# Patient Record
Sex: Female | Born: 1945
Health system: Southern US, Community
[De-identification: ages and names within clinical notes are randomized; demographics above are authoritative.]

## PROBLEM LIST (undated history)

## (undated) DIAGNOSIS — M8580 Other specified disorders of bone density and structure, unspecified site: Secondary | ICD-10-CM

## (undated) DIAGNOSIS — R51 Headache: Secondary | ICD-10-CM

## (undated) DIAGNOSIS — M545 Low back pain, unspecified: Secondary | ICD-10-CM

## (undated) DIAGNOSIS — F419 Anxiety disorder, unspecified: Secondary | ICD-10-CM

## (undated) DIAGNOSIS — M1990 Unspecified osteoarthritis, unspecified site: Secondary | ICD-10-CM

## (undated) DIAGNOSIS — E039 Hypothyroidism, unspecified: Secondary | ICD-10-CM

## (undated) DIAGNOSIS — I509 Heart failure, unspecified: Secondary | ICD-10-CM

## (undated) DIAGNOSIS — G8929 Other chronic pain: Secondary | ICD-10-CM

## (undated) DIAGNOSIS — G4730 Sleep apnea, unspecified: Secondary | ICD-10-CM

## (undated) DIAGNOSIS — G4700 Insomnia, unspecified: Secondary | ICD-10-CM

## (undated) DIAGNOSIS — G43909 Migraine, unspecified, not intractable, without status migrainosus: Secondary | ICD-10-CM

## (undated) DIAGNOSIS — R413 Other amnesia: Secondary | ICD-10-CM

## (undated) DIAGNOSIS — Z8719 Personal history of other diseases of the digestive system: Secondary | ICD-10-CM

## (undated) DIAGNOSIS — Z8489 Family history of other specified conditions: Secondary | ICD-10-CM

## (undated) DIAGNOSIS — M549 Dorsalgia, unspecified: Secondary | ICD-10-CM

## (undated) DIAGNOSIS — J45909 Unspecified asthma, uncomplicated: Secondary | ICD-10-CM

## (undated) DIAGNOSIS — F99 Mental disorder, not otherwise specified: Secondary | ICD-10-CM

## (undated) DIAGNOSIS — N200 Calculus of kidney: Secondary | ICD-10-CM

## (undated) DIAGNOSIS — E785 Hyperlipidemia, unspecified: Secondary | ICD-10-CM

## (undated) DIAGNOSIS — E119 Type 2 diabetes mellitus without complications: Secondary | ICD-10-CM

## (undated) DIAGNOSIS — I10 Essential (primary) hypertension: Secondary | ICD-10-CM

## (undated) DIAGNOSIS — G14 Postpolio syndrome: Secondary | ICD-10-CM

## (undated) DIAGNOSIS — I209 Angina pectoris, unspecified: Secondary | ICD-10-CM

## (undated) DIAGNOSIS — K219 Gastro-esophageal reflux disease without esophagitis: Secondary | ICD-10-CM

## (undated) DIAGNOSIS — I5181 Takotsubo syndrome: Secondary | ICD-10-CM

## (undated) HISTORY — DX: Takotsubo syndrome: I51.81

## (undated) HISTORY — PX: TUBAL LIGATION: SHX77

## (undated) HISTORY — DX: Other amnesia: R41.3

## (undated) HISTORY — PX: CARPAL TUNNEL RELEASE: SHX101

## (undated) HISTORY — PX: COLONOSCOPY: SHX174

## (undated) HISTORY — PX: WISDOM TOOTH EXTRACTION: SHX21

## (undated) HISTORY — PX: KNEE ARTHROSCOPY: SUR90

## (undated) HISTORY — DX: Angina pectoris, unspecified: I20.9

## (undated) HISTORY — PX: ABDOMINAL HYSTERECTOMY: SHX81

## (undated) HISTORY — PX: WRIST TENDON TRANSFER: SHX2676

## (undated) HISTORY — PX: APPENDECTOMY: SHX54

## (undated) HISTORY — PX: CHOLECYSTECTOMY: SHX55

## (undated) HISTORY — PX: KNEE SURGERY: SHX244

## (undated) HISTORY — PX: CYSTOSCOPY: SUR368

## (undated) HISTORY — PX: LEG SURGERY: SHX1003

---

## 1997-09-03 ENCOUNTER — Encounter: Admission: RE | Admit: 1997-09-03 | Discharge: 1997-12-02 | Payer: Self-pay | Admitting: Internal Medicine

## 1998-04-06 ENCOUNTER — Ambulatory Visit (HOSPITAL_COMMUNITY): Admission: RE | Admit: 1998-04-06 | Discharge: 1998-04-06 | Payer: Self-pay

## 1998-09-28 ENCOUNTER — Ambulatory Visit (HOSPITAL_COMMUNITY): Admission: RE | Admit: 1998-09-28 | Discharge: 1998-09-28 | Payer: Self-pay | Admitting: Orthopedic Surgery

## 1998-09-28 ENCOUNTER — Encounter: Payer: Self-pay | Admitting: Orthopedic Surgery

## 1998-10-12 ENCOUNTER — Encounter: Payer: Self-pay | Admitting: Orthopedic Surgery

## 1998-10-12 ENCOUNTER — Ambulatory Visit (HOSPITAL_COMMUNITY): Admission: RE | Admit: 1998-10-12 | Discharge: 1998-10-12 | Payer: Self-pay | Admitting: Orthopedic Surgery

## 1998-10-27 ENCOUNTER — Encounter: Payer: Self-pay | Admitting: Orthopedic Surgery

## 1998-10-27 ENCOUNTER — Ambulatory Visit (HOSPITAL_COMMUNITY): Admission: RE | Admit: 1998-10-27 | Discharge: 1998-10-27 | Payer: Self-pay | Admitting: Orthopedic Surgery

## 1998-12-27 ENCOUNTER — Encounter (INDEPENDENT_AMBULATORY_CARE_PROVIDER_SITE_OTHER): Payer: Self-pay

## 1998-12-27 ENCOUNTER — Ambulatory Visit (HOSPITAL_COMMUNITY): Admission: RE | Admit: 1998-12-27 | Discharge: 1998-12-27 | Payer: Self-pay | Admitting: *Deleted

## 1999-02-16 ENCOUNTER — Emergency Department (HOSPITAL_COMMUNITY): Admission: EM | Admit: 1999-02-16 | Discharge: 1999-02-16 | Payer: Self-pay | Admitting: Internal Medicine

## 1999-03-24 ENCOUNTER — Ambulatory Visit (HOSPITAL_COMMUNITY): Admission: RE | Admit: 1999-03-24 | Discharge: 1999-03-24 | Payer: Self-pay | Admitting: *Deleted

## 1999-03-28 ENCOUNTER — Inpatient Hospital Stay (HOSPITAL_COMMUNITY): Admission: EM | Admit: 1999-03-28 | Discharge: 1999-04-01 | Payer: Self-pay | Admitting: Orthopedic Surgery

## 1999-03-30 ENCOUNTER — Encounter: Payer: Self-pay | Admitting: Orthopedic Surgery

## 1999-12-16 ENCOUNTER — Encounter: Admission: RE | Admit: 1999-12-16 | Discharge: 1999-12-16 | Payer: Self-pay | Admitting: Urology

## 1999-12-16 ENCOUNTER — Encounter: Payer: Self-pay | Admitting: Urology

## 2000-01-20 ENCOUNTER — Encounter: Payer: Self-pay | Admitting: Internal Medicine

## 2000-01-20 ENCOUNTER — Encounter: Admission: RE | Admit: 2000-01-20 | Discharge: 2000-01-20 | Payer: Self-pay | Admitting: Internal Medicine

## 2000-03-12 ENCOUNTER — Encounter: Payer: Self-pay | Admitting: Emergency Medicine

## 2000-03-12 ENCOUNTER — Emergency Department (HOSPITAL_COMMUNITY): Admission: EM | Admit: 2000-03-12 | Discharge: 2000-03-12 | Payer: Self-pay | Admitting: Emergency Medicine

## 2001-01-03 ENCOUNTER — Encounter: Admission: RE | Admit: 2001-01-03 | Discharge: 2001-01-03 | Payer: Self-pay | Admitting: Internal Medicine

## 2001-01-03 ENCOUNTER — Encounter: Payer: Self-pay | Admitting: Internal Medicine

## 2001-03-29 ENCOUNTER — Emergency Department (HOSPITAL_COMMUNITY): Admission: EM | Admit: 2001-03-29 | Discharge: 2001-03-29 | Payer: Self-pay | Admitting: Emergency Medicine

## 2001-03-29 ENCOUNTER — Encounter: Payer: Self-pay | Admitting: Emergency Medicine

## 2001-06-01 ENCOUNTER — Encounter: Payer: Self-pay | Admitting: Emergency Medicine

## 2001-06-01 ENCOUNTER — Emergency Department (HOSPITAL_COMMUNITY): Admission: EM | Admit: 2001-06-01 | Discharge: 2001-06-01 | Payer: Self-pay | Admitting: Emergency Medicine

## 2001-09-04 ENCOUNTER — Emergency Department (HOSPITAL_COMMUNITY): Admission: EM | Admit: 2001-09-04 | Discharge: 2001-09-05 | Payer: Self-pay | Admitting: Emergency Medicine

## 2001-09-05 ENCOUNTER — Encounter: Payer: Self-pay | Admitting: Emergency Medicine

## 2002-01-07 ENCOUNTER — Encounter: Admission: RE | Admit: 2002-01-07 | Discharge: 2002-01-07 | Payer: Self-pay | Admitting: Internal Medicine

## 2002-01-07 ENCOUNTER — Encounter: Payer: Self-pay | Admitting: Internal Medicine

## 2002-11-25 ENCOUNTER — Encounter: Payer: Self-pay | Admitting: Internal Medicine

## 2002-11-25 ENCOUNTER — Encounter: Admission: RE | Admit: 2002-11-25 | Discharge: 2002-11-25 | Payer: Self-pay | Admitting: Internal Medicine

## 2003-02-06 ENCOUNTER — Ambulatory Visit (HOSPITAL_COMMUNITY): Admission: RE | Admit: 2003-02-06 | Discharge: 2003-02-06 | Payer: Self-pay | Admitting: *Deleted

## 2003-11-27 ENCOUNTER — Encounter: Admission: RE | Admit: 2003-11-27 | Discharge: 2003-11-27 | Payer: Self-pay | Admitting: Obstetrics and Gynecology

## 2004-10-17 ENCOUNTER — Ambulatory Visit: Payer: Self-pay | Admitting: Hematology and Oncology

## 2004-12-13 ENCOUNTER — Encounter: Admission: RE | Admit: 2004-12-13 | Discharge: 2004-12-13 | Payer: Self-pay | Admitting: Obstetrics and Gynecology

## 2004-12-19 ENCOUNTER — Encounter: Admission: RE | Admit: 2004-12-19 | Discharge: 2004-12-19 | Payer: Self-pay | Admitting: Orthopedic Surgery

## 2004-12-25 ENCOUNTER — Encounter: Admission: RE | Admit: 2004-12-25 | Discharge: 2004-12-25 | Payer: Self-pay | Admitting: Orthopedic Surgery

## 2006-01-16 ENCOUNTER — Encounter: Admission: RE | Admit: 2006-01-16 | Discharge: 2006-01-16 | Payer: Self-pay | Admitting: Obstetrics and Gynecology

## 2006-03-13 ENCOUNTER — Encounter: Admission: RE | Admit: 2006-03-13 | Discharge: 2006-03-13 | Payer: Self-pay | Admitting: Orthopedic Surgery

## 2007-02-04 ENCOUNTER — Encounter: Admission: RE | Admit: 2007-02-04 | Discharge: 2007-02-04 | Payer: Self-pay | Admitting: Obstetrics and Gynecology

## 2007-09-27 ENCOUNTER — Encounter: Admission: RE | Admit: 2007-09-27 | Discharge: 2007-09-27 | Payer: Self-pay | Admitting: Orthopedic Surgery

## 2007-10-29 ENCOUNTER — Encounter: Admission: RE | Admit: 2007-10-29 | Discharge: 2007-10-29 | Payer: Self-pay | Admitting: Orthopedic Surgery

## 2008-02-13 ENCOUNTER — Encounter: Admission: RE | Admit: 2008-02-13 | Discharge: 2008-02-13 | Payer: Self-pay | Admitting: Obstetrics and Gynecology

## 2008-07-18 ENCOUNTER — Inpatient Hospital Stay (HOSPITAL_COMMUNITY): Admission: AD | Admit: 2008-07-18 | Discharge: 2008-07-18 | Payer: Self-pay | Admitting: Obstetrics and Gynecology

## 2009-02-04 ENCOUNTER — Inpatient Hospital Stay (HOSPITAL_COMMUNITY): Admission: EM | Admit: 2009-02-04 | Discharge: 2009-02-06 | Payer: Self-pay | Admitting: Emergency Medicine

## 2009-02-04 ENCOUNTER — Ambulatory Visit: Payer: Self-pay | Admitting: Cardiology

## 2009-02-08 ENCOUNTER — Telehealth: Payer: Self-pay | Admitting: Cardiology

## 2009-02-15 ENCOUNTER — Encounter: Admission: RE | Admit: 2009-02-15 | Discharge: 2009-02-15 | Payer: Self-pay | Admitting: Obstetrics and Gynecology

## 2009-03-12 ENCOUNTER — Ambulatory Visit: Payer: Self-pay | Admitting: Cardiology

## 2009-03-12 DIAGNOSIS — I10 Essential (primary) hypertension: Secondary | ICD-10-CM | POA: Insufficient documentation

## 2009-03-12 DIAGNOSIS — E119 Type 2 diabetes mellitus without complications: Secondary | ICD-10-CM | POA: Insufficient documentation

## 2009-03-12 DIAGNOSIS — I4891 Unspecified atrial fibrillation: Secondary | ICD-10-CM | POA: Insufficient documentation

## 2009-03-12 DIAGNOSIS — E039 Hypothyroidism, unspecified: Secondary | ICD-10-CM | POA: Insufficient documentation

## 2009-03-12 DIAGNOSIS — I5181 Takotsubo syndrome: Secondary | ICD-10-CM | POA: Insufficient documentation

## 2009-03-12 DIAGNOSIS — R609 Edema, unspecified: Secondary | ICD-10-CM | POA: Insufficient documentation

## 2009-03-15 ENCOUNTER — Encounter: Admission: RE | Admit: 2009-03-15 | Discharge: 2009-03-15 | Payer: Self-pay | Admitting: Gastroenterology

## 2009-03-23 ENCOUNTER — Encounter: Payer: Self-pay | Admitting: Cardiology

## 2009-04-14 ENCOUNTER — Telehealth: Payer: Self-pay | Admitting: Cardiology

## 2009-04-26 ENCOUNTER — Encounter: Payer: Self-pay | Admitting: Cardiology

## 2009-05-03 ENCOUNTER — Ambulatory Visit: Payer: Self-pay | Admitting: Cardiology

## 2009-07-08 ENCOUNTER — Telehealth (INDEPENDENT_AMBULATORY_CARE_PROVIDER_SITE_OTHER): Payer: Self-pay | Admitting: *Deleted

## 2009-08-02 ENCOUNTER — Ambulatory Visit: Payer: Self-pay | Admitting: Cardiology

## 2010-01-24 ENCOUNTER — Ambulatory Visit: Payer: Self-pay | Admitting: Cardiology

## 2010-01-24 ENCOUNTER — Encounter: Payer: Self-pay | Admitting: Cardiology

## 2010-01-24 ENCOUNTER — Emergency Department (HOSPITAL_COMMUNITY): Admission: EM | Admit: 2010-01-24 | Discharge: 2010-01-24 | Payer: Self-pay | Admitting: Emergency Medicine

## 2010-01-31 ENCOUNTER — Ambulatory Visit: Payer: Self-pay | Admitting: Internal Medicine

## 2010-01-31 ENCOUNTER — Encounter: Payer: Self-pay | Admitting: Cardiology

## 2010-01-31 ENCOUNTER — Ambulatory Visit: Payer: Self-pay

## 2010-01-31 ENCOUNTER — Ambulatory Visit: Payer: Self-pay | Admitting: Cardiology

## 2010-01-31 ENCOUNTER — Ambulatory Visit (HOSPITAL_COMMUNITY): Admission: RE | Admit: 2010-01-31 | Discharge: 2010-01-31 | Payer: Self-pay | Admitting: Cardiology

## 2010-02-24 ENCOUNTER — Encounter: Admission: RE | Admit: 2010-02-24 | Discharge: 2010-02-24 | Payer: Self-pay | Admitting: Obstetrics and Gynecology

## 2010-03-31 ENCOUNTER — Telehealth: Payer: Self-pay | Admitting: Cardiology

## 2010-06-04 ENCOUNTER — Encounter: Payer: Self-pay | Admitting: Obstetrics and Gynecology

## 2010-06-04 ENCOUNTER — Inpatient Hospital Stay (HOSPITAL_COMMUNITY)
Admission: EM | Admit: 2010-06-04 | Discharge: 2010-06-13 | Payer: Self-pay | Source: Home / Self Care | Attending: Internal Medicine | Admitting: Internal Medicine

## 2010-06-05 ENCOUNTER — Encounter: Payer: Self-pay | Admitting: Orthopedic Surgery

## 2010-06-05 ENCOUNTER — Encounter: Payer: Self-pay | Admitting: Obstetrics and Gynecology

## 2010-06-07 LAB — URINALYSIS, ROUTINE W REFLEX MICROSCOPIC
Ketones, ur: NEGATIVE mg/dL
Specific Gravity, Urine: 1.007 (ref 1.005–1.030)
Urine Glucose, Fasting: NEGATIVE mg/dL
Urobilinogen, UA: 0.2 mg/dL (ref 0.0–1.0)

## 2010-06-07 LAB — GLUCOSE, CAPILLARY
Glucose-Capillary: 116 mg/dL — ABNORMAL HIGH (ref 70–99)
Glucose-Capillary: 121 mg/dL — ABNORMAL HIGH (ref 70–99)
Glucose-Capillary: 135 mg/dL — ABNORMAL HIGH (ref 70–99)
Glucose-Capillary: 144 mg/dL — ABNORMAL HIGH (ref 70–99)
Glucose-Capillary: 126 mg/dL — ABNORMAL HIGH (ref 70–99)
Glucose-Capillary: 142 mg/dL — ABNORMAL HIGH (ref 70–99)
Glucose-Capillary: 154 mg/dL — ABNORMAL HIGH (ref 70–99)

## 2010-06-07 LAB — COMPREHENSIVE METABOLIC PANEL
ALT: 22 U/L (ref 0–35)
ALT: 22 U/L (ref 0–35)
AST: 25 U/L (ref 0–37)
Albumin: 2.3 g/dL — ABNORMAL LOW (ref 3.5–5.2)
Alkaline Phosphatase: 50 U/L (ref 39–117)
Calcium: 8.2 mg/dL — ABNORMAL LOW (ref 8.4–10.5)
Creatinine, Ser: 0.89 mg/dL (ref 0.4–1.2)
Creatinine, Ser: 0.44 mg/dL (ref 0.4–1.2)
GFR calc Af Amer: >60 mL/min (ref >60)
GFR calc Af Amer: >60 mL/min (ref >60)
GFR calc non Af Amer: >60 mL/min (ref >60)
Potassium: 3.8 mEq/L (ref 3.5–5.1)
Sodium: 132 mEq/L — ABNORMAL LOW (ref 135–145)
Sodium: 138 mEq/L (ref 135–145)
Total Bilirubin: 1.3 mg/dL — ABNORMAL HIGH (ref 0.3–1.2)
Total Protein: 5 g/dL — ABNORMAL LOW (ref 6.0–8.3)

## 2010-06-07 LAB — CBC
HCT: 30 % — ABNORMAL LOW (ref 36.0–46.0)
Hemoglobin: 10.4 g/dL — ABNORMAL LOW (ref 12.0–15.0)
MCH: 30.5 pg (ref 26.0–34.0)
MCH: 30.1 pg (ref 26.0–34.0)
MCHC: 33.6 g/dL (ref 30.0–36.0)
MCHC: 33.3 g/dL (ref 30.0–36.0)
Platelets: 304 10*3/uL (ref 150–400)
Platelets: 278 10*3/uL (ref 150–400)
Platelets: 282 10*3/uL (ref 150–400)
RBC: 3.41 MIL/uL — ABNORMAL LOW (ref 3.87–5.11)
RDW: 13.7 % (ref 11.5–15.5)
RDW: 13.9 % (ref 11.5–15.5)
WBC: 5.7 10*3/uL (ref 4.0–10.5)

## 2010-06-07 LAB — CARDIAC PANEL(CRET KIN+CKTOT+MB+TROPI)
CK, MB: 16 ng/mL (ref 0.3–4.0)
CK, MB: 3.6 ng/mL (ref 0.3–4.0)
CK, MB: 7.5 ng/mL (ref 0.3–4.0)
CK, MB: 1 ng/mL (ref 0.3–4.0)
CK, MB: 1.2 ng/mL (ref 0.3–4.0)
Relative Index: 1.5 (ref 0.0–2.5)
Relative Index: 0.5 (ref 0.0–2.5)
Relative Index: 0.6 (ref 0.0–2.5)
Relative Index: 0.6 (ref 0.0–2.5)
Total CK: 1036 U/L — ABNORMAL HIGH (ref 7–177)
Total CK: 568 U/L — ABNORMAL HIGH (ref 7–177)
Total CK: 801 U/L — ABNORMAL HIGH (ref 7–177)
Total CK: 179 U/L — ABNORMAL HIGH (ref 7–177)
Total CK: 228 U/L — ABNORMAL HIGH (ref 7–177)
Troponin I: 0.03 ng/mL (ref 0.00–0.06)
Troponin I: 0.02 ng/mL (ref 0.00–0.06)

## 2010-06-07 LAB — MAGNESIUM: Magnesium: 1.8 mg/dL (ref 1.5–2.5)

## 2010-06-07 LAB — RAPID URINE DRUG SCREEN, HOSP PERFORMED
Barbiturates: NOT DETECTED
Tetrahydrocannabinol: NOT DETECTED

## 2010-06-07 LAB — BASIC METABOLIC PANEL
BUN: 6 mg/dL (ref 6–23)
Calcium: 8 mg/dL — ABNORMAL LOW (ref 8.4–10.5)
GFR calc non Af Amer: >60 mL/min (ref >60)
Glucose, Bld: 126 mg/dL — ABNORMAL HIGH (ref 70–99)
Potassium: 3.4 mEq/L — ABNORMAL LOW (ref 3.5–5.1)
Sodium: 138 mEq/L (ref 135–145)

## 2010-06-07 LAB — DIFFERENTIAL
Basophils Absolute: 0 10*3/uL (ref 0.0–0.1)
Basophils Relative: 0 % (ref 0–1)
Eosinophils Relative: 4 % (ref 0–5)
Monocytes Relative: 15 % — ABNORMAL HIGH (ref 3–12)
Neutrophils Relative %: 64 % (ref 43–77)

## 2010-06-07 LAB — HEMOGLOBIN A1C: Mean Plasma Glucose: 103 mg/dL (ref <117)

## 2010-06-07 LAB — ETHANOL: Alcohol, Ethyl (B): 5 mg/dL (ref 0–10)

## 2010-06-07 LAB — PHOSPHORUS: Phosphorus: 2.1 mg/dL — ABNORMAL LOW (ref 2.3–4.6)

## 2010-06-08 LAB — PROTIME-INR: INR: 1.17 (ref 0.00–1.49)

## 2010-06-08 LAB — GLUCOSE, CAPILLARY
Glucose-Capillary: 129 mg/dL — ABNORMAL HIGH (ref 70–99)
Glucose-Capillary: 120 mg/dL — ABNORMAL HIGH (ref 70–99)
Glucose-Capillary: 119 mg/dL — ABNORMAL HIGH (ref 70–99)
Glucose-Capillary: 100 mg/dL — ABNORMAL HIGH (ref 70–99)
Glucose-Capillary: 142 mg/dL — ABNORMAL HIGH (ref 70–99)
Glucose-Capillary: 100 mg/dL — ABNORMAL HIGH (ref 70–99)
Glucose-Capillary: 131 mg/dL — ABNORMAL HIGH (ref 70–99)
Glucose-Capillary: 96 mg/dL (ref 70–99)

## 2010-06-08 LAB — CBC
HCT: 28.7 % — ABNORMAL LOW (ref 36.0–46.0)
MCHC: 33.1 g/dL (ref 30.0–36.0)
MCV: 91.1 fL (ref 78.0–100.0)
Platelets: 330 10*3/uL (ref 150–400)
RDW: 14 % (ref 11.5–15.5)

## 2010-06-08 LAB — MRSA PCR SCREENING: MRSA by PCR: NEGATIVE

## 2010-06-08 LAB — COMPREHENSIVE METABOLIC PANEL
Albumin: 2.3 g/dL — ABNORMAL LOW (ref 3.5–5.2)
BUN: 5 mg/dL — ABNORMAL LOW (ref 6–23)
Calcium: 8.4 mg/dL (ref 8.4–10.5)
Glucose, Bld: 111 mg/dL — ABNORMAL HIGH (ref 70–99)
Sodium: 138 mEq/L (ref 135–145)
Total Protein: 5.1 g/dL — ABNORMAL LOW (ref 6.0–8.3)

## 2010-06-08 LAB — BASIC METABOLIC PANEL
CO2: 26 mEq/L (ref 19–32)
Chloride: 109 mEq/L (ref 96–112)
Creatinine, Ser: 0.47 mg/dL (ref 0.4–1.2)
GFR calc Af Amer: >60 mL/min (ref >60)
Potassium: 4.1 mEq/L (ref 3.5–5.1)

## 2010-06-08 LAB — MAGNESIUM: Magnesium: 1.6 mg/dL (ref 1.5–2.5)

## 2010-06-09 LAB — GLUCOSE, CAPILLARY
Glucose-Capillary: 101 mg/dL — ABNORMAL HIGH (ref 70–99)
Glucose-Capillary: 114 mg/dL — ABNORMAL HIGH (ref 70–99)
Glucose-Capillary: 116 mg/dL — ABNORMAL HIGH (ref 70–99)
Glucose-Capillary: 124 mg/dL — ABNORMAL HIGH (ref 70–99)
Glucose-Capillary: 97 mg/dL (ref 70–99)

## 2010-06-09 NOTE — Consult Note (Addendum)
[name], [name] NO.:  [id number]  MEDICAL RECORD NO.:  [id number]          PATIENT TYPE:  INP  LOCATION:  1402                         FACILITY:  [facility]  PHYSICIAN:  Rollene Rotunda, MD, FACCDATE OF BIRTH:  1945/10/11  DATE OF CONSULTATION: DATE OF DISCHARGE:                                CONSULTATION   REQUESTING PHYSICIAN:  Troy Sine. Dwain Sarna, M.D.  REASON FOR CONSULTATION:  Preoperative.  There is a question of ventricular tachycardia.  She had previous cardiomyopathy.  HISTORY OF PRESENT ILLNESS:  The patient is a pleasant, 65 year old who I have seen in the past for a cardiomyopathy.  She has had an ejection fraction of about 45% documented previously.  She had a nonobstructive coronary disease in September 2010.  Most recent echo done outside of this hospitalization in September of last year demonstrated the EF to be back to normal.  She was admitted with altered mental status probably related to narcotics she was taking for dental extraction.  She was also somewhat dehydrated.  She had a fall and was actually run over by her own wheelchair.  On presentation, she has also been noted to have signs and symptoms consistent with cholecystitis.  A CT and ultrasound have confirmed this.  She is to have cholecystectomy.  However, on telemetry, the patient was felt to have ventricular tachycardia.  I reviewed all available alarm and active telemetry strips.  However, it is clear that what was thought to be ventricular tachycardia was actually artifact. She has had sinus rhythm or sinus brady throughout with no arrhythmias. Of note, the patient did have elevated CK but with a normal troponin and negative MB ratio during this hospitalization.  This is consistent with muscular trauma.  She has also had another echocardiogram demonstrating a well-preserved ejection fraction and no significant abnormalities.  The patient gets around in a wheelchair because  of polio.  She has had no recent cardiovascular symptoms.  She denies any chest pressure, neck or arm discomfort.  She has had no palpitations, presyncope or syncope. She has had no PND or orthopnea.  She is currently complaining of some neck discomfort, but this is her right posterior neck and there is a point of tenderness and some discomfort with moving.  She did report a fall out of her wheelchair recently.  PAST MEDICAL HISTORY:  Takotsubo cardiomyopathy, resolved, hypertension, diabetes mellitus, nonobstructive coronary disease (40% LAD stenosis, catheterization September 2010), hypothyroidism, polio.  PAST SURGICAL HISTORY:  Hysterectomy, appendectomy.  ALLERGIES:  Intolerances ACETAMINOPHEN, NONSTEROIDALS, SOMA, COX-2 INHIBITORS, CIPROFLOXACIN.  MEDICATIONS:  (Prior to admission), Dexilant, Synthroid 80 mcg daily, hydrocodone, metoprolol 12.5 mg daily, metformin 500 mg b.i.d., losartan 50 mg daily, vitamin D, Percocet, tizanidine, vitamin B12, clonazepam, citalopram, Benadryl, Ambien, furosemide 20 mg daily, fish oil, multivitamins. SOCIAL HISTORY:  The patient is married.  She currently does not smoke cigarettes or drink alcohol.  FAMILY HISTORY:  Contributory for her father having coronary disease in his 29s and a brother with aortic valve replacement bypass in his 87s.  REVIEW OF SYSTEMS:  As stated in the HPI and positive for chronic  back pain.  Negative for all other systems.  PHYSICAL EXAMINATION:  The patient is pleasant and in no distress. Blood pressure 125/74, heart rate 63, respiratory rate 18, 95% saturation on room air, afebrile. HEENT:  Eyes are unremarkable, pupils are equal, round, and react to light, fundi not visualized, oral mucosa unremarkable. NECK:  No jugular distention at 45 degrees, carotid upstroke brisk and symmetrical.  No bruits, no thyromegaly.  LYMPHATICS:  No cervical, axillary or inguinal lymphadenopathy. LUNGS:  Clear to auscultation  bilaterally. BACK:  No costovertebral tenderness. CHEST:  Unremarkable. HEART:  PMI not displaced or sustained, S1-S2 within normal limits, no S3, no S4, no clicks, no rubs, no murmurs. ABDOMEN:  Flat, positive bowel sounds normal in frequency and pitch, no bruits, no rebound, no guarding, mild tenderness to palpation in the right upper quadrant. SKIN:  No rashes, no nodules. EXTREMITIES:  2+ pulses, mild edema in lower extremities. NEURO:  Oriented to person, place, and time, cranial nerves grossly intact, motor grossly intact in upper, paralysis in lower extremities bilaterally.  DIAGNOSTIC STUDIES:  EKG on January 23 sinus bradycardia, rate 51, low voltage in the limb leads and chest leads, axis within normal limits, intervals within normal limits, poor anterior R-wave progression, no acute ST-T wave changes.  LABORATORY DATA:  TSH 3.87, phosphorus 2.9, magnesium 1.5, CK 130. Sodium 38, potassium 4.3, BUN 5, creatinine 0.44, WBC 6.2, hemoglobin 9.5, platelets 330.  ASSESSMENT/PLAN: 1. Preoperative clearance.  The patient is at acceptable risk for the     planned surgery.  She has had no new symptoms since her most recent     cardiac evaluation, which was a catheterization in 2010.  Her     ejection fraction has improved.  There are no high-risk findings.     Given this, according to ACC/AHA guidelines, no further evaluation     is indicated prior to the indicated surgery. 2. Cardiomyopathy.  This has not improved to a normal ejection     fraction.  No change in therapy is indicated. 3. Questionable arrhythmia. The telemetry strip that was labeled as     ventricular tachycardia demonstrates artifact.  There have been no     arrhythmias, and she is not high risk for these.  No further     therapy is warranted.     Rollene Rotunda, MD, [facility]     JH/MEDQ  D:  06/07/2010  T:  06/07/2010  Job:  [id number]  Electronically Signed by Rollene Rotunda MD [facility] on 06/09/2010 12:41:36  PM

## 2010-06-10 LAB — COMPREHENSIVE METABOLIC PANEL
ALT: 22 U/L (ref 0–35)
AST: 13 U/L (ref 0–37)
Albumin: 2.4 g/dL — ABNORMAL LOW (ref 3.5–5.2)
Alkaline Phosphatase: 56 U/L (ref 39–117)
Chloride: 110 mEq/L (ref 96–112)
GFR calc Af Amer: >60 mL/min (ref >60)
Potassium: 3.8 mEq/L (ref 3.5–5.1)
Total Bilirubin: 0.5 mg/dL (ref 0.3–1.2)

## 2010-06-10 LAB — CBC
MCV: 90.9 fL (ref 78.0–100.0)
Platelets: 395 10*3/uL (ref 150–400)
RBC: 3.4 MIL/uL — ABNORMAL LOW (ref 3.87–5.11)
WBC: 8.6 10*3/uL (ref 4.0–10.5)

## 2010-06-10 LAB — GLUCOSE, CAPILLARY
Glucose-Capillary: 132 mg/dL — ABNORMAL HIGH (ref 70–99)
Glucose-Capillary: 127 mg/dL — ABNORMAL HIGH (ref 70–99)

## 2010-06-11 LAB — BASIC METABOLIC PANEL
BUN: 5 mg/dL — ABNORMAL LOW (ref 6–23)
GFR calc non Af Amer: >60 mL/min (ref >60)
Potassium: 4.1 mEq/L (ref 3.5–5.1)
Sodium: 140 mEq/L (ref 135–145)

## 2010-06-11 LAB — GLUCOSE, CAPILLARY
Glucose-Capillary: 87 mg/dL (ref 70–99)
Glucose-Capillary: 117 mg/dL — ABNORMAL HIGH (ref 70–99)

## 2010-06-11 LAB — MAGNESIUM: Magnesium: 1.5 mg/dL (ref 1.5–2.5)

## 2010-06-12 LAB — GLUCOSE, CAPILLARY
Glucose-Capillary: 137 mg/dL — ABNORMAL HIGH (ref 70–99)
Glucose-Capillary: 90 mg/dL (ref 70–99)

## 2010-06-12 LAB — BASIC METABOLIC PANEL
CO2: 29 mEq/L (ref 19–32)
Chloride: 105 mEq/L (ref 96–112)
Creatinine, Ser: 0.49 mg/dL (ref 0.4–1.2)
GFR calc Af Amer: >60 mL/min (ref >60)
Potassium: 4 mEq/L (ref 3.5–5.1)

## 2010-06-13 LAB — GLUCOSE, CAPILLARY
Glucose-Capillary: 145 mg/dL — ABNORMAL HIGH (ref 70–99)
Glucose-Capillary: 107 mg/dL — ABNORMAL HIGH (ref 70–99)

## 2010-06-13 NOTE — Op Note (Signed)
[name], [name] NO.:  [id number]  MEDICAL RECORD NO.:  [id number]          PATIENT TYPE:  INP  LOCATION:  1402                         FACILITY:  [facility]  PHYSICIAN:  Juanetta Gosling, MDDATE OF BIRTH:  [birth year]  DATE OF PROCEDURE: DATE OF DISCHARGE:                              OPERATIVE REPORT   PREOPERATIVE DIAGNOSIS:  Chronic cholecystitis.  POSTOPERATIVE DIAGNOSIS:  Acute on chronic cholecystitis.  PROCEDURE:  Laparoscopic cholecystectomy with cholangiogram.  SURGEON:  Juanetta Gosling, MD  ASSISTANT:  Letha Cape, PA.  ANESTHESIA:  General.  SPECIMENS:  Gallbladder contents to pathology.  ESTIMATED BLOOD LOSS:  80 mL.  COMPLICATIONS:  None.  DRAINS:  None.  DISPOSITION:  To recovery room in stable condition.  INDICATIONS:  This is a 65 year old female who was admitted by Dr. Bertram Savin over the weekend after she had a fall as well as some confusion. She did complain of some abdominal pain and nausea, which prompted a CT abdomen and pelvis, which showed possible acute cholecystitis.  This was followed up by an abdominal ultrasound showing sludge and some gallbladder wall thickening.  Clinically, she had cholecystitis on my examination as well.  Prior to taking to the operating room yesterday, she had a questionable run of ventricular tachycardia.  She had been evaluated by Cardiology and this was deemed to be an artifact and there is no reason to hold her up for surgery at this point.  She and I and her family discussed laparoscopic cholecystectomy along with the risks and benefits associated with that procedure.  After informed consent, the patient was taken to the operating room.  She was on Zosyn on the floor, already received this preoperatively.  Sequential compression devices were placed on lower extremities prior to induction of anesthesia.  She was then placed under general endotracheal anesthesia without  complication.  Her abdomen was then prepped and draped in standard sterile surgical fashion.  Surgical time-out was then performed.  I infiltrated 0.5% Marcaine below her umbilicus.  I made a vertical incision, carried this down to her fascia.  This was entered sharply. Peritoneum was entered bluntly.  An 0 Vicryl pursestring suture was placed through the fascia.  Hasson trocar was introduced and the abdomen was then insufflated to 15 mmHg pressure.  Three further 5-mm trocars were placed in the epigastrium and right upper quadrant under direct vision after infiltration with local anesthetic without complication. The gallbladder was noted to be adherent to the duodenum and the omentum.  The adhesions were taken down bluntly.  She very clearly had what appeared to be acute on chronic cholecystitis.  With some difficulty, gallbladder was retracted cephalad lateral.  It was quite difficult to actually dissect the triangle of Calot.  I then was able to obtain what I thought was a critical view of safety.  I did clip the artery and divided that.  I was unsure of exactly my location in the cystic duct and elected to perform a cholangiogram.  I clipped this distally, introduced a Cook catheter in the abdomen and made a ductotomy.  I milked some  hazy-looking fluid from the duct.  I was able to place the catheter in the duct and did a cholangiogram that showed my presence in the cystic duct.  She had a long cystic duct.  There was easy filling of her duodenum.  There was backfilling into her liver and the common bile duct was a little bit dilated but there was no obvious stones present.  Following this, I removed the catheter, clipped the duct three times, and divided this.  I then removed the gallbladder bed from the liver with some difficulty.  I entered the gallbladder and there was leakage of some bile.  There were no stones present.  The top portion of the gallbladder was unable to separate  from the liver and I left a little bit of the back wall of the gallbladder on the liver.  The gallbladder was then removed from the liver, was placed in an EndoCatch bag and removed.  I then bovied the mucosa that I left on the liver as well as obtained hemostasis.  Copious irrigation was performed until this was clear.  I did place a piece of Surgicel snow in the gallbladder bed.  I then removed the Hasson trocar.  I viewed this from the epigastrium, the pursestring was tied down and this obliterated the defect.  I then desufflated the abdomen and removed all trocars.  The trocar sites were all closed with 4-0 Monocryl in subcuticular fashion. Dermabond was placed on them.  She tolerated this well, was extubated in the operating room and transferred to the recovery room in stable condition.     Juanetta Gosling, MD     MCW/MEDQ  D:  06/08/2010  T:  06/08/2010  Job:  [id number]  cc:   Soyla Murphy. Renne Crigler, M.D. Fax: [phone]  Electronically Signed by Emelia Loron MD on 06/13/2010 11:31:25 AM

## 2010-06-14 NOTE — Assessment & Plan Note (Signed)
Summary: per check out/sf   Visit Type:  Follow-up Primary Provider:  Merri Brunette, MD  CC:  Cardiomyopathy.  History of Present Illness: The patient presents for followup of her cardiomyopathy. Since I last saw her she has had no new cardiovascular complaints. The swelling in her leg is much improved. I did increase her lisinopril at the last visit. She has done well with this though I do see some blood pressures in the 90s on her blood pressure diary. She feels fatigued on these days. She is not describing presyncope or syncope. She is not describing chest pressure, neck or arm discomfort. She's had no new palpitations.  Problems Prior to Update: 1)  Edema  (ICD-782.3) 2)  Takotsubo Syndrome  (ICD-429.83) 3)  Atrial Fibrillation  (ICD-427.31) 4)  Hypothyroidism  (ICD-244.9) 5)  Dm  (ICD-250.00) 6)  Hypertension  (ICD-401.9)  Current Medications (verified): 1)  Tizanidine Hcl 4 Mg Tabs (Tizanidine Hcl) .... 5 Daily 2)  Metformin Hcl 500 Mg Tabs (Metformin Hcl) .... 2 Two Times A Day 3)  Simvastatin 20 Mg Tabs (Simvastatin) .... Daily 4)  Zolpidem Tartrate 10 Mg Tabs (Zolpidem Tartrate) .... At Bedtime 5)  Synthroid 88 Mcg Tabs (Levothyroxine Sodium) .... Daily 6)  Lisinopril 10 Mg Tabs (Lisinopril) .... Daily 7)  Dexilant 60 Mg Cpdr (Dexlansoprazole) .... Daily 8)  Triamterene-Hctz 37.5-25 Mg Tabs (Triamterene-Hctz) .... 1/2 Daily 9)  Caltrate 600+d 600-400 Mg-Unit Tabs (Calcium Carbonate-Vitamin D) .... Three Times A Day 10)  Fish Oil 500 Mg Caps (Omega-3 Fatty Acids) .... 4 Daily 11)  Multivitamins   Tabs (Multiple Vitamin) .... Daily 12)  Evening Primrose Oil 500 Mg Caps (Evening Primrose Oil) .... Daily 13)  Grape Seed  Oil (Grape Seed) .... 2 Two Times A Day 14)  Citalopram Hydrobromide 40 Mg Tabs (Citalopram Hydrobromide) .... Daily 15)  Clonazepam 0.5 Mg Tabs (Clonazepam) .... Two Times A Day 16)  Fosamax 70 Mg Tabs (Alendronate Sodium) .... Weekly 17)  Metoprolol  Succinate 25 Mg Xr24h-Tab (Metoprolol Succinate) .... Daily 18)  Furosemide 20 Mg Tabs (Furosemide) .... One By Mouth Qam 19)  Vitamin B-12 100 Mcg Tabs (Cyanocobalamin) .Marland Kitchen.. 1 By Mouth Daily 20)  Vitamin D (Ergocalciferol) 50000 Unit Caps (Ergocalciferol) .Marland Kitchen.. 1 By Mouth Every Month  Allergies: 1)  ! Tylenol 2)  ! * Anti Inflammitory 3)  ! * Sulma Compound 4)  ! * Telectin 5)  ! Darvocet  Past History:  Past Medical History: Reviewed history from 03/12/2009 and no changes required.  1. Hypertension.   2. Diabetes mellitus type 2.   3. Poliomyelitis with right lower extremity paralysis, is wheelchair       bound.   4. Hypothyroidism.   5. Cardiomyopathy (EF 45%)  6. Nonobstructive coronary disease  Past Surgical History: Reviewed history from 03/12/2009 and no changes required.  1. Hysterectomy.   2. Appendectomy.   Review of Systems       As stated in the HPI and negative for all other systems.   Vital Signs:  Patient profile:   65 year old female Height:      60 inches Weight:      180 pounds BMI:     35.28 Pulse rate:   61 / minute BP sitting:   130 / 61  (left arm) Cuff size:   large  Vitals Entered By: Oswald Hillock (August 02, 2009 3:29 PM)  Physical Exam  General:  Well developed, well nourished, in no acute distress. Head:  normocephalic and  atraumatic Neck:  no jugular venous distention at 90 Lungs:  Clear bilaterally to auscultation and percussion. Heart:  S1 and S2 within normal limits, no S3, no S4, no clicks, no rubs, no murmurs Abdomen:  Positive bowel sounds normal in frequency and pH, no rebound or guarding, unable to appreciate organomegaly secondary to the patient being seated in the wheelchair Msk:  normal upper extremity strength, severe with bilateral lower extremity weakness Pulses:  pulses normal in all 4 extremities Extremities:  no edema Neurologic:  Alert and oriented x 3. Psych:  Normal affect.   EKG  Procedure date:   08/02/2009  Findings:      sinus rhythm, rate 61, axis within normal limits, intervals within normal limits, no acute ST-T wave changes.  Impression & Recommendations:  Problem # 1:  TAKOTSUBO SYNDROME (ICD-429.83) The patient has no new symptoms. I cannot up titrate her medications. She will remain on meds as listed. In fact she will hold her beta blocker on days when her blood pressure is low. I plan on repeating an echocardiogram in September at the year anniversary of her diagnosis to see if her ejection fraction has improved.  Problem # 2:  ATRIAL FIBRILLATION (ICD-427.31) She has had no evidence of recurrence of this. No change in therapy is indicated. Orders: EKG w/ Interpretation (93000)  Problem # 3:  HYPERTENSION (ICD-401.9) Her blood pressure is actually low. The meds will be administered as directed above.  Patient Instructions: 1)  Your physician recommends that you schedule a follow-up appointment in: 01/2010 with 2 D Echo for 428.22 2)  Your physician recommends that you continue on your current medications as directed. Please refer to the Current Medication list given to you today. 3)  Your physician has requested that you have an echocardiogram.  Echocardiography is a painless test that uses sound waves to create images of your heart. It provides your doctor with information about the size and shape of your heart and how well your heart's chambers and valves are working.  This procedure takes approximately one hour. There are no restrictions for this procedure.

## 2010-06-14 NOTE — Consult Note (Signed)
Summary: [facility]   [place]   Imported By: Roderic Ovens 03/09/2010 12:32:31  _____________________________________________________________________  External Attachment:    Type:   Image     Comment:   External Document

## 2010-06-14 NOTE — Assessment & Plan Note (Signed)
Summary: 6 month rov need echo aslo   Visit Type:  Follow-up Primary Provider:  Merri Brunette, MD  CC:  Cardiomyopathy.  History of Present Illness: The patient presents for followup of her mild cardiomyopathy demonstrated on catheterization last year. She's had lower extremity edema but we've treated this conservatively with a low dose of diuretic and she has done a great job keeping her feet elevated. She was in the emergency room on the 12th of this month with shoulder back and arm pain. She was seen in consultation by our service but it was felt to be noncardiac. Her BNP was less than 30 and cardiac enzymes were normal. Chest x-ray demonstrated no abnormalities. It was felt to be musculoskeletal. Since that time she's had some short fleeting discomfort across her anterior chest. She has not had any shortness of breath, PND or orthopnea. She has not had any chest pressure, neck or arm discomfort consistent with angina. She's had no palpitations, presyncope or syncope.  Current Medications (verified): 1)  Tizanidine Hcl 4 Mg Tabs (Tizanidine Hcl) .... 5 Daily 2)  Metformin Hcl 500 Mg Tabs (Metformin Hcl) .... 2 Two Times A Day 3)  Simvastatin 40 Mg Tabs (Simvastatin) .... 1/2 By Mouth Daily 4)  Zolpidem Tartrate 10 Mg Tabs (Zolpidem Tartrate) .... At Bedtime 5)  Synthroid 88 Mcg Tabs (Levothyroxine Sodium) .... Daily 6)  Lisinopril 10 Mg Tabs (Lisinopril) .... Daily 7)  Dexilant 60 Mg Cpdr (Dexlansoprazole) .... Daily 8)  Triamterene-Hctz 37.5-25 Mg Tabs (Triamterene-Hctz) .... 1/2 Daily 9)  Caltrate 600+d 600-400 Mg-Unit Tabs (Calcium Carbonate-Vitamin D) .... Three Times A Day 10)  Fish Oil 500 Mg Caps (Omega-3 Fatty Acids) .... 4 Daily 11)  Multivitamins   Tabs (Multiple Vitamin) .... Daily 12)  Evening Primrose Oil 500 Mg Caps (Evening Primrose Oil) .... Daily 13)  Grape Seed  Oil (Grape Seed) .... 2 Two Times A Day 14)  Citalopram Hydrobromide 40 Mg Tabs (Citalopram Hydrobromide) ....  Daily 15)  Clonazepam 0.5 Mg Tabs (Clonazepam) .... Two Times A Day 16)  Fosamax 70 Mg Tabs (Alendronate Sodium) .... Weekly 17)  Metoprolol Succinate 25 Mg Xr24h-Tab (Metoprolol Succinate) .... 1/2 By Mouth Daily 18)  Furosemide 20 Mg Tabs (Furosemide) .... One By Mouth Qam 19)  Vitamin B-12 100 Mcg Tabs (Cyanocobalamin) .Marland Kitchen.. 1 By Mouth Daily 20)  Vitamin D (Ergocalciferol) 50000 Unit Caps (Ergocalciferol) .Marland Kitchen.. 1 By Mouth Every Month 21)  Gelnique 10 % Gel (Oxybutynin Chloride) .... As Directed 22)  Aspirin 81 Mg  Tabs (Aspirin) .Marland Kitchen.. 1 By Mouth Daily  Allergies (verified): 1)  ! Tylenol 2)  ! * Anti Inflammitory 3)  ! * Sulma Compound 4)  ! * Telectin 5)  ! Darvocet  Past History:  Past Medical History: Reviewed history from 03/12/2009 and no changes required.  1. Hypertension.   2. Diabetes mellitus type 2.   3. Poliomyelitis with right lower extremity paralysis, is wheelchair       bound.   4. Hypothyroidism.   5. Cardiomyopathy (EF 45%)  6. Nonobstructive coronary disease  Past Surgical History: Reviewed history from 03/12/2009 and no changes required.  1. Hysterectomy.   2. Appendectomy.   Review of Systems       As stated in the HPI and negative for all other systems.   Vital Signs:  Patient profile:   65 year old female Height:      60 inches Weight:      173 pounds BMI:  33.91 Pulse rate:   67 / minute Resp:     16 per minute BP sitting:   134 / 69  (left arm)  Vitals Entered By: Marrion Coy, CNA (January 31, 2010 2:04 PM)  Physical Exam  General:  Well developed, well nourished, in no acute distress. Head:  normocephalic and atraumatic Neck:  no jugular venous distention at 90 Lungs:  Clear bilaterally to auscultation and percussion. Heart:  S1 and S2 within normal limits, no S3, no S4, no clicks, no rubs, no murmurs Abdomen:  Positive bowel sounds normal in frequency and pitch, no rebound or guarding, unable to appreciate organomegaly  secondary to the patient being seated in the wheelchair Msk:  normal upper extremity strength, severe with bilateral lower extremity weakness Pulses:  pulses normal in all 4 extremities Extremities:  trace edema Neurologic:  Alert and oriented x 3. Skin:  Intact without lesions or rashes. Cervical Nodes:  no significant adenopathy Psych:  Normal affect.   EKG  Procedure date:  01/31/2010  Findings:      Sinus rhythm, rate 67, axis within normal limits, intervals within normal limits, no acute ST-T wave changes  Impression & Recommendations:  Problem # 1:  TAKOTSUBO SYNDROME (ICD-429.83) She had a repeat echo today with results pending. I suspect her ejection fraction looks good. At this point I will make no change to her medical regimen pending the results.  Problem # 2:  EDEMA (ICD-782.3) Assessment: Unchanged This is much improved with conservative measures. No change in therapy is indicated.  Problem # 3:  HYPERTENSION (ICD-401.9) Her blood pressure is controlled. She will continue the meds as listed.  Patient Instructions: 1)  Your physician recommends that you schedule a follow-up appointment in: 12 months 2)  Your physician recommends that you continue on your current medications as directed. Please refer to the Current Medication list given to you today.

## 2010-06-14 NOTE — H&P (Signed)
NAMEARZU, MCGAUGHEY NO.:  [id number]  MEDICAL RECORD NO.:  [id number]          PATIENT TYPE:  INP  LOCATION:  0102                         FACILITY:  [facility]  PHYSICIAN:  Thad Ranger, MD       DATE OF BIRTH:  1945-12-12  DATE OF ADMISSION:  06/04/2010 DATE OF DISCHARGE:                             HISTORY & PHYSICAL   PRIMARY CARE PHYSICIAN:  Soyla Murphy. Renne Crigler, MD  DENTIST:  Lyndal Pulley. Chales Salmon, MD, dental surgeon.  CARDIOLOGIST:  Rollene Rotunda, MD, [facility]  CHIEF COMPLAINT:  Altered mental status and weakness.  HISTORY OF PRESENT ILLNESS:  Ms. Palecek is a 66 year old female with history of CHF, hypertension, chronic back pain, diabetes, GERD, hyperlipidemia, polio, presented to the [facility] Emergency Room with a chief complaint of confusion, altered mental status, and weakness. History was provided by the patient and her spouse present in the room. Per the patient, she had dental extractions done on Tuesday, January 17th, 4 days ago.  Per the patient, she was taking the oxycodone, acetaminophen tablets 10/325 mg initially every 3 to 4 hours as needed for the pain.  However, her pain after the dental extraction continued to worsen.  Per the patient, her dentist recommended her to take 2 tablets every 4 hours.  The patient stated that her pain was 7/10, hence she took the oxycodone as recommended.  However, the patient's husband stated that she may have taken more oxycodones.  The patient stated that she felt somewhat confused and weak yesterday.  She was unable to push herself up to get to the wheelchair.  She had a fall and fell on her hips.  Since yesterday, she has been having pain in the stomach area as well in the left lower quadrant after the fall as well as the hip.  The patient states that she is wheelchair dependent due to history of polio and the back injury.  The patient denies any suicidal ideation and any suicide attempt.  She informs that she was  taking the pain medication due to her recent dental work and pain.  PAST MEDICAL HISTORY: 1. History of CHF.  However, no echocardiogram on record.  The     patient's cardiologist is Dr. Antoine Poche. 2. Hypertension. 3. History of chronic back pain. 4. Diabetes mellitus type 2. 5. GERD. 6. Hyperlipidemia. 7. History of polio.  SOCIAL HISTORY:  The patient denies any smoking, alcohol or any drug use.  She currently lives at home with her husband and is wheelchair bound.  She states that in her house she has a ramp for the wheelchair.  ALLERGIES:  TYLENOL, SOMA, and TOLECTIN.  PHYSICAL EXAMINATION:  VITAL SIGNS:  Blood pressure 98/54, pulse rate 68, respiratory rate 16, temperature 98.3. GENERAL:  The patient is currently alert, not in any acute distress.  At the time of arrival in the emergency room, the patient was found to be lethargic and sleepy and received 2 mg of Narcan. HEENT:  Anicteric sclerae.  Pale conjunctivae.  Pupils reactive to light and accommodation.  EOMI.  Slightly dry mucosal membranes NECK:  Supple. No lymphadenopathy.  No JVD. CVS: S1, S2 clear.  Regular rate and rhythm. CHEST:  Clear to auscultation bilaterally. ABDOMEN:  Soft.  Mild left lower quadrant tenderness.  No rebound tenderness or guarding.  Normal bowel sounds. EXTREMITIES:  No cyanosis, clubbing, or edema noted in upper or lower extremities.  Some petechiae and bruising on the left lower extremity because of the recent fall.  LABORATORY DIAGNOSTIC DATA:  Urinalysis negative for any UTI.  CBC showed white count of 11.0, hemoglobin 10.4, hematocrit 30.0, platelets 304.  Urine drug screen positive for benzodiazepine, acetaminophen less than 10.  CMP, sodium 132, potassium 3.8, BUN 19, creatinine 0.8, total bilirubin 1.3.  Alcohol level 5.  Aspirin less than 1.  RADIOLOGICAL DATA:  CT head without contrast, no evidence of acute intracranial abnormality.  IMPRESSION AND PLAN:  Ms. Parsell is a  65 year old female with history of chronic back pain, diabetes, history of hypertension, congestive heart failure, who had a recent dental work up and was placed on narcotics for the pain, presents with altered mental status, which has improved after the Narcan in the emergency room. 1. Altered mental status with accidental overdose of oxycodone.  The     patient denies any suicide attempt.  She has improved after the     Narcan 2 mg in the emergency room.  The patient will be placed on     observation status on the tele monitor.  I will taper her     hydrocodone, acetaminophen to p.r.n. as needed to assess the     requirement for the pain.  I personally do not think she needs any     psychiatry consult at this time as this appears to be accidental     overdose. 2. Dehydration with weakness and falls.  Possibly secondary to     narcotic effect as well as the patient has not been eating well due     to the recent dental work 4 days ago.  The patient will be placed     on gentle hydration and will also obtain PT, OT consult. 3. Hypotension, likely secondary to dehydration.  Placed on IV fluids,     hold metoprolol and all other antihypertensives for now. 4. Diabetes mellitus.  I will place the patient on sliding scale     insulin as she has not been eating well.  Hold metformin for now     until the blood sugars are well controlled. 5. Prophylaxis.  Bilateral sequential compression devices for deep     venous thrombosis prophylaxis.     Thad Ranger, MD     RR/MEDQ  D:  06/04/2010  T:  06/04/2010  Job:  [id number]  cc:   Soyla Murphy. Renne Crigler, M.D. Fax: [phone]  Electronically Signed by Kedra Mcglade  on 06/14/2010 04:52:48 PM

## 2010-06-14 NOTE — Progress Notes (Signed)
Summary: BP  Phone Note Outgoing Call   Call placed by: Sander Nephew, RN Call placed to: Patient Details for Reason: Blood Pressure Summary of Call: yesterday BP was 76/54 HR 63, 121/63  HR 63 today 126/100 HR 66 and 117/66  has been feeling really nervous.  Still having nervous feeling, no sob or chest pain. Instructed pt to keep a BP log over the next several days and call to report them.  If her BP should remain lower than 100/50 she should call before then.  Pt states understanding and has an appt upcoming Initial call taken by: Charolotte Capuchin, RN,  July 08, 2009 5:57 PM

## 2010-06-14 NOTE — Progress Notes (Signed)
Summary: questions about d/x  Phone Note Call from Patient Call back at Home Phone [phone]   Caller: Patient Summary of Call: Pt have question about her d/x Initial call taken by: Judie Grieve,  March 31, 2010 2:04 PM  Follow-up for Phone Call        supposed to have injection in her back tomorrow - has paperwork to be filled out.  Pt had questions about her medications and diagonsis.  Reviewed medications and dx with pt and answered all questions. She requests a copy of her medication list be mailed to her which will be done today.   She denied any other quetions and/or needs Follow-up by: Charolotte Capuchin, RN,  March 31, 2010 3:20 PM      Patient Instructions: 1)  Your physician recommends that you continue on your current medications as directed. Please refer to the Current Medication list given to you today.

## 2010-06-14 NOTE — Consult Note (Signed)
Summary: [name]   Imported By: Marylou Mccoy 05/25/2009 17:09:02  _____________________________________________________________________  External Attachment:    Type:   Image     Comment:   External Document

## 2010-06-19 NOTE — Discharge Summary (Signed)
NAMEKARLE, DESROSIER NO.:  [id number]  MEDICAL RECORD NO.:  [id number]          PATIENT TYPE:  INP  LOCATION:  1402                         FACILITY:  [facility]  PHYSICIAN:  Conley Canal, MD      DATE OF BIRTH:  07/31/1945  DATE OF ADMISSION:  06/04/2010 DATE OF DISCHARGE:                        DISCHARGE SUMMARY - REFERRING   PRIMARY CARE PHYSICIAN:  Soyla Murphy. Renne Crigler, MD  DENTIST:  Lyndal Pulley. Chales Salmon, MD  CARDIOLOGIST:  Rollene Rotunda, MD, [facility]  CONSULTING PHYSICIANS:  Lennie Muckle, MD, [facility] Rollene Rotunda, MD, [facility]  PROBLEM LIST: 1. Acute delirium, resolved most likely multifactorial including     narcotics ongoing infection status post dental extraction. 2. Acute acalculous cholecystitis. 3. Takotsubo cardiomyopathy, resolved. 4. Essential hypertension. 5. Nonobstructive coronary artery disease status post cardiac     catheterization in September 2010 showing 40% LAD stenosis. 6. Hypothyroidism.  PROCEDURES PERFORMED: 1. CT of the brain without contrast on June 04, 2010 no evidence     for acute intracranial abnormality. 2. CT abdomen and pelvis with a contrast showed findings consistent     with acute cholecystitis.  No evidence of biliary ductal     dilatation.  No other significant findings. 3. Pelvic x-rays on June 04, 2010 showed no acute findings. 4. Abdominal ultrasound on June 06, 2010 showed biliary sludge with     some gallbladder wall thickening.  No gallstones evident and no     evidence of biliary dilatation. 5. A 2-D echocardiogram on June 06, 2010 showed EF 55% to 60% with     normal wall motion, mild mitral regurgitation and peak pulmonary     arterial pressure of 39 mmHg.  HOSPITAL COURSE:  This pleasant 65 year old lady with comorbidities as noted above came to the hospital with generalized weakness and acute confusion, which was thought to be related to narcotics.  The patient was receiving status  post dental extraction.  The patient also complained of some abdominal pain and nausea, prompting CT abdomen and pelvis, which suggested an acute cholecystitis with a followup abdominal ultrasound showing gallbladder sludge and some abdominal wall thickening.  The patient is being followed by [facility], input appreciated.  The plan is for laparoscopic cholecystectomy on June 08, 2010.  Because of the patient's cardiac history, cardiology was consulted and Dr. Rollene Rotunda graciously saw the patient and felt that she had acceptable risk to proceed with surgery.  Otherwise, today the patient complains of right upper quadrant pain.  Some occasional shortness of breath.  PHYSICAL EXAMINATION:  GENERAL:  She is not in acute distress. VITAL SIGNS:  Blood pressure 125/74, heart rate of 63, temperature 97.9, respirations 20, oxygen saturation 95% on room air.  HEAD, EARS, EYES, NOSE, AND Throat:  No jugular venous distention.  No carotid bruits. She has missing teeth. RESPIRATORY:  Good air entry bilaterally with no rhonchi, rales, or wheezes. CARDIOVASCULAR:  S1 and S2 heart sounds heard.  No murmurs.  Pulse regular. ABDOMEN:  Soft, some tenderness to deep palpation right upper quadrant with deep inspiration.  Bowel sounds normal. CNS: Patient  seem a little anxious, otherwise alert, oriented in person, place, and time with no acute focal neurological deficits.  EXTREMITIES: No pedal edema.  LABORATORY DATA:  WBC 6.2, hemoglobin 9.5, hematocrit 28.7, platelet count 330, sodium 138, potassium 4.3, BUN 5, creatinine 0.44.  LFTs unremarkable.  Magnesium 1.5, phosphorus 2.9, TSH 3.874.  Cardiac enzymes significant for CPK elevated, improved with rehydration.  PLAN: 1. Acute acalculous cholecystitis.  Plan is for laparoscopic     cholecystectomy on June 08, 2010, n.p.o. past midnight, surgical     input appreciated. 2. Nonobstructive coronary artery disease.  Takotsubo  cardiomyopathy,     no evidence of decompensation.  Cardiology input appreciated.     Follow cardiology recommendations. 3. Depression, stable.  Continue Celexa. 4. Hypomagnesemia, hypokalemia secondary to poor oral intake,     replenish as necessary. 5. Hypothyroidism, stable.  Continue Synthroid. 6. Deep venous thrombosis prophylaxis, SCDs. 7. Gastrointestinal prophylaxis, proton pump inhibitors.     Conley Canal, MD     SR/MEDQ  D:  06/07/2010  T:  06/07/2010  Job:  [id number]  Electronically Signed by Conley Canal  on 06/19/2010 09:28:35 PM

## 2010-06-23 NOTE — Discharge Summary (Signed)
[name], [name] NO.:  [id number]  MEDICAL RECORD NO.:  [id number]          PATIENT TYPE:  INP  LOCATION:  1402                         FACILITY:  [facility]  PHYSICIAN:  Calvert Cantor, M.D.     DATE OF BIRTH:  10-20-1945  DATE OF ADMISSION:  06/04/2010 DATE OF DISCHARGE:  06/13/2010                              DISCHARGE SUMMARY   PRIMARY CARE PHYSICIAN:  Soyla Murphy. Renne Crigler, M.D.  DENTIST:  Lyndal Pulley. Chales Salmon, M.D.  CARDIOLOGIST:  Rollene Rotunda, MD, [facility]  Please see discharge summary that was dictated on June 07, 2010 by Dr. Venetia Constable, job [phone].  The patient was not discharged on January 25 as planned.  Apparently, when her oxygen was taken off, she was noted to be hypoxic.  This was found to be secondary to pleural effusions.  The patient is chronically on diuretics but apparently this was not noted on admission.  According to the med rec either.  Also, the patient was confused on admission and therefore none of her medications could be confirmed with her.  Due to this, diuretics were not reinstated. Eventually, she developed some fluid overload.  She was diuresed and is now back to her baseline and is no longer requiring O2.  DISCHARGE DIAGNOSES: 1. Acute delirium, exact cause uncertain; however, it is suspected     that this was secondary to the narcotics she received after her     dental extraction 2. Acute acalculous cholecystitis status post laparoscopic     cholecystectomy on June 08, 2010.  PAST MEDICAL HISTORY: 1. Takotsubo cardiomyopathy. 2. Hypertension. 3. Nonobstructive coronary artery disease. 4. Hypothyroidism.  DISCHARGE MEDICATIONS: 1. Oxycodone 5 mg 1-2 tablets q.4 h. as needed for pain, prescription     has been written by Letha Cape for Dr. Dwain Sarna.  She has     given her a prescription for 40 tablets. 2. Losartan 50 mg p.o. daily. 3. Citalopram 40 mg p.o. daily. 4. Fish oil 1 g 2 capsules daily. 5. Ambien 10 mg  q.h.s. 6. Clonazepam 0.5 mg b.i.d. p.r.n. 7. Metoprolol XL 25 mg p.o. daily. 8. Metformin 500 mg p.o. b.i.d. 9. Tizanidine 4 mg p.o. q.i.d. as needed. 10.Benadryl 25 mg p.o. q.6 h. as needed for itching. 11.Furosemide 20 mg p.o. daily. 12.Multivitamin 1 tablet p.o. daily. 13.Nitroglycerin 0.4 mg q. 5 minutes up to 3 doses as needed for chest     pain. 14.She may still have prescriptions for Percocet and hydrocodone at     home.  Anyhow for now, she will be taking oxycodone and should stop     all other narcotics. 15.Maxzide 75/50 half tablet daily. 16.Dexilant 60 mg p.o. daily. 17.Calcium citrate with vitamin D 600/400 three tablets at bedtime. 18.Synthroid 88 mcg daily. 19.Vitamin B12 OTC 1 tablet daily. 20.Vitamin D2 50,000 units q. monthly.  HOSPITAL COURSE:  For full detailed hospital course, please see the discharge summary dictated by Dr. Venetia Constable.  PHYSICAL EXAMINATION:  RESPIRATORY:  Today, lungs clear with normal respiratory effort.  No use of accessory muscles. HEART:  Regular rate and rhythm.  No murmurs. ABDOMEN:  Soft,  nontender, nondistended.  Bowel sounds positive. Incisions from lap chole healing well. EXTREMITIES:  No cyanosis, clubbing or edema.  The patient has been evaluated by physical therapy.  Apparently, she spends most of her time in a wheelchair and only stands when she is moving from wheelchair to her bed.  Therefore, she does not need any physical therapy.  FOLLOWUP INSTRUCTIONS: 1. She is to follow up with Surgery, an appointment has been given for     her on February 7 at 2:45 p.m. 2. She needs to follow up with her primary care physician, Dr. Merri Brunette, in 1-2 weeks.  TIME ON DISCHARGE:  60 minutes.     Calvert Cantor, M.D.     SR/MEDQ  D:  06/13/2010  T:  06/13/2010  Job:  [id number]  cc:   Soyla Murphy. Renne Crigler, M.D. Fax: [phone]  Electronically Signed by Calvert Cantor M.D. on 06/23/2010 12:31:59 PM

## 2010-06-30 NOTE — Consult Note (Addendum)
[name], [name] NO.:  [id number]  MEDICAL RECORD NO.:  [id number]          PATIENT TYPE:  INP  LOCATION:  1402                         FACILITY:  [facility]  PHYSICIAN:  Lennie Muckle, MD      DATE OF BIRTH:  January 17, 1946  DATE OF CONSULTATION:  06/05/2010 DATE OF DISCHARGE:                                CONSULTATION   REQUESTING PHYSICIANS: 1. [facility]. 2. Everett Graff, NP  REASON FOR CONSULTATION:  Question of acute cholecystitis.  CHIEF COMPLAINT:  Status post fall.  [name] is a 65 year old female who was apparently admitted for altered mental status yesterday.  She states she has had some dental work approximately a week ago, was taking oxycodone, had taken several high doses of oxycodone, became confused and fell from her wheelchair. She is confined to her wheelchair due to her polio.  When she fell, she stuck her abdomen.  She came in the emergency department due to her confusion and abdominal pain.  Due to her abdominal pain, she has had a CT scan for evaluation.  On the CT scan, there was question of acute cholecystitis due to inflammatory changes and wall thickening on the gallbladder.  She reports not having any history of right upper quadrant abdominal pain.  She has not had any nausea or vomiting.  She had a bowel movement yesterday.  She has not had any episodes of jaundice, pain after eating.  She does have chronic back pain as well as reflux disease.  She is on Dexilant, which pretty much controls her reflux disease.  She states several years ago, she had a workup by Dr. Randa Evens including an upper endoscopy.  I do not have these reports and she did not recall having any incidental findings other than the reflux disease. She apparently had a slight elevation of her bilirubin upon admission with a bilirubin of 1.3.  Liver enzymes were normal, AST of 34, ALT of 22, alkaline phosphatase of 50.  White count was mildly elevated at  11, hemoglobin and hematocrit 10 and 30.  Her CBC this morning is normal at 5.7.  She states she had some mild abdominal pain all over her abdomen, this is worse so in the epigastric region.  Her pain is worse when she moves or tries to shift up in the sitting position.  She has to use her upper extremity to help move her body due to her polio.  PAST MEDICAL HISTORY:  Significant for: 1. Congestive heart failure. 2. Hypertension. 3. Chronic back pain. 4. Polio. 5. Reflux disease. 6. Hyperlipidemia. 7. Diabetes.  PAST SURGICAL HISTORY:  She had a surgery on her left knee.  No abdominal surgery.  CURRENT MEDICATIONS AT HOME:  Include tizanidine, simvastatin, metoprolol, zolpidem, pen VK, oxycodone, Benadryl, Lasix, Synthroid, metformin, losartan, triamterene, Nitrostat, vitamin B12.  ALLERGIES:  Include INVANZ and TOLECTIN.  SOCIAL HISTORY:  No tobacco use.  No alcohol.  She lives with herself. She is confined to her wheelchair.  REVIEW OF SYSTEMS:  Negative other than the HPI.  PHYSICAL EXAMINATION:  GENERAL:  She is pleasant, obese female  lying in bed in no acute distress. VITAL SIGNS:  Temperature 97.9, pulse 66, blood pressure 120/74. HEENT:  Head is normocephalic.  Extraocular muscles are intact.  Pupils are equal and round.  Sclerae, conjunctivae are clear.  Nares are clear without drainage.  Oral mucosa is somewhat pale.  Dentition is poor.  No drainage is noted.  Oral mucosa is moist. NECK:  Normal range of motion.  Trachea is midline.  Thyroid without apparent abnormalities.  No pain with palpation. CHEST:  Clear to auscultation bilaterally.  Normal expansion, excursion. CARDIOVASCULAR:  Regular rate and rhythm.  She does have edema in the lower extremity.  Pulses are palpated on the upper extremity. ABDOMEN:  Obese, soft, mildly tender over all quadrants, but no significant tenderness in the right upper quadrant.  No masses.  No organomegaly.  Bowel sounds are  auscultated. SKIN:  Warm to touch.  There is no jaundice.  No rashes. MUSCULOSKELETAL:  No deformities.  She does have some edema in the lower extremities, incisional scars on the left knee.  The left leg appears slightly larger than the right leg. NEUROLOGIC:  She is unable to move the bilateral lower extremities, although she does have sensation to touch bilaterally. PSYCHOLOGIC:  Normal.  IMAGING:  CT scan was reviewed.  There were mild inflammatory changes around the gallbladder, somewhat distended.  No pericholecystic fluid. Bilirubin upon admission was 1.3, other labs are normal.  ASSESSMENT/PLAN:  Likely incidental finding on the CT scan after obtaining for traumatic reasons.  Suspect that she might have chronic cholecystitis, but doubtful she has acute cholecystitis given her clinical history and examination.  She does have a history of reflux disease.  Given the findings on the CT, I would recommend a followup ultrasound just to make sure that this indeed was an incidental finding. I suspect that this study would likely be negative or only reveal chronic cholecystitis, which is not an acute surgical need.  I would doubt that she would need further studies nor she would need surgery on her gallbladder.  I have discussed this with the patient that her studies are presently reviewed.  I will have Dr. Jomarie Longs for the ultrasound.  I will order a soft mechanical crab-modified diet after the ultrasound since I suspect that this will have to be negative.  If the ultrasound is indeed negative, there is not really a need to follow up with the surgery.  Thank you very much for allowing me to participate in the care of [name].     Lennie Muckle, MD     ALA/MEDQ  D:  06/05/2010  T:  06/06/2010  Job:  [id number]  Electronically Signed by Bertram Savin MD on 06/30/2010 02:16:39 PM

## 2010-07-28 LAB — CBC
MCHC: 34 g/dL (ref 30.0–36.0)
MCV: 88.4 fL (ref 78.0–100.0)
Platelets: 357 10*3/uL (ref 150–400)
RDW: 13.5 % (ref 11.5–15.5)
WBC: 8 10*3/uL (ref 4.0–10.5)

## 2010-07-28 LAB — POCT CARDIAC MARKERS
CKMB, poc: <1 ng/mL — ABNORMAL LOW (ref 1.0–8.0)
CKMB, poc: <1 ng/mL — ABNORMAL LOW (ref 1.0–8.0)
Myoglobin, poc: 32.5 ng/mL (ref 12–200)
Troponin i, poc: <0.05 ng/mL (ref 0.00–0.09)
Troponin i, poc: <0.05 ng/mL (ref 0.00–0.09)

## 2010-07-28 LAB — BASIC METABOLIC PANEL
BUN: 35 mg/dL — ABNORMAL HIGH (ref 6–23)
Creatinine, Ser: 1 mg/dL (ref 0.4–1.2)
GFR calc non Af Amer: 55 mL/min — ABNORMAL LOW (ref >60)
Glucose, Bld: 86 mg/dL (ref 70–99)

## 2010-07-28 LAB — DIFFERENTIAL
Basophils Absolute: 0.1 10*3/uL (ref 0.0–0.1)
Basophils Relative: 1 % (ref 0–1)
Eosinophils Relative: 8 % — ABNORMAL HIGH (ref 0–5)
Lymphocytes Relative: 36 % (ref 12–46)
Neutro Abs: 3.6 10*3/uL (ref 1.7–7.7)

## 2010-07-28 LAB — BRAIN NATRIURETIC PEPTIDE: Pro B Natriuretic peptide (BNP): <30 pg/mL (ref 0.0–100.0)

## 2010-07-28 LAB — PROTIME-INR: Prothrombin Time: 12.4 seconds (ref 11.6–15.2)

## 2010-08-19 LAB — BASIC METABOLIC PANEL
BUN: 14 mg/dL (ref 6–23)
CO2: 26 mEq/L (ref 19–32)
Calcium: 8.9 mg/dL (ref 8.4–10.5)
Chloride: 100 mEq/L (ref 96–112)
Creatinine, Ser: 0.65 mg/dL (ref 0.4–1.2)
GFR calc Af Amer: >60 mL/min (ref >60)
GFR calc non Af Amer: >60 mL/min (ref >60)
Glucose, Bld: 121 mg/dL — ABNORMAL HIGH (ref 70–99)
Glucose, Bld: 92 mg/dL (ref 70–99)
Potassium: 4.2 mEq/L (ref 3.5–5.1)
Sodium: 134 mEq/L — ABNORMAL LOW (ref 135–145)

## 2010-08-19 LAB — CBC
HCT: 33.2 % — ABNORMAL LOW (ref 36.0–46.0)
HCT: 34.5 % — ABNORMAL LOW (ref 36.0–46.0)
Hemoglobin: 11.3 g/dL — ABNORMAL LOW (ref 12.0–15.0)
Hemoglobin: 11.8 g/dL — ABNORMAL LOW (ref 12.0–15.0)
MCHC: 34.2 g/dL (ref 30.0–36.0)
MCHC: 34.3 g/dL (ref 30.0–36.0)
MCV: 93.2 fL (ref 78.0–100.0)
MCV: 93.5 fL (ref 78.0–100.0)
Platelets: 313 10*3/uL (ref 150–400)
Platelets: 352 10*3/uL (ref 150–400)
Platelets: 359 10*3/uL (ref 150–400)
RBC: 3.56 MIL/uL — ABNORMAL LOW (ref 3.87–5.11)
RBC: 3.69 MIL/uL — ABNORMAL LOW (ref 3.87–5.11)
RDW: 12.7 % (ref 11.5–15.5)
RDW: 12.9 % (ref 11.5–15.5)
RDW: 13.4 % (ref 11.5–15.5)
WBC: 10.6 10*3/uL — ABNORMAL HIGH (ref 4.0–10.5)
WBC: 11.5 10*3/uL — ABNORMAL HIGH (ref 4.0–10.5)
WBC: 7.6 10*3/uL (ref 4.0–10.5)

## 2010-08-19 LAB — HEPATIC FUNCTION PANEL
ALT: 41 U/L — ABNORMAL HIGH (ref 0–35)
AST: 42 U/L — ABNORMAL HIGH (ref 0–37)
Albumin: 3.8 g/dL (ref 3.5–5.2)
Alkaline Phosphatase: 58 U/L (ref 39–117)
Bilirubin, Direct: 0.1 mg/dL (ref 0.0–0.3)
Indirect Bilirubin: 0.8 mg/dL (ref 0.3–0.9)
Total Bilirubin: 0.9 mg/dL (ref 0.3–1.2)
Total Protein: 6.8 g/dL (ref 6.0–8.3)

## 2010-08-19 LAB — CARDIAC PANEL(CRET KIN+CKTOT+MB+TROPI)
CK, MB: 2.4 ng/mL (ref 0.3–4.0)
Relative Index: INVALID (ref 0.0–2.5)
Total CK: 33 U/L (ref 7–177)
Troponin I: 0.2 ng/mL — ABNORMAL HIGH (ref 0.00–0.06)

## 2010-08-19 LAB — GLUCOSE, CAPILLARY
Glucose-Capillary: 112 mg/dL — ABNORMAL HIGH (ref 70–99)
Glucose-Capillary: 124 mg/dL — ABNORMAL HIGH (ref 70–99)
Glucose-Capillary: 126 mg/dL — ABNORMAL HIGH (ref 70–99)
Glucose-Capillary: 126 mg/dL — ABNORMAL HIGH (ref 70–99)
Glucose-Capillary: 91 mg/dL (ref 70–99)
Glucose-Capillary: 96 mg/dL (ref 70–99)
Glucose-Capillary: 97 mg/dL (ref 70–99)

## 2010-08-19 LAB — POCT CARDIAC MARKERS
CKMB, poc: <1 ng/mL — ABNORMAL LOW (ref 1.0–8.0)
Troponin i, poc: <0.05 ng/mL (ref 0.00–0.09)

## 2010-08-19 LAB — URINALYSIS, ROUTINE W REFLEX MICROSCOPIC
Glucose, UA: NEGATIVE mg/dL
Ketones, ur: NEGATIVE mg/dL
Nitrite: NEGATIVE
pH: 6 (ref 5.0–8.0)

## 2010-08-19 LAB — COMPREHENSIVE METABOLIC PANEL
ALT: 35 U/L (ref 0–35)
AST: 31 U/L (ref 0–37)
Albumin: 3.3 g/dL — ABNORMAL LOW (ref 3.5–5.2)
Alkaline Phosphatase: 49 U/L (ref 39–117)
BUN: 14 mg/dL (ref 6–23)
CO2: 26 mEq/L (ref 19–32)
Calcium: 8.6 mg/dL (ref 8.4–10.5)
Chloride: 98 mEq/L (ref 96–112)
Creatinine, Ser: 0.62 mg/dL (ref 0.4–1.2)
GFR calc Af Amer: >60 mL/min (ref >60)
GFR calc non Af Amer: >60 mL/min (ref >60)
Glucose, Bld: 115 mg/dL — ABNORMAL HIGH (ref 70–99)
Potassium: 4.1 mEq/L (ref 3.5–5.1)
Sodium: 131 mEq/L — ABNORMAL LOW (ref 135–145)
Total Bilirubin: 0.8 mg/dL (ref 0.3–1.2)
Total Protein: 5.9 g/dL — ABNORMAL LOW (ref 6.0–8.3)

## 2010-08-19 LAB — CK TOTAL AND CKMB (NOT AT ARMC): CK, MB: 1.8 ng/mL (ref 0.3–4.0)

## 2010-08-19 LAB — DIFFERENTIAL
Basophils Absolute: 0 10*3/uL (ref 0.0–0.1)
Eosinophils Relative: 5 % (ref 0–5)
Lymphocytes Relative: 25 % (ref 12–46)
Lymphs Abs: 2.6 10*3/uL (ref 0.7–4.0)
Neutro Abs: 6.6 10*3/uL (ref 1.7–7.7)
Neutrophils Relative %: 63 % (ref 43–77)

## 2010-08-19 LAB — TROPONIN I
Troponin I: 0.2 ng/mL — ABNORMAL HIGH (ref 0.00–0.06)
Troponin I: 0.49 ng/mL — ABNORMAL HIGH (ref 0.00–0.06)

## 2010-08-19 LAB — TSH: TSH: 1.262 u[IU]/mL (ref 0.350–4.500)

## 2010-08-19 LAB — HEMOGLOBIN A1C: Hgb A1c MFr Bld: 5.2 % (ref 4.6–6.1)

## 2010-08-19 LAB — HEPARIN LEVEL (UNFRACTIONATED): Heparin Unfractionated: 0.26 IU/mL — ABNORMAL LOW (ref 0.30–0.70)

## 2010-08-19 LAB — LIPASE, BLOOD: Lipase: 19 U/L (ref 11–59)

## 2010-08-19 LAB — D-DIMER, QUANTITATIVE: D-Dimer, Quant: 0.22 ug/mL-FEU (ref 0.00–0.48)

## 2010-09-30 NOTE — Op Note (Signed)
   NAME:  [name], [name]                           ACCOUNT NO.:  [id number]   MEDICAL RECORD NO.:  [id number]                   PATIENT TYPE:  AMB   LOCATION:  ENDO                                 FACILITY:  [facility]   PHYSICIAN:  Georgiana Spinner, M.D.                 DATE OF BIRTH:  05/10/1946   DATE OF PROCEDURE:  02/06/2003  DATE OF DISCHARGE:                                 OPERATIVE REPORT   PROCEDURE:  Colonoscopy.   INDICATIONS:  Rectal bleeding.   ANESTHESIA:  1. Demerol 30 mg.  2. Versed 4 mg.   DESCRIPTION OF PROCEDURE:  With patient mildly sedated in the left lateral  decubitus position, the Olympus videoscopic colonoscope was inserted in the  rectum, passed under direct vision to the cecum, identified by crow's foot  of the cecum and the ileocecal valve, both of which were photographed.  The  prep was suboptimal in that there were many areas where there was solid  stool that could not be suctioned.  It was mixed with vegetable material,  but we cleaned and suctioned as best we could.  After photographing this  area, we withdrew the colonoscope taking circumferential views of colonic  mucosa, stopping subsequently only to suction and clean debris until we  reached the rectum which appeared normal on direct and showed hemorrhoids on  retroflexed view.  The endoscope was straightened and withdrawn.  The  patient's vital signs and pulse oximeter remained stable.  The patient  tolerated the procedure well without apparent complications.   FINDINGS:  Suboptimal preparation limits this examination somewhat, but  internal hemorrhoids were noted.  Otherwise an unremarkable examination, as  noted, somewhat limited.   PLAN:  See endoscopy note for further details.                                               Georgiana Spinner, M.D.    GMO/MEDQ  D:  02/06/2003  T:  02/06/2003  Job:  [id number]

## 2010-09-30 NOTE — Op Note (Signed)
   NAME:  [name], [name]                           ACCOUNT NO.:  [id number]   MEDICAL RECORD NO.:  [id number]                   PATIENT TYPE:  AMB   LOCATION:  ENDO                                 FACILITY:  [facility]   PHYSICIAN:  Georgiana Spinner, M.D.                 DATE OF BIRTH:  11-17-1945   DATE OF PROCEDURE:  DATE OF DISCHARGE:                                 OPERATIVE REPORT   PROCEDURE:  Upper endoscopy with dilation.   INDICATIONS:  Solid food dysphagia.   ANESTHESIA:  Demerol 70 mg, Versed 8 mg.   DESCRIPTION OF PROCEDURE:  With the patient mildly sedated in the left  lateral decubitus position, the Olympus videoscopic endoscope was inserted  in the mouth and passed under direct vision through the esophagus, which  appeared normal, into the stomach.  The  fundus, body, antrum, duodenal bulb  and second portion of the duodenum appeared normal.  From this point, the  endoscope was slowly withdrawn taking circumferential views of the duodenal  mucosa until the endoscope then pulled back into the stomach, placed in  retroflexion and viewed the stomach from below.  The endoscope was then  straightened, withdrawn, taking circumferential views of the remaining  gastric and esophageal mucosa.  The endoscope was then reinserted distally  until we reached antrum.  The guide wire was passed.  The endoscope was  removed.  Subsequently Savary dilators, 15 and 18, were passed rather easily  with minimal resistance.  There was no blood seen on either dilator and with  the latter, the guide wire was removed.  The endoscope reinserted.  No blood  was seen in the lumen.  The endoscope was withdrawn.  The patient's vital  signs and pulse oximetry remained stable.  The patient tolerated the  procedure well without apparent complication.   FINDINGS:  Unremarkable examination with the patient dilated to 15 and 18  Savary dilation.   PLAN:  Await clinical response.  The patient will follow up with  me as an  outpatient.                                                Georgiana Spinner, M.D.    GMO/MEDQ  D:  02/06/2003  T:  02/06/2003  Job:  [id number]

## 2010-10-03 ENCOUNTER — Other Ambulatory Visit: Payer: Self-pay | Admitting: Cardiology

## 2010-11-14 ENCOUNTER — Other Ambulatory Visit: Payer: Self-pay | Admitting: *Deleted

## 2010-11-14 MED ORDER — FUROSEMIDE 20 MG PO TABS: 20.0000 mg | ORAL_TABLET | ORAL | Status: DC

## 2011-01-10 ENCOUNTER — Other Ambulatory Visit: Payer: Self-pay | Admitting: *Deleted

## 2011-01-10 MED ORDER — METOPROLOL SUCCINATE ER 25 MG PO TB24: 12.5000 mg | ORAL_TABLET | Freq: Every day | ORAL | Status: DC

## 2011-01-31 ENCOUNTER — Other Ambulatory Visit: Payer: Self-pay | Admitting: Internal Medicine

## 2011-01-31 DIAGNOSIS — N644 Mastodynia: Secondary | ICD-10-CM

## 2011-02-08 ENCOUNTER — Ambulatory Visit
Admission: RE | Admit: 2011-02-08 | Discharge: 2011-02-08 | Disposition: A | Discharge status: Home / Self Care | Payer: Medicare Other | Source: Ambulatory Visit | Attending: Internal Medicine | Admitting: Internal Medicine

## 2011-02-08 ENCOUNTER — Other Ambulatory Visit: Payer: Self-pay | Admitting: Internal Medicine

## 2011-02-08 DIAGNOSIS — N644 Mastodynia: Secondary | ICD-10-CM

## 2011-03-21 ENCOUNTER — Ambulatory Visit
Admission: RE | Admit: 2011-03-21 | Discharge: 2011-03-21 | Disposition: A | Discharge status: Home / Self Care | Payer: Medicare Other | Source: Ambulatory Visit | Attending: Internal Medicine | Admitting: Internal Medicine

## 2011-03-21 DIAGNOSIS — N644 Mastodynia: Secondary | ICD-10-CM

## 2011-05-08 ENCOUNTER — Encounter: Payer: Self-pay | Admitting: Family Medicine

## 2011-05-08 ENCOUNTER — Emergency Department (HOSPITAL_COMMUNITY)
Admission: EM | Admit: 2011-05-08 | Discharge: 2011-05-08 | Disposition: A | Discharge status: Home / Self Care | Payer: Medicare Other | Attending: Emergency Medicine | Admitting: Emergency Medicine

## 2011-05-08 ENCOUNTER — Emergency Department (HOSPITAL_COMMUNITY): Payer: Medicare Other

## 2011-05-08 DIAGNOSIS — S91309A Unspecified open wound, unspecified foot, initial encounter: Secondary | ICD-10-CM | POA: Insufficient documentation

## 2011-05-08 DIAGNOSIS — W208XXA Other cause of strike by thrown, projected or falling object, initial encounter: Secondary | ICD-10-CM | POA: Insufficient documentation

## 2011-05-08 DIAGNOSIS — M79609 Pain in unspecified limb: Secondary | ICD-10-CM | POA: Insufficient documentation

## 2011-05-08 DIAGNOSIS — I10 Essential (primary) hypertension: Secondary | ICD-10-CM | POA: Insufficient documentation

## 2011-05-08 DIAGNOSIS — E119 Type 2 diabetes mellitus without complications: Secondary | ICD-10-CM | POA: Insufficient documentation

## 2011-05-08 DIAGNOSIS — B91 Sequelae of poliomyelitis: Secondary | ICD-10-CM | POA: Insufficient documentation

## 2011-05-08 DIAGNOSIS — S91312A Laceration without foreign body, left foot, initial encounter: Secondary | ICD-10-CM

## 2011-05-08 HISTORY — DX: Postpolio syndrome: G14

## 2011-05-08 HISTORY — DX: Essential (primary) hypertension: I10

## 2011-05-08 HISTORY — DX: Dorsalgia, unspecified: M54.9

## 2011-05-08 HISTORY — DX: Other chronic pain: G89.29

## 2011-05-08 MED ORDER — LIDOCAINE HCL 1 % IJ SOLN
INTRAMUSCULAR | Status: AC
  Administered 2011-05-08: 19:00:00
  Filled 2011-05-08: qty 20

## 2011-05-08 MED ORDER — CEPHALEXIN 500 MG PO CAPS: 500.0000 mg | ORAL_CAPSULE | Freq: Four times a day (QID) | ORAL | Status: AC

## 2011-05-08 MED ORDER — LIDOCAINE HCL (PF) 1 % IJ SOLN: 5.0000 mL | Freq: Once | INTRAMUSCULAR | Status: DC

## 2011-05-08 MED ORDER — TRAMADOL HCL 50 MG PO TABS: 50.0000 mg | ORAL_TABLET | Freq: Four times a day (QID) | ORAL | Status: DC | PRN

## 2011-05-08 MED ORDER — TRAMADOL HCL 50 MG PO TABS: 50.0000 mg | ORAL_TABLET | Freq: Four times a day (QID) | ORAL | Status: AC | PRN

## 2011-05-08 MED ORDER — TRAMADOL HCL 50 MG PO TABS
50.0000 mg | ORAL_TABLET | Freq: Once | ORAL | Status: AC
  Administered 2011-05-08: 50 mg via ORAL
  Filled 2011-05-08: qty 1

## 2011-05-08 NOTE — ED Provider Notes (Signed)
Medical screening examination/treatment/procedure(s) were performed by non-physician practitioner and as supervising physician I was immediately available for consultation/collaboration.   Dayton Bailiff, MD 05/08/11 2109

## 2011-05-08 NOTE — ED Notes (Signed)
Pt reports that last night around 20:00 a metal fish wall hanging fell on and cut her left foot. States she had a difficult time getting bleeding controlled. Reports pain 8/10, bleeding controlled at this time. CMS intact, pedal pulses present.

## 2011-05-08 NOTE — ED Provider Notes (Signed)
History     CSN: [id number]  Arrival date & time 05/08/11  1632   First MD Initiated Contact with Patient 05/08/11 1712      Chief Complaint  Patient presents with  . Foot Injury    LT- metal fish decoration fell off wall onto foot last night.  states there is a lac to top of foot-called PMD who sent her here.  Hx: DM.  pt wrapped it at home. states "bled all night" and is swollen    (Consider location/radiation/quality/duration/timing/severity/associated sxs/prior treatment) Patient is a 65 y.o. female presenting with foot injury. The history is provided by the patient.  Foot Injury  The incident occurred yesterday. The incident occurred at home. The injury mechanism was an incision. The pain is present in the left foot. The quality of the pain is described as aching. The pain has been constant since onset. Associated symptoms include inability to bear weight. She reports no foreign bodies present. The symptoms are aggravated by activity and bearing weight.  Pt states a metal decorative fish fell off the wall yesterday and stabbed her into foot. Reports laceration and pain to the left foot. Pt has chronic weakness in LE, wheelchair bound because of polio. States, usually able to pivit when transferring, but unable to do so now due to pain. No other complaints.   Past Medical History  Diagnosis Date  . Diabetes mellitus   . Post-polio syndrome   . Chronic back pain   . Broken heart syndrome   . Hypertension     History reviewed. No pertinent past surgical history.  History reviewed. No pertinent family history.  History  Substance Use Topics  . Smoking status: Never Smoker   . Smokeless tobacco: Not on file  . Alcohol Use: No    OB History    Grav Para Term Preterm Abortions TAB SAB Ect Mult Living                  Review of Systems  Musculoskeletal:       Foot pain  Skin:       laceration  All other systems reviewed and are negative.    Allergies    Acetaminophen; Nsaids; Propoxyphene n-acetaminophen; and Sulfa antibiotics  Home Medications   Current Outpatient Rx  Name Route Sig Dispense Refill  . FUROSEMIDE 20 MG PO TABS Oral Take 1 tablet (20 mg total) by mouth every morning. 30 tablet 6  . METOPROLOL SUCCINATE ER 25 MG PO TB24 Oral Take 0.5 tablets (12.5 mg total) by mouth daily. 45 tablet 3  . SIMVASTATIN 40 MG PO TABS  TAKE 1 TABLET DAILY. 30 tablet 6    BP 138/66  Pulse 66  Temp(Src) 98.2 F (36.8 C) (Oral)  Resp 16  SpO2 97%  Physical Exam  Nursing note and vitals reviewed. Constitutional: She is oriented to person, place, and time. She appears well-developed and well-nourished. No distress.  HENT:  Head: Normocephalic.  Neck: Neck supple.  Cardiovascular: Normal rate and regular rhythm.   Pulmonary/Chest: Effort normal and breath sounds normal. No respiratory distress.  Musculoskeletal:       Bilateral LE weakness, chronic. 3cm laceration to the left dorsal foot over the 4th metatarsal. Tender to palpation around the laceration. Full ROM of all toes. Wound hemostatic.  Neurological: She is alert and oriented to person, place, and time.  Skin: Skin is warm and dry. No erythema.       See MS exam  Psychiatric: She has  a normal mood and affect.    ED Course  Procedures (including critical care time)    05/08/2011  *RADIOLOGY REPORT*  Clinical Data: Stabbing injury.  LEFT FOOT - COMPLETE 3+ VIEW  Comparison: None.  Findings: There is no evidence of fracture in the left foot.  No radiodense foreign body.  No clear soft tissue injury.  IMPRESSION: No acute osseous abnormality.  Original Report Authenticated By: Genevive Bi, M.D.     X-ray negative. Wound repaired. Tetanus up to date. Will d/c home.   LACERATION REPAIR Performed by: Lottie Mussel Authorized by: Jaynie Crumble A Consent: Verbal consent obtained. Risks and benefits: risks, benefits and alternatives were discussed Consent given  by: patient Patient identity confirmed: provided demographic data Prepped and Draped in normal sterile fashion Wound explored  Laceration Location: left dorsal   Laceration Length: 3 cm  No Foreign Bodies seen or palpated  Anesthesia: local infiltration  Local anesthetic: lidocaine 1% w/o epinephrine  Anesthetic total: 2 ml  Irrigation method: syringe Amount of cleaning: standard  Skin closure: prolene 5.0   Number of sutures: 3  Technique: simple interrupted  Patient tolerance: Patient tolerated the procedure well with no immediate complications.   MDM          Lottie Mussel, PA 05/08/11 1918

## 2011-05-08 NOTE — ED Notes (Signed)
Pt is w/c bound b/c of polio and WU:JWJX injury.  Can usually stand to pivot to transfer but unable today b/c of the foot injury.

## 2011-06-13 ENCOUNTER — Emergency Department (HOSPITAL_COMMUNITY)
Admission: EM | Admit: 2011-06-13 | Discharge: 2011-06-14 | Disposition: A | Discharge status: Home / Self Care | Payer: Medicare Other | Attending: Emergency Medicine | Admitting: Emergency Medicine

## 2011-06-13 ENCOUNTER — Other Ambulatory Visit: Payer: Self-pay

## 2011-06-13 ENCOUNTER — Other Ambulatory Visit: Payer: Self-pay | Admitting: Cardiology

## 2011-06-13 ENCOUNTER — Encounter (HOSPITAL_COMMUNITY): Payer: Self-pay | Admitting: Emergency Medicine

## 2011-06-13 ENCOUNTER — Emergency Department (HOSPITAL_COMMUNITY): Payer: Medicare Other

## 2011-06-13 DIAGNOSIS — M216X9 Other acquired deformities of unspecified foot: Secondary | ICD-10-CM | POA: Insufficient documentation

## 2011-06-13 DIAGNOSIS — R0602 Shortness of breath: Secondary | ICD-10-CM | POA: Insufficient documentation

## 2011-06-13 DIAGNOSIS — M6250 Muscle wasting and atrophy, not elsewhere classified, unspecified site: Secondary | ICD-10-CM | POA: Insufficient documentation

## 2011-06-13 DIAGNOSIS — R079 Chest pain, unspecified: Secondary | ICD-10-CM | POA: Insufficient documentation

## 2011-06-13 DIAGNOSIS — Z79899 Other long term (current) drug therapy: Secondary | ICD-10-CM | POA: Insufficient documentation

## 2011-06-13 DIAGNOSIS — E119 Type 2 diabetes mellitus without complications: Secondary | ICD-10-CM | POA: Insufficient documentation

## 2011-06-13 DIAGNOSIS — Z8612 Personal history of poliomyelitis: Secondary | ICD-10-CM | POA: Insufficient documentation

## 2011-06-13 DIAGNOSIS — I10 Essential (primary) hypertension: Secondary | ICD-10-CM | POA: Insufficient documentation

## 2011-06-13 DIAGNOSIS — R0789 Other chest pain: Secondary | ICD-10-CM | POA: Insufficient documentation

## 2011-06-13 MED ORDER — SODIUM CHLORIDE 0.9 % IV SOLN
Freq: Once | INTRAVENOUS | Status: AC
  Administered 2011-06-13: 23:00:00 via INTRAVENOUS

## 2011-06-13 MED ORDER — ONDANSETRON HCL 4 MG/2ML IJ SOLN
4.0000 mg | Freq: Once | INTRAMUSCULAR | Status: AC
  Administered 2011-06-13: 4 mg via INTRAVENOUS
  Filled 2011-06-13: qty 2

## 2011-06-13 MED ORDER — HYDROMORPHONE HCL PF 1 MG/ML IJ SOLN
1.0000 mg | Freq: Once | INTRAMUSCULAR | Status: AC
  Administered 2011-06-13: 1 mg via INTRAVENOUS
  Filled 2011-06-13: qty 1

## 2011-06-13 NOTE — ED Notes (Signed)
Pt states she is having pain in her right chest that is intermittent  Pt states the pain is sharp in nature and radiates down her right arm  Pt states the pain radiates into her back  Pt states the pain comes about every 15 minutes and it started about 3 to 4 days ago

## 2011-06-13 NOTE — ED Notes (Signed)
Pt. Having right sided chest pain she rates as a 9.  This pain started about one week ago and has gradually worsened.  Pt. Reports nausea, SOB which comes and goes.  Alert and oriented, respirations even, unlabored.  Denies vomiting or diaphoresis.

## 2011-06-14 LAB — CBC
HCT: 39.4 % (ref 36.0–46.0)
Hemoglobin: 13.8 g/dL (ref 12.0–15.0)
MCH: 30.5 pg (ref 26.0–34.0)
MCHC: 35 g/dL (ref 30.0–36.0)
MCV: 87.2 fL (ref 78.0–100.0)

## 2011-06-14 LAB — COMPREHENSIVE METABOLIC PANEL
Albumin: 3.8 g/dL (ref 3.5–5.2)
BUN: 12 mg/dL (ref 6–23)
Chloride: 91 mEq/L — ABNORMAL LOW (ref 96–112)
Creatinine, Ser: 0.38 mg/dL — ABNORMAL LOW (ref 0.50–1.10)
Total Bilirubin: 0.4 mg/dL (ref 0.3–1.2)

## 2011-06-14 LAB — DIFFERENTIAL
Basophils Relative: 1 % (ref 0–1)
Eosinophils Absolute: 0.8 10*3/uL — ABNORMAL HIGH (ref 0.0–0.7)
Eosinophils Relative: 8 % — ABNORMAL HIGH (ref 0–5)
Monocytes Absolute: 1 10*3/uL (ref 0.1–1.0)
Monocytes Relative: 10 % (ref 3–12)

## 2011-06-14 LAB — CARDIAC PANEL(CRET KIN+CKTOT+MB+TROPI)
CK, MB: 1.4 ng/mL (ref 0.3–4.0)
Total CK: 19 U/L (ref 7–177)

## 2011-06-14 LAB — LIPASE, BLOOD: Lipase: 26 U/L (ref 11–59)

## 2011-06-14 MED ORDER — HYDROMORPHONE HCL PF 1 MG/ML IJ SOLN
1.0000 mg | Freq: Once | INTRAMUSCULAR | Status: AC
  Administered 2011-06-14: 1 mg via INTRAVENOUS
  Filled 2011-06-14: qty 1

## 2011-06-14 MED ORDER — ONDANSETRON HCL 4 MG/2ML IJ SOLN
4.0000 mg | Freq: Once | INTRAMUSCULAR | Status: AC
  Administered 2011-06-14: 4 mg via INTRAVENOUS
  Filled 2011-06-14: qty 2

## 2011-06-14 MED ORDER — OXYCODONE HCL 5 MG PO TABA: 5.0000 mg | ORAL_TABLET | Freq: Four times a day (QID) | ORAL | Status: DC | PRN

## 2011-06-14 NOTE — ED Notes (Signed)
Pt complaining of gaseous abdominal pain, PA-C Scarlette Calico

## 2011-06-14 NOTE — ED Notes (Signed)
Family at bedside. 

## 2011-06-15 NOTE — ED Provider Notes (Signed)
History     CSN: [id number]  Arrival date & time 06/13/11  1953   First MD Initiated Contact with Patient 06/13/11 2158      Chief Complaint  Patient presents with  . Chest Pain    (Consider location/radiation/quality/duration/timing/severity/associated sxs/prior treatment) HPI Comments: Patient here with a year history of intermittent right anterior chest wall pain, she states that she has mentioned the pain to numerous doctors in the past, but that no one has been able to tell her why she is having it, she reports that today the pain began to radiate down her right arm and that this then scared her.  She reports shortness of breath when the pain comes on, reports that the pain only lasts about a few minutes and is sharp and stabbing in nature.  Denies nausea, vomiting, diaphoresis.  Has a history of MI with stent placement and reports this feels nothing like that pain, in fact, she has mentioned this pain to Dr. Antoine Poche who does not believe that the pain is related at all.    Patient is a 66 y.o. female presenting with chest pain. The history is provided by the patient. No language interpreter was used.  Chest Pain The chest pain began more than 1 year ago. Duration of episode(s) is 5 minutes. Chest pain occurs intermittently. The chest pain is unchanged. At its most intense, the pain is at 5/10. The pain is currently at 5/10. The severity of the pain is moderate. The quality of the pain is described as sharp and similar to previous episodes. The pain radiates to the right arm. Primary symptoms include shortness of breath. Pertinent negatives for primary symptoms include no fever, no fatigue, no syncope, no cough, no wheezing, no palpitations, no abdominal pain, no nausea, no vomiting, no dizziness and no altered mental status.  Pertinent negatives for associated symptoms include no claudication, no diaphoresis, no lower extremity edema, no near-syncope, no numbness, no orthopnea, no  paroxysmal nocturnal dyspnea and no weakness. She tried nothing for the symptoms. Risk factors include lack of exercise, obesity, post-menopausal and sedentary lifestyle.  Her past medical history is significant for diabetes, hypertension and MI.     Past Medical History  Diagnosis Date  . Diabetes mellitus   . Post-polio syndrome   . Chronic back pain   . Broken heart syndrome   . Hypertension     Past Surgical History  Procedure Date  . Cholecystectomy   . Abdominal hysterectomy   . Knee surgery   . Hand surgery   . Tubal ligation     Family History  Problem Relation Age of Onset  . Heart failure Mother   . Diabetes Father   . Cancer Father   . Heart failure Father     History  Substance Use Topics  . Smoking status: Never Smoker   . Smokeless tobacco: Not on file  . Alcohol Use: No    OB History    Grav Para Term Preterm Abortions TAB SAB Ect Mult Living                  Review of Systems  Constitutional: Negative for fever, diaphoresis and fatigue.  Respiratory: Positive for shortness of breath. Negative for cough and wheezing.   Cardiovascular: Positive for chest pain. Negative for palpitations, orthopnea, claudication, syncope and near-syncope.  Gastrointestinal: Negative for nausea, vomiting and abdominal pain.  Neurological: Negative for dizziness, weakness and numbness.  Psychiatric/Behavioral: Negative for altered mental status.  All  other systems reviewed and are negative.    Allergies  Acetaminophen; Nsaids; Propoxyphene n-acetaminophen; Soma compound; and Sulfa antibiotics  Home Medications   Current Outpatient Rx  Name Route Sig Dispense Refill  . ASPIRIN 81 MG PO TABS Oral Take 160 mg by mouth daily.    Marland Kitchen CALCIUM CARBONATE-VITAMIN D 600-400 MG-UNIT PO CHEW Oral Chew 1 tablet by mouth daily.      Marland Kitchen CITALOPRAM HYDROBROMIDE 40 MG PO TABS Oral Take 40 mg by mouth daily.      Marland Kitchen CLONAZEPAM 0.5 MG PO TABS Oral Take 0.5 mg by mouth 2 (two) times  daily as needed.      Marland Kitchen VITAMIN B 12 PO Oral Take 500 mcg by mouth daily.    . DEXLANSOPRAZOLE 60 MG PO CPDR Oral Take 60 mg by mouth daily.      . ERGOCALCIFEROL 50000 UNITS PO CAPS Oral Take 50,000 Units by mouth every 30 (thirty) days.    . OMEGA-3 FATTY ACIDS 1000 MG PO CAPS Oral Take 2 g by mouth 2 (two) times daily.      Marland Kitchen LEVOTHYROXINE SODIUM 88 MCG PO TABS Oral Take 88 mcg by mouth daily.      Marland Kitchen METFORMIN HCL 500 MG PO TABS Oral Take 1,000 mg by mouth 2 (two) times daily with a meal.      . METOPROLOL SUCCINATE ER 25 MG PO TB24 Oral Take 0.5 mg by mouth daily.      . MULTI-VITAMIN/MINERALS PO TABS Oral Take 1 tablet by mouth daily.      Marland Kitchen SIMVASTATIN 40 MG PO TABS       . TIZANIDINE HCL 4 MG PO TABS Oral Take 4 mg by mouth every 6 (six) hours as needed. PAIN     . TRAMADOL HCL 50 MG PO TABS Oral Take 50 mg by mouth every 6 (six) hours as needed.    . TRIAMTERENE-HCTZ 37.5-25 MG PO TABS Oral Take 0.5 tablets by mouth daily.      Marland Kitchen ZOLPIDEM TARTRATE 10 MG PO TABS Oral Take 10 mg by mouth at bedtime as needed. SLEEP     . FUROSEMIDE 20 MG PO TABS  TAKE 1 TABLET IN THE MORNING. 30 tablet 6  . OXYCODONE HCL (ABUSE DETER) 5 MG PO TABS Oral Take 5 mg by mouth 4 (four) times daily as needed. 30 tablet 0    BP 134/59  Pulse 71  Temp(Src) 98.4 F (36.9 C) (Oral)  Resp 18  Wt 164 lb (74.39 kg)  SpO2 99%  Physical Exam  Nursing note and vitals reviewed. Constitutional: She is oriented to person, place, and time. She appears well-developed and well-nourished. No distress.  HENT:  Head: Normocephalic and atraumatic.  Right Ear: External ear normal.  Left Ear: External ear normal.  Nose: Nose normal.  Mouth/Throat: Oropharynx is clear and moist. No oropharyngeal exudate.  Eyes: Conjunctivae are normal. Pupils are equal, round, and reactive to light. No scleral icterus.  Neck: Normal range of motion. Neck supple.  Cardiovascular: Normal rate, regular rhythm and normal heart sounds.  Exam  reveals no gallop and no friction rub.   No murmur heard. Pulmonary/Chest: Effort normal and breath sounds normal. No respiratory distress. She has no wheezes. She has no rales. She exhibits tenderness.       Right anterior chest ttp - mimics same pain.  Abdominal: Soft. Bowel sounds are normal. She exhibits no distension. There is no tenderness.  Musculoskeletal: She exhibits no edema.  Patient with noted bilateral foot drop and muscle atrophy in bilateral lower ext.  Lymphadenopathy:    She has no cervical adenopathy.  Neurological: She is alert and oriented to person, place, and time. No cranial nerve deficit.  Skin: Skin is warm and dry. No rash noted. No erythema. No pallor.  Psychiatric: She has a normal mood and affect. Her behavior is normal. Judgment and thought content normal.    ED Course  Procedures (including critical care time)  Labs Reviewed  CBC - Abnormal; Notable for the following:    Platelets 404 (*)    All other components within normal limits  DIFFERENTIAL - Abnormal; Notable for the following:    Neutrophils Relative 39 (*)    Lymphs Abs 4.4 (*)    Eosinophils Relative 8 (*)    Eosinophils Absolute 0.8 (*)    All other components within normal limits  COMPREHENSIVE METABOLIC PANEL - Abnormal; Notable for the following:    Sodium 131 (*)    Chloride 91 (*)    Creatinine, Ser 0.38 (*)    All other components within normal limits  CARDIAC PANEL(CRET KIN+CKTOT+MB+TROPI)  LIPASE, BLOOD  D-DIMER, QUANTITATIVE  LAB REPORT - SCANNED   Dg Chest 2 View  06/13/2011  *RADIOLOGY REPORT*  Clinical Data: Right chest and arm pain, shortness of breath, history hypertension, diabetes, post polio syndrome, broken heart syndrome  CHEST - 2 VIEW  Comparison: 06/10/2010  Findings: Biconvex thoracolumbar scoliosis. Normal heart size, mediastinal contours and pulmonary vascularity. Minimal atherosclerotic calcification aortic arch. Lungs clear. No pleural effusion or  pneumothorax. Bones demineralized.  IMPRESSION: Scoliosis. No acute abnormalities.  Original Report Authenticated By: Lollie Marrow, M.D.     1. Atypical chest pain       MDM  Patient with atypical chest pain presentations, two sets of enzymes are negative and the d-dimer is also normal.  I do not believe this to be CAD related chest pain, nor respiratory related, I have discussed this with Dr. Adriana Simas who has seen the patient in conjunction with me.  We believe that she can safely return home and follow up with her PCP about this problem since it has been going on for about a year.        Izola Price [place], [place] 06/15/11 [phone]

## 2011-06-17 NOTE — ED Provider Notes (Signed)
Medical screening examination/treatment/procedure(s) were conducted as a shared visit with non-physician practitioner(s) and myself.  I personally evaluated the patient during the encounter.  Chest pain;  Neg ekg;  Neg enzymes times 2;  Can d/c  Donnetta Hutching, MD 06/17/11 2206

## 2011-08-28 ENCOUNTER — Other Ambulatory Visit: Payer: Self-pay | Admitting: Cardiology

## 2011-11-17 ENCOUNTER — Other Ambulatory Visit: Payer: Self-pay | Admitting: Cardiology

## 2011-11-17 NOTE — Telephone Encounter (Signed)
..   Requested Prescriptions   Pending Prescriptions Disp Refills  . TOPROL XL 25 MG 24 hr tablet [Pharmacy Med Name: TOPROL XL 25 MG TABLET SA] 15 each 2    Sig: TAKE 1/2 TABLET DAILY.

## 2011-12-02 ENCOUNTER — Other Ambulatory Visit: Payer: Self-pay | Admitting: Cardiology

## 2011-12-05 ENCOUNTER — Other Ambulatory Visit: Payer: Self-pay | Admitting: Cardiology

## 2012-01-24 ENCOUNTER — Other Ambulatory Visit: Payer: Self-pay | Admitting: Cardiology

## 2012-01-24 NOTE — Telephone Encounter (Signed)
..   Requested Prescriptions   Pending Prescriptions Disp Refills  . TOPROL XL 25 MG 24 hr tablet [Pharmacy Med Name: TOPROL XL 25 MG TABLET SA] 45 each 2    Sig: TAKE 1/2 TABLET DAILY.  Marland Kitchen.Patient needs to contact office to schedule  Appointment  for future refills.Ph:[phone]. This will be the last refill.  Thank you.

## 2012-02-12 ENCOUNTER — Other Ambulatory Visit: Payer: Self-pay | Admitting: Urology

## 2012-02-12 DIAGNOSIS — R109 Unspecified abdominal pain: Secondary | ICD-10-CM

## 2012-02-13 ENCOUNTER — Other Ambulatory Visit: Payer: Self-pay | Admitting: Cardiology

## 2012-02-14 ENCOUNTER — Ambulatory Visit
Admission: RE | Admit: 2012-02-14 | Discharge: 2012-02-14 | Disposition: A | Discharge status: Home / Self Care | Payer: Medicare Other | Source: Ambulatory Visit | Attending: Urology | Admitting: Urology

## 2012-02-14 DIAGNOSIS — R109 Unspecified abdominal pain: Secondary | ICD-10-CM

## 2012-02-14 NOTE — Telephone Encounter (Signed)
..   Requested Prescriptions   Pending Prescriptions Disp Refills  . furosemide (LASIX) 20 MG tablet [Pharmacy Med Name: FUROSEMIDE 20 MG TABLET] 90 tablet 0    Sig: TAKE 1 TABLET IN THE MORNING.  Marland Kitchen.Patient needs to contact office to schedule  Appointment  for future refills.Ph:[phone]. Thank you.

## 2012-02-21 ENCOUNTER — Other Ambulatory Visit: Payer: Self-pay | Admitting: Internal Medicine

## 2012-02-21 DIAGNOSIS — Z1231 Encounter for screening mammogram for malignant neoplasm of breast: Secondary | ICD-10-CM

## 2012-03-26 ENCOUNTER — Ambulatory Visit: Payer: Medicare Other

## 2012-03-28 ENCOUNTER — Ambulatory Visit
Admission: RE | Admit: 2012-03-28 | Discharge: 2012-03-28 | Disposition: A | Discharge status: Home / Self Care | Payer: Medicare Other | Source: Ambulatory Visit | Attending: Internal Medicine | Admitting: Internal Medicine

## 2012-03-28 DIAGNOSIS — Z1231 Encounter for screening mammogram for malignant neoplasm of breast: Secondary | ICD-10-CM

## 2012-05-01 ENCOUNTER — Other Ambulatory Visit: Payer: Self-pay

## 2012-05-01 ENCOUNTER — Other Ambulatory Visit (HOSPITAL_COMMUNITY): Payer: Self-pay | Admitting: Cardiology

## 2012-05-01 NOTE — Telephone Encounter (Signed)
error 

## 2012-05-02 MED ORDER — FUROSEMIDE 20 MG PO TABS: 20.0000 mg | ORAL_TABLET | Freq: Every day | ORAL | Status: DC

## 2012-05-02 NOTE — Addendum Note (Signed)
Addended by: Reine Just on: 05/02/2012 01:49 PM   Modules accepted: Orders

## 2012-05-09 ENCOUNTER — Other Ambulatory Visit: Payer: Self-pay | Admitting: Cardiology

## 2012-06-03 ENCOUNTER — Encounter: Payer: Self-pay | Admitting: Cardiology

## 2012-06-03 ENCOUNTER — Ambulatory Visit (INDEPENDENT_AMBULATORY_CARE_PROVIDER_SITE_OTHER): Payer: Medicare Other | Admitting: Cardiology

## 2012-06-03 VITALS — BP 142/78 | HR 65 | Ht 60.0 in | Wt 176.0 lb

## 2012-06-03 DIAGNOSIS — I10 Essential (primary) hypertension: Secondary | ICD-10-CM

## 2012-06-03 DIAGNOSIS — I4891 Unspecified atrial fibrillation: Secondary | ICD-10-CM

## 2012-06-03 DIAGNOSIS — I5181 Takotsubo syndrome: Secondary | ICD-10-CM

## 2012-06-03 NOTE — Progress Notes (Signed)
HPI The patient presents for followup of previous cardiomyopathy. She was noted in the past to have an EF of 45%.ultimately to the Takotsubo's cardiomyopathy. In 2010 when this was noted catheterization demonstrated only mild coronary plaque. Followup echocardiography in 2011 demonstrated the EF to be normal. She has had no symptoms consistent with heart failure. She's very limited in her activities getting around in a wheelchair following a back accident on top of her history of polio. She denies any new shortness of breath, PND or orthopnea. She has no palpitations, presyncope or syncope. She has no chest pressure, neck or arm discomfort.   Allergies  Allergen Reactions  . Acetaminophen Itching  . Nsaids   . Propoxyphene-Acetaminophen   . Soma Compound (Carisoprodol-Aspirin)   . Sulfa Antibiotics     Current Outpatient Prescriptions  Medication Sig Dispense Refill  . aspirin 81 MG tablet Take 160 mg by mouth daily.      . Calcium Carbonate-Vitamin D (CALTRATE 600+D) 600-400 MG-UNIT per chew tablet Chew 1 tablet by mouth daily.        . citalopram (CELEXA) 40 MG tablet Take 40 mg by mouth daily.        . clonazePAM (KLONOPIN) 0.5 MG tablet Take 0.5 mg by mouth 2 (two) times daily as needed.        . Cyanocobalamin (VITAMIN B 12 PO) Take 500 mcg by mouth daily.      Marland Kitchen dexlansoprazole (DEXILANT) 60 MG capsule Take 60 mg by mouth daily.        . ergocalciferol (VITAMIN D2) 50000 UNITS capsule Take 50,000 Units by mouth every 30 (thirty) days.      . fish oil-omega-3 fatty acids 1000 MG capsule Take 2 g by mouth 2 (two) times daily.        . furosemide (LASIX) 20 MG tablet TAKE 1 TABLET IN THE MORNING.  90 tablet  1  . hydrOXYzine (ATARAX/VISTARIL) 25 MG tablet Take 25 mg by mouth 3 (three) times daily as needed.      Marland Kitchen levothyroxine (SYNTHROID, LEVOTHROID) 88 MCG tablet Take 88 mcg by mouth daily.        . metFORMIN (GLUCOPHAGE) 500 MG tablet Take 1,000 mg by mouth 2 (two) times daily  with a meal.        . Multiple Vitamins-Minerals (MULTIVITAMIN WITH MINERALS) tablet Take 1 tablet by mouth daily.        . simvastatin (ZOCOR) 40 MG tablet        . tiZANidine (ZANAFLEX) 4 MG tablet Take 4 mg by mouth every 6 (six) hours as needed. PAIN       . TOPROL XL 25 MG 24 hr tablet TAKE 1/2 TABLET DAILY.  45 each  1  . traMADol (ULTRAM) 50 MG tablet Take 50 mg by mouth every 6 (six) hours as needed.      . triamterene-hydrochlorothiazide (MAXZIDE-25) 37.5-25 MG per tablet Take 0.5 tablets by mouth daily.        Marland Kitchen zolpidem (AMBIEN) 10 MG tablet Take 10 mg by mouth at bedtime as needed. SLEEP         Past Medical History  Diagnosis Date  . Diabetes mellitus   . Post-polio syndrome   . Chronic back pain   . Broken heart syndrome   . Hypertension     Past Surgical History  Procedure Date  . Cholecystectomy   . Abdominal hysterectomy   . Knee surgery   . Hand surgery   . Tubal  ligation     ROS:  As stated in the HPI and negative for all other systems.  PHYSICAL EXAM BP 142/78  Pulse 65  Ht 5' (1.524 m)  Wt 176 lb (79.833 kg)  BMI 34.37 kg/m2 GEN:  No distress NECK:  No jugular venous distention at 90 degrees, waveform within normal limits, carotid upstroke brisk and symmetric, no bruits, no thyromegaly LYMPHATICS:  No cervical adenopathy LUNGS:  Clear to auscultation bilaterally BACK:  No CVA tenderness CHEST:  Unremarkable HEART:  S1 and S2 within normal limits, no S3, no S4, no clicks, no rubs, no murmurs ABD:  Positive bowel sounds normal in frequency in pitch, no bruits, no rebound, no guarding, unable to assess midline mass or bruit with the patient seated. EXT:  2 plus pulses throughout, trace bilateral edema, no cyanosis no clubbing SKIN:  No rashes no nodules NEURO:  Cranial nerves II through XII grossly intact, lower extremity diffuse motor weakness PSYCH:  Cognitively intact, oriented to person place and time  EKG:  Sinus rhythm, rate 65, axis within  normal limits, intervals within normal limits, poor anterior R wave progression, low-voltage. 06/03/2012  ASSESSMENT AND PLAN  Takotsubo cardiomyopathy: She had evidence of this in 2010 the by 2011 the EF was normal. She's had no new symptoms. She had no coronary disease at the time of cath 2010. At this point no change in therapy or further cardiovascular testing is indicated.  Hypertension: Her blood pressures typically well: The meds as listed. She can continue with this.

## 2012-06-03 NOTE — Patient Instructions (Addendum)
The current medical regimen is effective;  continue present plan and medications.  Follow up as needed with Dr Hochrein 

## 2012-07-27 ENCOUNTER — Emergency Department (HOSPITAL_COMMUNITY)
Admission: EM | Admit: 2012-07-27 | Discharge: 2012-07-27 | Disposition: A | Discharge status: Home / Self Care | Payer: Medicare Other | Attending: Emergency Medicine | Admitting: Emergency Medicine

## 2012-07-27 ENCOUNTER — Encounter (HOSPITAL_COMMUNITY): Payer: Self-pay | Admitting: Neurology

## 2012-07-27 ENCOUNTER — Telehealth: Payer: Self-pay | Admitting: Nurse Practitioner

## 2012-07-27 ENCOUNTER — Emergency Department (HOSPITAL_COMMUNITY): Payer: Medicare Other

## 2012-07-27 DIAGNOSIS — E119 Type 2 diabetes mellitus without complications: Secondary | ICD-10-CM | POA: Insufficient documentation

## 2012-07-27 DIAGNOSIS — R0602 Shortness of breath: Secondary | ICD-10-CM | POA: Insufficient documentation

## 2012-07-27 DIAGNOSIS — Z8739 Personal history of other diseases of the musculoskeletal system and connective tissue: Secondary | ICD-10-CM | POA: Insufficient documentation

## 2012-07-27 DIAGNOSIS — G8929 Other chronic pain: Secondary | ICD-10-CM | POA: Insufficient documentation

## 2012-07-27 DIAGNOSIS — R609 Edema, unspecified: Secondary | ICD-10-CM | POA: Insufficient documentation

## 2012-07-27 DIAGNOSIS — M25519 Pain in unspecified shoulder: Secondary | ICD-10-CM | POA: Insufficient documentation

## 2012-07-27 DIAGNOSIS — M542 Cervicalgia: Secondary | ICD-10-CM | POA: Insufficient documentation

## 2012-07-27 DIAGNOSIS — R0789 Other chest pain: Secondary | ICD-10-CM | POA: Insufficient documentation

## 2012-07-27 DIAGNOSIS — M549 Dorsalgia, unspecified: Secondary | ICD-10-CM | POA: Insufficient documentation

## 2012-07-27 DIAGNOSIS — Z8612 Personal history of poliomyelitis: Secondary | ICD-10-CM | POA: Insufficient documentation

## 2012-07-27 DIAGNOSIS — I10 Essential (primary) hypertension: Secondary | ICD-10-CM | POA: Insufficient documentation

## 2012-07-27 DIAGNOSIS — Z8679 Personal history of other diseases of the circulatory system: Secondary | ICD-10-CM | POA: Insufficient documentation

## 2012-07-27 DIAGNOSIS — Z79899 Other long term (current) drug therapy: Secondary | ICD-10-CM | POA: Insufficient documentation

## 2012-07-27 DIAGNOSIS — R600 Localized edema: Secondary | ICD-10-CM

## 2012-07-27 DIAGNOSIS — Z7982 Long term (current) use of aspirin: Secondary | ICD-10-CM | POA: Insufficient documentation

## 2012-07-27 LAB — POCT I-STAT TROPONIN I: Troponin i, poc: 0 ng/mL (ref 0.00–0.08)

## 2012-07-27 LAB — CBC WITH DIFFERENTIAL/PLATELET
Basophils Absolute: 0 10*3/uL (ref 0.0–0.1)
Eosinophils Relative: 6 % — ABNORMAL HIGH (ref 0–5)
Lymphocytes Relative: 39 % (ref 12–46)
MCV: 85.1 fL (ref 78.0–100.0)
Neutro Abs: 3.1 10*3/uL (ref 1.7–7.7)
Neutrophils Relative %: 43 % (ref 43–77)
Platelets: 326 10*3/uL (ref 150–400)
RDW: 13.4 % (ref 11.5–15.5)
WBC: 7.1 10*3/uL (ref 4.0–10.5)

## 2012-07-27 LAB — D-DIMER, QUANTITATIVE: D-Dimer, Quant: 0.28 ug/mL-FEU (ref 0.00–0.48)

## 2012-07-27 LAB — POCT I-STAT, CHEM 8
BUN: 5 mg/dL — ABNORMAL LOW (ref 6–23)
Sodium: 134 mEq/L — ABNORMAL LOW (ref 135–145)
TCO2: 27 mmol/L (ref 0–100)

## 2012-07-27 MED ORDER — TRAMADOL HCL 50 MG PO TABS
50.0000 mg | ORAL_TABLET | Freq: Once | ORAL | Status: AC
  Administered 2012-07-27: 50 mg via ORAL
  Filled 2012-07-27: qty 1

## 2012-07-27 NOTE — ED Provider Notes (Signed)
History     CSN: [id number]  Arrival date & time 07/27/12  1708   First MD Initiated Contact with Patient 07/27/12 1730     Chief Complaint  Patient presents with  . Chest Pain   HPI  67 y/o who presents with cc of chest pain and bilateral leg swelling. Her symptoms began approximately 3 days ago. The patient was returning from [place] by car when her symptoms began. She states she is having left sided chest pain that she describes as "squeezing". She states her pain has been constant. In addition, her neck, bilateral shoulders, and upper back are also sore. She state she has noticed an increase in swelling of both of her legs. She was recently taken off her Maxzide by her primary care physician but states she started to notice her legs swell while returning back from [place]. She has a mild amount of associated shortness of breath.   Past Medical History  Diagnosis Date  . Diabetes mellitus   . Post-polio syndrome   . Chronic back pain   . Broken heart syndrome   . Hypertension     Past Surgical History  Procedure Laterality Date  . Cholecystectomy    . Abdominal hysterectomy    . Knee surgery    . Hand surgery    . Tubal ligation      Family History  Problem Relation Age of Onset  . Heart failure Mother   . Diabetes Father   . Cancer Father   . Heart failure Father     History  Substance Use Topics  . Smoking status: Never Smoker   . Smokeless tobacco: Not on file  . Alcohol Use: No    OB History   Grav Para Term Preterm Abortions TAB SAB Ect Mult Living                  Review of Systems  Constitutional: Negative for fever and chills.  HENT: Positive for neck pain.   Respiratory: Positive for shortness of breath.   Cardiovascular: Positive for chest pain and leg swelling.  Gastrointestinal: Negative for nausea, vomiting and abdominal pain.  Musculoskeletal: Positive for back pain and arthralgias (bilateral shoulder).  All other systems reviewed and are  negative.    Allergies  Ciprofloxacin; Acetaminophen; Aspirin; Nsaids; Propoxyphene-acetaminophen; Soma compound; Sulfa antibiotics; and Tolectin  Home Medications   Current Outpatient Rx  Name  Route  Sig  Dispense  Refill  . aspirin 81 MG tablet   Oral   Take 81 mg by mouth at bedtime.          . Calcium Carbonate-Vitamin D (CALTRATE 600+D) 600-400 MG-UNIT per chew tablet   Oral   Chew 1 tablet by mouth 2 (two) times daily.          . citalopram (CELEXA) 40 MG tablet   Oral   Take 40 mg by mouth daily.           . clonazePAM (KLONOPIN) 0.5 MG tablet   Oral   Take 0.5 mg by mouth 2 (two) times daily as needed.           . Cyanocobalamin (VITAMIN B 12 PO)   Oral   Take 500 mcg by mouth daily.         Marland Kitchen dexlansoprazole (DEXILANT) 60 MG capsule   Oral   Take 60 mg by mouth at bedtime.          . ergocalciferol (VITAMIN D2) 50000 UNITS capsule  Oral   Take 50,000 Units by mouth every 30 (thirty) days.         . fish oil-omega-3 fatty acids 1000 MG capsule   Oral   Take 2 g by mouth 2 (two) times daily.           . furosemide (LASIX) 20 MG tablet   Oral   Take 20 mg by mouth every morning.         . hydrOXYzine (ATARAX/VISTARIL) 25 MG tablet   Oral   Take 25 mg by mouth 3 (three) times daily as needed for itching. For itching         . levothyroxine (SYNTHROID, LEVOTHROID) 88 MCG tablet   Oral   Take 88 mcg by mouth daily.           . metFORMIN (GLUCOPHAGE) 500 MG tablet   Oral   Take 1,000 mg by mouth 2 (two) times daily with a meal.           . metoprolol succinate (TOPROL-XL) 25 MG 24 hr tablet   Oral   Take 12.5 mg by mouth daily.         . Multiple Vitamins-Minerals (MULTIVITAMIN WITH MINERALS) tablet   Oral   Take 1 tablet by mouth daily.           . nitroGLYCERIN (NITROSTAT) 0.4 MG SL tablet   Sublingual   Place 0.4 mg under the tongue every 5 (five) minutes as needed for chest pain.         . simvastatin (ZOCOR)  40 MG tablet   Oral   Take 20 mg by mouth at bedtime.          Marland Kitchen tiZANidine (ZANAFLEX) 4 MG tablet   Oral   Take 4 mg by mouth every 6 (six) hours as needed. PAIN          . traMADol (ULTRAM) 50 MG tablet   Oral   Take 50 mg by mouth every 6 (six) hours as needed for pain. For pain         . triamterene-hydrochlorothiazide (MAXZIDE-25) 37.5-25 MG per tablet   Oral   Take 0.5 tablets by mouth daily.           Marland Kitchen zolpidem (AMBIEN) 10 MG tablet   Oral   Take 10 mg by mouth at bedtime.          Marland Kitchen aspirin 81 MG chewable tablet   Oral   Chew 324 mg by mouth once.           BP 105/51  Pulse 55  Resp 18  SpO2 99%  Physical Exam  Nursing note and vitals reviewed. Constitutional: She is oriented to person, place, and time. She appears well-developed and well-nourished. No distress.  HENT:  Head: Normocephalic and atraumatic.  Mouth/Throat: No oropharyngeal exudate.  Eyes: Conjunctivae are normal. Pupils are equal, round, and reactive to light.  Neck: Normal range of motion. Neck supple. Muscular tenderness present.  Cardiovascular: Normal rate, regular rhythm and normal heart sounds.  Exam reveals no gallop and no friction rub.   No murmur heard. Pulses:      Radial pulses are 2+ on the right side, and 2+ on the left side.       Dorsalis pedis pulses are 2+ on the right side, and 2+ on the left side.       Posterior tibial pulses are 2+ on the right side, and 2+ on the left side.  Pulmonary/Chest:  Effort normal and breath sounds normal. She exhibits tenderness.    Abdominal: Soft. Bowel sounds are normal.  Musculoskeletal: Normal range of motion. She exhibits edema (trace pedal).  Neurological: She is alert and oriented to person, place, and time. No sensory deficit. She exhibits abnormal muscle tone (decreased tone bilateral lower extremities). GCS eye subscore is 4. GCS verbal subscore is 5. GCS motor subscore is 6.  Skin: Skin is warm and dry.  Psychiatric: She  has a normal mood and affect.    ED Course  Procedures (including critical care time)  Labs Reviewed  CBC WITH DIFFERENTIAL - Abnormal; Notable for the following:    HCT 35.4 (*)    Eosinophils Relative 6 (*)    All other components within normal limits  POCT I-STAT, CHEM 8 - Abnormal; Notable for the following:    Sodium 134 (*)    BUN 5 (*)    All other components within normal limits  D-DIMER, QUANTITATIVE  POCT I-STAT TROPONIN I   Dg Chest 2 View  07/27/2012  *RADIOLOGY REPORT*  Clinical Data: Chest pain.  Short of breath.  Hypertension.  CHEST - 2 VIEW  Comparison: 06/13/2011  Findings: Heart size is normal.  The aorta shows some calcification.  Mediastinal shadows are unremarkable except for spinal curvature.  Interstitial markings are slightly prominent. This could indicate bronchitis.  The vascularity does not appear to show cephalization.  No effusions.  No acute bony finding.  IMPRESSION: Scoliosis.  Slightly prominent interstitial markings.  Question bronchitis.   Original Report Authenticated By: Paulina Fusi, M.D.    1. Muscular chest pain   2. Neck pain   3. Back pain   4. Pedal edema     MDM  67 y/o who presents with cc of chest pain, bilateral shoulder pain, neck pain and bilateral leg swelling. HDS. Afebrile. Well appearing. Chest pain, shoulder pain, and neck pain reproducible with palpation. Doubt ACS given recent non-obstructive cath, pain is constant and non-exertional, non-ischemic EKG, and negative troponin. Doubt Pneumonia, pneumothorax, or dissection. Labs unremarkable. Risk factors for PE are prolonged immobilization/recent travel. Felt to be low risk. D-dimer negative so have low suspicion for PE as the cause of the patient's symptoms. Likely musculoskeletal pain. No evidence of symptoms of heart failure. Only mild trace edema on exam. Will refer back to pcp as to wether or not the patient should restart maxazide.         Shanon Ace, MD 07/27/12  [phone]

## 2012-07-27 NOTE — ED Notes (Signed)
Patient transported to X-ray 

## 2012-07-27 NOTE — ED Notes (Signed)
Per EMS-Pt comes from home where she complains of chest pain, back, neck pain since Thursday. States HCTZ dosed decreased by half last week. Has 2+ pitting edema in BLE. Denies n.v.d. Traveled to [place] last week by car. Self administered 324 aspirin, given 1 nitro by EMS dropped BP to 98/54. EKG SB. Pt is alert and oriented. Skin is warm and dry.

## 2012-07-27 NOTE — Telephone Encounter (Signed)
Pt called this afternoon stating that she recently vacationed in [place] and while there (drove there), began to experience bilat LE edema.  On her drive back, she began to note intermittent chest pain and nausea.  She just got back into town on Thursday and symptoms have been intermittent since, while LEE persists.  I advised that given her h/o recent prolonged travel as well as takotsubo cardiomyopathy, she present to the ED for evaluation.  She verbalized understanding.

## 2012-07-27 NOTE — ED Provider Notes (Signed)
I saw and evaluated the patient, reviewed the resident's note and I agree with the findings and plan.  This 67 year old female has a history of polio and recently returned a few days ago from a trip to [place] in the car, she has constant chest back and neck pain tenderness and reproducible tenderness to palpation to my examination here in the emergency department, clinically I doubt acute coronary syndrome and believe she is at low risk of pulmonary embolism and feel a d-dimer is a reasonable test in this patient given her recent travel history.  ECG: Sinus rhythm, ventricular rate 55, normal axis, normal intervals, no acute ischemic changes noted, no significant change compared with January 2014  Hurman Horn, MD 07/28/12 0120

## 2012-08-13 ENCOUNTER — Other Ambulatory Visit: Payer: Self-pay | Admitting: Cardiology

## 2012-08-13 NOTE — Telephone Encounter (Signed)
..   Requested Prescriptions   Pending Prescriptions Disp Refills  . metoprolol succinate (TOPROL-XL) 25 MG 24 hr tablet [Pharmacy Med Name: METOPROLOL SUCC ER 25 MG TAB] 45 tablet 6    Sig: TAKE 1/2 TABLET DAILY.

## 2012-11-04 ENCOUNTER — Other Ambulatory Visit: Payer: Self-pay | Admitting: Cardiology

## 2012-11-05 ENCOUNTER — Ambulatory Visit
Admission: RE | Admit: 2012-11-05 | Discharge: 2012-11-05 | Disposition: A | Discharge status: Home / Self Care | Payer: Medicare Other | Source: Ambulatory Visit | Attending: Orthopedic Surgery | Admitting: Orthopedic Surgery

## 2012-11-05 ENCOUNTER — Other Ambulatory Visit: Payer: Self-pay | Admitting: Orthopedic Surgery

## 2012-11-05 DIAGNOSIS — R52 Pain, unspecified: Secondary | ICD-10-CM

## 2013-02-19 ENCOUNTER — Other Ambulatory Visit: Payer: Self-pay

## 2013-02-19 DIAGNOSIS — Z1231 Encounter for screening mammogram for malignant neoplasm of breast: Secondary | ICD-10-CM

## 2013-03-31 ENCOUNTER — Ambulatory Visit
Admission: RE | Admit: 2013-03-31 | Discharge: 2013-03-31 | Disposition: A | Discharge status: Home / Self Care | Payer: Medicare Other | Source: Ambulatory Visit

## 2013-03-31 ENCOUNTER — Other Ambulatory Visit: Payer: Self-pay | Admitting: Urology

## 2013-03-31 DIAGNOSIS — Z1231 Encounter for screening mammogram for malignant neoplasm of breast: Secondary | ICD-10-CM

## 2013-03-31 DIAGNOSIS — N3090 Cystitis, unspecified without hematuria: Secondary | ICD-10-CM

## 2013-04-03 ENCOUNTER — Other Ambulatory Visit: Payer: Self-pay

## 2013-04-09 ENCOUNTER — Ambulatory Visit
Admission: RE | Admit: 2013-04-09 | Discharge: 2013-04-09 | Disposition: A | Discharge status: Home / Self Care | Payer: Medicare Other | Source: Ambulatory Visit | Attending: Urology | Admitting: Urology

## 2013-04-09 DIAGNOSIS — N3090 Cystitis, unspecified without hematuria: Secondary | ICD-10-CM

## 2013-06-30 ENCOUNTER — Emergency Department (HOSPITAL_BASED_OUTPATIENT_CLINIC_OR_DEPARTMENT_OTHER): Payer: Medicare Other

## 2013-06-30 ENCOUNTER — Emergency Department (HOSPITAL_BASED_OUTPATIENT_CLINIC_OR_DEPARTMENT_OTHER)
Admission: EM | Admit: 2013-06-30 | Discharge: 2013-06-30 | Disposition: A | Discharge status: Home / Self Care | Payer: Medicare Other | Attending: Emergency Medicine | Admitting: Emergency Medicine

## 2013-06-30 ENCOUNTER — Encounter (HOSPITAL_BASED_OUTPATIENT_CLINIC_OR_DEPARTMENT_OTHER): Payer: Self-pay | Admitting: Emergency Medicine

## 2013-06-30 DIAGNOSIS — S0990XA Unspecified injury of head, initial encounter: Secondary | ICD-10-CM | POA: Insufficient documentation

## 2013-06-30 DIAGNOSIS — E119 Type 2 diabetes mellitus without complications: Secondary | ICD-10-CM | POA: Insufficient documentation

## 2013-06-30 DIAGNOSIS — Y939 Activity, unspecified: Secondary | ICD-10-CM | POA: Insufficient documentation

## 2013-06-30 DIAGNOSIS — S8392XA Sprain of unspecified site of left knee, initial encounter: Secondary | ICD-10-CM

## 2013-06-30 DIAGNOSIS — W1809XA Striking against other object with subsequent fall, initial encounter: Secondary | ICD-10-CM | POA: Insufficient documentation

## 2013-06-30 DIAGNOSIS — IMO0002 Reserved for concepts with insufficient information to code with codable children: Secondary | ICD-10-CM | POA: Insufficient documentation

## 2013-06-30 DIAGNOSIS — I10 Essential (primary) hypertension: Secondary | ICD-10-CM | POA: Insufficient documentation

## 2013-06-30 DIAGNOSIS — G8929 Other chronic pain: Secondary | ICD-10-CM | POA: Insufficient documentation

## 2013-06-30 DIAGNOSIS — Z8619 Personal history of other infectious and parasitic diseases: Secondary | ICD-10-CM | POA: Insufficient documentation

## 2013-06-30 DIAGNOSIS — Z7982 Long term (current) use of aspirin: Secondary | ICD-10-CM | POA: Insufficient documentation

## 2013-06-30 DIAGNOSIS — Z79899 Other long term (current) drug therapy: Secondary | ICD-10-CM | POA: Insufficient documentation

## 2013-06-30 DIAGNOSIS — Y9210 Unspecified residential institution as the place of occurrence of the external cause: Secondary | ICD-10-CM | POA: Insufficient documentation

## 2013-06-30 DIAGNOSIS — W19XXXA Unspecified fall, initial encounter: Secondary | ICD-10-CM

## 2013-06-30 MED ORDER — OXYCODONE HCL 5 MG PO TABS: 5.0000 mg | ORAL_TABLET | ORAL | Status: DC | PRN

## 2013-06-30 MED ORDER — HYDROMORPHONE HCL PF 2 MG/ML IJ SOLN
2.0000 mg | Freq: Once | INTRAMUSCULAR | Status: AC
  Administered 2013-06-30: 2 mg via INTRAMUSCULAR
  Filled 2013-06-30: qty 1

## 2013-06-30 NOTE — ED Provider Notes (Signed)
CSN: [id number]     Arrival date & time 06/30/13  1404 History   First MD Initiated Contact with Patient 06/30/13 1430     Chief Complaint  Patient presents with  . Fall     (Consider location/radiation/quality/duration/timing/severity/associated sxs/prior Treatment) HPI Comments: Patient is a 68 year old female with history of polio. She with to radiology for an x-ray of her chest. While this was being performed she apparently and injured her knee. She also fell backward and struck her head on the floor. There is no loss of consciousness but she is describing headache. She denies neck pain or back pain.  Patient is a 68 y.o. female presenting with fall. The history is provided by the patient.  Fall This is a new problem. The current episode started 1 to 2 hours ago. The problem occurs constantly. The problem has not changed since onset.Associated symptoms include headaches. Nothing aggravates the symptoms. Nothing relieves the symptoms. She has tried nothing for the symptoms. The treatment provided no relief.    Past Medical History  Diagnosis Date  . Diabetes mellitus   . Post-polio syndrome   . Chronic back pain   . Broken heart syndrome   . Hypertension    Past Surgical History  Procedure Laterality Date  . Cholecystectomy    . Abdominal hysterectomy    . Knee surgery    . Hand surgery    . Tubal ligation     Family History  Problem Relation Age of Onset  . Heart failure Mother   . Diabetes Father   . Cancer Father   . Heart failure Father    History  Substance Use Topics  . Smoking status: Never Smoker   . Smokeless tobacco: Not on file  . Alcohol Use: No   OB History   Grav Para Term Preterm Abortions TAB SAB Ect Mult Living                 Review of Systems  Neurological: Positive for headaches.  All other systems reviewed and are negative.      Allergies  Ciprofloxacin; Acetaminophen; Aspirin; Nsaids; Propoxyphene n-acetaminophen; Soma compound;  Sulfa antibiotics; and Tolectin  Home Medications   Current Outpatient Rx  Name  Route  Sig  Dispense  Refill  . aspirin 81 MG chewable tablet   Oral   Chew 324 mg by mouth once.         Marland Kitchen aspirin 81 MG tablet   Oral   Take 81 mg by mouth at bedtime.          . Calcium Carbonate-Vitamin D (CALTRATE 600+D) 600-400 MG-UNIT per chew tablet   Oral   Chew 1 tablet by mouth 2 (two) times daily.          . citalopram (CELEXA) 40 MG tablet   Oral   Take 40 mg by mouth daily.           . clonazePAM (KLONOPIN) 0.5 MG tablet   Oral   Take 0.5 mg by mouth 2 (two) times daily as needed.           . Cyanocobalamin (VITAMIN B 12 PO)   Oral   Take 500 mcg by mouth daily.         Marland Kitchen dexlansoprazole (DEXILANT) 60 MG capsule   Oral   Take 60 mg by mouth at bedtime.          . ergocalciferol (VITAMIN D2) 50000 UNITS capsule   Oral   Take  50,000 Units by mouth every 30 (thirty) days.         . fish oil-omega-3 fatty acids 1000 MG capsule   Oral   Take 2 g by mouth 2 (two) times daily.           . furosemide (LASIX) 20 MG tablet      TAKE 1 TABLET IN THE MORNING.   90 tablet   3   . hydrOXYzine (ATARAX/VISTARIL) 25 MG tablet   Oral   Take 25 mg by mouth 3 (three) times daily as needed for itching. For itching         . levothyroxine (SYNTHROID, LEVOTHROID) 88 MCG tablet   Oral   Take 88 mcg by mouth daily.           . metFORMIN (GLUCOPHAGE) 500 MG tablet   Oral   Take 1,000 mg by mouth 2 (two) times daily with a meal.           . metoprolol succinate (TOPROL-XL) 25 MG 24 hr tablet      TAKE 1/2 TABLET DAILY.   45 tablet   6   . Multiple Vitamins-Minerals (MULTIVITAMIN WITH MINERALS) tablet   Oral   Take 1 tablet by mouth daily.           . nitroGLYCERIN (NITROSTAT) 0.4 MG SL tablet   Sublingual   Place 0.4 mg under the tongue every 5 (five) minutes as needed for chest pain.         . simvastatin (ZOCOR) 40 MG tablet   Oral   Take 20 mg  by mouth at bedtime.          Marland Kitchen tiZANidine (ZANAFLEX) 4 MG tablet   Oral   Take 4 mg by mouth every 6 (six) hours as needed. PAIN          . traMADol (ULTRAM) 50 MG tablet   Oral   Take 50 mg by mouth every 6 (six) hours as needed for pain. For pain         . triamterene-hydrochlorothiazide (MAXZIDE-25) 37.5-25 MG per tablet   Oral   Take 0.5 tablets by mouth daily.           Marland Kitchen zolpidem (AMBIEN) 10 MG tablet   Oral   Take 10 mg by mouth at bedtime.           BP 160/81  Pulse 77  Temp(Src) 98.4 F (36.9 C) (Oral)  Resp 18  Ht 5' (1.524 m)  Wt 171 lb (77.565 kg)  BMI 33.40 kg/m2  SpO2 98% Physical Exam  Nursing note and vitals reviewed. Constitutional: She is oriented to person, place, and time. She appears well-developed and well-nourished. No distress.  HENT:  Head: Normocephalic and atraumatic.  Mouth/Throat: Oropharynx is clear and moist.  Eyes: EOM are normal. Pupils are equal, round, and reactive to light.  Neck: Normal range of motion. Neck supple.  Musculoskeletal:  There is swelling to the left knee. Exam is limited due to pain.  Neurological: She is alert and oriented to person, place, and time. No cranial nerve deficit. Coordination normal.  Skin: Skin is warm and dry. She is not diaphoretic.    ED Course  Procedures (including critical care time) Labs Review Labs Reviewed - No data to display Imaging Review No results found.    MDM   Final diagnoses:  None    Patient is a 68 year old female with history of polio. She was having a chest x-ray at  the doctor's office when she fell and injured her left knee. This is the knee that she normally uses to stand and pivot. X-rays reveal no evidence for acute fracture, but do show significant degeneration of the knee joint. CT of the head is unremarkable. The patient is quite adamant about returning home. She will be prescribed pain medication and is in need of a mobilizer will be  applied.    Veryl Speak, MD 06/30/13 [phone]

## 2013-06-30 NOTE — Discharge Instructions (Signed)
Wear knee immobilizer. Ice 20 minutes every 2 hours while awake for the next 2 days.  Percocet as needed for pain.  Followup with your primary Dr. if not improving in 1 week.   Knee Sprain A knee sprain is a tear in one of the strong, fibrous tissues that connect the bones (ligaments) in your knee. The severity of the sprain depends on how much of the ligament is torn. The tear can be either partial or complete. CAUSES  Often, sprains are a result of a fall or injury. The force of the impact causes the fibers of your ligament to stretch too much. This excess tension causes the fibers of your ligament to tear. SIGNS AND SYMPTOMS  You may have some loss of motion in your knee. Other symptoms include:  Bruising.  Pain in the knee area.  Tenderness of the knee to the touch.  Swelling. DIAGNOSIS  To diagnose a knee sprain, your health care provider will physically examine your knee. Your health care provider may also suggest an X-ray exam of your knee to make sure no bones are broken. TREATMENT  If your ligament is only partially torn, treatment usually involves keeping the knee in a fixed position (immobilization) or bracing your knee for activities that require movement for several weeks. To do this, your health care provider will apply a bandage, cast, or splint to keep your knee from moving and to support your knee during movement until it heals. For a partially torn ligament, the healing process usually takes 4 6 weeks. If your ligament is completely torn, depending on which ligament it is, you may need surgery to reconnect the ligament to the bone or reconstruct it. After surgery, a cast or splint may be applied and will need to stay on your knee for 4 6 weeks while your ligament heals. HOME CARE INSTRUCTIONS  Keep your injured knee elevated to decrease swelling.  To ease pain and swelling, apply ice to the injured area:  Put ice in a plastic bag.  Place a towel between your skin  and the bag.  Leave the ice on for 20 minutes, 2 3 times a day.  Only take medicine for pain as directed by your health care provider.  Do not leave your knee unprotected until pain and stiffness go away (usually 4 6 weeks).  If you have a cast or splint, do not allow it to get wet. If you have been instructed not to remove it, cover it with a plastic bag when you shower or bathe. Do not swim.  Your health care provider may suggest exercises for you to do during your recovery to prevent or limit permanent weakness and stiffness. SEEK IMMEDIATE MEDICAL CARE IF:  Your cast or splint becomes damaged.  Your pain becomes worse.  You have significant pain, swelling, or numbness below the cast or splint. MAKE SURE YOU:  Understand these instructions.  Will watch your condition.  Will get help right away if you are not doing well or get worse. Document Released: 05/01/2005 Document Revised: 02/19/2013 Document Reviewed: 12/11/2012 [facility] Patient Information 2014 [place].  Head Injury, Adult You have received a head injury. It does not appear serious at this time. Headaches and vomiting are common following head injury. It should be easy to awaken from sleeping. Sometimes it is necessary for you to stay in the emergency department for a while for observation. Sometimes admission to the hospital may be needed. After injuries such as yours, most problems  occur within the first 24 hours, but side effects may occur up to 7 10 days after the injury. It is important for you to carefully monitor your condition and contact your health care provider or seek immediate medical care if there is a change in your condition. WHAT ARE THE TYPES OF HEAD INJURIES? Head injuries can be as minor as a bump. Some head injuries can be more severe. More severe head injuries include:  A jarring injury to the brain (concussion).  A bruise of the brain (contusion). This mean there is bleeding in the brain  that can cause swelling.  A cracked skull (skull fracture).  Bleeding in the brain that collects, clots, and forms a bump (hematoma). WHAT CAUSES A HEAD INJURY? A serious head injury is most likely to happen to someone who is in a car wreck and is not wearing a seat belt. Other causes of major head injuries include bicycle or motorcycle accidents, sports injuries, and falls. HOW ARE HEAD INJURIES DIAGNOSED? A complete history of the event leading to the injury and your current symptoms will be helpful in diagnosing head injuries. Many times, pictures of the brain, such as CT or MRI are needed to see the extent of the injury. Often, an overnight hospital stay is necessary for observation.  WHEN SHOULD I SEEK IMMEDIATE MEDICAL CARE?  You should get help right away if:  You have confusion or drowsiness.  You feel sick to your stomach (nauseous) or have continued, forceful vomiting.  You have dizziness or unsteadiness that is getting worse.  You have severe, continued headaches not relieved by medicine. Only take over-the-counter or prescription medicines for pain, fever, or discomfort as directed by your health care provider.  You do not have normal function of the arms or legs or are unable to walk.  You notice changes in the black spots in the center of the colored part of your eye (pupil).  You have a clear or bloody fluid coming from your nose or ears.  You have a loss of vision. During the next 24 hours after the injury, you must stay with someone who can watch you for the warning signs. This person should contact local emergency services (911 in the [place] if you have seizures, you become unconscious, or you are unable to wake up. HOW CAN I PREVENT A HEAD INJURY IN THE FUTURE? The most important factor for preventing major head injuries is avoiding motor vehicle accidents. To minimize the potential for damage to your head, it is crucial to wear seat belts while riding in motor  vehicles. Wearing helmets while bike riding and playing collision sports (like football) is also helpful. Also, avoiding dangerous activities around the house will further help reduce your risk of head injury.  WHEN CAN I RETURN TO NORMAL ACTIVITIES AND ATHLETICS? You should be reevaluated by your health care provider before returning to these activities. If you have any of the following symptoms, you should not return to activities or contact sports until 1 week after the symptoms have stopped:  Persistent headache.  Dizziness or vertigo.  Poor attention and concentration.  Confusion.  Memory problems.  Nausea or vomiting.  Fatigue or tire easily.  Irritability.  Intolerant of bright lights or loud noises.  Anxiety or depression.  Disturbed sleep. MAKE SURE YOU:   Understand these instructions.  Will watch your condition.  Will get help right away if you are not doing well or get worse. Document Released: 05/01/2005 Document Revised: 02/19/2013  Document Reviewed: 01/06/2013 [facility] Patient Information 2014 [place].

## 2013-06-30 NOTE — ED Notes (Signed)
Pt reports that she fell at the doctors office this morning.  Reports increased (L) knee pain since that time.  Denies LOC

## 2013-07-01 ENCOUNTER — Emergency Department (HOSPITAL_COMMUNITY): Payer: Medicare Other

## 2013-07-01 ENCOUNTER — Encounter (HOSPITAL_COMMUNITY): Payer: Self-pay | Admitting: Emergency Medicine

## 2013-07-01 ENCOUNTER — Emergency Department (HOSPITAL_COMMUNITY)
Admission: EM | Admit: 2013-07-01 | Discharge: 2013-07-01 | Disposition: A | Discharge status: Home / Self Care | Payer: Medicare Other | Attending: Emergency Medicine | Admitting: Emergency Medicine

## 2013-07-01 DIAGNOSIS — S79929A Unspecified injury of unspecified thigh, initial encounter: Secondary | ICD-10-CM

## 2013-07-01 DIAGNOSIS — M79606 Pain in leg, unspecified: Secondary | ICD-10-CM

## 2013-07-01 DIAGNOSIS — Z79899 Other long term (current) drug therapy: Secondary | ICD-10-CM | POA: Insufficient documentation

## 2013-07-01 DIAGNOSIS — S99919A Unspecified injury of unspecified ankle, initial encounter: Principal | ICD-10-CM

## 2013-07-01 DIAGNOSIS — Y929 Unspecified place or not applicable: Secondary | ICD-10-CM | POA: Insufficient documentation

## 2013-07-01 DIAGNOSIS — E119 Type 2 diabetes mellitus without complications: Secondary | ICD-10-CM | POA: Insufficient documentation

## 2013-07-01 DIAGNOSIS — W050XXA Fall from non-moving wheelchair, initial encounter: Secondary | ICD-10-CM | POA: Insufficient documentation

## 2013-07-01 DIAGNOSIS — S99929A Unspecified injury of unspecified foot, initial encounter: Principal | ICD-10-CM

## 2013-07-01 DIAGNOSIS — Y939 Activity, unspecified: Secondary | ICD-10-CM | POA: Insufficient documentation

## 2013-07-01 DIAGNOSIS — S8990XA Unspecified injury of unspecified lower leg, initial encounter: Secondary | ICD-10-CM | POA: Insufficient documentation

## 2013-07-01 DIAGNOSIS — G8929 Other chronic pain: Secondary | ICD-10-CM | POA: Insufficient documentation

## 2013-07-01 DIAGNOSIS — Z7982 Long term (current) use of aspirin: Secondary | ICD-10-CM | POA: Insufficient documentation

## 2013-07-01 DIAGNOSIS — IMO0002 Reserved for concepts with insufficient information to code with codable children: Secondary | ICD-10-CM | POA: Insufficient documentation

## 2013-07-01 DIAGNOSIS — I10 Essential (primary) hypertension: Secondary | ICD-10-CM | POA: Insufficient documentation

## 2013-07-01 DIAGNOSIS — S79919A Unspecified injury of unspecified hip, initial encounter: Secondary | ICD-10-CM | POA: Insufficient documentation

## 2013-07-01 DIAGNOSIS — S8000XA Contusion of unspecified knee, initial encounter: Secondary | ICD-10-CM | POA: Insufficient documentation

## 2013-07-01 DIAGNOSIS — Z8619 Personal history of other infectious and parasitic diseases: Secondary | ICD-10-CM | POA: Insufficient documentation

## 2013-07-01 MED ORDER — HYDROMORPHONE HCL PF 1 MG/ML IJ SOLN
2.0000 mg | Freq: Once | INTRAMUSCULAR | Status: AC
  Administered 2013-07-01: 2 mg via INTRAMUSCULAR
  Filled 2013-07-01: qty 2

## 2013-07-01 MED ORDER — HYDROMORPHONE HCL PF 1 MG/ML IJ SOLN
1.0000 mg | Freq: Once | INTRAMUSCULAR | Status: AC
  Administered 2013-07-01: 1 mg via INTRAMUSCULAR
  Filled 2013-07-01: qty 1

## 2013-07-01 NOTE — ED Notes (Addendum)
Pt. Having lt. Leg pain and lt. Knee pain.  Yesterday after she fell from her wheelchair in the [place] office.  She does not walk due to back pain .  She went to [facility] yesterday and they gave her pain medication for home. Pt. Reports it is not helping.  Lt. Knee is swollen and bruised decreased ROM , warm to touch

## 2013-07-01 NOTE — ED Provider Notes (Signed)
CSN: [id number]     Arrival date & time 07/01/13  1235 History   First MD Initiated Contact with Patient 07/01/13 1243     Chief Complaint  Patient presents with  . Leg Pain     (Consider location/radiation/quality/duration/timing/severity/associated sxs/prior Treatment) HPI Comments: Patient presents to the ED with a chief complaint of left knee, left hip, and low-back pain.  Patient states that she suffered a mechanical fall yesterday. She was seen at [facility] and had x-rays of her knee.  She was discharged with a knee immobilizer and pain meds.  She states that the percocet is not working.  She states that she called her orthopedic doctor and was told to come to the emergency department.  She states that she is unable to ambulate.    The history is provided by the patient. No language interpreter was used.    Past Medical History  Diagnosis Date  . Diabetes mellitus   . Post-polio syndrome   . Chronic back pain   . Broken heart syndrome   . Hypertension    Past Surgical History  Procedure Laterality Date  . Cholecystectomy    . Abdominal hysterectomy    . Knee surgery    . Hand surgery    . Tubal ligation     Family History  Problem Relation Age of Onset  . Heart failure Mother   . Diabetes Father   . Cancer Father   . Heart failure Father    History  Substance Use Topics  . Smoking status: Never Smoker   . Smokeless tobacco: Not on file  . Alcohol Use: No   OB History   Grav Para Term Preterm Abortions TAB SAB Ect Mult Living                 Review of Systems  Unable to perform ROS Musculoskeletal: Positive for arthralgias, back pain, joint swelling and myalgias.      Allergies  Ciprofloxacin; Acetaminophen; Aspirin; Nsaids; Propoxyphene n-acetaminophen; Soma compound; Sulfa antibiotics; and Tolectin  Home Medications   Current Outpatient Rx  Name  Route  Sig  Dispense  Refill  . aspirin 81 MG chewable tablet   Oral   Chew 324 mg by mouth once.          Marland Kitchen aspirin 81 MG tablet   Oral   Take 81 mg by mouth at bedtime.          . Calcium Carbonate-Vitamin D (CALTRATE 600+D) 600-400 MG-UNIT per chew tablet   Oral   Chew 1 tablet by mouth 2 (two) times daily.          . citalopram (CELEXA) 40 MG tablet   Oral   Take 40 mg by mouth daily.           . clonazePAM (KLONOPIN) 0.5 MG tablet   Oral   Take 0.5 mg by mouth 2 (two) times daily as needed.           . Cyanocobalamin (VITAMIN B 12 PO)   Oral   Take 500 mcg by mouth daily.         Marland Kitchen dexlansoprazole (DEXILANT) 60 MG capsule   Oral   Take 60 mg by mouth at bedtime.          . ergocalciferol (VITAMIN D2) 50000 UNITS capsule   Oral   Take 50,000 Units by mouth every 30 (thirty) days.         . fish oil-omega-3 fatty acids 1000 MG  capsule   Oral   Take 2 g by mouth 2 (two) times daily.           . furosemide (LASIX) 20 MG tablet      TAKE 1 TABLET IN THE MORNING.   90 tablet   3   . hydrOXYzine (ATARAX/VISTARIL) 25 MG tablet   Oral   Take 25 mg by mouth 3 (three) times daily as needed for itching. For itching         . levothyroxine (SYNTHROID, LEVOTHROID) 88 MCG tablet   Oral   Take 88 mcg by mouth daily.           . metFORMIN (GLUCOPHAGE) 500 MG tablet   Oral   Take 1,000 mg by mouth 2 (two) times daily with a meal.           . metoprolol succinate (TOPROL-XL) 25 MG 24 hr tablet      TAKE 1/2 TABLET DAILY.   45 tablet   6   . Multiple Vitamins-Minerals (MULTIVITAMIN WITH MINERALS) tablet   Oral   Take 1 tablet by mouth daily.           . nitroGLYCERIN (NITROSTAT) 0.4 MG SL tablet   Sublingual   Place 0.4 mg under the tongue every 5 (five) minutes as needed for chest pain.         Marland Kitchen oxyCODONE (OXY IR/ROXICODONE) 5 MG immediate release tablet   Oral   Take 1 tablet (5 mg total) by mouth every 4 (four) hours as needed for severe pain.   30 tablet   0   . simvastatin (ZOCOR) 40 MG tablet   Oral   Take 20 mg by mouth at  bedtime.          Marland Kitchen tiZANidine (ZANAFLEX) 4 MG tablet   Oral   Take 4 mg by mouth every 6 (six) hours as needed. PAIN          . traMADol (ULTRAM) 50 MG tablet   Oral   Take 50 mg by mouth every 6 (six) hours as needed for pain. For pain         . triamterene-hydrochlorothiazide (MAXZIDE-25) 37.5-25 MG per tablet   Oral   Take 0.5 tablets by mouth daily.           Marland Kitchen zolpidem (AMBIEN) 10 MG tablet   Oral   Take 10 mg by mouth at bedtime.           There were no vitals taken for this visit. Physical Exam  Nursing note and vitals reviewed. Constitutional: She is oriented to person, place, and time. She appears well-developed and well-nourished.  HENT:  Head: Normocephalic and atraumatic.  Eyes: Conjunctivae and EOM are normal.  Neck: Normal range of motion.  Cardiovascular: Normal rate.   Pulmonary/Chest: Effort normal.  Abdominal: She exhibits no distension.  Musculoskeletal: Normal range of motion.  Bilateral hips tender to palpation, L-spine tender to palpation, left knee is swollen and tender palpation, ROM and strength deferred 2/2 pain  Neurological: She is alert and oriented to person, place, and time.  Skin: Skin is dry.  Ecchymosis of left knee  Psychiatric: She has a normal mood and affect. Her behavior is normal. Judgment and thought content normal.    ED Course  Procedures (including critical care time) Labs Review Labs Reviewed - No data to display Imaging Review Ct Head Wo Contrast  06/30/2013   CLINICAL DATA:  Headache, dizziness  EXAM: CT HEAD WITHOUT  CONTRAST  TECHNIQUE: Contiguous axial images were obtained from the base of the skull through the vertex without intravenous contrast.  COMPARISON:  06/04/2010  FINDINGS: No skull fracture is noted. Paranasal sinuses and mastoid air cells are unremarkable. No intracranial hemorrhage, mass effect or midline shift. Mild cerebral atrophy. Mild periventricular white matter decreased attenuation probable due  to chronic small vessel ischemic changes. No acute cortical infarction. No mass lesion is noted on this unenhanced scan.  IMPRESSION: No acute intracranial abnormality.  Mild cerebral atrophy.   Electronically Signed   By: Lahoma Crocker M.D.   On: 06/30/2013 15:30   Dg Knee Complete 4 Views Left  06/30/2013   CLINICAL DATA:  Recent traumatic injury with knee pain  EXAM: LEFT KNEE - COMPLETE 4+ VIEW  COMPARISON:  None.  FINDINGS: Severe degenerative changes of the knee are noted particularly in the medial joint space. Significant remodeling is noted. Considerable soft tissue swelling is noted related to the recent injury. There is also fragmentation of the patella which appears to be chronic in nature given the fairly well corticated margins.  IMPRESSION: Soft tissue injury as well as significant chronic bony abnormality. No definitive acute fracture is seen.   Electronically Signed   By: Inez Catalina M.D.   On: 06/30/2013 15:36    EKG Interpretation   None       MDM   Final diagnoses:  Leg pain    Is with leg pain following a fall yesterday. The fall was mechanical. She was evaluated yesterday at [facility] [place]. Plain films were obtained and were negative. Will refer the patient back to orthopedics. Patient has been seen by and discussed with Dr. Alvino Chapel, who agrees with the plan. Patient is stable and ready for discharge. Patient has pain medicine at home.    Montine Circle, PA-C 07/01/13 1523

## 2013-07-01 NOTE — ED Notes (Signed)
Patient transported to X-ray 

## 2013-07-01 NOTE — ED Provider Notes (Signed)
Medical screening examination/treatment/procedure(s) were performed by non-physician practitioner and as supervising physician I was immediately available for consultation/collaboration.  EKG Interpretation   None        Lawayne Hartig R. Andrina Locken, MD 07/01/13 1640 

## 2013-07-01 NOTE — Discharge Instructions (Signed)
Cryotherapy Cryotherapy means treatment with cold. Ice or gel packs can be used to reduce both pain and swelling. Ice is the most helpful within the first 24 to 48 hours after an injury or flareup from overusing a muscle or joint. Sprains, strains, spasms, burning pain, shooting pain, and aches can all be eased with ice. Ice can also be used when recovering from surgery. Ice is effective, has very few side effects, and is safe for most people to use. PRECAUTIONS  Ice is not a safe treatment option for people with:  Raynaud's phenomenon. This is a condition affecting small blood vessels in the extremities. Exposure to cold may cause your problems to return.  Cold hypersensitivity. There are many forms of cold hypersensitivity, including:  Cold urticaria. Red, itchy hives appear on the skin when the tissues begin to warm after being iced.  Cold erythema. This is a red, itchy rash caused by exposure to cold.  Cold hemoglobinuria. Red blood cells break down when the tissues begin to warm after being iced. The hemoglobin that carry oxygen are passed into the urine because they cannot combine with blood proteins fast enough.  Numbness or altered sensitivity in the area being iced. If you have any of the following conditions, do not use ice until you have discussed cryotherapy with your caregiver:  Heart conditions, such as arrhythmia, angina, or chronic heart disease.  High blood pressure.  Healing wounds or open skin in the area being iced.  Current infections.  Rheumatoid arthritis.  Poor circulation.  Diabetes. Ice slows the blood flow in the region it is applied. This is beneficial when trying to stop inflamed tissues from spreading irritating chemicals to surrounding tissues. However, if you expose your skin to cold temperatures for too long or without the proper protection, you can damage your skin or nerves. Watch for signs of skin damage due to cold. HOME CARE INSTRUCTIONS Follow  these tips to use ice and cold packs safely.  Place a dry or damp towel between the ice and skin. A damp towel will cool the skin more quickly, so you may need to shorten the time that the ice is used.  For a more rapid response, add gentle compression to the ice.  Ice for no more than 10 to 20 minutes at a time. The bonier the area you are icing, the less time it will take to get the benefits of ice.  Check your skin after 5 minutes to make sure there are no signs of a poor response to cold or skin damage.  Rest 20 minutes or more in between uses.  Once your skin is numb, you can end your treatment. You can test numbness by very lightly touching your skin. The touch should be so light that you do not see the skin dimple from the pressure of your fingertip. When using ice, most people will feel these normal sensations in this order: cold, burning, aching, and numbness.  Do not use ice on someone who cannot communicate their responses to pain, such as small children or people with dementia. HOW TO MAKE AN ICE PACK Ice packs are the most common way to use ice therapy. Other methods include ice massage, ice baths, and cryo-sprays. Muscle creams that cause a cold, tingly feeling do not offer the same benefits that ice offers and should not be used as a substitute unless recommended by your caregiver. To make an ice pack, do one of the following:  Place crushed ice or  a bag of frozen vegetables in a sealable plastic bag. Squeeze out the excess air. Place this bag inside another plastic bag. Slide the bag into a pillowcase or place a damp towel between your skin and the bag.  Mix 3 parts water with 1 part rubbing alcohol. Freeze the mixture in a sealable plastic bag. When you remove the mixture from the freezer, it will be slushy. Squeeze out the excess air. Place this bag inside another plastic bag. Slide the bag into a pillowcase or place a damp towel between your skin and the bag. SEEK MEDICAL  CARE IF:  You develop white spots on your skin. This may give the skin a blotchy (mottled) appearance.  Your skin turns blue or pale.  Your skin becomes waxy or hard.  Your swelling gets worse. MAKE SURE YOU:   Understand these instructions.  Will watch your condition.  Will get help right away if you are not doing well or get worse. Document Released: 12/26/2010 Document Revised: 07/24/2011 Document Reviewed: 12/26/2010 [facility] Patient Information 2014 [place], [place]. Arthralgia Your caregiver has diagnosed you as suffering from an arthralgia. Arthralgia means there is pain in a joint. This can come from many reasons including:  Bruising the joint which causes soreness (inflammation) in the joint.  Wear and tear on the joints which occur as we grow older (osteoarthritis).  Overusing the joint.  Various forms of arthritis.  Infections of the joint. Regardless of the cause of pain in your joint, most of these different pains respond to anti-inflammatory drugs and rest. The exception to this is when a joint is infected, and these cases are treated with antibiotics, if it is a bacterial infection. HOME CARE INSTRUCTIONS   Rest the injured area for as long as directed by your caregiver. Then slowly start using the joint as directed by your caregiver and as the pain allows. Crutches as directed may be useful if the ankles, knees or hips are involved. If the knee was splinted or casted, continue use and care as directed. If an stretchy or elastic wrapping bandage has been applied today, it should be removed and re-applied every 3 to 4 hours. It should not be applied tightly, but firmly enough to keep swelling down. Watch toes and feet for swelling, bluish discoloration, coldness, numbness or excessive pain. If any of these problems (symptoms) occur, remove the ace bandage and re-apply more loosely. If these symptoms persist, contact your caregiver or return to this location.  For the  first 24 hours, keep the injured extremity elevated on pillows while lying down.  Apply ice for 15-20 minutes to the sore joint every couple hours while awake for the first half day. Then 03-04 times per day for the first 48 hours. Put the ice in a plastic bag and place a towel between the bag of ice and your skin.  Wear any splinting, casting, elastic bandage applications, or slings as instructed.  Only take over-the-counter or prescription medicines for pain, discomfort, or fever as directed by your caregiver. Do not use aspirin immediately after the injury unless instructed by your physician. Aspirin can cause increased bleeding and bruising of the tissues.  If you were given crutches, continue to use them as instructed and do not resume weight bearing on the sore joint until instructed. Persistent pain and inability to use the sore joint as directed for more than 2 to 3 days are warning signs indicating that you should see a caregiver for a follow-up visit as  soon as possible. Initially, a hairline fracture (break in bone) may not be evident on X-rays. Persistent pain and swelling indicate that further evaluation, non-weight bearing or use of the joint (use of crutches or slings as instructed), or further X-rays are indicated. X-rays may sometimes not show a small fracture until a week or 10 days later. Make a follow-up appointment with your own caregiver or one to whom we have referred you. A radiologist (specialist in reading X-rays) may read your X-rays. Make sure you know how you are to obtain your X-ray results. Do not assume everything is normal if you do not hear from [place]. SEEK MEDICAL CARE IF: Bruising, swelling, or pain increases. SEEK IMMEDIATE MEDICAL CARE IF:   Your fingers or toes are numb or blue.  The pain is not responding to medications and continues to stay the same or get worse.  The pain in your joint becomes severe.  You develop a fever over 102 F (38.9 C).  It becomes  impossible to move or use the joint. MAKE SURE YOU:   Understand these instructions.  Will watch your condition.  Will get help right away if you are not doing well or get worse. Document Released: 05/01/2005 Document Revised: 07/24/2011 Document Reviewed: 12/18/2007 [facility] Patient Information 2014 [place].

## 2013-07-09 ENCOUNTER — Ambulatory Visit
Admission: RE | Admit: 2013-07-09 | Discharge: 2013-07-09 | Disposition: A | Discharge status: Home / Self Care | Payer: Medicare Other | Source: Ambulatory Visit | Attending: Orthopedic Surgery | Admitting: Orthopedic Surgery

## 2013-07-09 ENCOUNTER — Other Ambulatory Visit: Payer: Self-pay | Admitting: Orthopedic Surgery

## 2013-07-09 DIAGNOSIS — M25562 Pain in left knee: Secondary | ICD-10-CM

## 2013-07-14 ENCOUNTER — Encounter (HOSPITAL_COMMUNITY): Payer: Self-pay | Admitting: Pharmacy Technician

## 2013-07-15 NOTE — H&P (Signed)
[name] is an 68 y.o. female.    Chief Complaint: left knee pain  HPI: 67 y/o female that uses a scooter and wheelchair normally had a recent fall injuring left knee. X-rays and CT scan demonstrate a displaced left patella fracture with end stage osteoarthritis in the knee. Pt c/o moderate to severe pain with rom of left lower extremity. Left lower extremity is her dominant side with transfers and ambulation. Risks and benefits of surgery discussed and pt would like to proceed with surgery so that she begin weight bearing on the left lower extremity without flexion  PCP:  Kathy Pel, MD  PMH: Past Medical History  Diagnosis Date  . Diabetes mellitus   . Post-polio syndrome   . Chronic back pain   . Broken heart syndrome   . Hypertension     PSH: Past Surgical History  Procedure Laterality Date  . Cholecystectomy    . Abdominal hysterectomy    . Knee surgery    . Hand surgery    . Tubal ligation      Social History:  reports that she has never smoked. She does not have any smokeless tobacco history on file. She reports that she does not drink alcohol or use illicit drugs.  Allergies:  Allergies  Allergen Reactions  . Ciprofloxacin Rash  . Acetaminophen Itching  . Aspirin Other (See Comments)    Chest pain  . Nsaids Other (See Comments)    Chest pain and stomach pain  . Penicillins Other (See Comments)    unknown  . Percocet [Oxycodone-Acetaminophen] Itching  . Propoxyphene N-Acetaminophen Other (See Comments)    Chest/stomach pain  . Soma Compound [Carisoprodol-Aspirin] Other (See Comments)    "cant judge distance"  . Sulfa Antibiotics Other (See Comments)    unknown  . Tolectin [Tolmetin] Itching  . Macrodantin [Nitrofurantoin Macrocrystal] Rash    Medications: No current facility-administered medications for this encounter.   Current Outpatient Prescriptions  Medication Sig Dispense Refill  . albuterol (PROVENTIL HFA;VENTOLIN HFA) 108 (90  BASE) MCG/ACT inhaler Inhale 1-2 puffs into the lungs every 6 (six) hours as needed for wheezing or shortness of breath.      . benzonatate (TESSALON) 100 MG capsule Take 100 mg by mouth 3 (three) times daily as needed for cough.      . Calcium Carbonate-Vitamin D (CALTRATE 600+D) 600-400 MG-UNIT per chew tablet Chew 1 tablet by mouth 2 (two) times daily.       . chlorthalidone (HYGROTON) 25 MG tablet Take 12.5 mg by mouth daily.       . citalopram (CELEXA) 40 MG tablet Take 40 mg by mouth daily.        . clonazePAM (KLONOPIN) 0.5 MG tablet Take 0.5 mg by mouth 2 (two) times daily.       . Cyanocobalamin (VITAMIN B 12 PO) Take 500 mcg by mouth daily.      Marland Kitchen dexlansoprazole (DEXILANT) 60 MG capsule Take 60 mg by mouth at bedtime.       . ergocalciferol (VITAMIN D2) 50000 UNITS capsule Take 50,000 Units by mouth every 30 (thirty) days.      . fish oil-omega-3 fatty acids 1000 MG capsule Take 2 g by mouth 2 (two) times daily.        . furosemide (LASIX) 20 MG tablet Take 20 mg by mouth daily.      . hydrOXYzine (ATARAX/VISTARIL) 25 MG tablet Take 25 mg by mouth 3 (three) times daily as needed for itching.      Marland Kitchen  levothyroxine (SYNTHROID, LEVOTHROID) 100 MCG tablet Take 100 mcg by mouth daily before breakfast.      . losartan (COZAAR) 100 MG tablet Take 100 mg by mouth daily.       . metFORMIN (GLUCOPHAGE) 500 MG tablet Take 1,000 mg by mouth 2 (two) times daily with a meal.        . metoprolol succinate (TOPROL-XL) 25 MG 24 hr tablet Take 12.5 mg by mouth daily.      . Multiple Vitamins-Minerals (MULTIVITAMIN WITH MINERALS) tablet Take 1 tablet by mouth daily.        . nitroGLYCERIN (NITROSTAT) 0.4 MG SL tablet Place 0.4 mg under the tongue every 5 (five) minutes as needed for chest pain.      Marland Kitchen oxyCODONE (OXY IR/ROXICODONE) 5 MG immediate release tablet Take 1 tablet (5 mg total) by mouth every 4 (four) hours as needed for severe pain.  30 tablet  0  . potassium chloride SA (K-DUR,KLOR-CON) 20 MEQ  tablet Take 20 mEq by mouth 3 (three) times daily.       . simvastatin (ZOCOR) 40 MG tablet Take 20 mg by mouth at bedtime.       Marland Kitchen tiZANidine (ZANAFLEX) 4 MG tablet Take 4 mg by mouth 5 (five) times daily as needed for muscle spasms. PAIN      . TOVIAZ 4 MG TB24 tablet Take 4 mg by mouth daily.       . traMADol (ULTRAM) 50 MG tablet Take 50 mg by mouth every 6 (six) hours as needed for pain. For pain      . zolpidem (AMBIEN) 10 MG tablet Take 10 mg by mouth at bedtime.       Marland Kitchen aspirin 81 MG tablet Take 81 mg by mouth at bedtime.         No results found for this or any previous visit (from the past 48 hour(s)). No results found.  ROS: ROS Normally uses a scooter and wheelchair for mobility C/o moderate pain with any rom or use of left lower extremity  Physical Exam: Alert and appropriate 68 y/o female in no acute distress Bilateral upper extremities show full rom with no deformity or tenderness Right lower extremity with no deformity but limited strength that remains at her baseline Left lower extremity with mild edema to lower leg and mild ecchymosis to anterior knee Pain with any rom of left lower leg Pt non weight bearing Active breath sounds bilaterally Physical Exam   Assessment/Plan Assessment: left displaced patella fracture  Plan: Admit for surgical management of left patella fracture  PT/OT after surgery and determine length of stay with her progress

## 2013-07-17 ENCOUNTER — Encounter (HOSPITAL_COMMUNITY)
Admission: RE | Admit: 2013-07-17 | Discharge: 2013-07-17 | Disposition: A | Discharge status: Home / Self Care | Payer: Medicare Other | Source: Ambulatory Visit | Attending: Orthopedic Surgery | Admitting: Orthopedic Surgery

## 2013-07-17 ENCOUNTER — Encounter (HOSPITAL_COMMUNITY): Payer: Self-pay

## 2013-07-17 DIAGNOSIS — Z01812 Encounter for preprocedural laboratory examination: Secondary | ICD-10-CM

## 2013-07-17 HISTORY — DX: Heart failure, unspecified: I50.9

## 2013-07-17 HISTORY — DX: Hyperlipidemia, unspecified: E78.5

## 2013-07-17 HISTORY — DX: Anxiety disorder, unspecified: F41.9

## 2013-07-17 HISTORY — DX: Family history of other specified conditions: Z84.89

## 2013-07-17 HISTORY — DX: Hypothyroidism, unspecified: E03.9

## 2013-07-17 HISTORY — DX: Other specified disorders of bone density and structure, unspecified site: M85.80

## 2013-07-17 HISTORY — DX: Unspecified osteoarthritis, unspecified site: M19.90

## 2013-07-17 HISTORY — DX: Mental disorder, not otherwise specified: F99

## 2013-07-17 HISTORY — DX: Gastro-esophageal reflux disease without esophagitis: K21.9

## 2013-07-17 HISTORY — DX: Unspecified asthma, uncomplicated: J45.909

## 2013-07-17 HISTORY — DX: Headache: R51

## 2013-07-17 HISTORY — DX: Insomnia, unspecified: G47.00

## 2013-07-17 LAB — CBC
HEMATOCRIT: 36.4 % (ref 36.0–46.0)
HEMOGLOBIN: 12.5 g/dL (ref 12.0–15.0)
MCH: 30.5 pg (ref 26.0–34.0)
MCHC: 34.3 g/dL (ref 30.0–36.0)
MCV: 88.8 fL (ref 78.0–100.0)
Platelets: 440 10*3/uL — ABNORMAL HIGH (ref 150–400)
RBC: 4.1 MIL/uL (ref 3.87–5.11)
RDW: 14 % (ref 11.5–15.5)
WBC: 7.7 10*3/uL (ref 4.0–10.5)

## 2013-07-17 LAB — BASIC METABOLIC PANEL
BUN: 7 mg/dL (ref 6–23)
CHLORIDE: 96 meq/L (ref 96–112)
CO2: 26 meq/L (ref 19–32)
CREATININE: 0.45 mg/dL — AB (ref 0.50–1.10)
Calcium: 9.5 mg/dL (ref 8.4–10.5)
GFR calc non Af Amer: >90 mL/min (ref >90)
GLUCOSE: 103 mg/dL — AB (ref 70–99)
Potassium: 4.4 mEq/L (ref 3.7–5.3)
Sodium: 134 mEq/L — ABNORMAL LOW (ref 137–147)

## 2013-07-17 MED ORDER — CLINDAMYCIN PHOSPHATE 900 MG/50ML IV SOLN
900.0000 mg | INTRAVENOUS | Status: AC
  Administered 2013-07-18: 900 mg via INTRAVENOUS
  Filled 2013-07-17: qty 50

## 2013-07-17 NOTE — Progress Notes (Signed)
Patient said she will not be able to shower or have anyone to help her wash up.

## 2013-07-17 NOTE — Pre-Procedure Instructions (Signed)
[name]  07/17/2013   Your procedure is scheduled on:  Friday, March 6.  Report to [facility], Main Entrance [name]" at 8:30AM.  Call this number if you have problems the morning of surgery: [phone]   Remember:   Do not eat food or drink liquids after midnight.   Take these medicines the morning of surgery with A SIP OF WATER: citalopram (CELEXA), levothyroxine (SYNTHROID, LEVOTHROID), metoprolol succinate (TOPROL-XL).  May use Albuterol inhaler and bring it to the hospital with you.  Take if needed:nitroGLYCERIN (NITROSTAT), oxyCODONE (OXY IR/ROXICODONE),  tiZANidine (ZANAFLEX), traMADol (ULTRAM), benzonatate (TESSALON) .   Do not wear jewelry, make-up or nail polish.  Do not wear lotions, powders, or perfumes.   Do not shave 48 hours prior to surgery.   Do not bring valuables to the hospital.  [facility] is not responsible for any belongings or valuables.               Contacts, dentures or bridgework may not be worn into surgery.  Leave suitcase in the car. After surgery it may be brought to your room.  For patients admitted to the hospital, discharge time is determined by your treatment team.               Patients discharged the day of surgery will not be allowed to drive home.  Name and phone number of your driver: -    Special Instructions: Review  [name] - Preparing For Surgery.   Please read over the following fact sheets that you were given: Pain Booklet, Coughing and Deep Breathing and Surgical Site Infection Prevention

## 2013-07-18 ENCOUNTER — Encounter (HOSPITAL_COMMUNITY): Payer: Self-pay | Admitting: *Deleted

## 2013-07-18 ENCOUNTER — Inpatient Hospital Stay (HOSPITAL_COMMUNITY): Payer: Medicare Other

## 2013-07-18 ENCOUNTER — Inpatient Hospital Stay (HOSPITAL_COMMUNITY): Payer: Medicare Other | Admitting: Anesthesiology

## 2013-07-18 ENCOUNTER — Encounter (HOSPITAL_COMMUNITY): Payer: Medicare Other | Admitting: Anesthesiology

## 2013-07-18 ENCOUNTER — Encounter (HOSPITAL_COMMUNITY)
Admission: RE | Disposition: A | Discharge status: Home Health | Payer: Self-pay | Source: Ambulatory Visit | Attending: Orthopedic Surgery

## 2013-07-18 ENCOUNTER — Inpatient Hospital Stay (HOSPITAL_COMMUNITY)
Admission: RE | Admit: 2013-07-18 | Discharge: 2013-07-22 | DRG: 493 | Disposition: A | Discharge status: Home Health | Payer: Medicare Other | Source: Ambulatory Visit | Attending: Orthopedic Surgery | Admitting: Orthopedic Surgery

## 2013-07-18 DIAGNOSIS — I10 Essential (primary) hypertension: Secondary | ICD-10-CM | POA: Diagnosis present

## 2013-07-18 DIAGNOSIS — Z01812 Encounter for preprocedural laboratory examination: Secondary | ICD-10-CM

## 2013-07-18 DIAGNOSIS — Z9851 Tubal ligation status: Secondary | ICD-10-CM

## 2013-07-18 DIAGNOSIS — S82002A Unspecified fracture of left patella, initial encounter for closed fracture: Secondary | ICD-10-CM | POA: Diagnosis present

## 2013-07-18 DIAGNOSIS — Z886 Allergy status to analgesic agent status: Secondary | ICD-10-CM

## 2013-07-18 DIAGNOSIS — M419 Scoliosis, unspecified: Secondary | ICD-10-CM | POA: Diagnosis present

## 2013-07-18 DIAGNOSIS — Z880 Allergy status to penicillin: Secondary | ICD-10-CM

## 2013-07-18 DIAGNOSIS — I5181 Takotsubo syndrome: Secondary | ICD-10-CM | POA: Diagnosis present

## 2013-07-18 DIAGNOSIS — Z9089 Acquired absence of other organs: Secondary | ICD-10-CM

## 2013-07-18 DIAGNOSIS — K219 Gastro-esophageal reflux disease without esophagitis: Secondary | ICD-10-CM | POA: Diagnosis present

## 2013-07-18 DIAGNOSIS — Z7982 Long term (current) use of aspirin: Secondary | ICD-10-CM

## 2013-07-18 DIAGNOSIS — B91 Sequelae of poliomyelitis: Secondary | ICD-10-CM

## 2013-07-18 DIAGNOSIS — S82009A Unspecified fracture of unspecified patella, initial encounter for closed fracture: Principal | ICD-10-CM | POA: Diagnosis present

## 2013-07-18 DIAGNOSIS — E119 Type 2 diabetes mellitus without complications: Secondary | ICD-10-CM | POA: Diagnosis present

## 2013-07-18 DIAGNOSIS — G8918 Other acute postprocedural pain: Secondary | ICD-10-CM | POA: Diagnosis not present

## 2013-07-18 DIAGNOSIS — M1710 Unilateral primary osteoarthritis, unspecified knee: Secondary | ICD-10-CM | POA: Diagnosis present

## 2013-07-18 DIAGNOSIS — M549 Dorsalgia, unspecified: Secondary | ICD-10-CM | POA: Diagnosis present

## 2013-07-18 DIAGNOSIS — G14 Postpolio syndrome: Secondary | ICD-10-CM

## 2013-07-18 DIAGNOSIS — Z881 Allergy status to other antibiotic agents status: Secondary | ICD-10-CM

## 2013-07-18 DIAGNOSIS — Z79899 Other long term (current) drug therapy: Secondary | ICD-10-CM

## 2013-07-18 DIAGNOSIS — W19XXXA Unspecified fall, initial encounter: Secondary | ICD-10-CM | POA: Diagnosis present

## 2013-07-18 DIAGNOSIS — G8929 Other chronic pain: Secondary | ICD-10-CM | POA: Diagnosis present

## 2013-07-18 DIAGNOSIS — Z882 Allergy status to sulfonamides status: Secondary | ICD-10-CM

## 2013-07-18 DIAGNOSIS — Z888 Allergy status to other drugs, medicaments and biological substances status: Secondary | ICD-10-CM

## 2013-07-18 HISTORY — DX: Other chronic pain: G89.29

## 2013-07-18 HISTORY — PX: ORIF PATELLA: SHX5033

## 2013-07-18 HISTORY — DX: Calculus of kidney: N20.0

## 2013-07-18 HISTORY — DX: Sleep apnea, unspecified: G47.30

## 2013-07-18 HISTORY — DX: Low back pain: M54.5

## 2013-07-18 HISTORY — DX: Personal history of other diseases of the digestive system: Z87.19

## 2013-07-18 HISTORY — DX: Low back pain, unspecified: M54.50

## 2013-07-18 HISTORY — DX: Migraine, unspecified, not intractable, without status migrainosus: G43.909

## 2013-07-18 HISTORY — DX: Type 2 diabetes mellitus without complications: E11.9

## 2013-07-18 HISTORY — PX: ORIF PATELLA FRACTURE: SUR947

## 2013-07-18 LAB — CBC
HEMATOCRIT: 34.3 % — AB (ref 36.0–46.0)
Hemoglobin: 11.5 g/dL — ABNORMAL LOW (ref 12.0–15.0)
MCH: 30.2 pg (ref 26.0–34.0)
MCHC: 33.5 g/dL (ref 30.0–36.0)
MCV: 90 fL (ref 78.0–100.0)
Platelets: 391 10*3/uL (ref 150–400)
RBC: 3.81 MIL/uL — ABNORMAL LOW (ref 3.87–5.11)
RDW: 14.3 % (ref 11.5–15.5)
WBC: 8.9 10*3/uL (ref 4.0–10.5)

## 2013-07-18 LAB — GLUCOSE, CAPILLARY
Glucose-Capillary: 117 mg/dL — ABNORMAL HIGH (ref 70–99)
Glucose-Capillary: 118 mg/dL — ABNORMAL HIGH (ref 70–99)
Glucose-Capillary: 133 mg/dL — ABNORMAL HIGH (ref 70–99)
Glucose-Capillary: 115 mg/dL — ABNORMAL HIGH (ref 70–99)

## 2013-07-18 LAB — CREATININE, SERUM: CREATININE: 0.45 mg/dL — AB (ref 0.50–1.10)

## 2013-07-18 SURGERY — OPEN REDUCTION INTERNAL FIXATION (ORIF) PATELLA
Anesthesia: General | Site: Knee | Laterality: Left

## 2013-07-18 MED ORDER — CALCIUM CARBONATE-VITAMIN D 600-400 MG-UNIT PO CHEW: 1.0000 | CHEWABLE_TABLET | Freq: Two times a day (BID) | ORAL | Status: DC

## 2013-07-18 MED ORDER — NITROGLYCERIN 0.4 MG SL SUBL
0.4000 mg | SUBLINGUAL_TABLET | SUBLINGUAL | Status: DC | PRN
  Administered 2013-07-20: 0.4 mg via SUBLINGUAL
  Filled 2013-07-18: qty 1

## 2013-07-18 MED ORDER — PROPOFOL 10 MG/ML IV BOLUS
INTRAVENOUS | Status: DC | PRN
  Administered 2013-07-18: 100 mg via INTRAVENOUS

## 2013-07-18 MED ORDER — FENTANYL CITRATE 0.05 MG/ML IJ SOLN
INTRAMUSCULAR | Status: AC
  Filled 2013-07-18: qty 5

## 2013-07-18 MED ORDER — HYDROCODONE-ACETAMINOPHEN 7.5-325 MG PO TABS
1.0000 | ORAL_TABLET | ORAL | Status: DC | PRN
  Administered 2013-07-19: 1 via ORAL
  Administered 2013-07-19: 2 via ORAL
  Administered 2013-07-19: 1 via ORAL
  Administered 2013-07-19: 2 via ORAL
  Administered 2013-07-19: 1 via ORAL
  Administered 2013-07-19 – 2013-07-20 (×2): 2 via ORAL
  Filled 2013-07-18 (×2): qty 2
  Filled 2013-07-18 (×2): qty 1
  Filled 2013-07-18 (×2): qty 2
  Filled 2013-07-18: qty 1

## 2013-07-18 MED ORDER — CITALOPRAM HYDROBROMIDE 40 MG PO TABS
40.0000 mg | ORAL_TABLET | Freq: Every day | ORAL | Status: DC
  Administered 2013-07-18 – 2013-07-22 (×5): 40 mg via ORAL
  Filled 2013-07-18 (×5): qty 1

## 2013-07-18 MED ORDER — PANTOPRAZOLE SODIUM 40 MG PO TBEC
40.0000 mg | DELAYED_RELEASE_TABLET | Freq: Every day | ORAL | Status: DC
  Administered 2013-07-18 – 2013-07-22 (×5): 40 mg via ORAL
  Filled 2013-07-18 (×5): qty 1

## 2013-07-18 MED ORDER — BENZONATATE 100 MG PO CAPS
100.0000 mg | ORAL_CAPSULE | Freq: Three times a day (TID) | ORAL | Status: DC | PRN
  Administered 2013-07-20: 100 mg via ORAL
  Filled 2013-07-18: qty 1

## 2013-07-18 MED ORDER — FUROSEMIDE 20 MG PO TABS
20.0000 mg | ORAL_TABLET | Freq: Every day | ORAL | Status: DC
  Administered 2013-07-18 – 2013-07-22 (×5): 20 mg via ORAL
  Filled 2013-07-18 (×5): qty 1

## 2013-07-18 MED ORDER — ONDANSETRON HCL 4 MG/2ML IJ SOLN
INTRAMUSCULAR | Status: AC
  Filled 2013-07-18: qty 2

## 2013-07-18 MED ORDER — ENOXAPARIN SODIUM 40 MG/0.4ML ~~LOC~~ SOLN
40.0000 mg | SUBCUTANEOUS | Status: DC
  Administered 2013-07-18 – 2013-07-21 (×4): 40 mg via SUBCUTANEOUS
  Filled 2013-07-18 (×6): qty 0.4

## 2013-07-18 MED ORDER — ONDANSETRON HCL 4 MG PO TABS: 4.0000 mg | ORAL_TABLET | Freq: Four times a day (QID) | ORAL | Status: DC | PRN

## 2013-07-18 MED ORDER — CHLORHEXIDINE GLUCONATE 4 % EX LIQD
60.0000 mL | Freq: Once | CUTANEOUS | Status: DC
  Filled 2013-07-18: qty 60

## 2013-07-18 MED ORDER — TRAMADOL HCL 50 MG PO TABS
50.0000 mg | ORAL_TABLET | Freq: Four times a day (QID) | ORAL | Status: DC | PRN
  Administered 2013-07-18 – 2013-07-21 (×7): 50 mg via ORAL
  Filled 2013-07-18 (×8): qty 1

## 2013-07-18 MED ORDER — MIDAZOLAM HCL 5 MG/ML IJ SOLN: 1.0000 mg | Freq: Once | INTRAMUSCULAR | Status: DC

## 2013-07-18 MED ORDER — ADULT MULTIVITAMIN W/MINERALS CH
1.0000 | ORAL_TABLET | Freq: Every day | ORAL | Status: DC
  Administered 2013-07-19 – 2013-07-22 (×4): 1 via ORAL
  Filled 2013-07-18 (×4): qty 1

## 2013-07-18 MED ORDER — LEVOTHYROXINE SODIUM 100 MCG PO TABS
100.0000 ug | ORAL_TABLET | Freq: Every day | ORAL | Status: DC
  Administered 2013-07-19 – 2013-07-22 (×4): 100 ug via ORAL
  Filled 2013-07-18 (×6): qty 1

## 2013-07-18 MED ORDER — FESOTERODINE FUMARATE ER 4 MG PO TB24
4.0000 mg | ORAL_TABLET | Freq: Every day | ORAL | Status: DC
  Filled 2013-07-18: qty 1

## 2013-07-18 MED ORDER — TIZANIDINE HCL 4 MG PO TABS
4.0000 mg | ORAL_TABLET | Freq: Every day | ORAL | Status: DC | PRN
  Administered 2013-07-19 – 2013-07-22 (×11): 4 mg via ORAL
  Filled 2013-07-18 (×13): qty 1

## 2013-07-18 MED ORDER — PROPOFOL 10 MG/ML IV BOLUS
INTRAVENOUS | Status: AC
  Filled 2013-07-18: qty 20

## 2013-07-18 MED ORDER — SODIUM CHLORIDE 0.9 % IV SOLN
INTRAVENOUS | Status: DC
  Administered 2013-07-18: 19:00:00 via INTRAVENOUS

## 2013-07-18 MED ORDER — INSULIN ASPART 100 UNIT/ML ~~LOC~~ SOLN
0.0000 [IU] | Freq: Three times a day (TID) | SUBCUTANEOUS | Status: DC
  Administered 2013-07-18 – 2013-07-22 (×5): 2 [IU] via SUBCUTANEOUS

## 2013-07-18 MED ORDER — OMEGA-3-ACID ETHYL ESTERS 1 G PO CAPS
2.0000 g | ORAL_CAPSULE | Freq: Two times a day (BID) | ORAL | Status: DC
  Administered 2013-07-18 – 2013-07-22 (×8): 2 g via ORAL
  Filled 2013-07-18 (×9): qty 2

## 2013-07-18 MED ORDER — CLONAZEPAM 0.5 MG PO TABS
0.5000 mg | ORAL_TABLET | Freq: Two times a day (BID) | ORAL | Status: DC
  Administered 2013-07-18 – 2013-07-22 (×8): 0.5 mg via ORAL
  Filled 2013-07-18 (×8): qty 1

## 2013-07-18 MED ORDER — OMEGA-3 FATTY ACIDS 1000 MG PO CAPS: 2.0000 g | ORAL_CAPSULE | Freq: Two times a day (BID) | ORAL | Status: DC

## 2013-07-18 MED ORDER — METOPROLOL SUCCINATE 12.5 MG HALF TABLET
12.5000 mg | ORAL_TABLET | Freq: Every day | ORAL | Status: DC
  Administered 2013-07-20: 12.5 mg via ORAL
  Filled 2013-07-18 (×5): qty 1

## 2013-07-18 MED ORDER — VITAMIN D (ERGOCALCIFEROL) 1.25 MG (50000 UNIT) PO CAPS: 50000.0000 [IU] | ORAL_CAPSULE | ORAL | Status: DC

## 2013-07-18 MED ORDER — POTASSIUM CHLORIDE CRYS ER 20 MEQ PO TBCR
20.0000 meq | EXTENDED_RELEASE_TABLET | Freq: Two times a day (BID) | ORAL | Status: DC
  Administered 2013-07-18 – 2013-07-22 (×8): 20 meq via ORAL
  Filled 2013-07-18 (×9): qty 1

## 2013-07-18 MED ORDER — SIMVASTATIN 20 MG PO TABS
20.0000 mg | ORAL_TABLET | Freq: Every day | ORAL | Status: DC
  Administered 2013-07-18 – 2013-07-21 (×4): 20 mg via ORAL
  Filled 2013-07-18 (×6): qty 1

## 2013-07-18 MED ORDER — ONDANSETRON HCL 4 MG/2ML IJ SOLN
INTRAMUSCULAR | Status: DC | PRN
  Administered 2013-07-18: 4 mg via INTRAVENOUS

## 2013-07-18 MED ORDER — INSULIN ASPART 100 UNIT/ML ~~LOC~~ SOLN
4.0000 [IU] | Freq: Three times a day (TID) | SUBCUTANEOUS | Status: DC
  Administered 2013-07-18 – 2013-07-22 (×3): 4 [IU] via SUBCUTANEOUS

## 2013-07-18 MED ORDER — LACTATED RINGERS IV SOLN
INTRAVENOUS | Status: DC
  Administered 2013-07-18: 09:00:00 via INTRAVENOUS

## 2013-07-18 MED ORDER — EPHEDRINE SULFATE 50 MG/ML IJ SOLN
INTRAMUSCULAR | Status: DC | PRN
  Administered 2013-07-18 (×5): 5 mg via INTRAVENOUS

## 2013-07-18 MED ORDER — CYANOCOBALAMIN 250 MCG PO TABS
250.0000 ug | ORAL_TABLET | Freq: Every day | ORAL | Status: DC
  Administered 2013-07-19 – 2013-07-22 (×4): 250 ug via ORAL
  Filled 2013-07-18 (×4): qty 1

## 2013-07-18 MED ORDER — INSULIN ASPART 100 UNIT/ML ~~LOC~~ SOLN: 0.0000 [IU] | Freq: Every day | SUBCUTANEOUS | Status: DC

## 2013-07-18 MED ORDER — FENTANYL CITRATE 0.05 MG/ML IJ SOLN
25.0000 ug | INTRAMUSCULAR | Status: DC | PRN
  Administered 2013-07-18 (×2): 25 ug via INTRAVENOUS
  Administered 2013-07-18: 100 ug via INTRAVENOUS

## 2013-07-18 MED ORDER — METOCLOPRAMIDE HCL 10 MG PO TABS: 5.0000 mg | ORAL_TABLET | Freq: Three times a day (TID) | ORAL | Status: DC | PRN

## 2013-07-18 MED ORDER — ZOLPIDEM TARTRATE 5 MG PO TABS
5.0000 mg | ORAL_TABLET | Freq: Every day | ORAL | Status: DC
  Administered 2013-07-18 – 2013-07-20 (×2): 5 mg via ORAL
  Filled 2013-07-18 (×2): qty 1

## 2013-07-18 MED ORDER — MIDAZOLAM HCL 2 MG/2ML IJ SOLN
INTRAMUSCULAR | Status: AC
  Administered 2013-07-18: 1 mg
  Filled 2013-07-18: qty 2

## 2013-07-18 MED ORDER — HYDROXYZINE HCL 25 MG PO TABS
25.0000 mg | ORAL_TABLET | Freq: Three times a day (TID) | ORAL | Status: DC | PRN
  Administered 2013-07-18 – 2013-07-20 (×2): 25 mg via ORAL
  Filled 2013-07-18 (×2): qty 1

## 2013-07-18 MED ORDER — ROPIVACAINE HCL 5 MG/ML IJ SOLN
INTRAMUSCULAR | Status: DC | PRN
  Administered 2013-07-18: 20 mL via PERINEURAL

## 2013-07-18 MED ORDER — LIDOCAINE HCL (CARDIAC) 20 MG/ML IV SOLN
INTRAVENOUS | Status: AC
  Filled 2013-07-18: qty 5

## 2013-07-18 MED ORDER — FENTANYL CITRATE 0.05 MG/ML IJ SOLN
INTRAMUSCULAR | Status: AC
  Filled 2013-07-18: qty 2

## 2013-07-18 MED ORDER — FENTANYL CITRATE 0.05 MG/ML IJ SOLN
INTRAMUSCULAR | Status: AC
  Administered 2013-07-18: 50 ug
  Filled 2013-07-18: qty 2

## 2013-07-18 MED ORDER — METOPROLOL SUCCINATE 12.5 MG HALF TABLET
12.5000 mg | ORAL_TABLET | Freq: Every day | ORAL | Status: DC
Start: 2013-07-18 — End: 2013-07-18
  Filled 2013-07-18: qty 1

## 2013-07-18 MED ORDER — METFORMIN HCL 500 MG PO TABS
1000.0000 mg | ORAL_TABLET | Freq: Two times a day (BID) | ORAL | Status: DC
  Administered 2013-07-18 – 2013-07-22 (×8): 1000 mg via ORAL
  Filled 2013-07-18 (×10): qty 2

## 2013-07-18 MED ORDER — ENOXAPARIN SODIUM 40 MG/0.4ML ~~LOC~~ SOLN
40.0000 mg | SUBCUTANEOUS | Status: DC
  Filled 2013-07-18: qty 0.4

## 2013-07-18 MED ORDER — VITAMIN B 12 250 MCG PO LOZG: 1.0000 | LOZENGE | Freq: Every day | ORAL | Status: DC

## 2013-07-18 MED ORDER — ONDANSETRON HCL 4 MG/2ML IJ SOLN: 4.0000 mg | Freq: Four times a day (QID) | INTRAMUSCULAR | Status: DC | PRN

## 2013-07-18 MED ORDER — FESOTERODINE FUMARATE ER 4 MG PO TB24
4.0000 mg | ORAL_TABLET | Freq: Every day | ORAL | Status: DC
  Administered 2013-07-18 – 2013-07-21 (×4): 4 mg via ORAL
  Filled 2013-07-18 (×5): qty 1

## 2013-07-18 MED ORDER — CLINDAMYCIN PHOSPHATE 600 MG/50ML IV SOLN
600.0000 mg | Freq: Four times a day (QID) | INTRAVENOUS | Status: AC
  Administered 2013-07-18 – 2013-07-19 (×3): 600 mg via INTRAVENOUS
  Filled 2013-07-18 (×3): qty 50

## 2013-07-18 MED ORDER — ONDANSETRON HCL 4 MG/2ML IJ SOLN: 4.0000 mg | Freq: Once | INTRAMUSCULAR | Status: DC | PRN

## 2013-07-18 MED ORDER — METOCLOPRAMIDE HCL 5 MG/ML IJ SOLN: 5.0000 mg | Freq: Three times a day (TID) | INTRAMUSCULAR | Status: DC | PRN

## 2013-07-18 MED ORDER — CHLORTHALIDONE 25 MG PO TABS
12.5000 mg | ORAL_TABLET | Freq: Every day | ORAL | Status: DC
Start: 2013-07-19 — End: 2013-07-22
  Administered 2013-07-19 – 2013-07-22 (×4): 12.5 mg via ORAL
  Filled 2013-07-18 (×4): qty 0.5

## 2013-07-18 MED ORDER — 0.9 % SODIUM CHLORIDE (POUR BTL) OPTIME
TOPICAL | Status: DC | PRN
  Administered 2013-07-18: 1000 mL

## 2013-07-18 MED ORDER — ASPIRIN 81 MG PO CHEW
81.0000 mg | CHEWABLE_TABLET | Freq: Every day | ORAL | Status: DC
  Administered 2013-07-18 – 2013-07-21 (×4): 81 mg via ORAL
  Filled 2013-07-18 (×7): qty 1

## 2013-07-18 MED ORDER — LIDOCAINE HCL (CARDIAC) 20 MG/ML IV SOLN
INTRAVENOUS | Status: DC | PRN
  Administered 2013-07-18: 40 mg via INTRAVENOUS

## 2013-07-18 MED ORDER — FENTANYL CITRATE 0.05 MG/ML IJ SOLN: 50.0000 ug | Freq: Once | INTRAMUSCULAR | Status: DC

## 2013-07-18 MED ORDER — ALBUTEROL SULFATE (2.5 MG/3ML) 0.083% IN NEBU: 3.0000 mL | INHALATION_SOLUTION | Freq: Four times a day (QID) | RESPIRATORY_TRACT | Status: DC | PRN

## 2013-07-18 MED ORDER — HYDROMORPHONE HCL PF 1 MG/ML IJ SOLN
0.5000 mg | INTRAMUSCULAR | Status: DC | PRN
  Administered 2013-07-18 – 2013-07-19 (×5): 0.5 mg via INTRAVENOUS
  Administered 2013-07-19: 1 mg via INTRAVENOUS
  Administered 2013-07-19 – 2013-07-20 (×2): 0.5 mg via INTRAVENOUS
  Filled 2013-07-18 (×8): qty 1

## 2013-07-18 MED ORDER — CALCIUM CARBONATE-VITAMIN D 500-200 MG-UNIT PO TABS
2.0000 | ORAL_TABLET | Freq: Two times a day (BID) | ORAL | Status: DC
  Administered 2013-07-18 – 2013-07-22 (×8): 2 via ORAL
  Filled 2013-07-18 (×10): qty 2

## 2013-07-18 MED ORDER — FENTANYL CITRATE 0.05 MG/ML IJ SOLN
INTRAMUSCULAR | Status: DC | PRN
  Administered 2013-07-18 (×2): 50 ug via INTRAVENOUS

## 2013-07-18 SURGICAL SUPPLY — 59 items
BANDAGE ELASTIC 4 VELCRO ST LF (GAUZE/BANDAGES/DRESSINGS) IMPLANT
BANDAGE ELASTIC 6 VELCRO ST LF (GAUZE/BANDAGES/DRESSINGS) ×2 IMPLANT
BIT DRILL 3.8 CANN DISP (BIT) ×2 IMPLANT
BLADE SURG ROTATE 9660 (MISCELLANEOUS) IMPLANT
COVER SURGICAL LIGHT HANDLE (MISCELLANEOUS) ×2 IMPLANT
CUFF TOURNIQUET SINGLE 34IN LL (TOURNIQUET CUFF) ×2 IMPLANT
CUFF TOURNIQUET SINGLE 44IN (TOURNIQUET CUFF) IMPLANT
DRAPE C-ARM 42X72 X-RAY (DRAPES) ×2 IMPLANT
DRAPE C-ARMOR (DRAPES) ×2 IMPLANT
DRAPE INCISE IOBAN 66X45 STRL (DRAPES) ×2 IMPLANT
DRAPE U-SHAPE 47X51 STRL (DRAPES) ×4 IMPLANT
DRSG ADAPTIC 3X8 NADH LF (GAUZE/BANDAGES/DRESSINGS) ×2 IMPLANT
DRSG EMULSION OIL 3X3 NADH (GAUZE/BANDAGES/DRESSINGS) IMPLANT
DRSG PAD ABDOMINAL 8X10 ST (GAUZE/BANDAGES/DRESSINGS) IMPLANT
ELECT NEEDLE BLADE 2-5/6 (NEEDLE) ×2 IMPLANT
ELECT NEEDLE TIP 2.8 STRL (NEEDLE) ×2 IMPLANT
ELECT REM PT RETURN 9FT ADLT (ELECTROSURGICAL) ×2
ELECTRODE REM PT RTRN 9FT ADLT (ELECTROSURGICAL) ×1 IMPLANT
GLOVE BIOGEL PI ORTHO PRO 7.5 (GLOVE) ×1
GLOVE BIOGEL PI ORTHO PRO SZ8 (GLOVE) ×1
GLOVE ORTHO TXT STRL SZ7.5 (GLOVE) ×2 IMPLANT
GLOVE PI ORTHO PRO STRL 7.5 (GLOVE) ×1 IMPLANT
GLOVE PI ORTHO PRO STRL SZ8 (GLOVE) ×1 IMPLANT
GLOVE SURG ORTHO 8.5 STRL (GLOVE) ×2 IMPLANT
GOWN STRL REIN XL XLG (GOWN DISPOSABLE) IMPLANT
KIT BASIN OR (CUSTOM PROCEDURE TRAY) ×2 IMPLANT
KIT ROOM TURNOVER OR (KITS) ×2 IMPLANT
MANIFOLD NEPTUNE II (INSTRUMENTS) IMPLANT
NEEDLE 22X1 1/2 (OR ONLY) (NEEDLE) IMPLANT
NS IRRIG 1000ML POUR BTL (IV SOLUTION) ×2 IMPLANT
PACK ORTHO EXTREMITY (CUSTOM PROCEDURE TRAY) ×2 IMPLANT
PAD ABD 8X10 STRL (GAUZE/BANDAGES/DRESSINGS) ×2 IMPLANT
PAD ARMBOARD 7.5X6 YLW CONV (MISCELLANEOUS) ×4 IMPLANT
PAD CAST 4YDX4 CTTN HI CHSV (CAST SUPPLIES) IMPLANT
PADDING CAST ABS 6INX4YD NS (CAST SUPPLIES) ×2
PADDING CAST ABS COTTON 6X4 NS (CAST SUPPLIES) ×2 IMPLANT
PADDING CAST COTTON 4X4 STRL (CAST SUPPLIES)
PIN GUIDE THREADED 5/64X9-7MM (PIN) ×2 IMPLANT
RETRIEVER SUT HEWSON (MISCELLANEOUS) ×2 IMPLANT
SCREW CANN 5.0X50MM (Screw) ×1 IMPLANT
SCREW CANN SM 50X5XSLF DRL CAN (Screw) ×1 IMPLANT
SCREW LAG CANN CANC 5.0 20X55 (Screw) ×2 IMPLANT
SPONGE GAUZE 4X4 12PLY (GAUZE/BANDAGES/DRESSINGS) ×2 IMPLANT
SPONGE LAP 4X18 X RAY DECT (DISPOSABLE) ×2 IMPLANT
STAPLER VISISTAT 35W (STAPLE) ×2 IMPLANT
SUCTION FRAZIER TIP 10 FR DISP (SUCTIONS) ×2 IMPLANT
SUT FIBERWIRE #2 38 REV NDL BL (SUTURE)
SUT VIC AB 0 CT1 27 (SUTURE) ×2
SUT VIC AB 0 CT1 27XBRD ANBCTR (SUTURE) ×2 IMPLANT
SUT VIC AB 2-0 CT1 27 (SUTURE) ×2
SUT VIC AB 2-0 CT1 TAPERPNT 27 (SUTURE) ×2 IMPLANT
SUTURE FIBERWR#2 38 REV NDL BL (SUTURE) IMPLANT
SYR CONTROL 10ML LL (SYRINGE) ×2 IMPLANT
TOWEL OR 17X24 6PK STRL BLUE (TOWEL DISPOSABLE) ×2 IMPLANT
TOWEL OR 17X26 10 PK STRL BLUE (TOWEL DISPOSABLE) ×2 IMPLANT
TUBE CONNECTING 12X1/4 (SUCTIONS) ×2 IMPLANT
UNDERPAD 30X30 INCONTINENT (UNDERPADS AND DIAPERS) IMPLANT
WATER STERILE IRR 1000ML POUR (IV SOLUTION) IMPLANT
YANKAUER SUCT BULB TIP NO VENT (SUCTIONS) ×2 IMPLANT

## 2013-07-18 NOTE — Transfer of Care (Signed)
Immediate Anesthesia Transfer of Care Note  Patient: [name]  Procedure(s) Performed: Procedure(s): OPEN REDUCTION INTERNAL (ORIF) LEFT FIXATION PATELLA (Left)  Patient Location: PACU  Anesthesia Type:General  Level of Consciousness: awake, alert  and oriented  Airway & Oxygen Therapy: Patient Spontanous Breathing and Patient connected to nasal cannula oxygen  Post-op Assessment: Report given to PACU RN, Post -op Vital signs reviewed and stable and Patient moving all extremities X 4  Post vital signs: Reviewed and stable  Complications: No apparent anesthesia complications

## 2013-07-18 NOTE — Discharge Instructions (Signed)
Ice to the knee, elevate when possible  Try not to bear weight on the left leg, if have to then place knee straight and apply knee immobilizer securely.  Keep dressing in place for 5 days, then ok to change bandage each day.  Keep dry for 10 days, then ok to get wet in shower  Follow up with Dr Veverly Fells in two weeks  [phone]

## 2013-07-18 NOTE — Preoperative (Signed)
Beta Blockers   Reason not to administer Beta Blockers:BB taken at 2130 on 07/17/13

## 2013-07-18 NOTE — Progress Notes (Signed)
Orthopedic Tech Progress Note Patient Details:  [name] 19-Nov-1945 [id number] OHF applied to bed Patient ID: [name], female   DOB: [birth year], 68 y.o.   MRN: [id number]   [name] 07/18/2013, 3:54 PM

## 2013-07-18 NOTE — Progress Notes (Signed)
Patient states that she is supposed to use CPAP at home but does not.  She does not want to use our machine at this time.  RT made her aware that if she changed her mind to let her RN know and RT would be glad to bring her a machine to try.

## 2013-07-18 NOTE — Anesthesia Procedure Notes (Addendum)
Anesthesia Regional Block:  Femoral nerve block  Pre-Anesthetic Checklist: ,, timeout performed, Correct Patient, Correct Site, Correct Laterality, Correct Procedure, Correct Position, site marked, Risks and benefits discussed,  Surgical consent,  Pre-op evaluation,  At surgeon's request and post-op pain management  Laterality: Lower and Left  Prep: chloraprep       Needles:  Injection technique: Single-shot  Needle Type: Echogenic Stimulator Needle          Additional Needles:  Procedures: ultrasound guided (picture in chart) Femoral nerve block Narrative:  Start time: 07/18/2013 9:47 AM End time: 07/18/2013 9:56 AM Injection made incrementally with aspirations every 5 mL.  Performed by: Personally  Anesthesiologist: Moser  Additional Notes: H+P and labs reviewed, risks and benefits discussed with patient, procedure tolerated well without complications   Procedure Name: LMA Insertion Date/Time: 07/18/2013 10:24 AM Performed by: Rush Farmer E Pre-anesthesia Checklist: Patient identified, Emergency Drugs available, Suction available, Patient being monitored and Timeout performed Patient Re-evaluated:Patient Re-evaluated prior to inductionOxygen Delivery Method: Circle system utilized Preoxygenation: Pre-oxygenation with 100% oxygen Intubation Type: IV induction and Inhalational induction Ventilation: Mask ventilation without difficulty LMA: LMA inserted LMA Size: 4.0 Number of attempts: 1 Placement Confirmation: positive ETCO2 and breath sounds checked- equal and bilateral Tube secured with: Tape Dental Injury: Teeth and Oropharynx as per pre-operative assessment  Comments: LMA inserted per Dr. Ermalene Postin.

## 2013-07-18 NOTE — Progress Notes (Signed)
Utilization review completed.  

## 2013-07-18 NOTE — Anesthesia Preprocedure Evaluation (Addendum)
Anesthesia Evaluation  Patient identified by MRN, date of birth, ID band Patient awake    Reviewed: Allergy & Precautions, H&P , Patient's Chart, lab work & pertinent test results, reviewed documented beta blocker date and time   History of Anesthesia Complications Negative for: history of anesthetic complications  Airway Mallampati: II TM Distance: >3 FB Neck ROM: Full    Dental  (+) Partial Upper, Partial Lower,    Pulmonary shortness of breath and with exertion, asthma , neg sleep apnea, neg COPDneg recent URI,  breath sounds clear to auscultation        Cardiovascular hypertension, Pt. on home beta blockers - angina+ CAD and +CHF - Past MI - dysrhythmias Rhythm:Regular     Neuro/Psych PSYCHIATRIC DISORDERS Anxiety S/p polio with right leg weakness    GI/Hepatic Neg liver ROS, GERD-  Medicated and Controlled,  Endo/Other  diabetes, Well ControlledHypothyroidism   Renal/GU negative Renal ROS     Musculoskeletal   Abdominal   Peds  Hematology negative hematology ROS (+)   Anesthesia Other Findings   Reproductive/Obstetrics                          Anesthesia Physical Anesthesia Plan  ASA: III  Anesthesia Plan: General and Regional   Post-op Pain Management:    Induction: Intravenous  Airway Management Planned: LMA  Additional Equipment: None  Intra-op Plan:   Post-operative Plan: Extubation in OR  Informed Consent: I have reviewed the patients History and Physical, chart, labs and discussed the procedure including the risks, benefits and alternatives for the proposed anesthesia with the patient or authorized representative who has indicated his/her understanding and acceptance.   Dental advisory given  Plan Discussed with: Surgeon and CRNA  Anesthesia Plan Comments:        Anesthesia Quick Evaluation

## 2013-07-18 NOTE — Interval H&P Note (Signed)
History and Physical Interval Note:  07/18/2013 9:46 AM  [name]  has presented today for surgery, with the diagnosis of left patella fracture  The various methods of treatment have been discussed with the patient and family. After consideration of risks, benefits and other options for treatment, the patient has consented to  Procedure(s): OPEN REDUCTION INTERNAL (ORIF) LEFT FIXATION PATELLA (Left) as a surgical intervention .  The patient's history has been reviewed, patient examined, no change in status, stable for surgery.  I have reviewed the patient's chart and labs.  Questions were answered to the patient's satisfaction.     Blong Busk,STEVEN R

## 2013-07-18 NOTE — Brief Op Note (Signed)
07/18/2013  12:05 PM  PATIENT:  [name]  68 y.o. female  PRE-OPERATIVE DIAGNOSIS:  left patella fracture, displaced  POST-OPERATIVE DIAGNOSIS:  left patella fracture, displaced  PROCEDURE:  Procedure(s): OPEN REDUCTION INTERNAL (ORIF) LEFT FIXATION PATELLA (Left), Biomet 5.0 cannulated screws and 18 gauge wire  SURGEON:  Surgeon(s) and Role:    * Augustin Schooling, MD - Primary  PHYSICIAN ASSISTANT:   ASSISTANTS: Ventura Bruns, PA-C   ANESTHESIA:   general  EBL:  Total I/O In: 700 [I.V.:700] Out: 25 [Blood:25]  BLOOD ADMINISTERED:none  DRAINS: none   LOCAL MEDICATIONS USED:  NONE  SPECIMEN:  No Specimen  DISPOSITION OF SPECIMEN:  N/A  COUNTS:  YES  TOURNIQUET:   Total Tourniquet Time Documented: Thigh (Left) - 58 minutes Total: Thigh (Left) - 58 minutes   DICTATION: .Other Dictation: Dictation Number [id number]  PLAN OF CARE: Admit to inpatient   PATIENT DISPOSITION:  PACU - hemodynamically stable.   Delay start of Pharmacological VTE agent (>24hrs) due to surgical blood loss or risk of bleeding: no

## 2013-07-18 NOTE — Anesthesia Postprocedure Evaluation (Signed)
  Anesthesia Post-op Note  Patient: [name]  Procedure(s) Performed: Procedure(s): OPEN REDUCTION INTERNAL (ORIF) LEFT FIXATION PATELLA (Left)  Patient Location: PACU  Anesthesia Type:General and Regional  Level of Consciousness: awake, alert  and oriented  Airway and Oxygen Therapy: Patient Spontanous Breathing  Post-op Pain: moderate  Post-op Assessment: Post-op Vital signs reviewed, Patient's Cardiovascular Status Stable, Respiratory Function Stable, Patent Airway, No signs of Nausea or vomiting and Pain level controlled  Post-op Vital Signs: Reviewed and stable  Complications: No apparent anesthesia complications

## 2013-07-19 LAB — GLUCOSE, CAPILLARY
GLUCOSE-CAPILLARY: 108 mg/dL — AB (ref 70–99)
Glucose-Capillary: 138 mg/dL — ABNORMAL HIGH (ref 70–99)
Glucose-Capillary: 130 mg/dL — ABNORMAL HIGH (ref 70–99)
Glucose-Capillary: 91 mg/dL (ref 70–99)

## 2013-07-19 NOTE — Progress Notes (Signed)
PT does not want to wear CPAP at this time.

## 2013-07-19 NOTE — Progress Notes (Signed)
PT Cancellation Note  Patient Details Name: [name] MRN: [id number] DOB: [birth year]   Cancelled Treatment:    Reason Eval/Treat Not Completed: Pain limiting ability to participate  PT eval attempted in AM and PM.  Pt reporting 10/10 pain and unable to participate.  RN aware. PT to re-attempt tomorrow AM.   [name] 07/19/2013, 1:46 PM  [name], PT  Office # [phone] Pager [phone]

## 2013-07-19 NOTE — Op Note (Signed)
NAMEGLORI, MACHNIK NO.:  [id number]  MEDICAL RECORD NO.:  [id number]  LOCATION:  5N07C                        FACILITY:  [facility]  PHYSICIAN:  Doran Heater. Veverly Fells, M.D. DATE OF BIRTH:  1945-07-14  DATE OF PROCEDURE:  07/18/2013 DATE OF DISCHARGE:                              OPERATIVE REPORT   PREOPERATIVE DIAGNOSIS:  Displaced left patella fracture.  POSTOPERATIVE DIAGNOSIS:  Displaced left patella fracture.  PROCEDURE PERFORMED:  Open reduction and internal fixation of left displaced patella fracture using Biomet 5.0 cannulated screws and 18- gauge stainless steel wire, tension band technique.  ATTENDING SURGEON:  Doran Heater. Veverly Fells, MD.  ASSISTANT:  Abbott Pao. Dixon, PA-C, who scrubbed the entire procedure and necessary for satisfactory completion of surgery.  General anesthesia was used.  ESTIMATED BLOOD LOSS:  Minimal.  FLUID REPLACEMENT:  1000 mL crystalloid.  INSTRUMENT COUNTS:  Correct.  There were no complications.  Perioperative antibiotics were given.  INDICATIONS:  The patient is a 68 year old female status post fall, while obtaining a chest x-ray at the doctor's office, injuring her left knee.  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  She does pivot and transfer on her left leg.  That is her strong leg.  Her right leg is weak, it does not support her weight.  Unfortunately, x-rays demonstrated a displaced patella fracture, CT scan is verifying that.  I counseled the patient regarding options for management and recommended surgery to restore the extensor mechanism __________ body weight in the left leg. The risks and benefits were discussed.  Informed consent obtained.  DESCRIPTION OF PROCEDURE:  After an adequate level of anesthesia achieved, the patient was positioned supine on the operating room table. Left leg correctly identified.  Time-out called.  We did a  nonsterile tourniquet on the proximal thigh.  Left leg was sterilely prepped and draped in usual manner.  Time-out called.  We then went ahead and exsanguinated the limb with an Esmarch bandage and elevated tourniquet to 350 mmHg.  Longitudinal incision was created through the patient's prior incision.  She had about 4 longitudinal incisions, 2 pretty close together.  They were quite old incisions over the front of the knee.  We just chosen 1 was more midline extending that distally just about 3-4 cm, but primarily utilizing the patient's prior incision.  Dissection down through the subcutaneous tissues using needle-tip Bovie.  We identified the fractured patella with maybe a centimeter displacement. The retinaculum was intact.  We irrigated and cleaned out the fracture site, compressed the fracture together, used a tenaculum clamp, and then C-arm fluoroscopy, which was draped into the field.  Verified adequate reduction of her fracture.  We then placed guide pins from proximal to distal for the 5.0 cannulated screw set for Biomet.  Once we had our guide pins placed and verified on C-arm, we drilled just the cortex proximally and then introduced 50 and 55 mm screws across the fracture and then went ahead and threaded 18-gauge wire in a tension band technique across the patella, and coming out of the cannulated screws proximally and distally.  We then used wire twisters  to tension the tension band construct, clipped the remaining wire and twisted as prominent areas of the stainless steel wire down and away from the skin. We obtained final x-rays.  We were pleased with the hardware position. We thoroughly irrigated the wound and then repaired with 0 and 2-0 Vicryl subcutaneous closure and staples.  Sterile compressive bandage was applied.  The patient was taken to the recovery room in stable condition.     Doran Heater. Veverly Fells, M.D.     SRN/MEDQ  D:  07/18/2013  T:  07/19/2013  Job:   [id number]

## 2013-07-19 NOTE — Progress Notes (Signed)
Subjective: 1 Day Post-Op Procedure(s) (LRB): OPEN REDUCTION INTERNAL (ORIF) LEFT FIXATION PATELLA (Left) Patient reports pain as moderate.  Still groggy per her husband at bedside.  Tolerating a regular diet.    Objective: Vital signs in last 24 hours: Temp:  [96.9 F (36.1 C)-98.4 F (36.9 C)] 97.9 F (36.6 C) (03/07 0730) Pulse Rate:  [47-73] 58 (03/07 0730) Resp:  [10-31] 18 (03/07 0730) BP: (98-137)/(37-76) 118/40 mmHg (03/07 0730) SpO2:  [94 %-100 %] 96 % (03/07 0730)  Intake/Output from previous day: 03/06 0701 - 03/07 0700 In: 1296.7 [I.V.:1296.7] Out: 400 [Urine:375; Blood:25] Intake/Output this shift:     Recent Labs  07/17/13 1600 07/18/13 1630  HGB 12.5 11.5*    Recent Labs  07/17/13 1600 07/18/13 1630  WBC 7.7 8.9  RBC 4.10 3.81*  HCT 36.4 34.3*  PLT 440* 391    Recent Labs  07/17/13 1600 07/18/13 1630  NA 134*  --   K 4.4  --   CL 96  --   CO2 26  --   BUN 7  --   CREATININE 0.45* 0.45*  GLUCOSE 103*  --   CALCIUM 9.5  --    No results found for this basename: LABPT, INR,  in the last 72 hours  PE:  wn wd woman in nad.  sleepy this morning.  Knee wound dressed and dry.  5/5 strength in PF.  palpable dp pulse.  Assessment/Plan: 1 Day Post-Op Procedure(s) (LRB): OPEN REDUCTION INTERNAL (ORIF) LEFT FIXATION PATELLA (Left) Pain control today.  Likely home tomorrow.  [name] 07/19/2013, 7:57 AM

## 2013-07-20 DIAGNOSIS — M419 Scoliosis, unspecified: Secondary | ICD-10-CM | POA: Diagnosis present

## 2013-07-20 DIAGNOSIS — G14 Postpolio syndrome: Secondary | ICD-10-CM

## 2013-07-20 LAB — GLUCOSE, CAPILLARY
GLUCOSE-CAPILLARY: 145 mg/dL — AB (ref 70–99)
Glucose-Capillary: 114 mg/dL — ABNORMAL HIGH (ref 70–99)
Glucose-Capillary: 115 mg/dL — ABNORMAL HIGH (ref 70–99)
Glucose-Capillary: 91 mg/dL (ref 70–99)

## 2013-07-20 MED ORDER — DOCUSATE SODIUM 100 MG PO CAPS
100.0000 mg | ORAL_CAPSULE | Freq: Two times a day (BID) | ORAL | Status: DC
  Administered 2013-07-20 – 2013-07-22 (×4): 100 mg via ORAL
  Filled 2013-07-20 (×5): qty 1

## 2013-07-20 MED ORDER — HYDROMORPHONE HCL 2 MG PO TABS
2.0000 mg | ORAL_TABLET | ORAL | Status: DC | PRN
  Administered 2013-07-20 – 2013-07-22 (×10): 2 mg via ORAL
  Filled 2013-07-20 (×11): qty 1

## 2013-07-20 MED ORDER — BISACODYL 10 MG RE SUPP
10.0000 mg | Freq: Every day | RECTAL | Status: DC | PRN
  Administered 2013-07-20: 10 mg via RECTAL
  Filled 2013-07-20: qty 1

## 2013-07-20 MED ORDER — POLYETHYLENE GLYCOL 3350 17 G PO PACK
17.0000 g | PACK | Freq: Every day | ORAL | Status: DC
  Administered 2013-07-21 – 2013-07-22 (×2): 17 g via ORAL
  Filled 2013-07-20 (×2): qty 1

## 2013-07-20 NOTE — Progress Notes (Signed)
Pt does not wear CPAP at home. Pt stated she wears mouthpiece that was prescribed by MD in place of CPAP.

## 2013-07-20 NOTE — Progress Notes (Signed)
1930 Patient reports chest pain. Began 2L oxygen via nasal cannula, and given 1 nitro tablet at 1935. Vital signs 101/52, pulse 66, O2 98. Rechecked patient status at 75. 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 STAT EKG ordered.

## 2013-07-20 NOTE — Progress Notes (Signed)
Orthopedics Progress Note  Subjective: Pain poorly controlled  Objective:  Filed Vitals:   07/20/13 0558  BP: 127/59  Pulse: 79  Temp: 98 F (36.7 C)  Resp: 18    General: Awake and alert  Musculoskeletal: left leg dressing intact, wiggles toes Neurovascularly intact  Lab Results  Component Value Date   WBC 8.9 07/18/2013   HGB 11.5* 07/18/2013   HCT 34.3* 07/18/2013   MCV 90.0 07/18/2013   PLT 391 07/18/2013       Component Value Date/Time   NA 134* 07/17/2013 1600   K 4.4 07/17/2013 1600   CL 96 07/17/2013 1600   CO2 26 07/17/2013 1600   GLUCOSE 103* 07/17/2013 1600   BUN 7 07/17/2013 1600   CREATININE 0.45* 07/18/2013 1630   CALCIUM 9.5 07/17/2013 1600   GFRNONAA >90 07/18/2013 1630   GFRAA >90 07/18/2013 1630    Lab Results  Component Value Date   INR 1.17 06/08/2010   INR 0.90 01/24/2010    Assessment/Plan: POD #2 s/p Procedure(s): OPEN REDUCTION INTERNAL (ORIF) LEFT FIXATION PATELLA Change pain medication to oral dilaudid Continue mobilization OOB to chair, NWB on left LE Possible D/C tomorrow  Remo Lipps R. Veverly Fells, MD 07/20/2013 9:47 AM

## 2013-07-20 NOTE — Evaluation (Signed)
Physical Therapy Evaluation Patient Details Name: [name] MRN: [id number] DOB: 08/28/1945 Today's Date: 07/20/2013 Time: 1245-8099 PT Time Calculation (min): 21 min  PT Assessment / Plan / Recommendation History of Present Illness  68 y/o female that uses a scooter and wheelchair normally had a recent fall injuring left knee. X-rays and CT scan demonstrate a displaced left patella fracture with end stage osteoarthritis in the knee. Pt c/o moderate to severe pain with rom of left lower extremity. Left lower extremity is her dominant side with transfers and ambulation. Risks and benefits of surgery discussed and pt would like to proceed with surgery so that she begin weight bearing on the left lower extremity without flexion; Now s/p ORIF L patella; most current order is for NWB LLE  Clinical Impression   Patient is s/p above surgery resulting in functional limitations due to the deficits listed below (see PT Problem List).  Patient will benefit from skilled PT to increase their independence and safety with mobility to allow discharge to the venue listed below.    Prior to arrival, pt was at wheelchair/transfer functional level, and pt and husband managed relatively well; With LLE NWBing, it has made transfers much more painful and difficult; Still, they would like to dc home -- see below for equipment and services recs.      PT Assessment  Patient needs continued PT services    Follow Up Recommendations  Home health PT;Supervision/Assistance - 24 hour (HHOT; HHAide)    Does the patient have the potential to tolerate intense rehabilitation      Barriers to Discharge Inaccessible home environment Recommend hoyer lift to assist with transfers at home    [facility] bed [facility] Lift; long sliding board; Drop-arm BSC)    Recommendations for Other Services Other (comment) (Case Mgmnt)  Frequency Min 3X/week    Precautions / Restrictions Precautions Precautions:  Fall;Other (comment) (Gentle AROM L knee) Restrictions LLE Weight Bearing: Non weight bearing   Pertinent Vitals/Pain 8/10 pain L knee patient repositioned for comfort       Mobility  Bed Mobility Overal bed mobility: Needs Assistance Bed Mobility: Supine to Sit Supine to sit: Supervision General bed mobility comments: Pulled self form semi-supine position with HOB elevated to long sit with bed rails Transfers Overall transfer level: Needs assistance Equipment used: None (Bed Pad to assist with hip scoot) Transfers: Anterior-Posterior Transfer Anterior-Posterior transfers: Max assist;+2 safety/equipment General transfer comment: Husband present and provided correct assist; Used bed pad to scoot hips in prep for ant/post; Pt also able to assist once she could reach the armrests of recliner    Exercises     PT Diagnosis: Acute pain;Generalized weakness  PT Problem List: Decreased strength;Decreased range of motion;Decreased activity tolerance;Decreased mobility;Decreased coordination;Decreased knowledge of use of DME;Pain PT Treatment Interventions: DME instruction;Functional mobility training;Therapeutic activities;Therapeutic exercise;Patient/family education     PT Goals(Current goals can be found in the care plan section) Acute Rehab PT Goals Patient Stated Goal: go home PT Goal Formulation: With patient Time For Goal Achievement: 08/03/13 Potential to Achieve Goals: Good  Visit Information  Last PT Received On: 07/20/13 Assistance Needed: +1 History of Present Illness: 68 y/o female that uses a scooter and wheelchair normally had a recent fall injuring left knee. X-rays and CT scan demonstrate a displaced left patella fracture with end stage osteoarthritis in the knee. Pt c/o moderate to severe pain with rom of left lower extremity. Left lower extremity is her dominant side with transfers and ambulation.  Risks and benefits of surgery discussed and pt would like to proceed  with surgery so that she begin weight bearing on the left lower extremity without flexion; Now s/p ORIF L patella; most current order is for NWB LLE       Prior [place] expects to be discharged to:: Private residence Living Arrangements: Spouse/significant other Available Help at Discharge: Family;Available 24 hours/day Type of Home: House Home Access: Ramped entrance Home Layout: One level Home Equipment: Transport planner;Wheelchair - manual;Other (comment) Lucianne Lei with Wheelchair lift) Prior Function Level of Independence: Needs assistance Gait / Transfers Assistance Needed: At least mod assist from husband; they describe a combination of lateral scoot and ant/post transfers to her wheelchair, which she sues for mobility ADL's / Homemaking Assistance Needed: Assist from husband Comments: Pt and husband have developed great ways to manage at home Communication Communication: No difficulties    Cognition  Cognition Arousal/Alertness: Awake/alert Behavior During Therapy: WFL for tasks assessed/performed Overall Cognitive Status: Within Functional Limits for tasks assessed    Extremity/Trunk Assessment Upper Extremity Assessment Upper Extremity Assessment: Overall WFL for tasks assessed;Generalized weakness;Defer to OT evaluation Lower Extremity Assessment Lower Extremity Assessment: RLE deficits/detail;LLE deficits/detail RLE Deficits / Details: did not note any voluntary motion RLE during session LLE Deficits / Details: Wrapped in ACE foot to hip; some ankle motion; any gentle knee ROM attempted is limited by pain   Balance    End of Session PT - End of Session Equipment Utilized During Treatment: Other (comment) (bed pad) Activity Tolerance: Patient tolerated treatment well Patient left: in chair;with call bell/phone within reach;with family/visitor present Nurse Communication: Mobility status  GP     Roney Marion Hamff 07/20/2013, 3:50  PM  Roney Marion, [place] Pager [phone] Office [phone]

## 2013-07-21 LAB — GLUCOSE, CAPILLARY
GLUCOSE-CAPILLARY: 138 mg/dL — AB (ref 70–99)
Glucose-Capillary: 111 mg/dL — ABNORMAL HIGH (ref 70–99)
Glucose-Capillary: 80 mg/dL (ref 70–99)
Glucose-Capillary: 67 mg/dL — ABNORMAL LOW (ref 70–99)
Glucose-Capillary: 103 mg/dL — ABNORMAL HIGH (ref 70–99)

## 2013-07-21 NOTE — Progress Notes (Signed)
Hypoglycemic Event  CBG: 67  Treatment: 15 GM carbohydrate snack  Symptoms: None  Follow-up CBG: Time:1653 CBG Result:138  Possible Reasons for Event: Inadequate meal intake  Comments/MD notified no    Evlyn Clines  Remember to initiate Hypoglycemia Order Set & complete

## 2013-07-21 NOTE — Progress Notes (Signed)
   Subjective: 3 Days Post-Op Procedure(s) (LRB): OPEN REDUCTION INTERNAL (ORIF) LEFT FIXATION PATELLA (Left)  Pt c/o moderate to severe constant pain in the knee Denies any new symptoms Patient reports pain as severe.  Objective:   VITALS:   Filed Vitals:   07/21/13 0526  BP: 126/49  Pulse: 56  Temp: 98.2 F (36.8 C)  Resp:     Left lower leg dressing intact nv intact distally No rashes or edema  LABS  Recent Labs  07/18/13 1630  HGB 11.5*  HCT 34.3*  WBC 8.9  PLT 391     Recent Labs  07/18/13 1630  CREATININE 0.45*     Assessment/Plan: 3 Days Post-Op Procedure(s) (LRB): OPEN REDUCTION INTERNAL (ORIF) LEFT FIXATION PATELLA (Left)  Continued issues with pain control Continue transfers as able Will continue to monitor her progress    Merla Riches, MPAS, PA-C  07/21/2013, 11:35 AM

## 2013-07-21 NOTE — Progress Notes (Signed)
Physical Therapy Treatment Patient Details Name: [name] MRN: [id number] DOB: [birth year] Today's Date: 07/21/2013 Time: 3474-2595 PT Time Calculation (min): 36 min  PT Assessment / Plan / Recommendation  History of Present Illness 68 y/o female that uses a scooter and wheelchair normally had a recent fall injuring left knee. X-rays and CT scan demonstrate a displaced left patella fracture with end stage osteoarthritis in the knee. Pt c/o moderate to severe pain with rom of left lower extremity. Left lower extremity is her dominant side with transfers and ambulation. Risks and benefits of surgery discussed and pt would like to proceed with surgery so that she begin weight bearing on the left lower extremity without flexion; Now s/p ORIF L patella; most current order is for NWB LLE   PT Comments   Pt is progressing well and husband is learning safe transfer techniques, demonstrating understanding of safety and technique for mobility with physical therapy. Continue to recommend d/c to home with HHPT. Pt will benefit from continued PT services and will progress independence with functional mobility until d/c.  Follow Up Recommendations  Home health PT;Supervision/Assistance - 24 hour (HHOT; HHAide)     Does the patient have the potential to tolerate intense rehabilitation     Barriers to Discharge        Equipment Recommendations  Other (comment);Hospital bed [facility] Lift; long sliding board; Drop-arm [facility])    Recommendations for Other Services Other (comment) (Case Mgmnt for optimal services)  Frequency Min 3X/week   Progress towards PT Goals Progress towards PT goals: Progressing toward goals  Plan Current plan remains appropriate    Precautions / Restrictions Precautions Precautions: Fall;Other (comment) (gentle AROM of L knee) Restrictions Weight Bearing Restrictions: Yes LLE Weight Bearing: Non weight bearing   Pertinent Vitals/Pain Pt reports 9/10 pain Nurse was notified via  telephone Pt repositioned for comfort in reclining chair with knee extended and heels elevated    Mobility  Bed Mobility Overal bed mobility: Needs Assistance Bed Mobility: Supine to Sit Supine to sit: Supervision General bed mobility comments: Verbal cues for sequencing. Able to pull self up with HOB elevated using bed rails Transfers Overall transfer level: Needs assistance Equipment used: None (Sheet used to reduce friction with transfer) Transfers: Government social research officer transfers: +2 safety/equipment;Min assist General transfer comment: Transfer training using anterior/posterior technique, educating husband on safe handling and assistance. Pt needs Min A with scooting from reclining chair to bed due to the incline and used a sheet to reduce friction while scooting hips forward, however transfering back to chair from bed she did not need physical assist.    Exercises Total Joint Exercises Ankle Circles/Pumps: AAROM;Left;10 reps;Seated Quad Sets: AROM;Left;10 reps;Seated (leg in extension on reclining chair)   PT Diagnosis:    PT Problem List:   PT Treatment Interventions:     PT Goals (current goals can now be found in the care plan section) Acute Rehab PT Goals PT Goal Formulation: With patient Time For Goal Achievement: 08/03/13 Potential to Achieve Goals: Good  Visit Information  Last PT Received On: 07/21/13 Assistance Needed: +1 History of Present Illness: 68 y/o female that uses a scooter and wheelchair normally had a recent fall injuring left knee. X-rays and CT scan demonstrate a displaced left patella fracture with end stage osteoarthritis in the knee. Pt c/o moderate to severe pain with rom of left lower extremity. Left lower extremity is her dominant side with transfers and ambulation. Risks and benefits of surgery discussed and pt would like  to proceed with surgery so that she begin weight bearing on the left lower extremity without flexion;  Now s/p ORIF L patella; most current order is for NWB LLE    Subjective Data  Subjective: Willing to work with therapy on transfers   Cognition  Cognition Arousal/Alertness: Awake/alert Behavior During Therapy: WFL for tasks assessed/performed Overall Cognitive Status: Within Functional Limits for tasks assessed    Balance     End of Session PT - End of Session Equipment Utilized During Treatment: Other (comment) (Sheet to reduce friction with transfer) Activity Tolerance: Patient tolerated treatment well Patient left: in chair;with call bell/phone within reach;with family/visitor present Nurse Communication: Patient requests pain meds   GP    [place], [place]  Ellouise Newer 07/21/2013, 1:10 PM

## 2013-07-21 NOTE — Progress Notes (Signed)
Occupational Therapy Evaluation Patient Details Name: [name] MRN: [id number] DOB: [birth year] Today's Date: 07/21/2013 Time: 2482-5003 OT Time Calculation (min): 65 min  OT Assessment / Plan / Recommendation History of present illness 68 y/o female that uses a scooter and wheelchair normally had a recent fall injuring left knee. X-rays and CT scan demonstrate a displaced left patella fracture with end stage osteoarthritis in the knee. Pt c/o moderate to severe pain with rom of left lower extremity. Left lower extremity is her dominant side with transfers and ambulation. Risks and benefits of surgery discussed and pt would like to proceed with surgery so that she begin weight bearing on the left lower extremity without flexion; Now s/p ORIF L patella; most current order is for NWB LLE   Clinical Impression   PTA pt lived at home with her husband. Pt reports that her husband and a friend helped with most ADLs including dressing and bathing. Pt was setup for grooming in supported sitting this date. Pt reports that she used a scooter and manual w/c at home and transferred to her standard toilet using grab bars for a squat pivot transfer. Pt would benefit from continued OT for transfer training for independence with ADLs using drop arm recliner and drop arm bedside commode.    OT Assessment  Patient needs continued OT Services    Follow Up Recommendations  Home health OT;Supervision/Assistance - 24 hour;Other (comment) (HHAide)       Equipment Recommendations  Other (comment) (drop arm BSC, hoyer lift, long sliding board, hospital bed)       Frequency  Min 2X/week    Precautions / Restrictions Precautions Precautions: Fall;Other (comment) (gentle AROM of L knee) Restrictions Weight Bearing Restrictions: Yes LLE Weight Bearing: Non weight bearing   Pertinent Vitals/Pain Pt reports "some pain" in LLE but did not provide pain level    ADL  Eating/Feeding: Independent Where Assessed -  Eating/Feeding: Chair Grooming: Set up Where Assessed - Grooming: Supported sitting Upper Body Bathing: Min assistance Where Assessed - Upper Body Bathing: Supported sitting Lower Body Bathing: Maximal assistance Where Assessed - Lower Body Bathing: Supported sitting Upper Body Dressing: Moderate assistance Where Assessed - Upper Body Dressing: Supported sitting Lower Body Dressing: Total assistance Where Assessed - Lower Body Dressing: Supported sitting Transfers/Ambulation Related to ADLs: Pt completed anterior/posterior transfer from bed>recliner with HOB raised using trapeze bar and bed rail with max assist +1. Pt reports at home she was transfering from scooter > standard toilet using grab bars and squat pivot transfers ADL Comments: Pt reports her friend was coming 2x per week to help her sponge bathe and husband and friend assist her in donning/doffing silky dressing gowns which she wears at home    OT Diagnosis: Generalized weakness;Acute pain  OT Problem List: Decreased strength;Decreased range of motion;Decreased activity tolerance;Decreased knowledge of use of DME or AE;Pain OT Treatment Interventions: Self-care/ADL training;Therapeutic exercise;DME and/or AE instruction;Therapeutic activities;Patient/family education   OT Goals(Current goals can be found in the care plan section)    Visit Information  Last OT Received On: 07/21/13 Assistance Needed: +1 History of Present Illness: 68 y/o female that uses a scooter and wheelchair normally had a recent fall injuring left knee. X-rays and CT scan demonstrate a displaced left patella fracture with end stage osteoarthritis in the knee. Pt c/o moderate to severe pain with rom of left lower extremity. Left lower extremity is her dominant side with transfers and ambulation. Risks and benefits of surgery discussed and pt would like  to proceed with surgery so that she begin weight bearing on the left lower extremity without flexion; Now s/p  ORIF L patella; most current order is for NWB LLE       Prior [place] expects to be discharged to:: Private residence Living Arrangements: Spouse/significant other Available Help at Discharge: Family;Available 24 hours/day Type of Home: House Home Access: Ramped entrance Home Layout: One level Home Equipment: Grab bars - toilet;Electric scooter;Wheelchair - manual;Other (comment) Lucianne Lei with w/c lift, short transfer board) Prior Function Level of Independence: Needs assistance ADL's / Homemaking Assistance Needed: assistance from husband and from friend for most ADLs Comments: Pt and husband have developed great ways to manage at home Communication Communication: No difficulties Dominant Hand: Right         Vision/Perception Vision - History Patient Visual Report: No change from baseline   Cognition  Cognition Arousal/Alertness: Awake/alert Behavior During Therapy: WFL for tasks assessed/performed Overall Cognitive Status: Within Functional Limits for tasks assessed    Extremity/Trunk Assessment Upper Extremity Assessment Upper Extremity Assessment: Overall WFL for tasks assessed (MMT of 4/5 for B shoulder Flexion, elbow flex/ext)     Mobility Bed Mobility Overal bed mobility: Needs Assistance Bed Mobility: Supine to Sit Supine to sit: Supervision General bed mobility comments: Verbal cues for sequencing. Able to pull self up with HOB elevated using bed rails Transfers Overall transfer level: Needs assistance Transfers: Anterior-Posterior Transfer Anterior-Posterior transfers: Max A +1 physical assistance using bed pad from bed>recliner. Pt able to use trapeze bars and bed rail to help position hips. Pt able to help more once she could reach arms of recliner.           End of Session OT - End of Session Activity Tolerance: Patient tolerated treatment well Patient left: in chair;with call bell/phone within reach       Juluis Rainier [phone] 07/21/2013, 1:28 PM

## 2013-07-21 NOTE — Progress Notes (Signed)
Pt has refused cpap.  She wears a mouth piece for breathing issues at home.  Pt was aware she could get cpap if she changed her mind.

## 2013-07-22 ENCOUNTER — Encounter (HOSPITAL_COMMUNITY): Payer: Self-pay | Admitting: Orthopedic Surgery

## 2013-07-22 LAB — GLUCOSE, CAPILLARY
GLUCOSE-CAPILLARY: 100 mg/dL — AB (ref 70–99)
Glucose-Capillary: 127 mg/dL — ABNORMAL HIGH (ref 70–99)
Glucose-Capillary: 81 mg/dL (ref 70–99)

## 2013-07-22 MED ORDER — HYDROMORPHONE HCL 2 MG PO TABS: 2.0000 mg | ORAL_TABLET | ORAL | Status: DC | PRN

## 2013-07-22 NOTE — Progress Notes (Signed)
Occupational Therapy Treatment Patient Details Name: [name] MRN: [id number] DOB: 11-May-1946 Today's Date: 07/22/2013 Time: 4174-0814 OT Time Calculation (min): 36 min  OT Assessment / Plan / Recommendation  History of present illness 68 y/o female that uses a scooter and wheelchair normally had a recent fall injuring left knee. X-rays and CT scan demonstrate a displaced left patella fracture with end stage osteoarthritis in the knee. Pt c/o moderate to severe pain with rom of left lower extremity. Left lower extremity is her dominant side with transfers and ambulation. Risks and benefits of surgery discussed and pt would like to proceed with surgery so that she begin weight bearing on the left lower extremity without flexion; Now s/p ORIF L patella; most current order is for NWB LLE   OT comments  Pt participated in transfer training from bed> drop arm BSC using lateral transfer technique this date. Pt completed bed mobility with HOB raised using bed rails and overhead trapeze bar to prepare for the transfer. Pt's husband was present and participated in training, however required VC's for being gentle during transfer and managing pt's LEs. Education and training completed for lateral transfer from w/c to drop arm BSC and pt educated on directing caregivers as appropriate. OT to continue to work on independence with ADLs and lateral transfer training.    Follow Up Recommendations  Home health OT;Supervision/Assistance - 24 hour;Other (comment) (HHAide)       [facility] bed (drop arm BSC, hoyer lift, long sliding board)       Frequency Min 2X/week   Progress towards OT Goals Progress towards OT goals: Progressing toward goals  Plan Discharge plan remains appropriate    Precautions / Restrictions Precautions Precautions: Fall;Other (comment) (gentle AROM of L knee) Restrictions Weight Bearing Restrictions: Yes LLE Weight Bearing: Non weight bearing   Pertinent  Vitals/Pain Pt SPO2 remained at 100 during transfer activity; BP of 114/50 noted when nurse took vitals following transfer.     ADL  Toilet Transfer: Maximal assistance Toilet Transfer Method: Transfer board (lateral transfer from bed> drop arm BSC) Toilet Transfer Equipment: Drop arm bedside commode;Other (comment) (overhead trapeze bar) Transfers/Ambulation Related to ADLs: Pt completed lateral transfer using long sliding transfer board from bed > drop arm BSC with HOB raised. Pt used bed rails and overhead trapeze bar to assist with the tranfer. Pt's husband was present and assisted her with transfer for training once pt is home. VC's needed for pt's husband to be gentle and to manage pt's legs.      OT Goals(current goals can now be found in the care plan section)    Visit Information  Last OT Received On: 07/22/13 Assistance Needed: +1 History of Present Illness: 68 y/o female that uses a scooter and wheelchair normally had a recent fall injuring left knee. X-rays and CT scan demonstrate a displaced left patella fracture with end stage osteoarthritis in the knee. Pt c/o moderate to severe pain with rom of left lower extremity. Left lower extremity is her dominant side with transfers and ambulation. Risks and benefits of surgery discussed and pt would like to proceed with surgery so that she begin weight bearing on the left lower extremity without flexion; Now s/p ORIF L patella; most current order is for NWB LLE          Cognition  Cognition Arousal/Alertness: Awake/alert Behavior During Therapy: WFL for tasks assessed/performed Overall Cognitive Status: Within Functional Limits for tasks assessed    Mobility  Bed Mobility  Overal bed mobility: Needs Assistance Bed Mobility: Supine to Sit Supine to sit: Supervision General bed mobility comments: Verbal cues for sequencing. Able to pull self up with HOB elevated using bed rails Transfers Overall transfer level: Needs  assistance Equipment used:  (long transfer board and bed pad to decrease friction) Transfers: Lateral/Scoot Transfers  Lateral/Scoot Transfers: Max assist;With slide board General transfer comment: Transfer training using lateral technique, long sliding board and drop arm BSC beside bed. Education and training completed for using lateral transfers at home with w/c and drop-arm BSC using transfer board until pt is able to get a hoyer lift.          End of Session OT - End of Session Equipment Utilized During Treatment: Other (comment) (long sliding board, bed pad, drop arm BSC) Activity Tolerance: Patient tolerated treatment well Patient left: in bed;with call bell/phone within reach;with family/visitor present       Juluis Rainier [phone] 07/22/2013, 3:49 PM

## 2013-07-22 NOTE — Progress Notes (Signed)
Patient was provided with discharge instructions and follow up information. She is going home with HHPT/OT/aide and equipment, all provided by [facility]. She will be going home in private vehicle at time of discharge.

## 2013-07-22 NOTE — Progress Notes (Signed)
Physical Therapy Treatment Patient Details Name: [name] MRN: [id number] DOB: 12/13/1945 Today's Date: 07/22/2013 Time: 1145-1200 PT Time Calculation (min): 15 min  PT Assessment / Plan / Recommendation  History of Present Illness 68 y/o female that uses a scooter and wheelchair normally had a recent fall injuring left knee. X-rays and CT scan demonstrate a displaced left patella fracture with end stage osteoarthritis in the knee. Pt c/o moderate to severe pain with rom of left lower extremity. Left lower extremity is her dominant side with transfers and ambulation. Risks and benefits of surgery discussed and pt would like to proceed with surgery so that she begin weight bearing on the left lower extremity without flexion; Now s/p ORIF L patella; most current order is for NWB LLE   PT Comments   Pt tolerated treatment well. Focused on therapeutic exercises for strength and ROM of LLE. Spoke with OT who plans to practice transfer using sliding board this afternoon. Pt safely demonstrated anterior/posterior transfers yesterday with her husband and states she does not have any further concerns on this type of transfer. Pt adequate for discharge with HHPT from physical therapy standpoint. Will check with patient if any concerns arise.   Follow Up Recommendations  Home health PT;Supervision/Assistance - 24 hour     Does the patient have the potential to tolerate intense rehabilitation     Barriers to Discharge        Equipment Recommendations  Other (comment);Hospital bed    Recommendations for Other Services Other (comment)  Frequency Min 3X/week   Progress towards PT Goals Progress towards PT goals: Progressing toward goals  Plan Current plan remains appropriate    Precautions / Restrictions Precautions Precautions: Fall;Other (comment) Restrictions Weight Bearing Restrictions: Yes LLE Weight Bearing: Non weight bearing   Pertinent Vitals/Pain 8/10 pain; states nurse has just  brought medicine prior to therapy arrival Pt repositioned in bed for comfort with LLE elevated and supported for neutral position.    Mobility       Exercises Total Joint Exercises Ankle Circles/Pumps: AAROM;15 reps;Supine;Left Quad Sets: AROM;10 reps;Supine;Left Short Arc Quad: Left;AAROM;10 reps;Supine Heel Slides: AAROM;15 reps;Left;Supine (with hold at end range for ROM) Hip ABduction/ADduction: AROM;10 reps;Left;Supine   PT Diagnosis:    PT Problem List:   PT Treatment Interventions:     PT Goals (current goals can now be found in the care plan section) Acute Rehab PT Goals PT Goal Formulation: With patient Time For Goal Achievement: 08/03/13 Potential to Achieve Goals: Good  Visit Information  Last PT Received On: 07/22/13 Assistance Needed: +1 History of Present Illness: 68 y/o female that uses a scooter and wheelchair normally had a recent fall injuring left knee. X-rays and CT scan demonstrate a displaced left patella fracture with end stage osteoarthritis in the knee. Pt c/o moderate to severe pain with rom of left lower extremity. Left lower extremity is her dominant side with transfers and ambulation. Risks and benefits of surgery discussed and pt would like to proceed with surgery so that she begin weight bearing on the left lower extremity without flexion; Now s/p ORIF L patella; most current order is for NWB LLE    Subjective Data  Subjective: Pt would like to work on exercises for her LLE   Cognition  Cognition Arousal/Alertness: Awake/alert Behavior During Therapy: WFL for tasks assessed/performed Overall Cognitive Status: Within Functional Limits for tasks assessed    Balance     End of Session PT - End of Session Activity Tolerance: Patient tolerated  treatment well Patient left: in bed;with call bell/phone within reach;with family/visitor present;Other (comment) (having lunch)   GP    Elayne Snare, [place]  Ellouise Newer 07/22/2013,  12:10 PM

## 2013-07-22 NOTE — Care Management Note (Signed)
CARE MANAGEMENT NOTE 07/22/2013  Patient:  [name], [name]   Account Number:  [id number]  Date Initiated:  07/20/2013  Documentation initiated by:  Lizabeth Leyden  Subjective/Objective Assessment:   admitted with left patella fracture s/p fall     Action/Plan:   progression of care and discharge planning   Anticipated DC Date:  07/21/2013   Anticipated DC Plan:  [place]  CM consult      [place]  Resumption Of Svcs/PTA Provider   Choice offered to / List presented to:  C-1 Patient   DME arranged  HOSPITAL BED  3-N-1  OTHER - SEE COMMENT      DME agency  [place] arranged  [place]   Status of service:  Completed, signed off Medicare Important Message given?   (If response is "NO", the following Medicare IM given date fields will be blank) Date Medicare IM given:   Date Additional Medicare IM given:    Discharge Disposition:  [place]

## 2013-07-22 NOTE — Progress Notes (Signed)
  Orthopedics Progress Note  Subjective: Doing better on the Dilaudid  Objective:  Filed Vitals:   07/22/13 0538  BP: 123/50  Pulse: 66  Temp: 97.7 F (36.5 C)  Resp: 16    General: Awake and alert  Musculoskeletal: left knee dressing changed, mild erythema present at the incision site. Neurovascularly intact  Lab Results  Component Value Date   WBC 8.9 07/18/2013   HGB 11.5* 07/18/2013   HCT 34.3* 07/18/2013   MCV 90.0 07/18/2013   PLT 391 07/18/2013       Component Value Date/Time   NA 134* 07/17/2013 1600   K 4.4 07/17/2013 1600   CL 96 07/17/2013 1600   CO2 26 07/17/2013 1600   GLUCOSE 103* 07/17/2013 1600   BUN 7 07/17/2013 1600   CREATININE 0.45* 07/18/2013 1630   CALCIUM 9.5 07/17/2013 1600   GFRNONAA >90 07/18/2013 1630   GFRAA >90 07/18/2013 1630    Lab Results  Component Value Date   INR 1.17 06/08/2010   INR 0.90 01/24/2010    Assessment/Plan: POD #3 s/p Procedure(s): OPEN REDUCTION INTERNAL (ORIF) LEFT FIXATION PATELLA D/C to home, ok to range knee as tolerated, prefer NWB,  If patient has to WB, then must have the knee immobilizer on tightly  Doran Heater. Veverly Fells, MD 07/22/2013 11:59 AM

## 2013-07-23 NOTE — Discharge Summary (Signed)
Physician Discharge Summary   Patient ID: [name] MRN: [id number] DOB/AGE: Apr 14, 1946 68 y.o.  Admit date: 07/18/2013 Discharge date: 07/23/2013  Admission Diagnoses:  Active Problems:   Left patella fracture   Post-polio syndrome              Discharge Diagnoses:  Same   Surgeries: Procedure(s): OPEN REDUCTION INTERNAL (ORIF) LEFT FIXATION PATELLA on 07/18/2013   Consultants: PT,OT  Discharged Condition: Stable  Hospital Course: [name] is an 68 y.o. female who was admitted 07/18/2013 with a chief complaint of left knee pain, and found to have a diagnosis of left displaced patella fracture.  They were brought to the operating room on 07/18/2013 and underwent the above named procedures.    The patient had an uncomplicated hospital course and was stable for discharge.  Recent vital signs:  Filed Vitals:   07/22/13 1336  BP: 114/50  Pulse: 65  Temp: 97.6 F (36.4 C)  Resp: 18    Recent laboratory studies:  Results for orders placed during the hospital encounter of 07/18/13  GLUCOSE, CAPILLARY      Result Value Ref Range   Glucose-Capillary 117 (*) 70 - 99 mg/dL  GLUCOSE, CAPILLARY      Result Value Ref Range   Glucose-Capillary 118 (*) 70 - 99 mg/dL   Comment 1 Documented in Chart     Comment 2 Notify RN    CBC      Result Value Ref Range   WBC 8.9  4.0 - 10.5 K/uL   RBC 3.81 (*) 3.87 - 5.11 MIL/uL   Hemoglobin 11.5 (*) 12.0 - 15.0 g/dL   HCT 34.3 (*) 36.0 - 46.0 %   MCV 90.0  78.0 - 100.0 fL   MCH 30.2  26.0 - 34.0 pg   MCHC 33.5  30.0 - 36.0 g/dL   RDW 14.3  11.5 - 15.5 %   Platelets 391  150 - 400 K/uL  CREATININE, SERUM      Result Value Ref Range   Creatinine, Ser 0.45 (*) 0.50 - 1.10 mg/dL   GFR calc non Af Amer >90  >90 mL/min   GFR calc Af Amer >90  >90 mL/min  GLUCOSE, CAPILLARY      Result Value Ref Range   Glucose-Capillary 133 (*) 70 - 99 mg/dL  GLUCOSE, CAPILLARY      Result Value Ref Range   Glucose-Capillary 115 (*) 70 - 99 mg/dL    GLUCOSE, CAPILLARY      Result Value Ref Range   Glucose-Capillary 138 (*) 70 - 99 mg/dL  GLUCOSE, CAPILLARY      Result Value Ref Range   Glucose-Capillary 108 (*) 70 - 99 mg/dL  GLUCOSE, CAPILLARY      Result Value Ref Range   Glucose-Capillary 130 (*) 70 - 99 mg/dL  GLUCOSE, CAPILLARY      Result Value Ref Range   Glucose-Capillary 91  70 - 99 mg/dL  GLUCOSE, CAPILLARY      Result Value Ref Range   Glucose-Capillary 114 (*) 70 - 99 mg/dL  GLUCOSE, CAPILLARY      Result Value Ref Range   Glucose-Capillary 145 (*) 70 - 99 mg/dL  GLUCOSE, CAPILLARY      Result Value Ref Range   Glucose-Capillary 115 (*) 70 - 99 mg/dL  GLUCOSE, CAPILLARY      Result Value Ref Range   Glucose-Capillary 91  70 - 99 mg/dL  GLUCOSE, CAPILLARY      Result Value Ref Range  Glucose-Capillary 111 (*) 70 - 99 mg/dL   Comment 1 Documented in Chart     Comment 2 Notify RN    GLUCOSE, CAPILLARY      Result Value Ref Range   Glucose-Capillary 80  70 - 99 mg/dL   Comment 1 Notify RN     Comment 2 Documented in Chart    GLUCOSE, CAPILLARY      Result Value Ref Range   Glucose-Capillary 67 (*) 70 - 99 mg/dL   Comment 1 Notify RN     Comment 2 Documented in Chart    GLUCOSE, CAPILLARY      Result Value Ref Range   Glucose-Capillary 138 (*) 70 - 99 mg/dL   Comment 1 Notify RN     Comment 2 Documented in Chart    GLUCOSE, CAPILLARY      Result Value Ref Range   Glucose-Capillary 103 (*) 70 - 99 mg/dL  GLUCOSE, CAPILLARY      Result Value Ref Range   Glucose-Capillary 127 (*) 70 - 99 mg/dL  GLUCOSE, CAPILLARY      Result Value Ref Range   Glucose-Capillary 81  70 - 99 mg/dL  GLUCOSE, CAPILLARY      Result Value Ref Range   Glucose-Capillary 100 (*) 70 - 99 mg/dL    Discharge Medications:     Medication List         albuterol 108 (90 BASE) MCG/ACT inhaler  Commonly known as:  PROVENTIL HFA;VENTOLIN HFA  Inhale 1-2 puffs into the lungs every 6 (six) hours as needed for wheezing or  shortness of breath.     aspirin 81 MG tablet  Take 81 mg by mouth at bedtime.     benzonatate 100 MG capsule  Commonly known as:  TESSALON  Take 100 mg by mouth 3 (three) times daily as needed for cough.     CALTRATE 600+D 600-400 MG-UNIT per chew tablet  Generic drug:  Calcium Carbonate-Vitamin D  Chew 1 tablet by mouth 2 (two) times daily.     chlorthalidone 25 MG tablet  Commonly known as:  HYGROTON  Take 12.5 mg by mouth daily.     citalopram 40 MG tablet  Commonly known as:  CELEXA  Take 40 mg by mouth daily.     clonazePAM 0.5 MG tablet  Commonly known as:  KLONOPIN  Take 0.5 mg by mouth 2 (two) times daily.     DEXILANT 60 MG capsule  Generic drug:  dexlansoprazole  Take 60 mg by mouth at bedtime.     ergocalciferol 50000 UNITS capsule  Commonly known as:  VITAMIN D2  Take 50,000 Units by mouth every 30 (thirty) days.     fish oil-omega-3 fatty acids 1000 MG capsule  Take 2 g by mouth 2 (two) times daily.     furosemide 20 MG tablet  Commonly known as:  LASIX  Take 20 mg by mouth daily.     HYDROmorphone 2 MG tablet  Commonly known as:  DILAUDID  Take 1 tablet (2 mg total) by mouth every 4 (four) hours as needed for severe pain.     hydrOXYzine 25 MG tablet  Commonly known as:  ATARAX/VISTARIL  Take 25 mg by mouth 3 (three) times daily as needed for itching.     levothyroxine 100 MCG tablet  Commonly known as:  SYNTHROID, LEVOTHROID  Take 100 mcg by mouth daily before breakfast.     losartan 100 MG tablet  Commonly known as:  COZAAR  Take 100 mg by mouth daily.     metFORMIN 500 MG tablet  Commonly known as:  GLUCOPHAGE  Take 1,000 mg by mouth 2 (two) times daily with a meal.     metoprolol succinate 25 MG 24 hr tablet  Commonly known as:  TOPROL-XL  Take 12.5 mg by mouth daily.     multivitamin with minerals tablet  Take 1 tablet by mouth daily.     nitroGLYCERIN 0.4 MG SL tablet  Commonly known as:  NITROSTAT  Place 0.4 mg under the  tongue every 5 (five) minutes as needed for chest pain.     oxyCODONE 5 MG immediate release tablet  Commonly known as:  Oxy IR/ROXICODONE  Take 1 tablet (5 mg total) by mouth every 4 (four) hours as needed for severe pain.     potassium chloride SA 20 MEQ tablet  Commonly known as:  K-DUR,KLOR-CON  Take 20 mEq by mouth 2 (two) times daily.     simvastatin 40 MG tablet  Commonly known as:  ZOCOR  Take 20 mg by mouth at bedtime.     tiZANidine 4 MG tablet  Commonly known as:  ZANAFLEX  Take 4 mg by mouth 5 (five) times daily as needed for muscle spasms. PAIN     TOVIAZ 4 MG Tb24 tablet  Generic drug:  fesoterodine  Take 4 mg by mouth daily.     traMADol 50 MG tablet  Commonly known as:  ULTRAM  Take 50 mg by mouth every 6 (six) hours as needed for pain. For pain.  Takes Ultram not Tramadol, dut to aAcetaminophen allergy.     VITAMIN B 12 PO  Take 500 mcg by mouth daily.     zolpidem 10 MG tablet  Commonly known as:  AMBIEN  Take 10 mg by mouth at bedtime.        Diagnostic Studies: Dg Lumbar Spine Complete  07/01/2013   CLINICAL DATA:  Low back pain  EXAM: LUMBAR SPINE - COMPLETE 4+ VIEW  COMPARISON:  None.  FINDINGS: Five lumbar type vertebral bodies are well visualized. A scoliosis concave to the right is noted at the L2 level. Compensatory osteophytic changes are seen. No pars defects are noted. No spondylolisthesis is noted.  IMPRESSION: Scoliosis with associated degenerative change. No other focal abnormality is seen.   Electronically Signed   By: Inez Catalina M.D.   On: 07/01/2013 14:12   Dg Hip Bilateral W/pelvis  07/01/2013   CLINICAL DATA:  Left hip pain  EXAM: BILATERAL HIP WITH PELVIS - 4+ VIEW  COMPARISON:  None.  FINDINGS: No acute fracture or dislocation is identified. Mild degenerative changes are noted. No gross soft tissue abnormality is noted.  IMPRESSION: No acute abnormality noted.   Electronically Signed   By: Inez Catalina M.D.   On: 07/01/2013 14:06   Dg  Knee 1-2 Views Left  07/18/2013   CLINICAL DATA:  Internal fixation of the left patella. Left patella fracture.  EXAM: LEFT KNEE - 1-2 VIEW; DG C-ARM 1-60 MIN  COMPARISON:  06/30/2013  FINDINGS: Two intraoperative images demonstrate two compression screws extending through the patella in a craniocaudal orientation. In addition, there is a cerclage wire within the patella. Again noted is chronic deformity of the distal femur with probable heterotopic ossifications.  IMPRESSION: Internal fixation of the patella fracture.   Electronically Signed   By: Markus Daft M.D.   On: 07/18/2013 12:04   Ct Head Wo Contrast  06/30/2013   CLINICAL  DATA:  Headache, dizziness  EXAM: CT HEAD WITHOUT CONTRAST  TECHNIQUE: Contiguous axial images were obtained from the base of the skull through the vertex without intravenous contrast.  COMPARISON:  06/04/2010  FINDINGS: No skull fracture is noted. Paranasal sinuses and mastoid air cells are unremarkable. No intracranial hemorrhage, mass effect or midline shift. Mild cerebral atrophy. Mild periventricular white matter decreased attenuation probable due to chronic small vessel ischemic changes. No acute cortical infarction. No mass lesion is noted on this unenhanced scan.  IMPRESSION: No acute intracranial abnormality.  Mild cerebral atrophy.   Electronically Signed   By: Lahoma Crocker M.D.   On: 06/30/2013 15:30   Ct Knee Left Wo Contrast  07/09/2013   CLINICAL DATA:  Injured knee.  Persistent pain.  EXAM: CT OF THE LEFT KNEE WITHOUT CONTRAST; 3-DIMENSIONAL CT IMAGE RENDERING ON INDEPENDENT WORKSTATION  TECHNIQUE: Multidetector CT imaging was performed according to the standard protocol. Multiplanar CT image reconstructions were also generated.; 3-dimensional CT images were rendered by post-processing of the original CT data on an independent workstation. The 3-dimensional CT images were interpreted and findings were reported in the accompanying complete CT report for this study   COMPARISON:  Radiographs 06/30/2013  FINDINGS: There is a patellar fracture. This involves the mid patella with separation of the fragments a maximum of 9 mm. There is associated soft tissue swelling and moderate-sized joint effusion.  Severe tricompartmental degenerative changes with marked joint space narrowing, osteophytic spurring, subchondral cystic change and bony eburnation. There are large fragmented osteophytes and loose bodies in the joint. The largest is noted along the posterior aspect of the medial femoral condyle and it measures 2.4 cm.  Calcification along the superior aspect of the lateral collateral ligament likely due to an Prior trauma and dystrophic calcification. No acute fracture of the femur or tibia is identified.  IMPRESSION: Displaced mid patellar fracture.  Severe tricompartmental degenerative changes with numerous small and large loose ossified bodies in the joint.  Moderate to large joint effusion.   Electronically Signed   By: Kalman Jewels M.D.   On: 07/09/2013 18:00   Ct 3d Independent Wkst  07/09/2013   CLINICAL DATA:  Injured knee.  Persistent pain.  EXAM: CT OF THE LEFT KNEE WITHOUT CONTRAST; 3-DIMENSIONAL CT IMAGE RENDERING ON INDEPENDENT WORKSTATION  TECHNIQUE: Multidetector CT imaging was performed according to the standard protocol. Multiplanar CT image reconstructions were also generated.; 3-dimensional CT images were rendered by post-processing of the original CT data on an independent workstation. The 3-dimensional CT images were interpreted and findings were reported in the accompanying complete CT report for this study  COMPARISON:  Radiographs 06/30/2013  FINDINGS: There is a patellar fracture. This involves the mid patella with separation of the fragments a maximum of 9 mm. There is associated soft tissue swelling and moderate-sized joint effusion.  Severe tricompartmental degenerative changes with marked joint space narrowing, osteophytic spurring, subchondral  cystic change and bony eburnation. There are large fragmented osteophytes and loose bodies in the joint. The largest is noted along the posterior aspect of the medial femoral condyle and it measures 2.4 cm.  Calcification along the superior aspect of the lateral collateral ligament likely due to an Prior trauma and dystrophic calcification. No acute fracture of the femur or tibia is identified.  IMPRESSION: Displaced mid patellar fracture.  Severe tricompartmental degenerative changes with numerous small and large loose ossified bodies in the joint.  Moderate to large joint effusion.   Electronically Signed   By: Elta Guadeloupe  Gallerani M.D.   On: 07/09/2013 18:00   Dg Knee Complete 4 Views Left  06/30/2013   CLINICAL DATA:  Recent traumatic injury with knee pain  EXAM: LEFT KNEE - COMPLETE 4+ VIEW  COMPARISON:  None.  FINDINGS: Severe degenerative changes of the knee are noted particularly in the medial joint space. Significant remodeling is noted. Considerable soft tissue swelling is noted related to the recent injury. There is also fragmentation of the patella which appears to be chronic in nature given the fairly well corticated margins.  IMPRESSION: Soft tissue injury as well as significant chronic bony abnormality. No definitive acute fracture is seen.   Electronically Signed   By: Inez Catalina M.D.   On: 06/30/2013 15:36   Dg C-arm 1-60 Min  07/18/2013   CLINICAL DATA:  Internal fixation of the left patella. Left patella fracture.  EXAM: LEFT KNEE - 1-2 VIEW; DG C-ARM 1-60 MIN  COMPARISON:  06/30/2013  FINDINGS: Two intraoperative images demonstrate two compression screws extending through the patella in a craniocaudal orientation. In addition, there is a cerclage wire within the patella. Again noted is chronic deformity of the distal femur with probable heterotopic ossifications.  IMPRESSION: Internal fixation of the patella fracture.   Electronically Signed   By: Markus Daft M.D.   On: 07/18/2013 12:04     Disposition: 01-Home or Self Care        Follow-up Information   Follow up with Latrica Clowers,STEVEN R, MD. Call in 2 weeks. [phone])    Specialty:  Orthopedic Surgery   Contact information:   [address] [place] [address] [phone]        Signed: Augustin Schooling 07/23/2013, 7:38 AM

## 2013-09-01 ENCOUNTER — Emergency Department (HOSPITAL_COMMUNITY): Payer: Medicare Other

## 2013-09-01 ENCOUNTER — Encounter (HOSPITAL_COMMUNITY): Payer: Self-pay | Admitting: Emergency Medicine

## 2013-09-01 ENCOUNTER — Emergency Department (HOSPITAL_COMMUNITY)
Admission: EM | Admit: 2013-09-01 | Discharge: 2013-09-02 | Disposition: A | Discharge status: Home / Self Care | Payer: Medicare Other | Attending: Emergency Medicine | Admitting: Emergency Medicine

## 2013-09-01 DIAGNOSIS — E119 Type 2 diabetes mellitus without complications: Secondary | ICD-10-CM | POA: Insufficient documentation

## 2013-09-01 DIAGNOSIS — I10 Essential (primary) hypertension: Secondary | ICD-10-CM | POA: Insufficient documentation

## 2013-09-01 DIAGNOSIS — Z87442 Personal history of urinary calculi: Secondary | ICD-10-CM | POA: Insufficient documentation

## 2013-09-01 DIAGNOSIS — I509 Heart failure, unspecified: Secondary | ICD-10-CM | POA: Insufficient documentation

## 2013-09-01 DIAGNOSIS — F411 Generalized anxiety disorder: Secondary | ICD-10-CM | POA: Insufficient documentation

## 2013-09-01 DIAGNOSIS — M1710 Unilateral primary osteoarthritis, unspecified knee: Secondary | ICD-10-CM | POA: Insufficient documentation

## 2013-09-01 DIAGNOSIS — Z79899 Other long term (current) drug therapy: Secondary | ICD-10-CM | POA: Insufficient documentation

## 2013-09-01 DIAGNOSIS — E785 Hyperlipidemia, unspecified: Secondary | ICD-10-CM | POA: Insufficient documentation

## 2013-09-01 DIAGNOSIS — G4700 Insomnia, unspecified: Secondary | ICD-10-CM | POA: Insufficient documentation

## 2013-09-01 DIAGNOSIS — M25562 Pain in left knee: Secondary | ICD-10-CM

## 2013-09-01 DIAGNOSIS — Z8781 Personal history of (healed) traumatic fracture: Secondary | ICD-10-CM | POA: Insufficient documentation

## 2013-09-01 DIAGNOSIS — K219 Gastro-esophageal reflux disease without esophagitis: Secondary | ICD-10-CM | POA: Insufficient documentation

## 2013-09-01 DIAGNOSIS — R509 Fever, unspecified: Secondary | ICD-10-CM | POA: Insufficient documentation

## 2013-09-01 DIAGNOSIS — Z9889 Other specified postprocedural states: Secondary | ICD-10-CM | POA: Insufficient documentation

## 2013-09-01 DIAGNOSIS — A809 Acute poliomyelitis, unspecified: Secondary | ICD-10-CM | POA: Insufficient documentation

## 2013-09-01 DIAGNOSIS — Z7982 Long term (current) use of aspirin: Secondary | ICD-10-CM | POA: Insufficient documentation

## 2013-09-01 DIAGNOSIS — M949 Disorder of cartilage, unspecified: Secondary | ICD-10-CM

## 2013-09-01 DIAGNOSIS — IMO0002 Reserved for concepts with insufficient information to code with codable children: Secondary | ICD-10-CM | POA: Insufficient documentation

## 2013-09-01 DIAGNOSIS — G8929 Other chronic pain: Secondary | ICD-10-CM | POA: Insufficient documentation

## 2013-09-01 DIAGNOSIS — M129 Arthropathy, unspecified: Secondary | ICD-10-CM | POA: Insufficient documentation

## 2013-09-01 DIAGNOSIS — R3911 Hesitancy of micturition: Secondary | ICD-10-CM | POA: Insufficient documentation

## 2013-09-01 DIAGNOSIS — Z880 Allergy status to penicillin: Secondary | ICD-10-CM | POA: Insufficient documentation

## 2013-09-01 DIAGNOSIS — E039 Hypothyroidism, unspecified: Secondary | ICD-10-CM | POA: Insufficient documentation

## 2013-09-01 DIAGNOSIS — M899 Disorder of bone, unspecified: Secondary | ICD-10-CM | POA: Insufficient documentation

## 2013-09-01 LAB — BASIC METABOLIC PANEL
BUN: 10 mg/dL (ref 6–23)
CO2: 28 mEq/L (ref 19–32)
Calcium: 9.7 mg/dL (ref 8.4–10.5)
Chloride: 92 mEq/L — ABNORMAL LOW (ref 96–112)
Creatinine, Ser: 0.52 mg/dL (ref 0.50–1.10)
Glucose, Bld: 109 mg/dL — ABNORMAL HIGH (ref 70–99)
POTASSIUM: 3.3 meq/L — AB (ref 3.7–5.3)
Sodium: 133 mEq/L — ABNORMAL LOW (ref 137–147)

## 2013-09-01 LAB — URINALYSIS, ROUTINE W REFLEX MICROSCOPIC
Bilirubin Urine: NEGATIVE
Glucose, UA: NEGATIVE mg/dL
Hgb urine dipstick: NEGATIVE
Ketones, ur: NEGATIVE mg/dL
LEUKOCYTES UA: NEGATIVE
NITRITE: NEGATIVE
Protein, ur: NEGATIVE mg/dL
SPECIFIC GRAVITY, URINE: 1.007 (ref 1.005–1.030)
Urobilinogen, UA: 0.2 mg/dL (ref 0.0–1.0)
pH: 6 (ref 5.0–8.0)

## 2013-09-01 LAB — CBC
HCT: 36 % (ref 36.0–46.0)
Hemoglobin: 12.5 g/dL (ref 12.0–15.0)
MCH: 28.9 pg (ref 26.0–34.0)
MCHC: 34.7 g/dL (ref 30.0–36.0)
MCV: 83.3 fL (ref 78.0–100.0)
Platelets: 383 10*3/uL (ref 150–400)
RBC: 4.32 MIL/uL (ref 3.87–5.11)
RDW: 13.5 % (ref 11.5–15.5)
WBC: 9.3 10*3/uL (ref 4.0–10.5)

## 2013-09-01 MED ORDER — FENTANYL CITRATE 0.05 MG/ML IJ SOLN
50.0000 ug | Freq: Once | INTRAMUSCULAR | Status: AC
  Administered 2013-09-01: 50 ug via INTRAVENOUS
  Filled 2013-09-01: qty 2

## 2013-09-01 MED ORDER — FENTANYL CITRATE 0.05 MG/ML IJ SOLN
50.0000 ug | INTRAMUSCULAR | Status: DC | PRN
  Administered 2013-09-01: 50 ug via INTRAMUSCULAR
  Filled 2013-09-01: qty 2

## 2013-09-01 NOTE — ED Provider Notes (Signed)
CSN: [id number]     Arrival date & time 09/01/13  1757 History   First MD Initiated Contact with Patient 09/01/13 1959     Chief Complaint  Patient presents with  . Knee Pain     (Consider location/radiation/quality/duration/timing/severity/associated sxs/prior Treatment) Patient is a 68 y.o. female presenting with knee pain. The history is provided by the patient and medical records. No language interpreter was used.  Knee Pain Associated symptoms: fever (subjective)   Associated symptoms: no back pain and no neck pain     [name] is a 68 y.o. female  with a hx of polio, chronic back pain, hypertension, CHF, anxiety, asthma presents to the Emergency Department complaining of gradual, persistent, progressively worsening left knee pain, swelling and erythema onset 2 days prior.  Patient reports subjective fevers at home with chills. She reports she had a fall in mid February causing a fractured patella. Patient had surgery by Dr. Veverly Fells on March 6 to repair the patella. She reports healing without difficulty until 2 days ago.  Patient reports she is wheelchair-bound due to her polio.  Patient reports taking Neurontin at home which helped the pain but she is now out of that. She tried no other treatments. No aggravating factors. Patient denies headache neck pain, chest pain, shortness of breath, abdominal pain, nausea, vomiting, diarrhea, weakness, dizziness, syncope, dysuria and hematuria. She does state she is concerned about a possible UTI and she's had increased hesitancy urinating but denies dysuria, urgency, urgency or hematuria.    Past Medical History  Diagnosis Date  . Post-polio syndrome   . Chronic back pain   . Broken heart syndrome   . Hypertension   . CHF (congestive heart failure)   . Hypothyroidism   . Mental disorder   . Anxiety   . Asthma   . GERD (gastroesophageal reflux disease)   . Insomnia   . Hyperlipemia   . Osteopenia   . Family history of anesthesia  complication     Mother and Sister- N/V  . Kidney stones   . Sleep apnea   . Type II diabetes mellitus   . H/O hiatal hernia   . TIWPYKDX(833.8)     "weekly" (07/18/2013)  . Migraine     "years ago" (07/18/2013)  . Arthritis     "hands, knees, back" (07/18/2013)  . Chronic lower back pain    Past Surgical History  Procedure Laterality Date  . Cholecystectomy    . Abdominal hysterectomy    . Knee surgery Left   . Carpal tunnel release Bilateral   . Tubal ligation    . Cystoscopy    . Colonoscopy    . Leg surgery Left     polio  . Orif patella fracture Left 07/18/2013  . Appendectomy    . Knee arthroscopy Left   . Wrist tendon transfer Right   . Wisdom tooth extraction    . Orif patella Left 07/18/2013    Procedure: OPEN REDUCTION INTERNAL (ORIF) LEFT FIXATION PATELLA;  Surgeon: Augustin Schooling, MD;  Location: [facility];  Service: Orthopedics;  Laterality: Left;   Family History  Problem Relation Age of Onset  . Heart failure Mother   . Diabetes Father   . Cancer Father   . Heart failure Father    History  Substance Use Topics  . Smoking status: Never Smoker   . Smokeless tobacco: Never Used  . Alcohol Use: No   OB History   Grav Para Term Preterm Abortions TAB  SAB Ect Mult Living                 Review of Systems  Constitutional: Positive for fever (subjective). Negative for chills.  Respiratory: Negative for cough and chest tightness.   Cardiovascular: Negative for chest pain.  Gastrointestinal: Negative for nausea, vomiting and abdominal pain.  Endocrine: Negative for polydipsia, polyphagia and polyuria.  Genitourinary: Negative for dysuria, urgency, frequency, hematuria and decreased urine volume.       Urinary hesitancy  Musculoskeletal: Positive for arthralgias, gait problem ( pt nonambulatory at baseline) and joint swelling. Negative for back pain, neck pain and neck stiffness.  Skin: Negative for wound.  Allergic/Immunologic: Negative for immunocompromised state.   Neurological: Negative for numbness.  Hematological: Does not bruise/bleed easily.  Psychiatric/Behavioral: The patient is not nervous/anxious.   All other systems reviewed and are negative.     Allergies  Ciprofloxacin; Acetaminophen; Aspirin; Nsaids; Penicillins; Percocet; Propoxyphene n-acetaminophen; Soma compound; Sulfa antibiotics; Tolectin; and Macrodantin  Home Medications   Prior to Admission medications   Medication Sig Start Date End Date Taking? Authorizing Provider  albuterol (PROVENTIL HFA;VENTOLIN HFA) 108 (90 BASE) MCG/ACT inhaler Inhale 1-2 puffs into the lungs every 6 (six) hours as needed for wheezing or shortness of breath.   Yes Historical Provider, MD  aspirin 81 MG tablet Take 81 mg by mouth at bedtime.    Yes Historical Provider, MD  benzonatate (TESSALON) 100 MG capsule Take 100 mg by mouth 3 (three) times daily as needed for cough.   Yes Historical Provider, MD  Calcium Carbonate-Vitamin D (CALTRATE 600+D) 600-400 MG-UNIT per chew tablet Chew 1 tablet by mouth 2 (two) times daily.    Yes Historical Provider, MD  chlorthalidone (HYGROTON) 25 MG tablet Take 12.5 mg by mouth daily.  06/03/13  Yes Historical Provider, MD  citalopram (CELEXA) 40 MG tablet Take 40 mg by mouth daily.     Yes Historical Provider, MD  clonazePAM (KLONOPIN) 0.5 MG tablet Take 0.5 mg by mouth 2 (two) times daily.    Yes Historical Provider, MD  Cyanocobalamin (VITAMIN B 12 PO) Take 500 mcg by mouth daily.   Yes Historical Provider, MD  dexlansoprazole (DEXILANT) 60 MG capsule Take 60 mg by mouth at bedtime.    Yes Historical Provider, MD  ergocalciferol (VITAMIN D2) 50000 UNITS capsule Take 50,000 Units by mouth every 30 (thirty) days.   Yes Historical Provider, MD  fish oil-omega-3 fatty acids 1000 MG capsule Take 2 g by mouth 2 (two) times daily.     Yes Historical Provider, MD  furosemide (LASIX) 20 MG tablet Take 10 mg by mouth 3 (three) times a week.    Yes Historical Provider, MD   hydrOXYzine (ATARAX/VISTARIL) 25 MG tablet Take 25 mg by mouth 3 (three) times daily as needed for itching.   Yes Historical Provider, MD  levothyroxine (SYNTHROID, LEVOTHROID) 100 MCG tablet Take 100 mcg by mouth daily before breakfast.   Yes Historical Provider, MD  losartan (COZAAR) 100 MG tablet Take 100 mg by mouth daily.  06/24/13  Yes Historical Provider, MD  metFORMIN (GLUCOPHAGE) 500 MG tablet Take 1,000 mg by mouth 2 (two) times daily with a meal.    Yes Historical Provider, MD  metoprolol succinate (TOPROL-XL) 25 MG 24 hr tablet Take 12.5 mg by mouth daily.   Yes Historical Provider, MD  Multiple Vitamins-Minerals (MULTIVITAMIN WITH MINERALS) tablet Take 1 tablet by mouth daily.     Yes Historical Provider, MD  nitroGLYCERIN (NITROSTAT) 0.4 MG  SL tablet Place 0.4 mg under the tongue every 5 (five) minutes as needed for chest pain.   Yes Historical Provider, MD  potassium chloride SA (K-DUR,KLOR-CON) 20 MEQ tablet Take 20 mEq by mouth 2 (two) times daily.  06/11/13  Yes Historical Provider, MD  simvastatin (ZOCOR) 40 MG tablet Take 20 mg by mouth at bedtime.  10/03/10  Yes Minus Breeding, MD  tiZANidine (ZANAFLEX) 4 MG tablet Take 4 mg by mouth 5 (five) times daily as needed for muscle spasms. PAIN   Yes Historical Provider, MD  TOVIAZ 4 MG TB24 tablet Take 4 mg by mouth daily.  05/22/13  Yes Historical Provider, MD  traMADol (ULTRAM) 50 MG tablet Take 50 mg by mouth every 6 (six) hours as needed for pain. For pain.  Takes Ultram not Tramadol, dut to aAcetaminophen allergy.   Yes Historical Provider, MD  zolpidem (AMBIEN) 10 MG tablet Take 10 mg by mouth at bedtime.    Yes Historical Provider, MD   BP 137/43  Pulse 66  Temp(Src) 98.2 F (36.8 C) (Oral)  Resp 16  Ht 5' (1.524 m)  Wt 162 lb (73.483 kg)  BMI 31.64 kg/m2  SpO2 94% Physical Exam  Nursing note and vitals reviewed. Constitutional: She is oriented to person, place, and time. She appears well-developed and well-nourished. No  distress.  Awake, alert, nontoxic appearance  HENT:  Head: Normocephalic and atraumatic.  Mouth/Throat: Oropharynx is clear and moist. No oropharyngeal exudate.  Eyes: Conjunctivae are normal. No scleral icterus.  Neck: Normal range of motion. Neck supple.  Cardiovascular: Normal rate, regular rhythm, normal heart sounds and intact distal pulses.   No murmur heard. Capillary refill < 3 sec RRR  Pulmonary/Chest: Effort normal and breath sounds normal. No respiratory distress. She has no wheezes.  Clear and equal breath sounds  Abdominal: Soft. Bowel sounds are normal. She exhibits no mass. There is no tenderness. There is no rebound and no guarding.  Soft and nontender  Musculoskeletal: Normal range of motion. She exhibits tenderness. She exhibits no edema.  ROM: Decreased passive ROM of the left knee due to pain; pt without AROM at baseline Swelling and mild erythema to the left knee; well healed surgical scars  Neurological: She is alert and oriented to person, place, and time. Coordination normal.  Sensation intact Strength 0/5 at baseline  Skin: Skin is warm and dry. She is not diaphoretic. There is erythema.  No tenting of the skin Mild erythema and increased warmth to the L knee without induration or palpable abscess  Psychiatric: She has a normal mood and affect.    ED Course  Procedures (including critical care time) Labs Review Labs Reviewed  BASIC METABOLIC PANEL - Abnormal; Notable for the following:    Sodium 133 (*)    Potassium 3.3 (*)    Chloride 92 (*)    Glucose, Bld 109 (*)    All other components within normal limits  URINALYSIS, ROUTINE W REFLEX MICROSCOPIC - Abnormal; Notable for the following:    APPearance CLOUDY (*)    All other components within normal limits  CBC    Imaging Review Dg Knee Complete 4 Views Left  09/01/2013   CLINICAL DATA:  History of polio, left knee pain  EXAM: LEFT KNEE - COMPLETE 4+ VIEW  COMPARISON:  CT knee 07/09/2013;  intraoperative radiographs 07/18/2013  FINDINGS: Surgical changes of ORIF of patellar fracture without interval change in appearance compared to the prior radiographs. Advanced secondary degenerative osteoarthritis noted. Moderate suprapatellar  knee joint effusion. No definite new acute fracture or malalignment.  IMPRESSION: 1. No definite new acute fracture or malalignment. 2. Moderate suprapatellar knee joint effusion. 3. Surgical changes of patellar fracture ORIF without significant interval change compared the intraoperative radiographs from 07/18/2013.   Electronically Signed   By: Jacqulynn Cadet M.D.   On: 09/01/2013 21:28     EKG Interpretation None      MDM   Final diagnoses:  Arthralgia of left knee    [name] presents with left knee pain, swelling and erythema approximately 5 weeks post surgery for patellar repair. Patient endorses subjective fever however she is afebrile and not tachycardic here in the emergency department. She also endorses mild urinary hesitancy he has a soft nontender abdomen here in the emergency department. We'll obtain urine sample, basic labs and x-ray of left knee. Patient denies new trauma.    10:30PM Patient without leukocytosis.  No evidence of UTI. Discussed with Dr. Spero Curb who recommends orthopedic evaluation. We'll consult  12:02 AM Pt evaluated by Dr. Onnie Graham of [facility] ortho.  He believes this is a reactive arthritis. He recommends pain control at home. Patient will followup with Dr. Alma Friendly in 2 days for further evaluation.  Dr. Onnie Graham requests that the patient be given dilaudid tablets for pain.  I will write for 2 days of pain control and Dr. Veverly Fells may continue this Rx if he so chooses.  Pt has remained hemodynamically stable here in the emergency department. She remained afebrile and not tachycardic.  It has been determined that no acute conditions requiring further emergency intervention are present at this time. The patient/guardian  have been advised of the diagnosis and plan. We have discussed signs and symptoms that warrant return to the ED, such as changes or worsening in symptoms.   Vital signs are stable at discharge.   BP 137/43  Pulse 66  Temp(Src) 98.2 F (36.8 C) (Oral)  Resp 16  Ht 5' (1.524 m)  Wt 162 lb (73.483 kg)  BMI 31.64 kg/m2  SpO2 94%  Patient/guardian has voiced understanding and agreed to follow-up with the PCP or specialist.  The patient was discussed with and seen by Dr. Doy Mince who agrees with the treatment plan.       Jarrett Soho Somaya Grassi, PA-C 09/02/13 0015

## 2013-09-01 NOTE — Consult Note (Signed)
Reason for Consult: Left knee pain Referring Physician: EDP  HPI: [name] is an 68 y.o. female with post polio syndrome, non Ambulator, s/p multiple previous surgeries to left knee, recent fall with displaced left patella fracture, s/p ORIF by Dr. Veverly Fells 3/6, presents to ER this evening with c/o increased left knee pain and subjective "chills". Has appointment to see Veverly Fells this Thursday.  Past Medical History  Diagnosis Date  . Post-polio syndrome   . Chronic back pain   . Broken heart syndrome   . Hypertension   . CHF (congestive heart failure)   . Hypothyroidism   . Mental disorder   . Anxiety   . Asthma   . GERD (gastroesophageal reflux disease)   . Insomnia   . Hyperlipemia   . Osteopenia   . Family history of anesthesia complication     Mother and Sister- N/V  . Kidney stones   . Sleep apnea   . Type II diabetes mellitus   . H/O hiatal hernia   . QIWLNLGX(211.9)     "weekly" (07/18/2013)  . Migraine     "years ago" (07/18/2013)  . Arthritis     "hands, knees, back" (07/18/2013)  . Chronic lower back pain     Past Surgical History  Procedure Laterality Date  . Cholecystectomy    . Abdominal hysterectomy    . Knee surgery Left   . Carpal tunnel release Bilateral   . Tubal ligation    . Cystoscopy    . Colonoscopy    . Leg surgery Left     polio  . Orif patella fracture Left 07/18/2013  . Appendectomy    . Knee arthroscopy Left   . Wrist tendon transfer Right   . Wisdom tooth extraction    . Orif patella Left 07/18/2013    Procedure: OPEN REDUCTION INTERNAL (ORIF) LEFT FIXATION PATELLA;  Surgeon: Augustin Schooling, MD;  Location: [facility];  Service: Orthopedics;  Laterality: Left;    Family History  Problem Relation Age of Onset  . Heart failure Mother   . Diabetes Father   . Cancer Father   . Heart failure Father     Social History:  reports that she has never smoked. She has never used smokeless tobacco. She reports that she does not drink alcohol or use  illicit drugs.  Allergies:  Allergies  Allergen Reactions  . Ciprofloxacin Rash  . Acetaminophen Itching  . Aspirin Other (See Comments)    Chest pain  . Nsaids Other (See Comments)    Chest pain and stomach pain  . Penicillins Other (See Comments)    unknown  . Percocet [Oxycodone-Acetaminophen] Itching  . Propoxyphene N-Acetaminophen Other (See Comments)    Chest/stomach pain  . Soma Compound [Carisoprodol-Aspirin] Other (See Comments)    "cant judge distance"  . Sulfa Antibiotics Other (See Comments)    unknown  . Tolectin [Tolmetin] Itching  . Macrodantin [Nitrofurantoin Macrocrystal] Rash    Medications: I have reviewed the patient's current medications.  Results for orders placed during the hospital encounter of 09/01/13 (from the past 48 hour(s))  CBC     Status: None   Collection Time    09/01/13  9:40 PM      Result Value Ref Range   WBC 9.3  4.0 - 10.5 K/uL   RBC 4.32  3.87 - 5.11 MIL/uL   Hemoglobin 12.5  12.0 - 15.0 g/dL   HCT 36.0  36.0 - 46.0 %   MCV 83.3  78.0 -  100.0 fL   MCH 28.9  26.0 - 34.0 pg   MCHC 34.7  30.0 - 36.0 g/dL   RDW 13.5  11.5 - 15.5 %   Platelets 383  150 - 400 K/uL  BASIC METABOLIC PANEL     Status: Abnormal   Collection Time    09/01/13  9:40 PM      Result Value Ref Range   Sodium 133 (*) 137 - 147 mEq/L   Potassium 3.3 (*) 3.7 - 5.3 mEq/L   Chloride 92 (*) 96 - 112 mEq/L   CO2 28  19 - 32 mEq/L   Glucose, Bld 109 (*) 70 - 99 mg/dL   BUN 10  6 - 23 mg/dL   Creatinine, Ser 0.52  0.50 - 1.10 mg/dL   Calcium 9.7  8.4 - 10.5 mg/dL   GFR calc non Af Amer >90  >90 mL/min   GFR calc Af Amer >90  >90 mL/min   Comment: (NOTE)     The eGFR has been calculated using the CKD EPI equation.     This calculation has not been validated in all clinical situations.     eGFR's persistently <90 mL/min signify possible Chronic Kidney     Disease.  URINALYSIS, ROUTINE W REFLEX MICROSCOPIC     Status: Abnormal   Collection Time    09/01/13   9:45 PM      Result Value Ref Range   Color, Urine YELLOW  YELLOW   APPearance CLOUDY (*) CLEAR   Specific Gravity, Urine 1.007  1.005 - 1.030   pH 6.0  5.0 - 8.0   Glucose, UA NEGATIVE  NEGATIVE mg/dL   Hgb urine dipstick NEGATIVE  NEGATIVE   Bilirubin Urine NEGATIVE  NEGATIVE   Ketones, ur NEGATIVE  NEGATIVE mg/dL   Protein, ur NEGATIVE  NEGATIVE mg/dL   Urobilinogen, UA 0.2  0.0 - 1.0 mg/dL   Nitrite NEGATIVE  NEGATIVE   Leukocytes, UA NEGATIVE  NEGATIVE   Comment: MICROSCOPIC NOT DONE ON URINES WITH NEGATIVE PROTEIN, BLOOD, LEUKOCYTES, NITRITE, OR GLUCOSE <1000 mg/dL.    Dg Knee Complete 4 Views Left  09/01/2013   CLINICAL DATA:  History of polio, left knee pain  EXAM: LEFT KNEE - COMPLETE 4+ VIEW  COMPARISON:  CT knee 07/09/2013; intraoperative radiographs 07/18/2013  FINDINGS: Surgical changes of ORIF of patellar fracture without interval change in appearance compared to the prior radiographs. Advanced secondary degenerative osteoarthritis noted. Moderate suprapatellar knee joint effusion. No definite new acute fracture or malalignment.  IMPRESSION: 1. No definite new acute fracture or malalignment. 2. Moderate suprapatellar knee joint effusion. 3. Surgical changes of patellar fracture ORIF without significant interval change compared the intraoperative radiographs from 07/18/2013.   Electronically Signed   By: Jacqulynn Cadet M.D.   On: 09/01/2013 21:28     Vitals Temp:  [98.2 F (36.8 C)] 98.2 F (36.8 C) (04/20 1919) Pulse Rate:  [66-72] 66 (04/20 2325) Resp:  [16-18] 16 (04/20 2325) BP: (124-137)/(43-75) 137/43 mmHg (04/20 2325) SpO2:  [94 %-100 %] 94 % (04/20 2325) Weight:  [73.483 kg (162 lb)] 73.483 kg (162 lb) (04/20 1919) Body mass index is 31.64 kg/(m^2).  Physical Exam: Obese WF, A and O. NAD. Left leg with well healed recent incision without erythema or induration, mild effusion, chronically limited motion secondary to joint deformity and arthrosis. No cellulitis  or ascending lymphangitis.     Assessment/Plan: Impression: post operative knee pain likely secondary to underlying OA, labs and clinical exam do  not suggest an obvious infectious etiology. Treatment: discussed conservative treatment to include application of ice, contact office in am to confirm appointment with Dr. Veverly Fells. Discussed with ED staff providing patient with a prescription for dilaudid which she has used with success in past.  [place] 09/01/2013, 11:37 PM

## 2013-09-01 NOTE — ED Provider Notes (Signed)
Medical screening examination/treatment/procedure(s) were conducted as a shared visit with non-physician practitioner(s) and myself.  I personally evaluated the patient during the encounter.   EKG Interpretation None      68 year old female with recent left knee operation for patellar fixation presenting with left knee pain. Subjective fevers last night and chills throughout the day today. On exam, nontoxic, left knee with chronic deformity, multiple surgical scars, mild erythema, mild warmth compared to right knee, limitation in range of motion (reports since recent operation), good distal pulses and perfusion. Plan Ortho eval.  Clinical Impression: 1. Arthralgia of left knee       Houston Siren III, MD 09/02/13 1158

## 2013-09-01 NOTE — ED Notes (Signed)
Pt states she had left knee surgery on March 6th  Pt states her left knee is very painful and hot to touch  Pt states she had chills last night

## 2013-09-02 MED ORDER — HYDROMORPHONE HCL 2 MG PO TABS: 2.0000 mg | ORAL_TABLET | Freq: Four times a day (QID) | ORAL | Status: DC | PRN

## 2013-09-02 NOTE — Discharge Instructions (Signed)
1. Medications: dilaudid, usual home medications 2. Treatment: rest, drink plenty of fluids,  3. Follow Up: Please followup with doctor Veverly Fells in 3 days for discussion of your diagnoses and further evaluation after today's visit;     Cryotherapy Cryotherapy means treatment with cold. Ice or gel packs can be used to reduce both pain and swelling. Ice is the most helpful within the first 24 to 48 hours after an injury or flareup from overusing a muscle or joint. Sprains, strains, spasms, burning pain, shooting pain, and aches can all be eased with ice. Ice can also be used when recovering from surgery. Ice is effective, has very few side effects, and is safe for most people to use. PRECAUTIONS  Ice is not a safe treatment option for people with:  Raynaud's phenomenon. This is a condition affecting small blood vessels in the extremities. Exposure to cold may cause your problems to return.  Cold hypersensitivity. There are many forms of cold hypersensitivity, including:  Cold urticaria. Red, itchy hives appear on the skin when the tissues begin to warm after being iced.  Cold erythema. This is a red, itchy rash caused by exposure to cold.  Cold hemoglobinuria. Red blood cells break down when the tissues begin to warm after being iced. The hemoglobin that carry oxygen are passed into the urine because they cannot combine with blood proteins fast enough.  Numbness or altered sensitivity in the area being iced. If you have any of the following conditions, do not use ice until you have discussed cryotherapy with your caregiver:  Heart conditions, such as arrhythmia, angina, or chronic heart disease.  High blood pressure.  Healing wounds or open skin in the area being iced.  Current infections.  Rheumatoid arthritis.  Poor circulation.  Diabetes. Ice slows the blood flow in the region it is applied. This is beneficial when trying to stop inflamed tissues from spreading irritating  chemicals to surrounding tissues. However, if you expose your skin to cold temperatures for too long or without the proper protection, you can damage your skin or nerves. Watch for signs of skin damage due to cold. HOME CARE INSTRUCTIONS Follow these tips to use ice and cold packs safely.  Place a dry or damp towel between the ice and skin. A damp towel will cool the skin more quickly, so you may need to shorten the time that the ice is used.  For a more rapid response, add gentle compression to the ice.  Ice for no more than 10 to 20 minutes at a time. The bonier the area you are icing, the less time it will take to get the benefits of ice.  Check your skin after 5 minutes to make sure there are no signs of a poor response to cold or skin damage.  Rest 20 minutes or more in between uses.  Once your skin is numb, you can end your treatment. You can test numbness by very lightly touching your skin. The touch should be so light that you do not see the skin dimple from the pressure of your fingertip. When using ice, most people will feel these normal sensations in this order: cold, burning, aching, and numbness.  Do not use ice on someone who cannot communicate their responses to pain, such as small children or people with dementia. HOW TO MAKE AN ICE PACK Ice packs are the most common way to use ice therapy. Other methods include ice massage, ice baths, and cryo-sprays. Muscle creams that cause a  cold, tingly feeling do not offer the same benefits that ice offers and should not be used as a substitute unless recommended by your caregiver. To make an ice pack, do one of the following:  Place crushed ice or a bag of frozen vegetables in a sealable plastic bag. Squeeze out the excess air. Place this bag inside another plastic bag. Slide the bag into a pillowcase or place a damp towel between your skin and the bag.  Mix 3 parts water with 1 part rubbing alcohol. Freeze the mixture in a sealable  plastic bag. When you remove the mixture from the freezer, it will be slushy. Squeeze out the excess air. Place this bag inside another plastic bag. Slide the bag into a pillowcase or place a damp towel between your skin and the bag. SEEK MEDICAL CARE IF:  You develop white spots on your skin. This may give the skin a blotchy (mottled) appearance.  Your skin turns blue or pale.  Your skin becomes waxy or hard.  Your swelling gets worse. MAKE SURE YOU:   Understand these instructions.  Will watch your condition.  Will get help right away if you are not doing well or get worse. Document Released: 12/26/2010 Document Revised: 07/24/2011 Document Reviewed: 12/26/2010 [facility] Patient Information 2014 [place], [place].

## 2013-09-02 NOTE — ED Provider Notes (Signed)
Medical screening examination/treatment/procedure(s) were conducted as a shared visit with non-physician practitioner(s) and myself.  I personally evaluated the patient during the encounter.   EKG Interpretation None        Houston Siren III, MD 09/02/13 1159

## 2014-02-09 ENCOUNTER — Other Ambulatory Visit: Payer: Self-pay | Admitting: Cardiology

## 2014-02-11 NOTE — Telephone Encounter (Signed)
Patient is prn follow up with Dr Percival Spanish and has not been seen in a while. Not sure what current dose should be. Please advise. Thanks, MI

## 2014-03-03 ENCOUNTER — Other Ambulatory Visit: Payer: Self-pay

## 2014-03-03 DIAGNOSIS — Z1231 Encounter for screening mammogram for malignant neoplasm of breast: Secondary | ICD-10-CM

## 2014-03-03 NOTE — Telephone Encounter (Signed)
Pt. Called ,no answer LMTCB

## 2014-03-06 ENCOUNTER — Other Ambulatory Visit: Payer: Self-pay | Admitting: *Deleted

## 2014-03-06 MED ORDER — FUROSEMIDE 20 MG PO TABS: 10.0000 mg | ORAL_TABLET | ORAL | Status: DC

## 2014-03-06 NOTE — Telephone Encounter (Signed)
Pt. Lasix has been reordered

## 2014-03-24 ENCOUNTER — Ambulatory Visit: Payer: Medicare Other

## 2014-04-02 ENCOUNTER — Inpatient Hospital Stay: Admission: RE | Admit: 2014-04-02 | Payer: Medicare Other | Source: Ambulatory Visit

## 2014-04-24 ENCOUNTER — Ambulatory Visit
Admission: RE | Admit: 2014-04-24 | Discharge: 2014-04-24 | Disposition: A | Discharge status: Home / Self Care | Payer: Medicare Other | Source: Ambulatory Visit

## 2014-04-24 DIAGNOSIS — Z1231 Encounter for screening mammogram for malignant neoplasm of breast: Secondary | ICD-10-CM

## 2014-07-06 ENCOUNTER — Other Ambulatory Visit: Payer: Self-pay | Admitting: Gastroenterology

## 2014-07-06 DIAGNOSIS — R1031 Right lower quadrant pain: Secondary | ICD-10-CM

## 2014-07-09 ENCOUNTER — Ambulatory Visit
Admission: RE | Admit: 2014-07-09 | Discharge: 2014-07-09 | Disposition: A | Discharge status: Home / Self Care | Payer: Medicare Other | Source: Ambulatory Visit | Attending: Gastroenterology | Admitting: Gastroenterology

## 2014-07-09 DIAGNOSIS — R1031 Right lower quadrant pain: Secondary | ICD-10-CM

## 2014-07-09 MED ORDER — IOHEXOL 300 MG/ML  SOLN
100.0000 mL | Freq: Once | INTRAMUSCULAR | Status: AC | PRN
  Administered 2014-07-09: 100 mL via INTRAVENOUS

## 2014-07-13 ENCOUNTER — Institutional Professional Consult (permissible substitution): Payer: Medicare Other | Admitting: Pulmonary Disease

## 2014-09-08 ENCOUNTER — Ambulatory Visit (INDEPENDENT_AMBULATORY_CARE_PROVIDER_SITE_OTHER): Payer: Medicare Other | Admitting: Pulmonary Disease

## 2014-09-08 ENCOUNTER — Encounter: Payer: Self-pay | Admitting: Pulmonary Disease

## 2014-09-08 VITALS — BP 138/88 | HR 64 | Ht 60.0 in | Wt 172.6 lb

## 2014-09-08 DIAGNOSIS — G4733 Obstructive sleep apnea (adult) (pediatric): Secondary | ICD-10-CM

## 2014-09-08 NOTE — Progress Notes (Deleted)
   Subjective:    Patient ID: [name], female    DOB: 1946/01/28, 69 y.o.   MRN: [id number]  HPI    Review of Systems  Constitutional: Positive for unexpected weight change.  HENT: Positive for congestion, dental problem and sneezing. Negative for mouth sores and nosebleeds.   Respiratory: Positive for cough.   Gastrointestinal: Positive for abdominal pain.  Skin: Positive for rash ( itching).  Neurological: Positive for headaches.       Objective:   Physical Exam        Assessment & Plan:

## 2014-09-08 NOTE — Progress Notes (Addendum)
Chief Complaint  Patient presents with  . Sleep Consult    Referred by Dr. Shelia Media for snoring. Epworth score 1.    History of Present Illness: [name] is a 69 y.o. female for evaluation of sleep problems.  She has hx of polio.  She has reasonable upper body strength and cough strength, but requires wheelchair for mobility.  She states she can pivot to go from bed to chair, but can not walk.  There has been concern about her snoring.  She was dx with OSA years ago and was tx with an oral appliance.  She has not been using any therapy for sleep apnea recently.  She will have trouble with her breathing while asleep.  She will also occasionally talk in her sleep.  She goes to sleep between 9 and 10 pm.  She falls asleep in a few minutes.  She wakes up 2 to 3 times to use the bathroom.  She gets out of bed at 10 am.  She feels tired in the morning.  She denies morning headache.  She does not use anything to help her stay awake.  She has been using [place] for years to help her fall asleep.  She denies sleep walking, bruxism, or nightmares.  There is no history of restless legs.  She denies sleep hallucinations, sleep paralysis, or cataplexy.  The Epworth score is 1 out of 24.  Tests:   Nehal Witting  has a past medical history of Post-polio syndrome; Chronic back pain; Broken heart syndrome; Hypertension; CHF (congestive heart failure); Hypothyroidism; Mental disorder; Anxiety; Asthma; GERD (gastroesophageal reflux disease); Insomnia; Hyperlipemia; Osteopenia; Family history of anesthesia complication; Kidney stones; Sleep apnea; Type II diabetes mellitus; H/O hiatal hernia; Headache(784.0); Migraine; Arthritis; and Chronic lower back pain.  Emmalena Asche  has past surgical history that includes Cholecystectomy; Abdominal hysterectomy; Knee surgery (Left); Carpal tunnel release (Bilateral); Tubal ligation; Cystoscopy; Colonoscopy; Leg Surgery (Left); ORIF patella fracture (Left, 07/18/2013);  Appendectomy; Knee arthroscopy (Left); Wrist tendon transfer (Right); Wisdom tooth extraction; and ORIF patella (Left, 07/18/2013).  Prior to Admission medications   Medication Sig Start Date End Date Taking? Authorizing Provider  aspirin 81 MG tablet Take 81 mg by mouth at bedtime.    Yes Historical Provider, MD  Calcium Carbonate-Vitamin D (CALTRATE 600+D) 600-400 MG-UNIT per chew tablet Chew 1 tablet by mouth 2 (two) times daily.    Yes Historical Provider, MD  chlorthalidone (HYGROTON) 25 MG tablet Take 12.5 mg by mouth daily.  06/03/13  Yes Historical Provider, MD  citalopram (CELEXA) 40 MG tablet Take 40 mg by mouth daily.     Yes Historical Provider, MD  clonazePAM (KLONOPIN) 0.5 MG tablet Take 0.5 mg by mouth 2 (two) times daily.    Yes Historical Provider, MD  dexlansoprazole (DEXILANT) 60 MG capsule Take 60 mg by mouth at bedtime.    Yes Historical Provider, MD  dicyclomine (BENTYL) 20 MG tablet Take 1 tablet by mouth as needed. 08/24/14  Yes Historical Provider, MD  fish oil-omega-3 fatty acids 1000 MG capsule Take 2 g by mouth 2 (two) times daily.     Yes Historical Provider, MD  furosemide (LASIX) 20 MG tablet Take 0.5 tablets (10 mg total) by mouth 3 (three) times a week. 03/06/14  Yes Minus Breeding, MD  hydrOXYzine (ATARAX/VISTARIL) 25 MG tablet Take 25 mg by mouth 3 (three) times daily as needed for itching.   Yes Historical Provider, MD  levothyroxine (SYNTHROID, LEVOTHROID) 100 MCG tablet Take 100 mcg by  mouth daily before breakfast.   Yes Historical Provider, MD  losartan (COZAAR) 100 MG tablet Take 100 mg by mouth daily.  06/24/13  Yes Historical Provider, MD  metFORMIN (GLUCOPHAGE) 500 MG tablet Take 1,000 mg by mouth 2 (two) times daily with a meal.    Yes Historical Provider, MD  metoprolol succinate (TOPROL-XL) 25 MG 24 hr tablet Take 12.5 mg by mouth daily.   Yes Historical Provider, MD  Multiple Vitamins-Minerals (MULTIVITAMIN WITH MINERALS) tablet Take 1 tablet by mouth  daily.     Yes Historical Provider, MD  nitroGLYCERIN (NITROSTAT) 0.4 MG SL tablet Place 0.4 mg under the tongue every 5 (five) minutes as needed for chest pain.   Yes Historical Provider, MD  potassium chloride SA (K-DUR,KLOR-CON) 20 MEQ tablet Take 20 mEq by mouth daily.  06/11/13  Yes Historical Provider, MD  simvastatin (ZOCOR) 40 MG tablet Take 20 mg by mouth at bedtime.  10/03/10  Yes Minus Breeding, MD  tiZANidine (ZANAFLEX) 4 MG tablet Take 4 mg by mouth 5 (five) times daily as needed for muscle spasms. PAIN   Yes Historical Provider, MD  TOVIAZ 4 MG TB24 tablet Take 4 mg by mouth daily.  05/22/13  Yes Historical Provider, MD  traMADol (ULTRAM) 50 MG tablet Take 50 mg by mouth every 6 (six) hours as needed for pain. For pain.  Takes Ultram not Tramadol, dut to aAcetaminophen allergy.   Yes Historical Provider, MD  zolpidem (AMBIEN) 10 MG tablet Take 10 mg by mouth at bedtime.    Yes Historical Provider, MD  albuterol (PROVENTIL HFA;VENTOLIN HFA) 108 (90 BASE) MCG/ACT inhaler Inhale 1-2 puffs into the lungs every 6 (six) hours as needed for wheezing or shortness of breath.    Historical Provider, MD  benzonatate (TESSALON) 100 MG capsule Take 100 mg by mouth 3 (three) times daily as needed for cough.    Historical Provider, MD  Cyanocobalamin (VITAMIN B 12 PO) Take 500 mcg by mouth daily.    Historical Provider, MD  ergocalciferol (VITAMIN D2) 50000 UNITS capsule Take 50,000 Units by mouth every 30 (thirty) days.    Historical Provider, MD  furosemide (LASIX) 20 MG tablet Take 0.5 tablets (10 mg total) by mouth 3 (three) times a week. Patient not taking: Reported on 09/08/2014 03/06/14   Minus Breeding, MD  HYDROmorphone (DILAUDID) 2 MG tablet Take 1 tablet (2 mg total) by mouth every 6 (six) hours as needed. Patient not taking: Reported on 09/08/2014 09/02/13   Jarrett Soho Muthersbaugh, PA-C    Allergies  Allergen Reactions  . Ciprofloxacin Rash  . Acetaminophen Itching  . Aspirin Other (See  Comments)    Chest pain  . Nsaids Other (See Comments)    Chest pain and stomach pain  . Penicillins Other (See Comments)    unknown  . Percocet [Oxycodone-Acetaminophen] Itching  . Propoxyphene N-Acetaminophen Other (See Comments)    Chest/stomach pain  . Soma Compound [Carisoprodol-Aspirin] Other (See Comments)    "cant judge distance"  . Sulfa Antibiotics Other (See Comments)    unknown  . Tolectin [Tolmetin] Itching  . Macrodantin [Nitrofurantoin Macrocrystal] Rash    Her family history includes Cancer in her father; Diabetes in her father; Heart failure in her father and mother.  She  reports that she has never smoked. She has never used smokeless tobacco. She reports that she does not drink alcohol or use illicit drugs.  Review of Systems  Constitutional: Positive for unexpected weight change.  HENT: Positive for congestion, dental  problem and sneezing. Negative for mouth sores and nosebleeds.   Respiratory: Positive for cough.   Gastrointestinal: Positive for abdominal pain.  Skin: Positive for rash ( itching).  Neurological: Positive for headaches.   Physical Exam: Blood pressure 138/88, pulse 64, height 5' (1.524 m), weight 172 lb 9.6 oz (78.291 kg), SpO2 97 %. Body mass index is 33.71 kg/(m^2).  General - No distress, in wheel chair ENT - No sinus tenderness, no oral exudate, no LAN, no thyromegaly, TM clear, pupils equal/reactive, MP 4, scalloped tongue Cardiac - s1s2 regular, no murmur, pulses symmetric Chest - No wheeze/rales/dullness, good air entry, normal respiratory excursion Back - No focal tenderness Abd - Soft, non-tender, no organomegaly, + bowel sounds Ext - No edema Neuro - 5/5 strength upper extremities, cranial nerves intact Skin - No rashes Psych - Normal mood, and behavior  Discussion: She has snoring, daytime sleepiness, sleep disruption, and witnessed apnea.  She has hx of HTN, DM, and post-polio.  She also has hx of sleep apnea.  She likely  still has sleep disordered breathing.  This could be obstructive sleep apnea, but she could also have some degree of hypoventilation in setting of post-polio syndrome.  Assessment/plan:  Obstructive sleep apnea. Plan: - will arrange for in lab sleep study  Post-polio syndrome. She has reasonable cough, and normal pulse oximetry reading today. Plan: - she might need PFT's at some point to determine if she is developing a restrictive defect    Chesley Mires, M.D. Pager [phone]

## 2014-09-08 NOTE — Patient Instructions (Signed)
Will arrange for sleep study Will call to arrange for follow up after sleep study reviewed 

## 2014-09-10 ENCOUNTER — Encounter: Payer: Self-pay | Admitting: Pulmonary Disease

## 2014-11-22 ENCOUNTER — Ambulatory Visit (HOSPITAL_BASED_OUTPATIENT_CLINIC_OR_DEPARTMENT_OTHER): Payer: Medicare Other | Attending: Pulmonary Disease

## 2014-11-22 VITALS — Ht 60.0 in | Wt 172.0 lb

## 2014-11-22 DIAGNOSIS — R0683 Snoring: Secondary | ICD-10-CM | POA: Insufficient documentation

## 2014-11-22 DIAGNOSIS — G4733 Obstructive sleep apnea (adult) (pediatric): Secondary | ICD-10-CM

## 2014-11-22 DIAGNOSIS — I493 Ventricular premature depolarization: Secondary | ICD-10-CM | POA: Diagnosis not present

## 2014-11-27 ENCOUNTER — Telehealth: Payer: Self-pay | Admitting: Pulmonary Disease

## 2014-11-27 DIAGNOSIS — R0683 Snoring: Secondary | ICD-10-CM | POA: Diagnosis not present

## 2014-11-27 DIAGNOSIS — G4710 Hypersomnia, unspecified: Secondary | ICD-10-CM | POA: Diagnosis not present

## 2014-11-27 NOTE — Telephone Encounter (Signed)
PSG 11/22/14 >> AHI 0, SpO2 low 87%.  Will have my nurse inform pt that sleep study did not show sleep apnea.  No additional interventions are required.  She can return to pulmonary/sleep clinic as needed if her sleep patterns gets worse.

## 2014-11-27 NOTE — Progress Notes (Signed)
Patient Name: [name], [name] Date: 11/22/2014 Gender: Female D.O.B: September 08, 1945 Age (years): 43 Referring Provider: Not Available Height (inches): 60 Interpreting Physician: Chesley Mires MD, ABSM Weight (lbs): 172 RPSGT: Joni Reining BMI: 34 MRN: [id number] Neck Size: 15.00  CLINICAL INFORMATION Sleep Study Type: NPSG  Indication for sleep study: N/A  Epworth Sleepiness Score: 2  SLEEP STUDY TECHNIQUE As per the AASM Manual for the Scoring of Sleep and Associated Events v2.3 (April 2016) with a hypopnea requiring 4% desaturations.  The channels recorded and monitored were frontal, central and occipital EEG, electrooculogram (EOG), submentalis EMG (chin), nasal and oral airflow, thoracic and abdominal wall motion, anterior tibialis EMG, snore microphone, electrocardiogram, and pulse oximetry.  MEDICATIONS Patient's medications include: reviewed in electronic medical record. Medications self-administered by patient during sleep study : No sleep medicine administered.  SLEEP ARCHITECTURE The study was initiated at 10:27:32 PM and ended at 4:54:53 AM.  Sleep onset time was 82.1 minutes and the sleep efficiency was 35.2%. The total sleep time was 136.5 minutes.  Stage REM latency was N/A minutes.  The patient spent 12.45% of the night in stage N1 sleep, 87.55% in stage N2 sleep, 0.00% in stage N3 and 0.00% in REM.  Alpha intrusion was absent.  Supine sleep was 0.00%.  RESPIRATORY PARAMETERS The overall apnea/hypopnea index (AHI) was 0.0 per hour. There were 0 total apneas, including 0 obstructive, 0 central and 0 mixed apneas. There were 0 hypopneas and 7 RERAs.  The AHI during Stage REM sleep was N/A per hour.  AHI while supine was N/A per hour.  The mean oxygen saturation was 95.41%. The minimum SpO2 during sleep was 93.00%.  Moderate snoring was noted during this study.  CARDIAC DATA The 2 lead EKG demonstrated sinus rhythm. The mean heart rate was 48.08 beats  per minute. Other EKG findings include: PVCs.  LEG MOVEMENT DATA The total PLMS were 7 with a resulting PLMS index of 3.08. Associated arousal with leg movement index was 0.0 .  IMPRESSIONS No significant obstructive sleep apnea occurred during this study (AHI = 0.0/h). No significant central sleep apnea occurred during this study (CAI = 0.0/h). Mild oxygen desaturation was noted during this study (Min O2 = 93.00%). The patient snored with Moderate snoring volume. EKG findings include PVCs. Clinically significant periodic limb movements did not occur during sleep. No significant associated arousals. test 2 of template  DIAGNOSIS Snoring  RECOMMENDATIONS Avoid alcohol, sedatives and other CNS depressants that may worsen sleep apnea and disrupt normal sleep architecture. Sleep hygiene should be reviewed to assess factors that may improve sleep quality. Weight management and regular exercise should be initiated or continued if appropriate.  Chesley Mires, MD [facility] 11/27/2014, 12:01 PM American Board of Sleep Medicine

## 2014-11-30 NOTE — Telephone Encounter (Signed)
ATC x 2 Rang several times, no answer WCB

## 2014-12-03 NOTE — Telephone Encounter (Signed)
Attempted to call pt. Phone rang and someone picked up but no one answered.

## 2014-12-08 NOTE — Telephone Encounter (Signed)
Pt returning call for results and can be reached @ [phone].[name]

## 2014-12-08 NOTE — Telephone Encounter (Signed)
Results have been explained to patient, pt expressed understanding. Nothing further needed.  

## 2014-12-14 ENCOUNTER — Telehealth: Payer: Self-pay | Admitting: Cardiology

## 2014-12-14 NOTE — Telephone Encounter (Signed)
Patient called this morning and said that her blood pressure readings have been high.  Has been taking B/P frequently but not sure the dates and tinme  Took B/P on Saturday and it was 190/110   This morning 195/85 P 56  Denies any symptoms that would cause he to think her blood pressure was elevated.  Has an appointment for B/P check with Dr, Shelia Media PCP on 8/4  Asked patient to call back Dr. Shelia Media office to see if she can get in to see him before Thrusday  Told her to call me back and let me know so that we can make sure she is taken care of.

## 2014-12-14 NOTE — Telephone Encounter (Signed)
Pt called in stating that recently her BP has been high and she is concerned and would like to come in and be seen. She says that she is scheduled to see her PCP on 8/4 and have some labs drawn but she wanted to be seen before then. Please call  Thanks

## 2014-12-21 ENCOUNTER — Other Ambulatory Visit: Payer: Self-pay

## 2014-12-21 ENCOUNTER — Telehealth: Payer: Self-pay | Admitting: Cardiovascular Disease

## 2014-12-21 DIAGNOSIS — Z1231 Encounter for screening mammogram for malignant neoplasm of breast: Secondary | ICD-10-CM

## 2014-12-21 NOTE — Telephone Encounter (Signed)
Received records from [facility] for appointment on 12/24/14 with Dr Oval Linsey.  Records given to [facility] (medical records) for Dr Blenda Mounts schedule on 12/24/14. lp

## 2014-12-23 NOTE — Progress Notes (Signed)
Cardiology Office Note   Date:  12/24/2014   ID:  [name], DOB Oct 03, 1945, MRN [id number]  PCP:  Horatio Pel, MD  Cardiologist:   Sharol Harness, MD   Chief Complaint  Patient presents with  . New Evaluation    no chest pain, no shortness of breath, edema-Right leg and ankle, no pain in legs, has cramping in left leg, no lightheadedness, no dizziness      History of Present Illness: [name] is a 69 y.o. female with HTN, atrial fibrillation, Takotsubo syndrome, DM, and hypothyroidism who presents for hypertension management.  [name] has been working with her primary care provider, Dr. Deland Pretty, to control her high blood pressure.  She was previous on chlorthalidone but had to go to the bathroom too frequently so this was discontinued. At home her blood pressure was 696 systolic which prompted her to come in for evaluation. On August 7 she was started on telmisartan and amlodipine was started the following day. She brings a log of blood pressure measurements which show that her blood pressure has been running from the 130s to the 170s since that time though more recent readings have been coming down into the 120s/60s to 70s. She has been taking metoprolol succinate 25mg  daily since she was diagnosed with Takotsubo cardiomyopathy 3 years ago.   [name] was also taking furosemide 10mg  M/W/F but stopped this because of frequent urination. Since that time she's noticed an increase in ankle swelling. She denies any weight changes, infections been losing weight. She thinks this is mostly because of decreased appetite.  [name] notes occasional episodes of sharp chest pain that radiates across the left side of her chest. There is associated shortness of breath and nausea.  The episodes last for minutes and occurs sporadically. She does not get any exercise and is unable to walk. She is not experiences chest discomfort with transferring physicians, which is the most  strenuous activity that she does.  [name] underwent a sleep study on 11/22/14 that did not reveal sleep apnea.  Past Medical History  Diagnosis Date  . Post-polio syndrome   . Chronic back pain   . Takotsubo cardiomyopathy   . Hypertension   . CHF (congestive heart failure)   . Hypothyroidism   . Mental disorder   . Anxiety   . Asthma   . GERD (gastroesophageal reflux disease)   . Insomnia   . Hyperlipemia   . Osteopenia   . Family history of anesthesia complication     Mother and Sister- N/V  . Kidney stones   . Sleep apnea   . Type II diabetes mellitus   . H/O hiatal hernia   . VELFYBOF(751.0)     "weekly" (07/18/2013)  . Migraine     "years ago" (07/18/2013)  . Arthritis     "hands, knees, back" (07/18/2013)  . Chronic lower back pain   . Post-polio syndrome     Past Surgical History  Procedure Laterality Date  . Cholecystectomy    . Abdominal hysterectomy    . Knee surgery Left   . Carpal tunnel release Bilateral   . Tubal ligation    . Cystoscopy    . Colonoscopy    . Leg surgery Left     polio  . Orif patella fracture Left 07/18/2013  . Appendectomy    . Knee arthroscopy Left   . Wrist tendon transfer Right   . Wisdom tooth extraction    .  Orif patella Left 07/18/2013    Procedure: OPEN REDUCTION INTERNAL (ORIF) LEFT FIXATION PATELLA;  Surgeon: Augustin Schooling, MD;  Location: [facility];  Service: Orthopedics;  Laterality: Left;     Current Outpatient Prescriptions  Medication Sig Dispense Refill  . amLODipine (NORVASC) 5 MG tablet Take 1 tablet by mouth daily. Take 1 tab daily  0  . aspirin 81 MG tablet Take 81 mg by mouth at bedtime.     . benzonatate (TESSALON) 100 MG capsule Take 100 mg by mouth 3 (three) times daily as needed for cough.    . Calcium Carbonate-Vitamin D (CALTRATE 600+D) 600-400 MG-UNIT per chew tablet Chew 1 tablet by mouth 2 (two) times daily.     . citalopram (CELEXA) 40 MG tablet Take 40 mg by mouth daily.      . clonazePAM (KLONOPIN)  0.5 MG tablet Take 0.5 mg by mouth 2 (two) times daily.     Marland Kitchen dicyclomine (BENTYL) 20 MG tablet Take 1 tablet by mouth as needed.  2  . fish oil-omega-3 fatty acids 1000 MG capsule Take 2 g by mouth 2 (two) times daily.      . hydrOXYzine (ATARAX/VISTARIL) 25 MG tablet Take 25 mg by mouth 3 (three) times daily as needed for itching.    . levothyroxine (SYNTHROID, LEVOTHROID) 100 MCG tablet Take 100 mcg by mouth daily before breakfast.    . metFORMIN (GLUCOPHAGE) 500 MG tablet Take 1,000 mg by mouth 2 (two) times daily with a meal.     . metoprolol succinate (TOPROL-XL) 25 MG 24 hr tablet Take 12.5 mg by mouth daily.    . Multiple Vitamins-Minerals (MULTIVITAMIN WITH MINERALS) tablet Take 1 tablet by mouth daily.      . nitroGLYCERIN (NITROSTAT) 0.4 MG SL tablet Place 0.4 mg under the tongue every 5 (five) minutes as needed for chest pain.    Marland Kitchen nystatin cream (MYCOSTATIN) Use as needed  0  . pantoprazole (PROTONIX) 40 MG tablet Take 1 tablet by mouth daily. Take 1 tab daily  98  . potassium chloride SA (K-DUR,KLOR-CON) 20 MEQ tablet Take 20 mEq by mouth 3 (three) times daily.    . simvastatin (ZOCOR) 40 MG tablet Take 20 mg by mouth at bedtime.     Marland Kitchen telmisartan (MICARDIS) 80 MG tablet Take 1 tablet by mouth daily. Take 1 tab daily  0  . tiZANidine (ZANAFLEX) 4 MG tablet Take 4 mg by mouth 5 (five) times daily as needed for muscle spasms. PAIN    . TOVIAZ 4 MG TB24 tablet Take 4 mg by mouth daily.     Marland Kitchen zolpidem (AMBIEN) 10 MG tablet Take 10 mg by mouth at bedtime.      No current facility-administered medications for this visit.    Allergies:   Ciprofloxacin; Acetaminophen; Aspirin; Nsaids; Penicillins; Percocet; Propoxyphene n-acetaminophen; Soma compound; Sulfa antibiotics; Tolectin; and Macrodantin    Social History:  The patient  reports that she has never smoked. She has never used smokeless tobacco. She reports that she does not drink alcohol or use illicit drugs.   Family History:   The patient's family history includes Allergies in her grandchild; Cancer in her father; Diabetes in her father; Heart failure in her father and mother.    ROS:  Please see the history of present illness.   Otherwise, review of systems are positive for none.   All other systems are reviewed and negative.    PHYSICAL EXAM: VS:  BP 110/60 mmHg  Pulse  61  Ht 5' (1.524 m)  Wt 72.576 kg (160 lb)  BMI 31.25 kg/m2 , BMI Body mass index is 31.25 kg/(m^2). GENERAL:  Well appearing.  Obese.  Exam performed as patient sat in her scooter HEENT:  Pupils equal round and reactive, fundi not visualized, oral mucosa unremarkable NECK:  No jugular venous distention, waveform within normal limits, carotid upstroke brisk and symmetric, no bruits, no thyromegaly LYMPHATICS:  No cervical adenopathy LUNGS:  Clear to auscultation bilaterally HEART:  RRR.  PMI not displaced or sustained,S1 and S2 within normal limits, no S3, no S4, no clicks, no rubs, no murmurs ABD:  Flat, positive bowel sounds normal in frequency in pitch, no bruits, no rebound, no guarding, no midline pulsatile mass, no hepatomegaly, no splenomegaly EXT:  2 plus pulses throughout, 1+ LE edema to the mid tibia, no cyanosis no clubbing SKIN:  No rashes no nodules NEURO:  Cranial nerves II through XII grossly intact, motor grossly intact throughout PSYCH:  Cognitively intact, oriented to person place and time    EKG:  EKG is ordered today. The ekg ordered today demonstrates sinus rhythm.  61 bpm.  L axis deviation.  Low voltage.   Recent Labs: No results found for requested labs within last 365 days.    Lipid Panel No results found for: CHOL, TRIG, HDL, CHOLHDL, VLDL, LDLCALC, LDLDIRECT  11/23/14: Chol 111, tri 106, hdl 42, ldl 48 Hgb A1c 5.5   Wt Readings from Last 3 Encounters:  12/24/14 72.576 kg (160 lb)  11/22/14 78.019 kg (172 lb)  09/08/14 78.291 kg (172 lb 9.6 oz)    TTE 01/31/10: EF 55-60%.  No wall motion abnormalities.   Grade 1 diastolic dysfunction.  Calcification of anterior mitral chordae.  Mildly elevated PASP.   Other studies Reviewed: Additional studies/ records that were reviewed today include: medical record. Review of the above records demonstrates:  Please see elsewhere in the note.     ASSESSMENT AND PLAN:  # Hypertension: BP is well controlled today. Her blood pressure at home has improved but sometimes is still above goal with systolic blood pressures in the 140s to 150s. Will not make any changes to her antihypertension regimen today with the exception of restarting her Lasix 10 mg Monday Wednesday Friday, she is troubled by her lower extremity edema. She continues to have issues with blood pressure control we can switch metoprolol succinate to carvedilol for improved hypertension management. Additionally she is unsure of how much telmisartan costs.  If cost is a concern, this could be switched to lisinopril.  She does not remember being on an ACE-I in the past. - Restart lasix 10mg  MWF - Continue amlodipine 5mg  daily, metoprolol succinate 25mg  daily, and telmisartan - Continue to monitor BP at home  # Chest pain: Ms. Pomplun has atypical chest pain. However she has risk factors for coronary disease given her age, hypertension, and hyperlipidemia. Given that she is unable to exercise we will obtain a Lexiscan Myoview. - Continue aspirin, statin, beta blocker - Added prn nitroglycerin  # Hyperlipidemia:  Lipids are well-controlled.  ASCVD 10 year risk of cardiac disease is approximately 7.8% on statin therapy.  Could consider switching to high-dose statin therapy.    # Takotsubo cardiomyopathy: Resolved.   Current medicines are reviewed at length with the patient today.  The patient does not have concerns regarding medicines.  The following changes have been made:  See above  Labs/ tests ordered today include: Lexiscan Myoview   No orders of  the defined types were placed in this encounter.      Disposition:   FU with Dr. Jonelle Sidle C. Delleker in 1 year if BP is not well-controlled with the above   Signed, Sharol Harness, MD  12/24/2014 11:03 AM    [place]

## 2014-12-24 ENCOUNTER — Encounter: Payer: Self-pay | Admitting: Cardiovascular Disease

## 2014-12-24 ENCOUNTER — Ambulatory Visit (INDEPENDENT_AMBULATORY_CARE_PROVIDER_SITE_OTHER): Payer: Medicare Other | Admitting: Cardiovascular Disease

## 2014-12-24 VITALS — BP 110/60 | HR 61 | Ht 60.0 in | Wt 160.0 lb

## 2014-12-24 DIAGNOSIS — R609 Edema, unspecified: Secondary | ICD-10-CM | POA: Diagnosis not present

## 2014-12-24 DIAGNOSIS — R079 Chest pain, unspecified: Secondary | ICD-10-CM

## 2014-12-24 DIAGNOSIS — I10 Essential (primary) hypertension: Secondary | ICD-10-CM | POA: Diagnosis not present

## 2014-12-24 MED ORDER — FUROSEMIDE 20 MG PO TABS: 10.0000 mg | ORAL_TABLET | ORAL | Status: DC

## 2014-12-24 NOTE — Patient Instructions (Signed)
RESTART FUROSEMIDE 10 MG ( 1/2 TABLET OF 20 MG TABLET) ON MONDAY - Wednesday - Friday    Your physician has requested that you have a lexiscan myoview. For further information please visit [email or web address]. Please follow instruction sheet, as given.   Your physician wants you to follow-up in [place].  You will receive a reminder letter in the mail two months in advance. If you don't receive a letter, please call our office to schedule the follow-up appointment.LE

## 2014-12-28 ENCOUNTER — Telehealth: Payer: Self-pay | Admitting: Cardiovascular Disease

## 2014-12-28 NOTE — Telephone Encounter (Signed)
Pt called back. Explained that BP was low on check Friday night, she took several times, each time low. She got so frustrated she called EMS.  Noted EMS took pressure, was normal. They indicated may be a BP cuff issue.  When asked if there had been any symptoms w/ the low reading, pt states no.   Advised new cuff , pt acknowledged, ccontinue to check daily. Instructed to call if any issues, i.e. Lightheadedness, dizziness, syncope. Pt voiced agreement w/ plan.

## 2014-12-28 NOTE — Telephone Encounter (Signed)
Left msg instructing patient to return call.

## 2014-12-28 NOTE — Telephone Encounter (Signed)
Per answering service: 8/12 @10 :19:  Pt called in stating that her BP was 87/49 and HR is 55. Please call back and advise.  Thanks

## 2014-12-29 ENCOUNTER — Telehealth (HOSPITAL_COMMUNITY): Payer: Self-pay

## 2014-12-29 NOTE — Telephone Encounter (Signed)
Encounter complete. 

## 2014-12-31 ENCOUNTER — Ambulatory Visit (HOSPITAL_COMMUNITY)
Admission: RE | Admit: 2014-12-31 | Discharge: 2014-12-31 | Disposition: A | Discharge status: Home / Self Care | Payer: Medicare Other | Source: Ambulatory Visit | Attending: Cardiology | Admitting: Cardiology

## 2014-12-31 DIAGNOSIS — R079 Chest pain, unspecified: Secondary | ICD-10-CM | POA: Insufficient documentation

## 2014-12-31 DIAGNOSIS — E119 Type 2 diabetes mellitus without complications: Secondary | ICD-10-CM | POA: Diagnosis not present

## 2014-12-31 DIAGNOSIS — R609 Edema, unspecified: Secondary | ICD-10-CM | POA: Diagnosis not present

## 2014-12-31 DIAGNOSIS — I10 Essential (primary) hypertension: Secondary | ICD-10-CM

## 2014-12-31 LAB — MYOCARDIAL PERFUSION IMAGING
CHL CUP RESTING HR STRESS: 62 {beats}/min
CSEPPHR: 79 {beats}/min
LVDIAVOL: 63 mL
LVSYSVOL: 18 mL
NUC STRESS TID: 0.81
SDS: 6
SRS: 0
SSS: 6

## 2014-12-31 MED ORDER — TECHNETIUM TC 99M SESTAMIBI GENERIC - CARDIOLITE
31.8000 | Freq: Once | INTRAVENOUS | Status: AC | PRN
  Administered 2014-12-31: 31.8 via INTRAVENOUS

## 2014-12-31 MED ORDER — TECHNETIUM TC 99M SESTAMIBI GENERIC - CARDIOLITE
10.9000 | Freq: Once | INTRAVENOUS | Status: AC | PRN
  Administered 2014-12-31: 10.9 via INTRAVENOUS

## 2014-12-31 MED ORDER — REGADENOSON 0.4 MG/5ML IV SOLN
0.4000 mg | Freq: Once | INTRAVENOUS | Status: AC
  Administered 2014-12-31: 0.4 mg via INTRAVENOUS

## 2014-12-31 MED ORDER — AMINOPHYLLINE 25 MG/ML IV SOLN
75.0000 mg | Freq: Once | INTRAVENOUS | Status: AC
  Administered 2014-12-31: 75 mg via INTRAVENOUS

## 2015-04-28 ENCOUNTER — Ambulatory Visit: Payer: Medicare Other

## 2015-05-06 ENCOUNTER — Ambulatory Visit: Payer: Medicare Other

## 2015-05-13 ENCOUNTER — Emergency Department (HOSPITAL_COMMUNITY)
Admission: EM | Admit: 2015-05-13 | Discharge: 2015-05-13 | Disposition: A | Discharge status: Home / Self Care | Payer: Medicare Other | Attending: Emergency Medicine | Admitting: Emergency Medicine

## 2015-05-13 ENCOUNTER — Encounter (HOSPITAL_COMMUNITY): Payer: Self-pay | Admitting: Emergency Medicine

## 2015-05-13 ENCOUNTER — Emergency Department (HOSPITAL_COMMUNITY): Payer: Medicare Other

## 2015-05-13 DIAGNOSIS — G43909 Migraine, unspecified, not intractable, without status migrainosus: Secondary | ICD-10-CM | POA: Diagnosis not present

## 2015-05-13 DIAGNOSIS — M8580 Other specified disorders of bone density and structure, unspecified site: Secondary | ICD-10-CM | POA: Diagnosis not present

## 2015-05-13 DIAGNOSIS — Z8612 Personal history of poliomyelitis: Secondary | ICD-10-CM | POA: Diagnosis not present

## 2015-05-13 DIAGNOSIS — Z87442 Personal history of urinary calculi: Secondary | ICD-10-CM | POA: Diagnosis not present

## 2015-05-13 DIAGNOSIS — E039 Hypothyroidism, unspecified: Secondary | ICD-10-CM | POA: Insufficient documentation

## 2015-05-13 DIAGNOSIS — Z79899 Other long term (current) drug therapy: Secondary | ICD-10-CM | POA: Insufficient documentation

## 2015-05-13 DIAGNOSIS — Z880 Allergy status to penicillin: Secondary | ICD-10-CM | POA: Insufficient documentation

## 2015-05-13 DIAGNOSIS — E119 Type 2 diabetes mellitus without complications: Secondary | ICD-10-CM | POA: Insufficient documentation

## 2015-05-13 DIAGNOSIS — E785 Hyperlipidemia, unspecified: Secondary | ICD-10-CM | POA: Insufficient documentation

## 2015-05-13 DIAGNOSIS — Y9389 Activity, other specified: Secondary | ICD-10-CM | POA: Diagnosis not present

## 2015-05-13 DIAGNOSIS — Y998 Other external cause status: Secondary | ICD-10-CM | POA: Insufficient documentation

## 2015-05-13 DIAGNOSIS — I509 Heart failure, unspecified: Secondary | ICD-10-CM | POA: Diagnosis not present

## 2015-05-13 DIAGNOSIS — F419 Anxiety disorder, unspecified: Secondary | ICD-10-CM | POA: Insufficient documentation

## 2015-05-13 DIAGNOSIS — J45909 Unspecified asthma, uncomplicated: Secondary | ICD-10-CM | POA: Diagnosis not present

## 2015-05-13 DIAGNOSIS — I10 Essential (primary) hypertension: Secondary | ICD-10-CM | POA: Insufficient documentation

## 2015-05-13 DIAGNOSIS — Z7982 Long term (current) use of aspirin: Secondary | ICD-10-CM | POA: Insufficient documentation

## 2015-05-13 DIAGNOSIS — S6992XA Unspecified injury of left wrist, hand and finger(s), initial encounter: Secondary | ICD-10-CM | POA: Diagnosis present

## 2015-05-13 DIAGNOSIS — G8929 Other chronic pain: Secondary | ICD-10-CM | POA: Diagnosis not present

## 2015-05-13 DIAGNOSIS — G4700 Insomnia, unspecified: Secondary | ICD-10-CM | POA: Diagnosis not present

## 2015-05-13 DIAGNOSIS — K219 Gastro-esophageal reflux disease without esophagitis: Secondary | ICD-10-CM | POA: Insufficient documentation

## 2015-05-13 DIAGNOSIS — S63502A Unspecified sprain of left wrist, initial encounter: Secondary | ICD-10-CM | POA: Insufficient documentation

## 2015-05-13 DIAGNOSIS — W06XXXA Fall from bed, initial encounter: Secondary | ICD-10-CM | POA: Insufficient documentation

## 2015-05-13 DIAGNOSIS — M158 Other polyosteoarthritis: Secondary | ICD-10-CM | POA: Insufficient documentation

## 2015-05-13 DIAGNOSIS — Y9289 Other specified places as the place of occurrence of the external cause: Secondary | ICD-10-CM | POA: Diagnosis not present

## 2015-05-13 MED ORDER — MORPHINE SULFATE (PF) 4 MG/ML IV SOLN
4.0000 mg | Freq: Once | INTRAVENOUS | Status: AC
  Administered 2015-05-13: 4 mg via INTRAMUSCULAR
  Filled 2015-05-13: qty 1

## 2015-05-13 MED ORDER — TRAMADOL HCL 50 MG PO TABS
50.0000 mg | ORAL_TABLET | Freq: Once | ORAL | Status: DC
  Filled 2015-05-13: qty 1

## 2015-05-13 NOTE — Discharge Instructions (Signed)
It was our pleasure to provide your ER care today - we hope that you feel better.  Take your pain medication as need.  Wear splint as need for comfort/support for the next few days.  Follow up with primary care doctor in 1 week if symptoms fail to improve/resolve.  Return to ER if worse, new symptoms, new or severe pain, other concern.    Wrist Pain There are many things that can cause wrist pain. Some common causes include:  An injury to the wrist area, such as a sprain, strain, or fracture.  Overuse of the joint.  A condition that causes increased pressure on a nerve in the wrist (carpal tunnel syndrome).  Wear and tear of the joints that occurs with aging (osteoarthritis).  A variety of other types of arthritis. Sometimes, the cause of wrist pain is not known. The pain often goes away when you follow your health care provider's instructions for relieving pain at home. If your wrist pain continues, tests may need to be done to diagnose your condition. HOME CARE INSTRUCTIONS Pay attention to any changes in your symptoms. Take these actions to help with your pain:  Rest the wrist area for at least 48 hours or as told by your health care provider.  If directed, apply ice to the injured area:  Put ice in a plastic bag.  Place a towel between your skin and the bag.  Leave the ice on for 20 minutes, 2-3 times per day.  Keep your arm raised (elevated) above the level of your heart while you are sitting or lying down.  If a splint or elastic bandage has been applied, use it as told by your health care provider.  Remove the splint or bandage only as told by your health care provider.  Loosen the splint or bandage if your fingers become numb or have a tingling feeling, or if they turn cold or blue.  Take over-the-counter and prescription medicines only as told by your health care provider.  Keep all follow-up visits as told by your health care provider. This is  important. SEEK MEDICAL CARE IF:  Your pain is not helped by treatment.  Your pain gets worse. SEEK IMMEDIATE MEDICAL CARE IF:  Your fingers become swollen.  Your fingers turn white, very red, or cold and blue.  Your fingers are numb or have a tingling feeling.  You have difficulty moving your fingers.   This information is not intended to replace advice given to you by your health care provider. Make sure you discuss any questions you have with your health care provider.   Document Released: 02/08/2005 Document Revised: 01/20/2015 Document Reviewed: 09/16/2014 Elsevier Interactive Patient Education 2016 [place].    Cryotherapy Cryotherapy means treatment with cold. Ice or gel packs can be used to reduce both pain and swelling. Ice is the most helpful within the first 24 to 48 hours after an injury or flare-up from overusing a muscle or joint. Sprains, strains, spasms, burning pain, shooting pain, and aches can all be eased with ice. Ice can also be used when recovering from surgery. Ice is effective, has very few side effects, and is safe for most people to use. PRECAUTIONS  Ice is not a safe treatment option for people with:  Raynaud phenomenon. This is a condition affecting small blood vessels in the extremities. Exposure to cold may cause your problems to return.  Cold hypersensitivity. There are many forms of cold hypersensitivity, including:  Cold urticaria. Red, itchy  hives appear on the skin when the tissues begin to warm after being iced.  Cold erythema. This is a red, itchy rash caused by exposure to cold.  Cold hemoglobinuria. Red blood cells break down when the tissues begin to warm after being iced. The hemoglobin that carry oxygen are passed into the urine because they cannot combine with blood proteins fast enough.  Numbness or altered sensitivity in the area being iced. If you have any of the following conditions, do not use ice until you have discussed  cryotherapy with your caregiver:  Heart conditions, such as arrhythmia, angina, or chronic heart disease.  High blood pressure.  Healing wounds or open skin in the area being iced.  Current infections.  Rheumatoid arthritis.  Poor circulation.  Diabetes. Ice slows the blood flow in the region it is applied. This is beneficial when trying to stop inflamed tissues from spreading irritating chemicals to surrounding tissues. However, if you expose your skin to cold temperatures for too long or without the proper protection, you can damage your skin or nerves. Watch for signs of skin damage due to cold. HOME CARE INSTRUCTIONS Follow these tips to use ice and cold packs safely.  Place a dry or damp towel between the ice and skin. A damp towel will cool the skin more quickly, so you may need to shorten the time that the ice is used.  For a more rapid response, add gentle compression to the ice.  Ice for no more than 10 to 20 minutes at a time. The bonier the area you are icing, the less time it will take to get the benefits of ice.  Check your skin after 5 minutes to make sure there are no signs of a poor response to cold or skin damage.  Rest 20 minutes or more between uses.  Once your skin is numb, you can end your treatment. You can test numbness by very lightly touching your skin. The touch should be so light that you do not see the skin dimple from the pressure of your fingertip. When using ice, most people will feel these normal sensations in this order: cold, burning, aching, and numbness.  Do not use ice on someone who cannot communicate their responses to pain, such as small children or people with dementia. HOW TO MAKE AN ICE PACK Ice packs are the most common way to use ice therapy. Other methods include ice massage, ice baths, and cryosprays. Muscle creams that cause a cold, tingly feeling do not offer the same benefits that ice offers and should not be used as a substitute  unless recommended by your caregiver. To make an ice pack, do one of the following:  Place crushed ice or a bag of frozen vegetables in a sealable plastic bag. Squeeze out the excess air. Place this bag inside another plastic bag. Slide the bag into a pillowcase or place a damp towel between your skin and the bag.  Mix 3 parts water with 1 part rubbing alcohol. Freeze the mixture in a sealable plastic bag. When you remove the mixture from the freezer, it will be slushy. Squeeze out the excess air. Place this bag inside another plastic bag. Slide the bag into a pillowcase or place a damp towel between your skin and the bag. SEEK MEDICAL CARE IF:  You develop white spots on your skin. This may give the skin a blotchy (mottled) appearance.  Your skin turns blue or pale.  Your skin becomes waxy or hard.  Your swelling gets worse. MAKE SURE YOU:   Understand these instructions.  Will watch your condition.  Will get help right away if you are not doing well or get worse.   This information is not intended to replace advice given to you by your health care provider. Make sure you discuss any questions you have with your health care provider.   Document Released: 12/26/2010 Document Revised: 05/22/2014 Document Reviewed: 12/26/2010 Elsevier Interactive Patient Education [employer].

## 2015-05-13 NOTE — ED Notes (Signed)
Per pt, states she fell transferring from bed to wheelchair injuring left wrist-did not hit head, no LOC

## 2015-05-13 NOTE — ED Notes (Signed)
MD at bedside. 

## 2015-05-13 NOTE — ED Provider Notes (Addendum)
CSN: [id number]     Arrival date & time 05/13/15  [id number] History   First MD Initiated Contact with Patient 05/13/15 1027     Chief Complaint  Patient presents with  . Wrist Pain     (Consider location/radiation/quality/duration/timing/severity/associated sxs/prior Treatment) Patient is a 69 y.o. female presenting with wrist pain. The history is provided by the patient.  Wrist Pain Pertinent negatives include no headaches.  Patient c/o left wrist pain s/p fall from bed/chair yesterday AM.  Pt indicates at her baseline is in wheelchair.  Was transferring from bed to chair yesterday am, when her hand slipped, causing her to fall onto left wrist. Dull, moderate pain to wrist since. Denies head injury or loc. No faintness or dizziness. No neck or back pain. Denies other pain or injury.  States otherwise recent health at baseline. Skin is intact. No numbness/weakness or loss of normal functional ability.      Past Medical History  Diagnosis Date  . Post-polio syndrome   . Chronic back pain   . Takotsubo cardiomyopathy   . Hypertension   . CHF (congestive heart failure) ([place])   . Hypothyroidism   . Mental disorder   . Anxiety   . Asthma   . GERD (gastroesophageal reflux disease)   . Insomnia   . Hyperlipemia   . Osteopenia   . Family history of anesthesia complication     Mother and Sister- N/V  . Kidney stones   . Sleep apnea   . Type II diabetes mellitus ([place])   . H/O hiatal hernia   . [id number])     "weekly" (07/18/2013)  . Migraine     "years ago" (07/18/2013)  . Arthritis     "hands, knees, back" (07/18/2013)  . Chronic lower back pain   . Post-polio syndrome    Past Surgical History  Procedure Laterality Date  . Cholecystectomy    . Abdominal hysterectomy    . Knee surgery Left   . Carpal tunnel release Bilateral   . Tubal ligation    . Cystoscopy    . Colonoscopy    . Leg surgery Left     polio  . Orif patella fracture Left 07/18/2013  . Appendectomy    .  Knee arthroscopy Left   . Wrist tendon transfer Right   . Wisdom tooth extraction    . Orif patella Left 07/18/2013    Procedure: OPEN REDUCTION INTERNAL (ORIF) LEFT FIXATION PATELLA;  Surgeon: Augustin Schooling, MD;  Location: [facility];  Service: Orthopedics;  Laterality: Left;   Family History  Problem Relation Age of Onset  . Heart failure Mother   . Parkinson's disease Mother   . Diabetes Father   . Cancer Father   . Heart failure Father   . Allergies Grandchild    Social History  Substance Use Topics  . Smoking status: Never Smoker   . Smokeless tobacco: Never Used  . Alcohol Use: No   OB History    No data available     Review of Systems  Constitutional: Negative for fever and chills.  Gastrointestinal: Negative for vomiting.  Skin: Negative for wound.  Neurological: Negative for weakness, light-headedness, numbness and headaches.      Allergies  Ciprofloxacin; Acetaminophen; Aspirin; Nsaids; Penicillins; Percocet; Propoxyphene n-acetaminophen; Soma compound; Sulfa antibiotics; Tolectin; and Macrodantin  Home Medications   Prior to Admission medications   Medication Sig Start Date End Date Taking? Authorizing Provider  amLODipine (NORVASC) 5 MG tablet Take 1 tablet by  mouth daily. Take 1 tab daily 12/16/14   Historical Provider, MD  aspirin 81 MG tablet Take 81 mg by mouth at bedtime.     Historical Provider, MD  benzonatate (TESSALON) 100 MG capsule Take 100 mg by mouth 3 (three) times daily as needed for cough.    Historical Provider, MD  Calcium Carbonate-Vitamin D (CALTRATE 600+D) 600-400 MG-UNIT per chew tablet Chew 1 tablet by mouth 2 (two) times daily.     Historical Provider, MD  citalopram (CELEXA) 40 MG tablet Take 40 mg by mouth daily.      Historical Provider, MD  clonazePAM (KLONOPIN) 0.5 MG tablet Take 0.5 mg by mouth 2 (two) times daily.     Historical Provider, MD  dicyclomine (BENTYL) 20 MG tablet Take 1 tablet by mouth as needed. 08/24/14   Historical  Provider, MD  fish oil-omega-3 fatty acids 1000 MG capsule Take 2 g by mouth 2 (two) times daily.      Historical Provider, MD  furosemide (LASIX) 20 MG tablet Take 0.5 tablets (10 mg total) by mouth every Monday, Wednesday, and Friday. 12/24/14   Skeet Latch, MD  hydrOXYzine (ATARAX/VISTARIL) 25 MG tablet Take 25 mg by mouth 3 (three) times daily as needed for itching.    Historical Provider, MD  levothyroxine (SYNTHROID, LEVOTHROID) 100 MCG tablet Take 100 mcg by mouth daily before breakfast.    Historical Provider, MD  metFORMIN (GLUCOPHAGE) 500 MG tablet Take 1,000 mg by mouth 2 (two) times daily with a meal.     Historical Provider, MD  metoprolol succinate (TOPROL-XL) 25 MG 24 hr tablet Take 12.5 mg by mouth daily.    Historical Provider, MD  Multiple Vitamins-Minerals (MULTIVITAMIN WITH MINERALS) tablet Take 1 tablet by mouth daily.      Historical Provider, MD  nitroGLYCERIN (NITROSTAT) 0.4 MG SL tablet Place 0.4 mg under the tongue every 5 (five) minutes as needed for chest pain.    Historical Provider, MD  nystatin cream (MYCOSTATIN) Use as needed 09/29/14   Historical Provider, MD  pantoprazole (PROTONIX) 40 MG tablet Take 1 tablet by mouth daily. Take 1 tab daily 12/21/14   Historical Provider, MD  potassium chloride SA (K-DUR,KLOR-CON) 20 MEQ tablet Take 20 mEq by mouth 3 (three) times daily.    Historical Provider, MD  simvastatin (ZOCOR) 40 MG tablet Take 20 mg by mouth at bedtime.  10/03/10   Minus Breeding, MD  telmisartan (MICARDIS) 80 MG tablet Take 1 tablet by mouth daily. Take 1 tab daily 12/14/14   Historical Provider, MD  tiZANidine (ZANAFLEX) 4 MG tablet Take 4 mg by mouth 5 (five) times daily as needed for muscle spasms. PAIN    Historical Provider, MD  TOVIAZ 4 MG TB24 tablet Take 4 mg by mouth daily.  05/22/13   Historical Provider, MD  zolpidem (AMBIEN) 10 MG tablet Take 10 mg by mouth at bedtime.     Historical Provider, MD   BP 175/74 mmHg  Pulse 76  Temp(Src) 98.4 F  (36.9 C) (Oral)  Resp 20  SpO2 99% Physical Exam  Constitutional: She is oriented to person, place, and time. She appears well-developed and well-nourished. No distress.  HENT:  Head: Atraumatic.  Eyes: Conjunctivae are normal. No scleral icterus.  Neck: Neck supple. No tracheal deviation present.  Cardiovascular: Normal rate and intact distal pulses.   Pulmonary/Chest: Effort normal and breath sounds normal. No respiratory distress. She exhibits no tenderness.  Abdominal: Normal appearance. She exhibits no distension. There is no tenderness.  Musculoskeletal: She exhibits no edema.  Tenderness left wrist, no focal scaphoid tenderness. Radial pulse 2+. Skin intact. Good rom bil ext, no other focal pain or bony tenderness.   Neurological: She is alert and oriented to person, place, and time.  Left UE/hand, strong grip. Motor and sensory fxn grossly intact.   Skin: Skin is warm and dry. No rash noted. She is not diaphoretic.  Psychiatric: She has a normal mood and affect.  Nursing note and vitals reviewed.   ED Course  Procedures (including critical care time)  Imaging Review Dg Wrist Complete Left  05/13/2015  CLINICAL DATA:  [place] last night and injured left wrist. EXAM: LEFT WRIST - COMPLETE 3+ VIEW COMPARISON:  None. FINDINGS: Moderate to advanced degenerative changes and chondrocalcinosis. Findings could reflect changes of CPPD arthropathy. No acute fractures identified. Widened scapholunate joint space could suggest a ligament tear. IMPRESSION: Moderate to advanced degenerative changes but no acute fracture. Chondrocalcinosis. Widened scapholunate joint space could be due to a ligament tear. Electronically Signed   By: Marijo Sanes M.D.   On: 05/13/2015 11:09   I have personally reviewed and evaluated these images as part of my medical decision-making.    Xrays.   xrays neg. Discussed w pt. Pt requests pain med, ultram po.  Pt requests pain shot. Morphine im (pt has ride, does  not drive).  Pt currently appears stable for d/c.       Lajean Saver, MD 05/13/15 1153

## 2015-05-13 NOTE — ED Notes (Signed)
Off floor for testing 

## 2015-05-18 DIAGNOSIS — E871 Hypo-osmolality and hyponatremia: Secondary | ICD-10-CM | POA: Diagnosis not present

## 2015-05-18 DIAGNOSIS — E785 Hyperlipidemia, unspecified: Secondary | ICD-10-CM | POA: Diagnosis not present

## 2015-05-18 DIAGNOSIS — I10 Essential (primary) hypertension: Secondary | ICD-10-CM | POA: Diagnosis not present

## 2015-06-03 ENCOUNTER — Ambulatory Visit
Admission: RE | Admit: 2015-06-03 | Discharge: 2015-06-03 | Disposition: A | Discharge status: Home / Self Care | Payer: Medicare Other | Source: Ambulatory Visit

## 2015-06-03 DIAGNOSIS — Z1231 Encounter for screening mammogram for malignant neoplasm of breast: Secondary | ICD-10-CM

## 2015-06-07 DIAGNOSIS — E119 Type 2 diabetes mellitus without complications: Secondary | ICD-10-CM | POA: Diagnosis not present

## 2015-06-07 DIAGNOSIS — E871 Hypo-osmolality and hyponatremia: Secondary | ICD-10-CM | POA: Diagnosis not present

## 2015-06-07 DIAGNOSIS — E785 Hyperlipidemia, unspecified: Secondary | ICD-10-CM | POA: Diagnosis not present

## 2015-06-10 DIAGNOSIS — E039 Hypothyroidism, unspecified: Secondary | ICD-10-CM | POA: Diagnosis not present

## 2015-06-10 DIAGNOSIS — E119 Type 2 diabetes mellitus without complications: Secondary | ICD-10-CM | POA: Diagnosis not present

## 2015-06-10 DIAGNOSIS — E871 Hypo-osmolality and hyponatremia: Secondary | ICD-10-CM | POA: Diagnosis not present

## 2015-09-09 DIAGNOSIS — E119 Type 2 diabetes mellitus without complications: Secondary | ICD-10-CM | POA: Diagnosis not present

## 2015-09-09 DIAGNOSIS — E785 Hyperlipidemia, unspecified: Secondary | ICD-10-CM | POA: Diagnosis not present

## 2015-09-09 DIAGNOSIS — I10 Essential (primary) hypertension: Secondary | ICD-10-CM | POA: Diagnosis not present

## 2015-09-09 DIAGNOSIS — E871 Hypo-osmolality and hyponatremia: Secondary | ICD-10-CM | POA: Diagnosis not present

## 2015-09-21 DIAGNOSIS — H40033 Anatomical narrow angle, bilateral: Secondary | ICD-10-CM | POA: Diagnosis not present

## 2015-10-15 DIAGNOSIS — E871 Hypo-osmolality and hyponatremia: Secondary | ICD-10-CM | POA: Diagnosis not present

## 2015-10-15 DIAGNOSIS — I10 Essential (primary) hypertension: Secondary | ICD-10-CM | POA: Diagnosis not present

## 2015-10-26 DIAGNOSIS — E7800 Pure hypercholesterolemia, unspecified: Secondary | ICD-10-CM | POA: Diagnosis not present

## 2015-10-26 DIAGNOSIS — E039 Hypothyroidism, unspecified: Secondary | ICD-10-CM | POA: Diagnosis not present

## 2015-10-26 DIAGNOSIS — I10 Essential (primary) hypertension: Secondary | ICD-10-CM | POA: Diagnosis not present

## 2015-10-26 DIAGNOSIS — E119 Type 2 diabetes mellitus without complications: Secondary | ICD-10-CM | POA: Diagnosis not present

## 2015-11-09 DIAGNOSIS — F418 Other specified anxiety disorders: Secondary | ICD-10-CM | POA: Diagnosis not present

## 2015-12-09 DIAGNOSIS — E785 Hyperlipidemia, unspecified: Secondary | ICD-10-CM | POA: Diagnosis not present

## 2015-12-09 DIAGNOSIS — I10 Essential (primary) hypertension: Secondary | ICD-10-CM | POA: Diagnosis not present

## 2015-12-09 DIAGNOSIS — E119 Type 2 diabetes mellitus without complications: Secondary | ICD-10-CM | POA: Diagnosis not present

## 2015-12-09 DIAGNOSIS — R609 Edema, unspecified: Secondary | ICD-10-CM | POA: Diagnosis not present

## 2015-12-10 ENCOUNTER — Telehealth: Payer: Self-pay | Admitting: *Deleted

## 2015-12-10 NOTE — Telephone Encounter (Signed)
Received refill request from [facility] on Furosemide and verifying directions.  Last time patient seen Rx was for Furosemide 20 mg 1/2 tablet Monday, Wednesday, and Friday only Spoke with patient and she stated Dr Shelia Media her PCP increased to 1/2 tablet daily Left message for [facility] to contact Dr Pennie Banter office for refill

## 2015-12-23 DIAGNOSIS — R609 Edema, unspecified: Secondary | ICD-10-CM | POA: Diagnosis not present

## 2016-01-13 DIAGNOSIS — I10 Essential (primary) hypertension: Secondary | ICD-10-CM | POA: Diagnosis not present

## 2016-01-13 DIAGNOSIS — E871 Hypo-osmolality and hyponatremia: Secondary | ICD-10-CM | POA: Diagnosis not present

## 2016-02-07 DIAGNOSIS — N319 Neuromuscular dysfunction of bladder, unspecified: Secondary | ICD-10-CM | POA: Diagnosis not present

## 2016-02-07 DIAGNOSIS — N3020 Other chronic cystitis without hematuria: Secondary | ICD-10-CM | POA: Diagnosis not present

## 2016-02-07 DIAGNOSIS — N261 Atrophy of kidney (terminal): Secondary | ICD-10-CM | POA: Diagnosis not present

## 2016-02-07 DIAGNOSIS — G8220 Paraplegia, unspecified: Secondary | ICD-10-CM | POA: Diagnosis not present

## 2016-02-08 DIAGNOSIS — R748 Abnormal levels of other serum enzymes: Secondary | ICD-10-CM | POA: Diagnosis not present

## 2016-02-08 DIAGNOSIS — E871 Hypo-osmolality and hyponatremia: Secondary | ICD-10-CM | POA: Diagnosis not present

## 2016-02-08 DIAGNOSIS — I10 Essential (primary) hypertension: Secondary | ICD-10-CM | POA: Diagnosis not present

## 2016-03-23 DIAGNOSIS — E119 Type 2 diabetes mellitus without complications: Secondary | ICD-10-CM | POA: Diagnosis not present

## 2016-03-23 DIAGNOSIS — I10 Essential (primary) hypertension: Secondary | ICD-10-CM | POA: Diagnosis not present

## 2016-03-23 DIAGNOSIS — E785 Hyperlipidemia, unspecified: Secondary | ICD-10-CM | POA: Diagnosis not present

## 2016-04-25 ENCOUNTER — Other Ambulatory Visit: Payer: Self-pay | Admitting: Internal Medicine

## 2016-04-25 DIAGNOSIS — Z1231 Encounter for screening mammogram for malignant neoplasm of breast: Secondary | ICD-10-CM

## 2016-04-26 DIAGNOSIS — Z8371 Family history of colonic polyps: Secondary | ICD-10-CM | POA: Diagnosis not present

## 2016-04-26 DIAGNOSIS — Z1211 Encounter for screening for malignant neoplasm of colon: Secondary | ICD-10-CM | POA: Diagnosis not present

## 2016-05-11 DIAGNOSIS — E119 Type 2 diabetes mellitus without complications: Secondary | ICD-10-CM | POA: Diagnosis not present

## 2016-05-11 DIAGNOSIS — H40033 Anatomical narrow angle, bilateral: Secondary | ICD-10-CM | POA: Diagnosis not present

## 2016-05-11 DIAGNOSIS — H04123 Dry eye syndrome of bilateral lacrimal glands: Secondary | ICD-10-CM | POA: Diagnosis not present

## 2016-05-11 DIAGNOSIS — H2513 Age-related nuclear cataract, bilateral: Secondary | ICD-10-CM | POA: Diagnosis not present

## 2016-05-29 DIAGNOSIS — Z0000 Encounter for general adult medical examination without abnormal findings: Secondary | ICD-10-CM | POA: Diagnosis not present

## 2016-05-29 DIAGNOSIS — G4700 Insomnia, unspecified: Secondary | ICD-10-CM | POA: Diagnosis not present

## 2016-05-29 DIAGNOSIS — K219 Gastro-esophageal reflux disease without esophagitis: Secondary | ICD-10-CM | POA: Diagnosis not present

## 2016-05-29 DIAGNOSIS — E039 Hypothyroidism, unspecified: Secondary | ICD-10-CM | POA: Diagnosis not present

## 2016-06-05 ENCOUNTER — Ambulatory Visit
Admission: RE | Admit: 2016-06-05 | Discharge: 2016-06-05 | Disposition: A | Discharge status: Home / Self Care | Payer: Medicare Other | Source: Ambulatory Visit | Attending: Internal Medicine | Admitting: Internal Medicine

## 2016-06-05 DIAGNOSIS — Z1231 Encounter for screening mammogram for malignant neoplasm of breast: Secondary | ICD-10-CM | POA: Diagnosis not present

## 2016-07-03 DIAGNOSIS — E039 Hypothyroidism, unspecified: Secondary | ICD-10-CM | POA: Diagnosis not present

## 2016-07-03 DIAGNOSIS — M8580 Other specified disorders of bone density and structure, unspecified site: Secondary | ICD-10-CM | POA: Diagnosis not present

## 2016-07-03 DIAGNOSIS — I10 Essential (primary) hypertension: Secondary | ICD-10-CM | POA: Diagnosis not present

## 2016-07-03 DIAGNOSIS — E119 Type 2 diabetes mellitus without complications: Secondary | ICD-10-CM | POA: Diagnosis not present

## 2016-07-03 DIAGNOSIS — E785 Hyperlipidemia, unspecified: Secondary | ICD-10-CM | POA: Diagnosis not present

## 2016-07-30 ENCOUNTER — Encounter (HOSPITAL_COMMUNITY): Payer: Self-pay

## 2016-07-30 ENCOUNTER — Emergency Department (HOSPITAL_COMMUNITY)
Admission: EM | Admit: 2016-07-30 | Discharge: 2016-07-31 | Disposition: A | Discharge status: Home / Self Care | Payer: Medicare Other | Attending: Emergency Medicine | Admitting: Emergency Medicine

## 2016-07-30 ENCOUNTER — Emergency Department (HOSPITAL_COMMUNITY): Payer: Medicare Other

## 2016-07-30 DIAGNOSIS — R0789 Other chest pain: Secondary | ICD-10-CM | POA: Insufficient documentation

## 2016-07-30 DIAGNOSIS — J45909 Unspecified asthma, uncomplicated: Secondary | ICD-10-CM | POA: Diagnosis not present

## 2016-07-30 DIAGNOSIS — I499 Cardiac arrhythmia, unspecified: Secondary | ICD-10-CM | POA: Diagnosis not present

## 2016-07-30 DIAGNOSIS — Z7984 Long term (current) use of oral hypoglycemic drugs: Secondary | ICD-10-CM | POA: Diagnosis not present

## 2016-07-30 DIAGNOSIS — E119 Type 2 diabetes mellitus without complications: Secondary | ICD-10-CM | POA: Insufficient documentation

## 2016-07-30 DIAGNOSIS — I10 Essential (primary) hypertension: Secondary | ICD-10-CM

## 2016-07-30 DIAGNOSIS — I4891 Unspecified atrial fibrillation: Secondary | ICD-10-CM | POA: Diagnosis not present

## 2016-07-30 DIAGNOSIS — I110 Hypertensive heart disease with heart failure: Secondary | ICD-10-CM | POA: Diagnosis not present

## 2016-07-30 DIAGNOSIS — R0602 Shortness of breath: Secondary | ICD-10-CM | POA: Diagnosis not present

## 2016-07-30 DIAGNOSIS — E039 Hypothyroidism, unspecified: Secondary | ICD-10-CM | POA: Insufficient documentation

## 2016-07-30 DIAGNOSIS — Z79899 Other long term (current) drug therapy: Secondary | ICD-10-CM | POA: Insufficient documentation

## 2016-07-30 DIAGNOSIS — I509 Heart failure, unspecified: Secondary | ICD-10-CM | POA: Insufficient documentation

## 2016-07-30 DIAGNOSIS — Z7982 Long term (current) use of aspirin: Secondary | ICD-10-CM | POA: Diagnosis not present

## 2016-07-30 DIAGNOSIS — R079 Chest pain, unspecified: Secondary | ICD-10-CM

## 2016-07-30 LAB — I-STAT TROPONIN, ED: Troponin i, poc: 0 ng/mL (ref 0.00–0.08)

## 2016-07-30 LAB — CBC WITH DIFFERENTIAL/PLATELET
BASOS ABS: 0 10*3/uL (ref 0.0–0.1)
Basophils Relative: 0 %
EOS PCT: 6 %
Eosinophils Absolute: 0.5 10*3/uL (ref 0.0–0.7)
HCT: 40.2 % (ref 36.0–46.0)
Hemoglobin: 13.3 g/dL (ref 12.0–15.0)
LYMPHS PCT: 34 %
Lymphs Abs: 3.4 10*3/uL (ref 0.7–4.0)
MCH: 29.8 pg (ref 26.0–34.0)
MCHC: 33.1 g/dL (ref 30.0–36.0)
MCV: 90.1 fL (ref 78.0–100.0)
MONOS PCT: 8 %
Monocytes Absolute: 0.8 10*3/uL (ref 0.1–1.0)
Neutro Abs: 5.1 10*3/uL (ref 1.7–7.7)
Neutrophils Relative %: 52 %
PLATELETS: 419 10*3/uL — AB (ref 150–400)
RBC: 4.46 MIL/uL (ref 3.87–5.11)
RDW: 14.3 % (ref 11.5–15.5)
WBC: 9.8 10*3/uL (ref 4.0–10.5)

## 2016-07-30 LAB — URINALYSIS, ROUTINE W REFLEX MICROSCOPIC
BILIRUBIN URINE: NEGATIVE
Bacteria, UA: NONE SEEN
GLUCOSE, UA: NEGATIVE mg/dL
HGB URINE DIPSTICK: NEGATIVE
Ketones, ur: NEGATIVE mg/dL
NITRITE: NEGATIVE
PROTEIN: NEGATIVE mg/dL
SPECIFIC GRAVITY, URINE: 1.016 (ref 1.005–1.030)
pH: 5 (ref 5.0–8.0)

## 2016-07-30 LAB — COMPREHENSIVE METABOLIC PANEL
ALT: 51 U/L (ref 14–54)
ANION GAP: 14 (ref 5–15)
AST: 52 U/L — AB (ref 15–41)
Albumin: 3.8 g/dL (ref 3.5–5.0)
Alkaline Phosphatase: 54 U/L (ref 38–126)
BUN: 12 mg/dL (ref 6–20)
CHLORIDE: 104 mmol/L (ref 101–111)
CO2: 22 mmol/L (ref 22–32)
Calcium: 9.4 mg/dL (ref 8.9–10.3)
Creatinine, Ser: 0.53 mg/dL (ref 0.44–1.00)
Glucose, Bld: 86 mg/dL (ref 65–99)
POTASSIUM: 3.6 mmol/L (ref 3.5–5.1)
Sodium: 140 mmol/L (ref 135–145)
Total Bilirubin: 1 mg/dL (ref 0.3–1.2)
Total Protein: 7.1 g/dL (ref 6.5–8.1)

## 2016-07-30 LAB — BRAIN NATRIURETIC PEPTIDE: B NATRIURETIC PEPTIDE 5: 51.7 pg/mL (ref 0.0–100.0)

## 2016-07-30 MED ORDER — NITROGLYCERIN 0.4 MG SL SUBL: 0.4000 mg | SUBLINGUAL_TABLET | SUBLINGUAL | Status: DC | PRN

## 2016-07-30 NOTE — ED Provider Notes (Signed)
[facility] DEPT Provider Note   CSN: [id number] Arrival date & time: 07/30/16  2130     History   Chief Complaint Chief Complaint  Patient presents with  . Shortness of Breath  . Hypertension    HPI [name] is a 71 y.o. female.  HPI Patient presents with several weeks of chest tightness, shortness of breath. States that her blood pressure has been elevated despite medication. She's had bilateral lower extremity swelling. Denies fever or shaking chills. No abdominal pain, nausea or vomiting. States she was previously diagnosed with occipital cardiomyopathy but is no longer followed by cardiology. Past Medical History:  Diagnosis Date  . Angina pectoris ([place]) 08/02/2016  . Anxiety   . Arthritis    "hands, knees, back" (07/18/2013)  . Asthma   . CHF (congestive heart failure) ([place])   . Chronic back pain   . Chronic lower back pain   . Family history of anesthesia complication    Mother and Sister- N/V  . GERD (gastroesophageal reflux disease)   . H/O hiatal hernia   . TLXBWIOM(355.9)    "weekly" (07/18/2013)  . Hyperlipemia   . Hypertension   . Hypothyroidism   . Insomnia   . Kidney stones   . Mental disorder   . Migraine    "years ago" (07/18/2013)  . Osteopenia   . Post-polio syndrome   . Post-polio syndrome   . Sleep apnea   . Takotsubo cardiomyopathy   . Type II diabetes mellitus [facility])     Patient Active Problem List   Diagnosis Date Noted  . Angina pectoris ([place]) 08/02/2016  . Post-polio syndrome 07/20/2013  .          07/20/2013  . Left patella fracture 07/18/2013  . HYPOTHYROIDISM 03/12/2009  . DM 03/12/2009  . HYPERTENSION 03/12/2009  . ATRIAL FIBRILLATION 03/12/2009  . TAKOTSUBO SYNDROME 03/12/2009  . EDEMA 03/12/2009    Past Surgical History:  Procedure Laterality Date  . ABDOMINAL HYSTERECTOMY    . APPENDECTOMY    . CARPAL TUNNEL RELEASE Bilateral   . CHOLECYSTECTOMY    . COLONOSCOPY    . CYSTOSCOPY    . KNEE ARTHROSCOPY Left   .  KNEE SURGERY Left   . LEG SURGERY Left    polio  . ORIF PATELLA Left 07/18/2013   Procedure: OPEN REDUCTION INTERNAL (ORIF) LEFT FIXATION PATELLA;  Surgeon: Augustin Schooling, MD;  Location: [place];  Service: Orthopedics;  Laterality: Left;  . ORIF PATELLA FRACTURE Left 07/18/2013  . TUBAL LIGATION    . WISDOM TOOTH EXTRACTION    . WRIST TENDON TRANSFER Right     OB History    No data available       Home Medications    Prior to Admission medications   Medication Sig Start Date End Date Taking? Authorizing Provider  amLODipine (NORVASC) 5 MG tablet Take 1 tablet by mouth daily. Take 1 tab daily 12/16/14   Historical Provider, MD  aspirin 81 MG tablet Take 81 mg by mouth at bedtime.     Historical Provider, MD  benzonatate (TESSALON) 100 MG capsule Take 100 mg by mouth 3 (three) times daily as needed for cough.    Historical Provider, MD  Calcium Carbonate-Vitamin D (CALTRATE 600+D) 600-400 MG-UNIT per chew tablet Chew 1 tablet by mouth 2 (two) times daily.     Historical Provider, MD  carvedilol (COREG) 12.5 MG tablet Take 1 tablet (12.5 mg total) by mouth 2 (two) times daily. 08/02/16 10/31/16  Tiffany  Oval Linsey, MD  clonazePAM (KLONOPIN) 0.5 MG tablet Take 0.5 mg by mouth 2 (two) times daily.     Historical Provider, MD  fish oil-omega-3 fatty acids 1000 MG capsule Take 2 g by mouth 2 (two) times daily.      Historical Provider, MD  furosemide (LASIX) 20 MG tablet Take 0.5 tablets (10 mg total) by mouth every Monday, Wednesday, and Friday. 12/24/14   Skeet Latch, MD  hydrOXYzine (ATARAX/VISTARIL) 25 MG tablet Take 25 mg by mouth 3 (three) times daily as needed for itching.    Historical Provider, MD  levothyroxine (SYNTHROID, LEVOTHROID) 100 MCG tablet Take 100 mcg by mouth daily before breakfast.    Historical Provider, MD  metFORMIN (GLUCOPHAGE) 500 MG tablet Take 1,000 mg by mouth 2 (two) times daily with a meal.     Historical Provider, MD  mirabegron ER (MYRBETRIQ) 50 MG TB24 tablet  Take 50 mg by mouth daily.    Historical Provider, MD  Multiple Vitamins-Minerals (MULTIVITAMIN WITH MINERALS) tablet Take 1 tablet by mouth daily.      Historical Provider, MD  nitroGLYCERIN (NITROSTAT) 0.4 MG SL tablet Place 0.4 mg under the tongue every 5 (five) minutes as needed for chest pain.    Historical Provider, MD  nystatin cream (MYCOSTATIN) Apply 1 application topically daily as needed for dry skin (irritation. boils on buttocks). Use as needed 09/29/14   Historical Provider, MD  pantoprazole (PROTONIX) 40 MG tablet Take 1 tablet by mouth daily. Take 1 tab daily 12/21/14   Historical Provider, MD  simvastatin (ZOCOR) 40 MG tablet Take 20 mg by mouth at bedtime.  10/03/10   Minus Breeding, MD  telmisartan (MICARDIS) 80 MG tablet Take 1 tablet by mouth daily. Take 1 tab daily 12/14/14   Historical Provider, MD  thiamine (VITAMIN B-1) 100 MG tablet Take 100 mg by mouth daily.    Historical Provider, MD  tiZANidine (ZANAFLEX) 4 MG tablet Take 4 mg by mouth 5 (five) times daily as needed for muscle spasms. PAIN    Historical Provider, MD  traMADol (ULTRAM) 50 MG tablet Take 50 mg by mouth every 6 (six) hours as needed for moderate pain.    Historical Provider, MD  zolpidem (AMBIEN) 10 MG tablet Take 10 mg by mouth at bedtime.     Historical Provider, MD    Family History Family History  Problem Relation Age of Onset  . Heart failure Mother   . Parkinson's disease Mother   . Diabetes Father   . Cancer Father   . Heart failure Father   . Allergies Grandchild     Social History Social History  Substance Use Topics  . Smoking status: Never Smoker  . Smokeless tobacco: Never Used  . Alcohol use No     Allergies   Ciprofloxacin; Acetaminophen; Aspirin; Nsaids; Penicillins; Percocet [oxycodone-acetaminophen]; Propoxyphene n-acetaminophen; Soma compound [carisoprodol-aspirin]; Sulfa antibiotics; Tolectin [tolmetin]; and Macrodantin [nitrofurantoin macrocrystal]   Review of  Systems Review of Systems  Constitutional: Negative for chills and fever.  Respiratory: Positive for chest tightness and shortness of breath. Negative for cough and wheezing.   Cardiovascular: Positive for palpitations and leg swelling. Negative for chest pain.  Gastrointestinal: Negative for abdominal pain, diarrhea, nausea and vomiting.  Musculoskeletal: Negative for back pain, myalgias, neck pain and neck stiffness.  Skin: Negative for rash and wound.  Neurological: Negative for dizziness, weakness, light-headedness, numbness and headaches.  All other systems reviewed and are negative.    Physical Exam Updated Vital Signs BP Marland Kitchen)  176/53   Pulse 71   Temp 97.5 F (36.4 C) (Oral)   Resp 19   Ht 5' (1.524 m)   Wt 170 lb (77.1 kg)   SpO2 99%   BMI 33.20 kg/m   Physical Exam  Constitutional: She is oriented to person, place, and time. She appears well-developed and well-nourished.  Appears anxious  HENT:  Head: Normocephalic and atraumatic.  Mouth/Throat: Oropharynx is clear and moist. No oropharyngeal exudate.  Eyes: EOM are normal. Pupils are equal, round, and reactive to light.  Neck: Normal range of motion. Neck supple.  Cardiovascular: Normal rate and regular rhythm.  Exam reveals no gallop and no friction rub.   No murmur heard. Pulmonary/Chest: Effort normal and breath sounds normal. No respiratory distress. She has no wheezes. She has no rales. She exhibits no tenderness.  Mildly diminished breath sounds bilateral bases.  Abdominal: Soft. Bowel sounds are normal. There is no tenderness. There is no rebound and no guarding.  Musculoskeletal: Normal range of motion. She exhibits edema. She exhibits no tenderness.  1+ bilateral lower sugary pitting edema.  Lymphadenopathy:    She has no cervical adenopathy.  Neurological: She is alert and oriented to person, place, and time.  Moving all extremities without deficit. Sensation intact.  Skin: Skin is warm and dry. No rash  noted. No erythema.  Psychiatric: She has a normal mood and affect. Her behavior is normal.  Nursing note and vitals reviewed.    ED Treatments / Results  Labs (all labs ordered are listed, but only abnormal results are displayed) Labs Reviewed  URINE CULTURE - Abnormal; Notable for the following:       Result Value   Culture MULTIPLE SPECIES PRESENT, SUGGEST RECOLLECTION (*)    All other components within normal limits  CBC WITH DIFFERENTIAL/PLATELET - Abnormal; Notable for the following:    Platelets 419 (*)    All other components within normal limits  COMPREHENSIVE METABOLIC PANEL - Abnormal; Notable for the following:    AST 52 (*)    All other components within normal limits  URINALYSIS, ROUTINE W REFLEX MICROSCOPIC - Abnormal; Notable for the following:    Color, Urine AMBER (*)    APPearance CLOUDY (*)    Leukocytes, UA TRACE (*)    Squamous Epithelial / LPF 0-5 (*)    All other components within normal limits  BRAIN NATRIURETIC PEPTIDE  I-STAT TROPOININ, ED  I-STAT TROPOININ, ED    EKG  EKG Interpretation  Date/Time:  Sunday July 30 2016 22:45:05 EDT Ventricular Rate:  66 PR Interval:  150 QRS Duration: 68 QT Interval:  428 QTC Calculation: 448 R Axis:   0 Text Interpretation:  Normal sinus rhythm Nonspecific ST abnormality Abnormal ECG Confirmed by Lita Mains  MD, Koty Anctil ([id number]) on 07/30/2016 11:43:08 PM Also confirmed by Lita Mains  MD, Patric Buckhalter ([id number]), editor [place]  CCT, [place]LY ([id number])  on 07/31/2016 7:02:21 AM       Radiology No results found.  Procedures Procedures (including critical care time)  Medications Ordered in ED Medications - No data to display   Initial Impression / Assessment and Plan / ED Course  I have reviewed the triage vital signs and the nursing notes.  Pertinent labs & imaging results that were available during my care of the patient were reviewed by me and considered in my medical decision making (see chart for details).      Initial troponin is normal. EKG without any ischemic findings. Chest pain sounds atypical for coronary syndrome.  Signed out to oncoming emergency physician pending repeat troponin.  Final Clinical Impressions(s) / ED Diagnoses   Final diagnoses:  Essential hypertension  Chest pain, unspecified type    New Prescriptions Discharge Medication List as of 07/31/2016  2:51 AM       Julianne Rice, MD 08/02/16 2133

## 2016-07-30 NOTE — ED Notes (Signed)
Portable xray at bedside.

## 2016-07-30 NOTE — ED Triage Notes (Signed)
Pt from home with [facility] c/o SOB with intermittent CP for the past 5 days. Pt sts she starting feeling sick about a month ago but waited to come in. Pt a fib with bigeminy on the monitor. Pt took 324 ASA prior to arrival. Pt also hypertensive with EMS 200/110 and is taking multiple medications for her BP. Sts she has not taken her night dose. Other VSS, NAD at this time

## 2016-07-30 NOTE — ED Notes (Signed)
Unable to draw blood from IV for blood work. Santiago Glad from phlebotomy at bedside to draw blood

## 2016-07-31 LAB — I-STAT TROPONIN, ED: TROPONIN I, POC: 0.02 ng/mL (ref 0.00–0.08)

## 2016-07-31 NOTE — ED Provider Notes (Signed)
Patient signed out to me to repeat troponin. Patient presented with shortness of breath and intermittent tightness of chest, felt to be atypical for cardiac etiology. Patient felt to be low risk for acute coronary syndrome. Initial workup was negative. Second troponin has returned to normal as well. Patient is felt to be safe for discharge and prompt follow-up with primary care.   Orpah Greek, MD 07/31/16 [phone]

## 2016-07-31 NOTE — ED Notes (Signed)
Pt provided with decaf coffee and [place] sandwich

## 2016-08-01 ENCOUNTER — Telehealth: Payer: Self-pay | Admitting: Cardiovascular Disease

## 2016-08-01 LAB — URINE CULTURE

## 2016-08-01 NOTE — Telephone Encounter (Signed)
Received a call from patient's husband.He stated wife had episode of chest pain and elevated B/P this past Sunday.B/P 193/103.Stated was taken to ED.Several test done was told to see Dr.Lolo.Wife feels better today no chest pain B/P 115/70.Advised to keep appointment with Dr. 08/02/16 at 11:30 am.

## 2016-08-02 ENCOUNTER — Ambulatory Visit (INDEPENDENT_AMBULATORY_CARE_PROVIDER_SITE_OTHER): Payer: Medicare Other | Admitting: Cardiovascular Disease

## 2016-08-02 ENCOUNTER — Encounter: Payer: Self-pay | Admitting: Cardiovascular Disease

## 2016-08-02 ENCOUNTER — Telehealth: Payer: Self-pay | Admitting: Cardiovascular Disease

## 2016-08-02 VITALS — BP 106/59 | HR 53 | Ht 60.0 in | Wt 172.0 lb

## 2016-08-02 DIAGNOSIS — I209 Angina pectoris, unspecified: Secondary | ICD-10-CM | POA: Diagnosis not present

## 2016-08-02 DIAGNOSIS — R079 Chest pain, unspecified: Secondary | ICD-10-CM | POA: Diagnosis not present

## 2016-08-02 DIAGNOSIS — I10 Essential (primary) hypertension: Secondary | ICD-10-CM | POA: Diagnosis not present

## 2016-08-02 DIAGNOSIS — E7800 Pure hypercholesterolemia, unspecified: Secondary | ICD-10-CM

## 2016-08-02 HISTORY — DX: Angina pectoris, unspecified: I20.9

## 2016-08-02 MED ORDER — CARVEDILOL 12.5 MG PO TABS: 12.5000 mg | ORAL_TABLET | Freq: Two times a day (BID) | ORAL | 3 refills | Status: DC

## 2016-08-02 NOTE — Patient Instructions (Signed)
Medication Instructions: Please stop taking Metoprolol Please start taking Carvedilol, 12.5 mg,  twice daily with meals.    Procedures/Testing: Your physician has requested that you have a lexiscan myoview. For further information please visit [email or web address]. Please follow instruction sheet, as given. This will be done at [place], [address]    Follow-Up: Your physician recommends that you schedule a follow-up appointment in: one month with Dr. Oval Linsey   Any Additional Special Instructions Will Be Listed Below (If Applicable).     If you need a refill on your cardiac medications before your next appointment, please call your pharmacy.

## 2016-08-02 NOTE — Telephone Encounter (Signed)
Attempted to return call to patient. Phone did not ring, just beeped as if disconnected.   MD med instructions per visit today 3/21 as follows:  # Hypertension: BP is well-controlled today but it is been elevated at home and was high in the hospital. Heart rate has been low. We will stop metoprolol and start carvedilol 12.5 mg twice daily.  Continue amlodipine, telmisartan and lasix.  I have asked her to keep a log of her blood pressures and bring to follow-up.

## 2016-08-02 NOTE — Telephone Encounter (Signed)
New message    Pt was sent a sheet home to stop amLODipine (NORVASC) 5 MG tablet and start carvedilol (COREG) 12.5 MG tablet, but she said the amLODipine (NORVASC) 5 MG tablet is still on the sheet so she wants to know if she needs to take it or not. Requests a call back.

## 2016-08-02 NOTE — Progress Notes (Signed)
Cardiology Office Note   Date:  08/02/2016   ID:  [name], DOB November 07, 1945, MRN [id number]  PCP:  Horatio Pel, MD  Cardiologist:   Skeet Latch, MD   No chief complaint on file.     History of Present Illness: [name] is a 71 y.o. female with HTN, atrial fibrillation, Takotsubo syndrome, DM, and hypothyroidism who presents for follow up. [name] was first seen 12/2014 for hypertension and atypical chest pain.  At that appointment her blood pressure was actually controlled. Furosemide 3 times weekly was added to her regimen.  She also reported atypical chest pain and was referred for [facility] 12/31/14 that revealed LVEF 71% with no evidence of ischemia was seen in the ED 07/30/16.  [name] was seen in the ED 07/2016 with chest tightness and poorly controlled blood pressure. Cardiac enzymes were negative. Her EKG showed nonspecific ST-T changes. She was instructed to follow-up with cardiology as an outpatient.   [name] reported 3-4 weeks of intermittent chest pain and shortness of breath.  This occurs 1-2 times per week and lasts for several time.  It varies in severity from mild to 10 out of 10. She notes shortness in the left side of her chest that radiates into her left arm. There is associated nausea and diaphoresis. She also notes a pulling sensation in her right chest and arms that she attributes to lifting herself out of the wheelchair. She does not get any exercise. She had polio as a child and is now unable to walk. She does have a history of gastroesophageal reflux disease but this feels different than her typical GERD symptoms.  She sometimes notes lower extremity edema and swelling. She sleeps in a hospital bed and her head is always elevated. This is unchanged from her baseline. She takes her blood pressure medication regularly. She brings a log of her blood pressures that she was mostly running in the 025K to 270W systolic. Yesterday was  115/53. Program  Past Medical History:  Diagnosis Date  . Angina pectoris ([place]) 08/02/2016  . Anxiety   . Arthritis    "hands, knees, back" (07/18/2013)  . Asthma   . CHF (congestive heart failure) ([place])   . Chronic back pain   . Chronic lower back pain   . Family history of anesthesia complication    Mother and Sister- N/V  . GERD (gastroesophageal reflux disease)   . H/O hiatal hernia   . CBJSEGBT(517.6)    "weekly" (07/18/2013)  . Hyperlipemia   . Hypertension   . Hypothyroidism   . Insomnia   . Kidney stones   . Mental disorder   . Migraine    "years ago" (07/18/2013)  . Osteopenia   . Post-polio syndrome   . Post-polio syndrome   . Sleep apnea   . Takotsubo cardiomyopathy   . Type II diabetes mellitus ([place])     Past Surgical History:  Procedure Laterality Date  . ABDOMINAL HYSTERECTOMY    . APPENDECTOMY    . CARPAL TUNNEL RELEASE Bilateral   . CHOLECYSTECTOMY    . COLONOSCOPY    . CYSTOSCOPY    . KNEE ARTHROSCOPY Left   . KNEE SURGERY Left   . LEG SURGERY Left    polio  . ORIF PATELLA Left 07/18/2013   Procedure: OPEN REDUCTION INTERNAL (ORIF) LEFT FIXATION PATELLA;  Surgeon: Augustin Schooling, MD;  Location: [place];  Service: Orthopedics;  Laterality: Left;  . ORIF PATELLA FRACTURE Left 07/18/2013  .  TUBAL LIGATION    . WISDOM TOOTH EXTRACTION    . WRIST TENDON TRANSFER Right      Current Outpatient Prescriptions  Medication Sig Dispense Refill  . amLODipine (NORVASC) 5 MG tablet Take 1 tablet by mouth daily. Take 1 tab daily  0  . aspirin 81 MG tablet Take 81 mg by mouth at bedtime.     . benzonatate (TESSALON) 100 MG capsule Take 100 mg by mouth 3 (three) times daily as needed for cough.    . Calcium Carbonate-Vitamin D (CALTRATE 600+D) 600-400 MG-UNIT per chew tablet Chew 1 tablet by mouth 2 (two) times daily.     . clonazePAM (KLONOPIN) 0.5 MG tablet Take 0.5 mg by mouth 2 (two) times daily.     . fish oil-omega-3 fatty acids 1000 MG capsule Take 2 g by  mouth 2 (two) times daily.      . furosemide (LASIX) 20 MG tablet Take 0.5 tablets (10 mg total) by mouth every Monday, Wednesday, and Friday. 36 tablet 3  . hydrOXYzine (ATARAX/VISTARIL) 25 MG tablet Take 25 mg by mouth 3 (three) times daily as needed for itching.    . levothyroxine (SYNTHROID, LEVOTHROID) 100 MCG tablet Take 100 mcg by mouth daily before breakfast.    . metFORMIN (GLUCOPHAGE) 500 MG tablet Take 1,000 mg by mouth 2 (two) times daily with a meal.     . mirabegron ER (MYRBETRIQ) 50 MG TB24 tablet Take 50 mg by mouth daily.    . Multiple Vitamins-Minerals (MULTIVITAMIN WITH MINERALS) tablet Take 1 tablet by mouth daily.      . nitroGLYCERIN (NITROSTAT) 0.4 MG SL tablet Place 0.4 mg under the tongue every 5 (five) minutes as needed for chest pain.    Marland Kitchen nystatin cream (MYCOSTATIN) Apply 1 application topically daily as needed for dry skin (irritation. boils on buttocks). Use as needed  0  . pantoprazole (PROTONIX) 40 MG tablet Take 1 tablet by mouth daily. Take 1 tab daily  98  . simvastatin (ZOCOR) 40 MG tablet Take 20 mg by mouth at bedtime.     Marland Kitchen telmisartan (MICARDIS) 80 MG tablet Take 1 tablet by mouth daily. Take 1 tab daily  0  . thiamine (VITAMIN B-1) 100 MG tablet Take 100 mg by mouth daily.    Marland Kitchen tiZANidine (ZANAFLEX) 4 MG tablet Take 4 mg by mouth 5 (five) times daily as needed for muscle spasms. PAIN    . traMADol (ULTRAM) 50 MG tablet Take 50 mg by mouth every 6 (six) hours as needed for moderate pain.    Marland Kitchen zolpidem (AMBIEN) 10 MG tablet Take 10 mg by mouth at bedtime.     . carvedilol (COREG) 12.5 MG tablet Take 1 tablet (12.5 mg total) by mouth 2 (two) times daily. 180 tablet 3   No current facility-administered medications for this visit.     Allergies:   Ciprofloxacin; Acetaminophen; Aspirin; Nsaids; Penicillins; Percocet [oxycodone-acetaminophen]; Propoxyphene n-acetaminophen; Soma compound [carisoprodol-aspirin]; Sulfa antibiotics; Tolectin [tolmetin]; and  Macrodantin [nitrofurantoin macrocrystal]    Social History:  The patient  reports that she has never smoked. She has never used smokeless tobacco. She reports that she does not drink alcohol or use drugs.   Family History:  The patient's family history includes Allergies in her grandchild; Cancer in her father; Diabetes in her father; Heart failure in her father and mother; Parkinson's disease in her mother.    ROS:  Please see the history of present illness.   Otherwise, review of systems  are positive for none.   All other systems are reviewed and negative.    PHYSICAL EXAM: VS:  BP (!) 106/59   Pulse (!) 53   Ht 5' (1.524 m)   Wt 78 kg (172 lb)   BMI 33.59 kg/m  , BMI Body mass index is 33.59 kg/m. GENERAL:  Well appearing.  Obese.  Exam performed as patient sat in her scooter HEENT:  Pupils equal round and reactive, fundi not visualized, oral mucosa unremarkable NECK:  No jugular venous distention, waveform within normal limits, carotid upstroke brisk and symmetric, no bruits LYMPHATICS:  No cervical adenopathy LUNGS:  Clear to auscultation bilaterally HEART:  RRR.  PMI not displaced or sustained,S1 and S2 within normal limits, no S3, no S4, no clicks, no rubs, no murmurs ABD:  Flat, positive bowel sounds normal in frequency in pitch, no bruits, no rebound, no guarding, no midline pulsatile mass, no hepatomegaly, no splenomegaly EXT:  2 plus pulses throughout, no edema, no cyanosis no clubbing SKIN:  No rashes no nodules NEURO:  Cranial nerves II through XII grossly intact, motor grossly intact throughout PSYCH:  Cognitively intact, oriented to person place and time    EKG:  EKG is ordered today. The ekg ordered today demonstrates sinus rhythm.  61 bpm.  L axis deviation.  Low voltage.  07/31/16: Sinus rhythm. Rate 66.  Recent Labs: 07/30/2016: ALT 51; B Natriuretic Peptide 51.7; BUN 12; Creatinine, Ser 0.53; Hemoglobin 13.3; Platelets 419; Potassium 3.6; Sodium 140     Lipid Panel No results found for: CHOL, TRIG, HDL, CHOLHDL, VLDL, LDLCALC, LDLDIRECT  11/23/14: Chol 111, tri 106, hdl 42, ldl 48 Hgb A1c 5.5   Wt Readings from Last 3 Encounters:  08/02/16 78 kg (172 lb)  07/30/16 77.1 kg (170 lb)  12/31/14 72.6 kg (160 lb)    TTE 01/31/10: EF 55-60%.  No wall motion abnormalities.  Grade 1 diastolic dysfunction.  Calcification of anterior mitral chordae.  Mildly elevated PASP.  Lexiscan Myoview 12/31/14:   The left ventricular ejection fraction is hyperdynamic (>65%).  Nuclear stress EF: 71%.  There was no ST segment deviation noted during stress.  The study is normal.   Normal stress nuclear study with no ischemia or infarction; EF 71 with normal wall motion.  Other studies Reviewed: Additional studies/ records that were reviewed today include: medical record. Review of the above records demonstrates:  Please see elsewhere in the note.     ASSESSMENT AND PLAN:  # Hypertension: BP is well-controlled today but it is been elevated at home and was high in the hospital. Heart rate has been low. We will stop metoprolol and start carvedilol 12.5 mg twice daily.  Continue amlodipine, telmisartan and lasix.  I have asked her to keep a log of her blood pressures and bring to follow-up.  # Chest pain: Ms. Leite has recurrent chest pain. She is unable to walk so it is difficult to determine whether she has exertional symptoms. We will obtain a Lexiscan Myoview. Continue aspirin, statin and start carvedilol as above. If this is normal, her symptoms are likely due to gastroesophageal reflux disease and we will increase her pantoprazole to twice daily.    # Hyperlipidemia:  She is due for lipids. We'll check this at follow-up. Continue simvastatin.   # Takotsubo cardiomyopathy: Resolved.   Current medicines are reviewed at length with the patient today.  The patient does not have concerns regarding medicines.  The following changes have been made:   See  above  Labs/ tests ordered today include:    Orders Placed This Encounter  Procedures  . Myocardial Perfusion Imaging     Disposition:   FU with Dr. Jonelle Sidle C. [place] in 1 month  Signed, Skeet Latch, MD  08/02/2016 1:41 PM    [place]

## 2016-08-04 NOTE — Telephone Encounter (Signed)
Spoke with patient and clarified that she should discontinue the metoprolol and start the carvedilol 12.5 mg twice daily. She should also keep taking the amlodipine. The patient stated that she understood.

## 2016-08-10 ENCOUNTER — Telehealth (HOSPITAL_COMMUNITY): Payer: Self-pay

## 2016-08-10 NOTE — Telephone Encounter (Signed)
Encounter complete. 

## 2016-08-11 ENCOUNTER — Telehealth (HOSPITAL_COMMUNITY): Payer: Self-pay

## 2016-08-11 NOTE — Telephone Encounter (Signed)
Encounter complete. 

## 2016-08-15 ENCOUNTER — Ambulatory Visit (HOSPITAL_COMMUNITY)
Admission: RE | Admit: 2016-08-15 | Discharge: 2016-08-15 | Disposition: A | Discharge status: Home / Self Care | Payer: Medicare Other | Source: Ambulatory Visit | Attending: Cardiovascular Disease | Admitting: Cardiovascular Disease

## 2016-08-15 DIAGNOSIS — R079 Chest pain, unspecified: Secondary | ICD-10-CM | POA: Insufficient documentation

## 2016-08-15 LAB — MYOCARDIAL PERFUSION IMAGING
CHL CUP RESTING HR STRESS: 56 {beats}/min
LVDIAVOL: 80 mL (ref 46–106)
LVSYSVOL: 26 mL
NUC STRESS TID: 1.09
Peak HR: 78 {beats}/min
SDS: 6
SRS: 0
SSS: 6

## 2016-08-15 MED ORDER — REGADENOSON 0.4 MG/5ML IV SOLN
0.4000 mg | Freq: Once | INTRAVENOUS | Status: AC
  Administered 2016-08-15: 0.4 mg via INTRAVENOUS

## 2016-08-15 MED ORDER — TECHNETIUM TC 99M TETROFOSMIN IV KIT
10.9000 | PACK | Freq: Once | INTRAVENOUS | Status: AC | PRN
  Administered 2016-08-15: 10.9 via INTRAVENOUS
  Filled 2016-08-15: qty 11

## 2016-08-15 MED ORDER — AMINOPHYLLINE 25 MG/ML IV SOLN
75.0000 mg | Freq: Once | INTRAVENOUS | Status: AC
  Administered 2016-08-15: 75 mg via INTRAVENOUS

## 2016-08-15 MED ORDER — TECHNETIUM TC 99M TETROFOSMIN IV KIT
32.1000 | PACK | Freq: Once | INTRAVENOUS | Status: AC | PRN
  Administered 2016-08-15: 32.1 via INTRAVENOUS
  Filled 2016-08-15: qty 33

## 2016-08-18 ENCOUNTER — Telehealth: Payer: Self-pay | Admitting: *Deleted

## 2016-08-18 DIAGNOSIS — R9439 Abnormal result of other cardiovascular function study: Secondary | ICD-10-CM

## 2016-08-18 NOTE — Telephone Encounter (Signed)
-----   Message from Skeet Latch, MD sent at 08/17/2016  2:35 PM EDT ----- Stress test was mildly abnormal.  Recommended getting a cardiac CT-A to better evaluate her coronary arteries and see if her chest pain is coming from her heart.

## 2016-08-18 NOTE — Telephone Encounter (Signed)
pt aware of results, orders placed and sent to precert and admin for scheduling.

## 2016-08-21 ENCOUNTER — Encounter: Payer: Self-pay | Admitting: Cardiovascular Disease

## 2016-08-25 DIAGNOSIS — M6281 Muscle weakness (generalized): Secondary | ICD-10-CM | POA: Diagnosis not present

## 2016-08-28 ENCOUNTER — Ambulatory Visit: Payer: Medicare Other | Admitting: Cardiovascular Disease

## 2016-08-29 ENCOUNTER — Ambulatory Visit (HOSPITAL_COMMUNITY)
Admission: RE | Admit: 2016-08-29 | Discharge: 2016-08-29 | Disposition: A | Discharge status: Home / Self Care | Payer: Medicare Other | Source: Ambulatory Visit | Attending: Cardiovascular Disease | Admitting: Cardiovascular Disease

## 2016-08-29 ENCOUNTER — Encounter (HOSPITAL_COMMUNITY): Payer: Self-pay

## 2016-08-29 DIAGNOSIS — I2510 Atherosclerotic heart disease of native coronary artery without angina pectoris: Secondary | ICD-10-CM | POA: Insufficient documentation

## 2016-08-29 DIAGNOSIS — I700 Atherosclerosis of aorta: Secondary | ICD-10-CM | POA: Insufficient documentation

## 2016-08-29 DIAGNOSIS — R9439 Abnormal result of other cardiovascular function study: Secondary | ICD-10-CM

## 2016-08-29 DIAGNOSIS — R079 Chest pain, unspecified: Secondary | ICD-10-CM | POA: Diagnosis not present

## 2016-08-29 MED ORDER — IOPAMIDOL (ISOVUE-370) INJECTION 76%
INTRAVENOUS | Status: AC
  Administered 2016-08-29: 80 mL
  Filled 2016-08-29: qty 100

## 2016-08-29 MED ORDER — NITROGLYCERIN 0.4 MG SL SUBL
SUBLINGUAL_TABLET | SUBLINGUAL | Status: AC
  Administered 2016-08-29: 0.8 mg via SUBLINGUAL
  Filled 2016-08-29: qty 2

## 2016-08-29 MED ORDER — NITROGLYCERIN 0.4 MG SL SUBL
0.8000 mg | SUBLINGUAL_TABLET | Freq: Once | SUBLINGUAL | Status: AC
  Administered 2016-08-29: 0.8 mg via SUBLINGUAL

## 2016-08-29 MED ORDER — METOPROLOL TARTRATE 5 MG/5ML IV SOLN
INTRAVENOUS | Status: AC
  Administered 2016-08-29: 5 mg via INTRAVENOUS
  Filled 2016-08-29: qty 5

## 2016-08-29 MED ORDER — METOPROLOL TARTRATE 5 MG/5ML IV SOLN
5.0000 mg | INTRAVENOUS | Status: AC
  Administered 2016-08-29: 5 mg via INTRAVENOUS

## 2016-08-31 ENCOUNTER — Ambulatory Visit (INDEPENDENT_AMBULATORY_CARE_PROVIDER_SITE_OTHER): Payer: Medicare Other | Admitting: Cardiovascular Disease

## 2016-08-31 ENCOUNTER — Encounter: Payer: Self-pay | Admitting: Cardiovascular Disease

## 2016-08-31 VITALS — BP 102/60 | HR 64 | Ht 60.0 in | Wt 161.0 lb

## 2016-08-31 DIAGNOSIS — E7800 Pure hypercholesterolemia, unspecified: Secondary | ICD-10-CM | POA: Diagnosis not present

## 2016-08-31 DIAGNOSIS — R9439 Abnormal result of other cardiovascular function study: Secondary | ICD-10-CM | POA: Diagnosis not present

## 2016-08-31 DIAGNOSIS — I10 Essential (primary) hypertension: Secondary | ICD-10-CM | POA: Diagnosis not present

## 2016-08-31 DIAGNOSIS — R079 Chest pain, unspecified: Secondary | ICD-10-CM | POA: Diagnosis not present

## 2016-08-31 NOTE — Patient Instructions (Signed)
Medication Instructions:  Your physician recommends that you continue on your current medications as directed. Please refer to the Current Medication list given to you today.  Labwork: none  Testing/Procedures: none  Follow-Up: Your physician wants you to follow-up in: 6 months ov You will receive a reminder letter in the mail two months in advance. If you don't receive a letter, please call our office to schedule the follow-up appointment.  If you need a refill on your cardiac medications before your next appointment, please call your pharmacy.

## 2016-08-31 NOTE — Progress Notes (Signed)
Cardiology Office Note   Date:  08/31/2016   ID:  [name], DOB 10/29/1945, MRN [id number]  PCP:  Horatio Pel, MD  Cardiologist:   Skeet Latch, MD   Chief Complaint  Patient presents with  . Follow-up    1 MONTH      History of Present Illness: [name] is a 71 y.o. female with HTN, atrial fibrillation, Takotsubo syndrome, DM, post-Polio syndrome, and hypothyroidism who presents for follow up. [name] was first seen 12/2014 for hypertension and atypical chest pain.  At that appointment her blood pressure was actually controlled. Furosemide 3 times weekly was added to her regimen.  She also reported atypical chest pain and was referred for [facility] 12/31/14 that revealed LVEF 71% with no evidence of ischemia was seen in the ED 07/30/16.  [name] was seen in the ED 07/2016 with chest tightness and poorly controlled blood pressure. Cardiac enzymes were negative. Her EKG showed nonspecific ST-T changes. She was instructed to follow-up with cardiology as an outpatient.  She had a Lexiscan Myoview 08/15/16 that revealed LVEF 67% with concern for basal to mid inferior ischemia.  She subsequently had a cardiac CT-A that revealed 30% LAD stenosis and no obstructive disease.  At her last appointment metoprolol was switched to carvedilol due to high blood pressures and low heart rates.  Since that time her blood pressure has been much better-controlled. She continues to have intermittent episodes of sharp chest pain. At times she also feel short of breath but denies lower extremity edema, orthopnea, or PND.   Past Medical History:  Diagnosis Date  . Angina pectoris ([place]) 08/02/2016  . Anxiety   . Arthritis    "hands, knees, back" (07/18/2013)  . Asthma   . CHF (congestive heart failure) ([place])   . Chronic back pain   . Chronic lower back pain   . Family history of anesthesia complication    Mother and Sister- N/V  . GERD (gastroesophageal reflux disease)   . H/O  hiatal hernia   . OIZTIWPY(099.8)    "weekly" (07/18/2013)  . Hyperlipemia   . Hypertension   . Hypothyroidism   . Insomnia   . Kidney stones   . Mental disorder   . Migraine    "years ago" (07/18/2013)  . Osteopenia   . Post-polio syndrome   . Post-polio syndrome   . Sleep apnea   . Takotsubo cardiomyopathy   . Type II diabetes mellitus ([place])     Past Surgical History:  Procedure Laterality Date  . ABDOMINAL HYSTERECTOMY    . APPENDECTOMY    . CARPAL TUNNEL RELEASE Bilateral   . CHOLECYSTECTOMY    . COLONOSCOPY    . CYSTOSCOPY    . KNEE ARTHROSCOPY Left   . KNEE SURGERY Left   . LEG SURGERY Left    polio  . ORIF PATELLA Left 07/18/2013   Procedure: OPEN REDUCTION INTERNAL (ORIF) LEFT FIXATION PATELLA;  Surgeon: Augustin Schooling, MD;  Location: [place];  Service: Orthopedics;  Laterality: Left;  . ORIF PATELLA FRACTURE Left 07/18/2013  . TUBAL LIGATION    . WISDOM TOOTH EXTRACTION    . WRIST TENDON TRANSFER Right      Current Outpatient Prescriptions  Medication Sig Dispense Refill  . amLODipine (NORVASC) 5 MG tablet Take 1 tablet by mouth daily. Take 1 tab daily  0  . aspirin 81 MG tablet Take 81 mg by mouth at bedtime.     . benzonatate (TESSALON) 100  MG capsule Take 100 mg by mouth 3 (three) times daily as needed for cough.    . Calcium Carbonate-Vitamin D (CALTRATE 600+D) 600-400 MG-UNIT per chew tablet Chew 1 tablet by mouth 2 (two) times daily.     . carvedilol (COREG) 12.5 MG tablet Take 1 tablet (12.5 mg total) by mouth 2 (two) times daily. 180 tablet 3  . clonazePAM (KLONOPIN) 0.5 MG tablet Take 0.5 mg by mouth 2 (two) times daily.     . fish oil-omega-3 fatty acids 1000 MG capsule Take 2 g by mouth 2 (two) times daily.      . furosemide (LASIX) 20 MG tablet Take 0.5 tablets (10 mg total) by mouth every Monday, Wednesday, and Friday. 36 tablet 3  . hydrOXYzine (ATARAX/VISTARIL) 25 MG tablet Take 25 mg by mouth 3 (three) times daily as needed for itching.    .  levothyroxine (SYNTHROID, LEVOTHROID) 100 MCG tablet Take 100 mcg by mouth daily before breakfast.    . metFORMIN (GLUCOPHAGE) 500 MG tablet Take 1,000 mg by mouth 2 (two) times daily with a meal.     . mirabegron ER (MYRBETRIQ) 50 MG TB24 tablet Take 50 mg by mouth daily.    . Multiple Vitamins-Minerals (MULTIVITAMIN WITH MINERALS) tablet Take 1 tablet by mouth daily.      . nitroGLYCERIN (NITROSTAT) 0.4 MG SL tablet Place 0.4 mg under the tongue every 5 (five) minutes as needed for chest pain.    Marland Kitchen nystatin cream (MYCOSTATIN) Apply 1 application topically daily as needed for dry skin (irritation. boils on buttocks). Use as needed  0  . pantoprazole (PROTONIX) 40 MG tablet Take 1 tablet by mouth daily. Take 1 tab daily  98  . simvastatin (ZOCOR) 40 MG tablet Take 20 mg by mouth at bedtime.     Marland Kitchen telmisartan (MICARDIS) 80 MG tablet Take 1 tablet by mouth daily. Take 1 tab daily  0  . thiamine (VITAMIN B-1) 100 MG tablet Take 100 mg by mouth daily.    Marland Kitchen tiZANidine (ZANAFLEX) 4 MG tablet Take 4 mg by mouth 5 (five) times daily as needed for muscle spasms. PAIN    . traMADol (ULTRAM) 50 MG tablet Take 50 mg by mouth every 6 (six) hours as needed for moderate pain.    Marland Kitchen zolpidem (AMBIEN) 10 MG tablet Take 10 mg by mouth at bedtime.      No current facility-administered medications for this visit.     Allergies:   Ciprofloxacin; Acetaminophen; Aspirin; Nsaids; Penicillins; Percocet [oxycodone-acetaminophen]; Propoxyphene n-acetaminophen; Soma compound [carisoprodol-aspirin]; Sulfa antibiotics; Tolectin [tolmetin]; and Macrodantin [nitrofurantoin macrocrystal]    Social History:  The patient  reports that she has never smoked. She has never used smokeless tobacco. She reports that she does not drink alcohol or use drugs.   Family History:  The patient's family history includes Allergies in her grandchild; Cancer in her father; Diabetes in her father; Heart failure in her father and mother;  Parkinson's disease in her mother.    ROS:  Please see the history of present illness.   Otherwise, review of systems are positive for none.   All other systems are reviewed and negative.    PHYSICAL EXAM: VS:  BP 102/60   Pulse 64   Ht 5' (1.524 m)   Wt 73 kg (161 lb)   BMI 31.44 kg/m  , BMI Body mass index is 31.44 kg/m. GENERAL:  Well appearing.  Obese.  Exam performed as patient sat in her scooter HEENT:  Pupils equal round and reactive, fundi not visualized, oral mucosa unremarkable NECK:  No jugular venous distention, waveform within normal limits, carotid upstroke brisk and symmetric, no bruits LYMPHATICS:  No cervical adenopathy LUNGS:  Clear to auscultation bilaterally HEART:  RRR.  PMI not displaced or sustained,S1 and S2 within normal limits, no S3, no S4, no clicks, no rubs, no murmurs ABD:  Flat, positive bowel sounds normal in frequency in pitch, no bruits, no rebound, no guarding, no midline pulsatile mass, no hepatomegaly, no splenomegaly EXT:  2 plus pulses throughout, no edema, no cyanosis no clubbing SKIN:  No rashes no nodules NEURO:  Cranial nerves II through XII grossly intact, motor grossly intact throughout PSYCH:  Cognitively intact, oriented to person place and time   EKG:  EKG is ordered today. The ekg ordered today demonstrates sinus rhythm.  61 bpm.  L axis deviation.  Low voltage. 07/31/16: Sinus rhythm. Rate 66.  Recent Labs: 07/30/2016: ALT 51; B Natriuretic Peptide 51.7; BUN 12; Creatinine, Ser 0.53; Hemoglobin 13.3; Platelets 419; Potassium 3.6; Sodium 140    Lipid Panel No results found for: CHOL, TRIG, HDL, CHOLHDL, VLDL, LDLCALC, LDLDIRECT  11/23/14: Chol 111, tri 106, hdl 42, ldl 48 Hgb A1c 5.5  03/23/16:  Sodium 138, potassium 4.7, BUN 13, creatinine 0.49 AST 33, ALT 64 Total cholesterol 101, triglycerides 72, HDL 44, LDL 43  Wt Readings from Last 3 Encounters:  08/31/16 73 kg (161 lb)  08/15/16 78 kg (172 lb)  08/02/16 78 kg (172 lb)     TTE 01/31/10: EF 55-60%.  No wall motion abnormalities.  Grade 1 diastolic dysfunction.  Calcification of anterior mitral chordae.  Mildly elevated PASP.  Lexiscan Myoview 08/15/16: The left ventricular ejection fraction is hyperdynamic (>65%).  Nuclear stress EF: 67%.  There was no ST segment deviation noted during stress.  Defect 1: There is a small defect of mild severity present in the basal inferior and mid inferior location.  Findings consistent with ischemia.  This is a low risk study.  Cardiac CT-A 08/29/16:  IMPRESSION: 1) Left dominant coronary arteries with non obstructive disease in proximal and mid LAD  2) Calcium score 56 which is 59th percentile for age and sex  3) Normal aortic root 31 mm   Other studies Reviewed: Additional studies/ records that were reviewed today include: medical record. Review of the above records demonstrates:  Please see elsewhere in the note.     ASSESSMENT AND PLAN:  # Hypertension: BP is much better controlled. Continue carvedilol, amlodipine, and telmisartan.  # Chest pain: # Non-obstructive CAD: [name] continues to have episodes of chest pain. However her cardiac CT-A was very reassuring. She has non--obstructive coronary disease. Her symptoms are likely due to noncardiac chest pain.  Continue aspirin 81 mg daily and simvastatin 20 mg daily at bedtime.  # Hyperlipidemia:  LDL 43 03/2016.  # Takotsubo cardiomyopathy: Resolved.    Current medicines are reviewed at length with the patient today.  The patient does not have concerns regarding medicines.  The following changes have been made:  See above  Labs/ tests ordered today include:    No orders of the defined types were placed in this encounter.    Disposition:   FU with Dr. Jonelle Sidle C. [place] in 6 months.  Signed, Skeet Latch, MD  08/31/2016 1:10 PM    [facility]

## 2016-09-05 DIAGNOSIS — M6281 Muscle weakness (generalized): Secondary | ICD-10-CM | POA: Diagnosis not present

## 2016-10-02 DIAGNOSIS — E119 Type 2 diabetes mellitus without complications: Secondary | ICD-10-CM | POA: Diagnosis not present

## 2016-10-02 DIAGNOSIS — E785 Hyperlipidemia, unspecified: Secondary | ICD-10-CM | POA: Diagnosis not present

## 2016-10-02 DIAGNOSIS — I10 Essential (primary) hypertension: Secondary | ICD-10-CM | POA: Diagnosis not present

## 2016-10-04 DIAGNOSIS — Z130 Encounter for screening for diseases of the blood and blood-forming organs and certain disorders involving the immune mechanism: Secondary | ICD-10-CM | POA: Diagnosis not present

## 2016-10-04 DIAGNOSIS — Z01419 Encounter for gynecological examination (general) (routine) without abnormal findings: Secondary | ICD-10-CM | POA: Diagnosis not present

## 2016-10-04 DIAGNOSIS — Z1389 Encounter for screening for other disorder: Secondary | ICD-10-CM | POA: Diagnosis not present

## 2016-10-04 DIAGNOSIS — N644 Mastodynia: Secondary | ICD-10-CM | POA: Diagnosis not present

## 2016-10-06 ENCOUNTER — Other Ambulatory Visit: Payer: Self-pay | Admitting: Obstetrics and Gynecology

## 2016-10-06 DIAGNOSIS — N644 Mastodynia: Secondary | ICD-10-CM

## 2016-10-10 ENCOUNTER — Other Ambulatory Visit: Payer: Self-pay | Admitting: Obstetrics and Gynecology

## 2016-10-10 DIAGNOSIS — R5381 Other malaise: Secondary | ICD-10-CM

## 2016-10-12 ENCOUNTER — Other Ambulatory Visit: Payer: Self-pay | Admitting: Obstetrics and Gynecology

## 2016-10-12 DIAGNOSIS — E2839 Other primary ovarian failure: Secondary | ICD-10-CM

## 2016-10-25 ENCOUNTER — Ambulatory Visit
Admission: RE | Admit: 2016-10-25 | Discharge: 2016-10-25 | Disposition: A | Discharge status: Home / Self Care | Payer: Medicare Other | Source: Ambulatory Visit | Attending: Obstetrics and Gynecology | Admitting: Obstetrics and Gynecology

## 2016-10-25 DIAGNOSIS — Z780 Asymptomatic menopausal state: Secondary | ICD-10-CM | POA: Diagnosis not present

## 2016-10-25 DIAGNOSIS — M85832 Other specified disorders of bone density and structure, left forearm: Secondary | ICD-10-CM | POA: Diagnosis not present

## 2016-10-25 DIAGNOSIS — N644 Mastodynia: Secondary | ICD-10-CM

## 2016-10-25 DIAGNOSIS — E2839 Other primary ovarian failure: Secondary | ICD-10-CM

## 2016-10-25 DIAGNOSIS — R928 Other abnormal and inconclusive findings on diagnostic imaging of breast: Secondary | ICD-10-CM | POA: Diagnosis not present

## 2016-11-07 DIAGNOSIS — H40033 Anatomical narrow angle, bilateral: Secondary | ICD-10-CM | POA: Diagnosis not present

## 2016-11-07 DIAGNOSIS — H04123 Dry eye syndrome of bilateral lacrimal glands: Secondary | ICD-10-CM | POA: Diagnosis not present

## 2016-11-13 DIAGNOSIS — E785 Hyperlipidemia, unspecified: Secondary | ICD-10-CM | POA: Diagnosis not present

## 2016-11-13 DIAGNOSIS — E119 Type 2 diabetes mellitus without complications: Secondary | ICD-10-CM | POA: Diagnosis not present

## 2016-11-20 DIAGNOSIS — E039 Hypothyroidism, unspecified: Secondary | ICD-10-CM | POA: Diagnosis not present

## 2016-11-20 DIAGNOSIS — E7800 Pure hypercholesterolemia, unspecified: Secondary | ICD-10-CM | POA: Diagnosis not present

## 2016-11-20 DIAGNOSIS — K219 Gastro-esophageal reflux disease without esophagitis: Secondary | ICD-10-CM | POA: Diagnosis not present

## 2016-11-20 DIAGNOSIS — I10 Essential (primary) hypertension: Secondary | ICD-10-CM | POA: Diagnosis not present

## 2017-01-12 ENCOUNTER — Telehealth: Payer: Self-pay | Admitting: Cardiovascular Disease

## 2017-01-12 NOTE — Telephone Encounter (Signed)
Spoke with pt, she has swelling in both feet and ankles. She went to the beach recently and has noticed the swelling since return. She denies SOB or orthopnea. She is unable to weigh due to confinement to a wheelchair. She has taken furosemide everyday this week and has not really seen a change. She watches her sodium and elevates her legs in the chair. She reports drinkling a lot of water to "flush out her kidneys." advised patient to decrease fluids to 1.5 liters daily. She was instructed to take furosemide twice daily for the next 3 days and then if the fluid is gone to go back to previous dosage. If swelling is still there to call back. Also discussed compression hose with the patient.

## 2017-01-12 NOTE — Telephone Encounter (Signed)
°  New Prob  Pt c/o swelling: STAT is pt has developed SOB within 24 hours  1. How long have you been experiencing swelling? 4 days but has worsened  2. Where is the swelling located? Legs bilaterally   3.  Are you currently taking a "fluid pill"? Yes  4.  Are you currently SOB? No  5.  Have you traveled recently? Yes, last week

## 2017-01-17 ENCOUNTER — Telehealth: Payer: Self-pay | Admitting: Cardiovascular Disease

## 2017-01-17 NOTE — Telephone Encounter (Signed)
Returned the call to the patient. She stated that she previously (8/31) had bilateral edema and was instructed to take furosemide twice daily for the next 3 days and then if the fluid is gone to go back to previous dosage. She misunderstood and took 40 mg of furosemide for 4 days with positive results.   She stated that she is currently experiencing bilateral lower extremity edema again. She denies chest pain and shortness of breath. Per Dr. Oval Linsey, the patient needs to come in and be seen. An appointment has been made for 9/6 at 3pm with Almyra Deforest, PA. The patient verbalized her understanding.

## 2017-01-17 NOTE — Telephone Encounter (Signed)
[name] is calling because her feet are swollen , she has kept them up for 4-5 days . But she had to go out on yesterday and they were swollen .  Pt c/o of Chest Pain: STAT if CP now or developed within 24 hours  1. Are you having CP right now?No   2. Are you experiencing any other symptoms (ex. SOB, nausea, vomiting, sweating)? Nausea   3. How long have you been experiencing CP?Saturday Night   4. Is your CP continuous or coming and going? Continuous   5. Have you taken Nitroglycerin? Took 2 Nitros

## 2017-01-18 ENCOUNTER — Ambulatory Visit (INDEPENDENT_AMBULATORY_CARE_PROVIDER_SITE_OTHER): Payer: Medicare Other | Admitting: Physician Assistant

## 2017-01-18 VITALS — BP 94/41 | HR 56 | Ht 60.0 in | Wt 184.0 lb

## 2017-01-18 DIAGNOSIS — R600 Localized edema: Secondary | ICD-10-CM | POA: Diagnosis not present

## 2017-01-18 DIAGNOSIS — I10 Essential (primary) hypertension: Secondary | ICD-10-CM | POA: Diagnosis not present

## 2017-01-18 DIAGNOSIS — E119 Type 2 diabetes mellitus without complications: Secondary | ICD-10-CM | POA: Diagnosis not present

## 2017-01-18 DIAGNOSIS — Z79899 Other long term (current) drug therapy: Secondary | ICD-10-CM | POA: Diagnosis not present

## 2017-01-18 DIAGNOSIS — E039 Hypothyroidism, unspecified: Secondary | ICD-10-CM

## 2017-01-18 MED ORDER — FUROSEMIDE 20 MG PO TABS: 20.0000 mg | ORAL_TABLET | Freq: Every day | ORAL | 1 refills | Status: DC

## 2017-01-18 NOTE — Patient Instructions (Signed)
Medication Instructions:   INCREASE lasix to 20mg  daily  Labwork:   BMET today BMET in 1 week  Testing/Procedures:  none  Follow-Up:  With Dr. Oval Linsey  Use compression stockings at home - please pick up or order some at a 20-30 mmHg strength and wear daily. You may get these at any retail pharmacy.  If you need a refill on your cardiac medications before your next appointment, please call your pharmacy.

## 2017-01-18 NOTE — Progress Notes (Signed)
Cardiology Office Note    Date:  01/20/2017   ID:  [name], DOB 11-18-1945, MRN [id number]  PCP:  Deland Pretty, MD  Cardiologist:  Dr. Oval Linsey   Chief Complaint  Patient presents with  . Follow-up    chest pain over the weekend, had to take 2 nitro pills, weight gain    History of Present Illness:  [name] is a 71 y.o. female with PMH of HTN, takotsubo syndrome, Atrial fibrillation, DM, post-polio syndrome, and hypothyroidism. She was started on Lasix initially 3 times weekly in 2016. She was evaluated for atypical chest pain at the time and had a Myoview on 12/30/2013 that revealed EF 71% with no evidence of ischemia. Her last Myoview was in April 2018 which revealed EF 67% was concern for basal to mid inferior ischemia. She subsequently had a cardiac CTA that revealed 30% LAD stenosis but otherwise no obstructive disease.  She presents today accompanied by her husband. Recently, they went to the beach, she noticed she had significant pedal edema. She try to increase the Lasix to 40 mg daily without significant improvement in her symptom. On physical exam, she actually does not have any significant pretibial edema, she does however have at least 1-2+ pitting edema in the right pedal area. Her lungs clear, she does not have significant JVD. I do not think she is in significant amount of heart failure. I did recommend increase her Lasix to 20 mg daily as a trial. Because she is reliant on wheelchair, she has a lot of dependent edema after sitting on her back all day. Ultimately what she needs is a compression stocking. I will obtain a basic metabolic panel today and also in one week.   Past Medical History:  Diagnosis Date  . Angina pectoris ([place]) 08/02/2016  . Anxiety   . Arthritis    "hands, knees, back" (07/18/2013)  . Asthma   . CHF (congestive heart failure) ([place])   . Chronic back pain   . Chronic lower back pain   . Family history of anesthesia complication    Mother and  Sister- N/V  . GERD (gastroesophageal reflux disease)   . H/O hiatal hernia   . GGEZMOQH(476.5)    "weekly" (07/18/2013)  . Hyperlipemia   . Hypertension   . Hypothyroidism   . Insomnia   . Kidney stones   . Mental disorder   . Migraine    "years ago" (07/18/2013)  . Osteopenia   . Post-polio syndrome   . Post-polio syndrome   . Sleep apnea   . Takotsubo cardiomyopathy   . Type II diabetes mellitus ([place])     Past Surgical History:  Procedure Laterality Date  . ABDOMINAL HYSTERECTOMY    . APPENDECTOMY    . CARPAL TUNNEL RELEASE Bilateral   . CHOLECYSTECTOMY    . COLONOSCOPY    . CYSTOSCOPY    . KNEE ARTHROSCOPY Left   . KNEE SURGERY Left   . LEG SURGERY Left    polio  . ORIF PATELLA Left 07/18/2013   Procedure: OPEN REDUCTION INTERNAL (ORIF) LEFT FIXATION PATELLA;  Surgeon: Augustin Schooling, MD;  Location: [facility];  Service: Orthopedics;  Laterality: Left;  . ORIF PATELLA FRACTURE Left 07/18/2013  . TUBAL LIGATION    . WISDOM TOOTH EXTRACTION    . WRIST TENDON TRANSFER Right     Current Medications: Outpatient Medications Prior to Visit  Medication Sig Dispense Refill  . amLODipine (NORVASC) 5 MG tablet Take 2.5 tablets by  mouth daily. Take 1 tab daily   0  . aspirin 81 MG tablet Take 81 mg by mouth at bedtime.     . benzonatate (TESSALON) 100 MG capsule Take 100 mg by mouth 3 (three) times daily as needed for cough.    . Calcium Carbonate-Vitamin D (CALTRATE 600+D) 600-400 MG-UNIT per chew tablet Chew 1 tablet by mouth 2 (two) times daily.     . clonazePAM (KLONOPIN) 0.5 MG tablet Take 0.5 mg by mouth 2 (two) times daily.     . fish oil-omega-3 fatty acids 1000 MG capsule Take 2 g by mouth 2 (two) times daily.      . hydrOXYzine (ATARAX/VISTARIL) 25 MG tablet Take 25 mg by mouth 3 (three) times daily as needed for itching.    . levothyroxine (SYNTHROID, LEVOTHROID) 100 MCG tablet Take 100 mcg by mouth daily before breakfast.    . metFORMIN (GLUCOPHAGE) 500 MG tablet Take  1,000 mg by mouth 2 (two) times daily with a meal.     . mirabegron ER (MYRBETRIQ) 50 MG TB24 tablet Take 50 mg by mouth daily.    . Multiple Vitamins-Minerals (MULTIVITAMIN WITH MINERALS) tablet Take 1 tablet by mouth daily.      . nitroGLYCERIN (NITROSTAT) 0.4 MG SL tablet Place 0.4 mg under the tongue every 5 (five) minutes as needed for chest pain.    Marland Kitchen nystatin cream (MYCOSTATIN) Apply 1 application topically daily as needed for dry skin (irritation. boils on buttocks). Use as needed  0  . pantoprazole (PROTONIX) 40 MG tablet Take 1 tablet by mouth daily. Take 1 tab daily  98  . telmisartan (MICARDIS) 80 MG tablet Take 1 tablet by mouth daily. Take 1 tab daily  0  . thiamine (VITAMIN B-1) 100 MG tablet Take 100 mg by mouth daily.    Marland Kitchen tiZANidine (ZANAFLEX) 4 MG tablet Take 4 mg by mouth 5 (five) times daily as needed for muscle spasms. PAIN    . zolpidem (AMBIEN) 10 MG tablet Take 10 mg by mouth at bedtime.     . furosemide (LASIX) 20 MG tablet Take 0.5 tablets (10 mg total) by mouth every Monday, Wednesday, and Friday. 36 tablet 3  . carvedilol (COREG) 12.5 MG tablet Take 1 tablet (12.5 mg total) by mouth 2 (two) times daily. 180 tablet 3  . simvastatin (ZOCOR) 40 MG tablet Take 20 mg by mouth at bedtime.     . traMADol (ULTRAM) 50 MG tablet Take 50 mg by mouth every 6 (six) hours as needed for moderate pain.     No facility-administered medications prior to visit.      Allergies:   Ciprofloxacin; Penicillins; Sulfa antibiotics; Acetaminophen; Aspirin; Macrodantin [nitrofurantoin macrocrystal]; Nsaids; Percocet [oxycodone-acetaminophen]; Propoxyphene n-acetaminophen; Soma compound [carisoprodol-aspirin]; and Tolectin [tolmetin]   Social History   Social History  . Marital status: Married    Spouse name: N/A  . Number of children: N/A  . Years of education: N/A   Occupational History  . Retired     Social History Main Topics  . Smoking status: Never Smoker  . Smokeless tobacco:  Never Used  . Alcohol use No  . Drug use: No  . Sexual activity: No   Other Topics Concern  . None   Social History Narrative  . None     Family History:  The patient's family history includes Allergies in her grandchild; Cancer in her father; Diabetes in her father; Heart failure in her father and mother; Parkinson's disease in her  mother.   ROS:   Please see the history of present illness.    ROS All other systems reviewed and are negative.   PHYSICAL EXAM:   VS:  BP (!) 94/41   Pulse (!) 56   Ht 5' (1.524 m)   Wt 184 lb (83.5 kg)   BMI 35.94 kg/m    GEN: Well nourished, well developed, in no acute distress  HEENT: normal  Neck: no JVD, carotid bruits, or masses Cardiac: RRR; no murmurs, rubs, or gallops. 2+ R pedal edema  Respiratory:  clear to auscultation bilaterally, normal work of breathing GI: soft, nontender, nondistended, + BS MS: no deformity or atrophy  Skin: warm and dry, no rash Neuro:  Alert and Oriented x 3, Strength and sensation are intact Psych: euthymic mood, full affect  Wt Readings from Last 3 Encounters:  01/18/17 184 lb (83.5 kg)  08/31/16 161 lb (73 kg)  08/15/16 172 lb (78 kg)      Studies/Labs Reviewed:   EKG:  EKG is ordered today.  The ekg ordered today demonstrates Normal sinus rhythm, nonspecific ST-T wave changes.  Recent Labs: 07/30/2016: ALT 51; B Natriuretic Peptide 51.7; Hemoglobin 13.3; Platelets 419 01/18/2017: BUN 23; Creatinine, Ser 1.07; Potassium 4.3; Sodium 133   Lipid Panel No results found for: CHOL, TRIG, HDL, CHOLHDL, VLDL, LDLCALC, LDLDIRECT  Additional studies/ records that were reviewed today include:   Lexiscan Myoview 08/15/16: The left ventricular ejection fraction is hyperdynamic (>65%).  Nuclear stress EF: 67%.  There was no ST segment deviation noted during stress.  Defect 1: There is a small defect of mild severity present in the basal inferior and mid inferior location.  Findings consistent with  ischemia.  This is a low risk study.  Cardiac CT-A 08/29/16:  IMPRESSION: 1) Left dominant coronary arteries with non obstructive disease in proximal and mid LAD  2) Calcium score 56 which is 59th percentile for age and sex  3) Normal aortic root 31 mm   ASSESSMENT:    1. Pedal edema   2. Encounter for long-term (current) use of medications   3. Essential hypertension   4. Controlled type 2 diabetes mellitus without complication, without long-term current use of insulin ([place])   5. Hypothyroidism, unspecified type      PLAN:  In order of problems listed above:  1. Pedal edema: Mainly in the right lower extremity, likely related to dependent position. He recommended compression stocking. As a trial, I will also start her on 20 minutes and 80 of Lasix. Basic metabolic panel today and also in one week.  2. Hypertension: Blood pressure borderline, will discontinue her amlodipine  3. DM 2: On metformin  4. Hypothyroidism: On Synthroid    Medication Adjustments/Labs and Tests Ordered: Current medicines are reviewed at length with the patient today.  Concerns regarding medicines are outlined above.  Medication changes, Labs and Tests ordered today are listed in the Patient Instructions below. Patient Instructions  Medication Instructions:   INCREASE lasix to 20mg  daily  Labwork:   BMET today BMET in 1 week  Testing/Procedures:  none  Follow-Up:  With Dr. Oval Linsey  Use compression stockings at home - please pick up or order some at a 20-30 mmHg strength and wear daily. You may get these at any retail pharmacy.  If you need a refill on your cardiac medications before your next appointment, please call your pharmacy.      Hilbert Corrigan, [place]  01/20/2017 2:05 PM    [facility] [address], [place], [place]  [address] Phone: [phone]; Fax: [phone]

## 2017-01-19 LAB — BASIC METABOLIC PANEL
BUN / CREAT RATIO: 21 (ref 12–28)
BUN: 23 mg/dL (ref 8–27)
CHLORIDE: 91 mmol/L — AB (ref 96–106)
CO2: 27 mmol/L (ref 20–29)
Calcium: 10.5 mg/dL — ABNORMAL HIGH (ref 8.7–10.3)
Creatinine, Ser: 1.07 mg/dL — ABNORMAL HIGH (ref 0.57–1.00)
GFR calc non Af Amer: 52 mL/min/{1.73_m2} — ABNORMAL LOW (ref >59)
GFR, EST AFRICAN AMERICAN: 60 mL/min/{1.73_m2} (ref >59)
Glucose: 122 mg/dL — ABNORMAL HIGH (ref 65–99)
POTASSIUM: 4.3 mmol/L (ref 3.5–5.2)
SODIUM: 133 mmol/L — AB (ref 134–144)

## 2017-01-19 NOTE — Progress Notes (Signed)
Kidney function worsened slightly compare to 5 month ago, potassium level stable, if renal function continue to worsen on the next lab work, then leg swelling may be more related to venous insufficiency. If that's the case, leg elevation and compression stocking would be more helpful. We already suggested compression stocking during yesterday's visit.  Instead having repeat BMET in 1 week, would be better if repeat in 5 days (that would be Tue 9/11)

## 2017-01-20 ENCOUNTER — Encounter: Payer: Self-pay | Admitting: Physician Assistant

## 2017-01-23 ENCOUNTER — Telehealth: Payer: Self-pay | Admitting: *Deleted

## 2017-01-23 DIAGNOSIS — Z79899 Other long term (current) drug therapy: Secondary | ICD-10-CM

## 2017-01-23 NOTE — Telephone Encounter (Signed)
Pt advised on repeat labwork. She does not have transportation on Tuesdays, but states she will come tomorrow for labs.  Aware we will f/u w further recommendations.  Pt reports changes to medication dosing:  States she was taking too much telmisartan -- she cut her telmisartan to 80mg  daily (our recommended dosage) because she realized when she got home on 9/6 that she had actually been taking twice that dosage daily since last seen in office.   She also recently cut atarax out at recommendation of PCP.  Have discussed w Isaac Laud who is aware of case.

## 2017-01-23 NOTE — Telephone Encounter (Signed)
-----   Message from [place], [place] sent at 01/19/2017  1:49 PM EDT ----- Kidney function worsened slightly compare to 5 month ago, potassium level stable, if renal function continue to worsen on the next lab work, then leg swelling may be more related to venous insufficiency. If that's the case, leg elevation and compress ion stocking would be more helpful. We already suggested compression stocking during yesterday's visit.  Instead having repeat BMET in 1 week, would be better if repeat in 5 days (that would be Tue 9/11)

## 2017-01-24 ENCOUNTER — Other Ambulatory Visit: Payer: Self-pay | Admitting: *Deleted

## 2017-01-24 DIAGNOSIS — Z79899 Other long term (current) drug therapy: Secondary | ICD-10-CM | POA: Diagnosis not present

## 2017-01-25 ENCOUNTER — Emergency Department (HOSPITAL_BASED_OUTPATIENT_CLINIC_OR_DEPARTMENT_OTHER)
Admission: EM | Admit: 2017-01-25 | Discharge: 2017-01-25 | Disposition: A | Discharge status: Home / Self Care | Payer: Medicare Other | Attending: Emergency Medicine | Admitting: Emergency Medicine

## 2017-01-25 ENCOUNTER — Emergency Department (HOSPITAL_COMMUNITY)
Admission: EM | Admit: 2017-01-25 | Discharge: 2017-01-25 | Disposition: A | Discharge status: Home / Self Care | Payer: Medicare Other | Source: Home / Self Care

## 2017-01-25 ENCOUNTER — Emergency Department (HOSPITAL_BASED_OUTPATIENT_CLINIC_OR_DEPARTMENT_OTHER): Payer: Medicare Other

## 2017-01-25 ENCOUNTER — Encounter (HOSPITAL_BASED_OUTPATIENT_CLINIC_OR_DEPARTMENT_OTHER): Payer: Self-pay | Admitting: *Deleted

## 2017-01-25 ENCOUNTER — Encounter (HOSPITAL_COMMUNITY): Payer: Self-pay | Admitting: Emergency Medicine

## 2017-01-25 DIAGNOSIS — Z7984 Long term (current) use of oral hypoglycemic drugs: Secondary | ICD-10-CM | POA: Insufficient documentation

## 2017-01-25 DIAGNOSIS — J45909 Unspecified asthma, uncomplicated: Secondary | ICD-10-CM | POA: Diagnosis not present

## 2017-01-25 DIAGNOSIS — S91201A Unspecified open wound of right great toe with damage to nail, initial encounter: Secondary | ICD-10-CM | POA: Diagnosis not present

## 2017-01-25 DIAGNOSIS — Y939 Activity, unspecified: Secondary | ICD-10-CM | POA: Diagnosis not present

## 2017-01-25 DIAGNOSIS — Y929 Unspecified place or not applicable: Secondary | ICD-10-CM | POA: Diagnosis not present

## 2017-01-25 DIAGNOSIS — E119 Type 2 diabetes mellitus without complications: Secondary | ICD-10-CM | POA: Insufficient documentation

## 2017-01-25 DIAGNOSIS — M79674 Pain in right toe(s): Secondary | ICD-10-CM | POA: Diagnosis not present

## 2017-01-25 DIAGNOSIS — I509 Heart failure, unspecified: Secondary | ICD-10-CM | POA: Insufficient documentation

## 2017-01-25 DIAGNOSIS — I110 Hypertensive heart disease with heart failure: Secondary | ICD-10-CM | POA: Insufficient documentation

## 2017-01-25 DIAGNOSIS — Z7982 Long term (current) use of aspirin: Secondary | ICD-10-CM | POA: Diagnosis not present

## 2017-01-25 DIAGNOSIS — X58XXXA Exposure to other specified factors, initial encounter: Secondary | ICD-10-CM | POA: Diagnosis not present

## 2017-01-25 DIAGNOSIS — S91209A Unspecified open wound of unspecified toe(s) with damage to nail, initial encounter: Secondary | ICD-10-CM

## 2017-01-25 DIAGNOSIS — Y999 Unspecified external cause status: Secondary | ICD-10-CM | POA: Diagnosis not present

## 2017-01-25 DIAGNOSIS — E039 Hypothyroidism, unspecified: Secondary | ICD-10-CM | POA: Insufficient documentation

## 2017-01-25 DIAGNOSIS — M79671 Pain in right foot: Secondary | ICD-10-CM | POA: Insufficient documentation

## 2017-01-25 DIAGNOSIS — Z5321 Procedure and treatment not carried out due to patient leaving prior to being seen by health care provider: Secondary | ICD-10-CM | POA: Insufficient documentation

## 2017-01-25 DIAGNOSIS — S99921A Unspecified injury of right foot, initial encounter: Secondary | ICD-10-CM | POA: Diagnosis present

## 2017-01-25 LAB — BASIC METABOLIC PANEL
BUN/Creatinine Ratio: 20 (ref 12–28)
BUN: 11 mg/dL (ref 8–27)
CO2: 22 mmol/L (ref 20–29)
Calcium: 9.6 mg/dL (ref 8.7–10.3)
Chloride: 99 mmol/L (ref 96–106)
Creatinine, Ser: 0.54 mg/dL — ABNORMAL LOW (ref 0.57–1.00)
GFR, EST AFRICAN AMERICAN: 108 mL/min/{1.73_m2} (ref >59)
GFR, EST NON AFRICAN AMERICAN: 94 mL/min/{1.73_m2} (ref >59)
Glucose: 96 mg/dL (ref 65–99)
POTASSIUM: 4.7 mmol/L (ref 3.5–5.2)
Sodium: 136 mmol/L (ref 134–144)

## 2017-01-25 MED ORDER — CEPHALEXIN 500 MG PO CAPS: 500.0000 mg | ORAL_CAPSULE | Freq: Two times a day (BID) | ORAL | 0 refills | Status: DC

## 2017-01-25 NOTE — ED Notes (Signed)
ED Provider at bedside. EDPA Tatayana at bedside to assess

## 2017-01-25 NOTE — ED Triage Notes (Signed)
Pt reports she got her R toe caught in a vent around 1030 this am and that it pulled her big toenail off. Toe is wrapped with gauze -- bleeding controlled.

## 2017-01-25 NOTE — ED Provider Notes (Signed)
[facility] Provider Note   CSN: [id number] Arrival date & time: 01/25/17  1453     History   Chief Complaint Chief Complaint  Patient presents with  . Toe Injury    HPI [name] is a 71 y.o. female.  HPI [name] is a 71 y.o. female with multiple medical problems including HTN, DM, CHF, polio, wheel chair bound, presents to ED with complaint of toe injury. Pt states her leg slipped off the wheel chair and  Caught on a vent this morning. States it avulsed her toenail. Reports bleeding which was controlled with pressure. She states she has very little sensation in her toes so she denies any pain at this time. She states her tetanus is up-to-date. Denies any other injuries.  Past Medical History:  Diagnosis Date  . Angina pectoris ([place]) 08/02/2016  . Anxiety   . Arthritis    "hands, knees, back" (07/18/2013)  . Asthma   . CHF (congestive heart failure) ([place])   . Chronic back pain   . Chronic lower back pain   . Family history of anesthesia complication    Mother and Sister- N/V  . GERD (gastroesophageal reflux disease)   . H/O hiatal hernia   . UVOZDGUY(403.4)    "weekly" (07/18/2013)  . Hyperlipemia   . Hypertension   . Hypothyroidism   . Insomnia   . Kidney stones   . Mental disorder   . Migraine    "years ago" (07/18/2013)  . Osteopenia   . Post-polio syndrome   . Post-polio syndrome   . Sleep apnea   . Takotsubo cardiomyopathy   . Type II diabetes mellitus [facility])     Patient Active Problem List   Diagnosis Date Noted  . Angina pectoris ([place]) 08/02/2016  . Post-polio syndrome 07/20/2013  .          07/20/2013  . Left patella fracture 07/18/2013  . HYPOTHYROIDISM 03/12/2009  . DM 03/12/2009  . HYPERTENSION 03/12/2009  . ATRIAL FIBRILLATION 03/12/2009  . TAKOTSUBO SYNDROME 03/12/2009  . EDEMA 03/12/2009    Past Surgical History:  Procedure Laterality Date  . ABDOMINAL HYSTERECTOMY    . APPENDECTOMY    . CARPAL TUNNEL RELEASE Bilateral     . CHOLECYSTECTOMY    . COLONOSCOPY    . CYSTOSCOPY    . KNEE ARTHROSCOPY Left   . KNEE SURGERY Left   . LEG SURGERY Left    polio  . ORIF PATELLA Left 07/18/2013   Procedure: OPEN REDUCTION INTERNAL (ORIF) LEFT FIXATION PATELLA;  Surgeon: Augustin Schooling, MD;  Location: [facility];  Service: Orthopedics;  Laterality: Left;  . ORIF PATELLA FRACTURE Left 07/18/2013  . TUBAL LIGATION    . WISDOM TOOTH EXTRACTION    . WRIST TENDON TRANSFER Right     OB History    No data available       Home Medications    Prior to Admission medications   Medication Sig Start Date End Date Taking? Authorizing Provider  aspirin 81 MG tablet Take 81 mg by mouth at bedtime.    Yes [provider]  benzonatate (TESSALON) 100 MG capsule Take 100 mg by mouth 3 (three) times daily as needed for cough.   Yes [provider]  Calcium Carbonate-Vitamin D (CALTRATE 600+D) 600-400 MG-UNIT per chew tablet Chew 1 tablet by mouth 2 (two) times daily.    Yes [provider]  clonazePAM (KLONOPIN) 0.5 MG tablet Take 0.5 mg by mouth 2 (two) times  daily.    Yes [provider]  fish oil-omega-3 fatty acids 1000 MG capsule Take 2 g by mouth 2 (two) times daily.     Yes [provider]  levothyroxine (SYNTHROID, LEVOTHROID) 100 MCG tablet Take 100 mcg by mouth daily before breakfast.   Yes [provider]  metFORMIN (GLUCOPHAGE) 500 MG tablet Take 1,000 mg by mouth 2 (two) times daily with a meal.    Yes [provider]  mirabegron ER (MYRBETRIQ) 50 MG TB24 tablet Take 50 mg by mouth daily.   Yes [provider]  Multiple Vitamins-Minerals (MULTIVITAMIN WITH MINERALS) tablet Take 1 tablet by mouth daily.     Yes [provider]  nitroGLYCERIN (NITROSTAT) 0.4 MG SL tablet Place 0.4 mg under the tongue every 5 (five) minutes as needed for chest pain.   Yes [provider]  nystatin cream (MYCOSTATIN) Apply 1 application topically daily as  needed for dry skin (irritation. boils on buttocks). Use as needed 09/29/14  Yes [provider]  pantoprazole (PROTONIX) 40 MG tablet Take 1 tablet by mouth daily. Take 1 tab daily 12/21/14  Yes [provider]  simvastatin (ZOCOR) 20 MG tablet Take 20 mg by mouth daily.   Yes [provider]  telmisartan (MICARDIS) 80 MG tablet Take 1 tablet by mouth daily. Take 1 tab daily 12/14/14  Yes [provider]  tiZANidine (ZANAFLEX) 4 MG tablet Take 4 mg by mouth 5 (five) times daily as needed for muscle spasms. PAIN   Yes [provider]  vitamin E (VITAMIN E) 400 UNIT capsule Take 400 Units by mouth daily.   Yes [provider]  zolpidem (AMBIEN) 10 MG tablet Take 5 mg by mouth at bedtime.    Yes [provider]  amLODipine (NORVASC) 5 MG tablet Take 2.5 tablets by mouth daily. Take 1 tab daily  12/16/14   [provider]  carvedilol (COREG) 12.5 MG tablet Take 1 tablet (12.5 mg total) by mouth 2 (two) times daily. 08/02/16 10/31/16  Skeet Latch, MD  furosemide (LASIX) 20 MG tablet Take 1 tablet (20 mg total) by mouth daily. 01/18/17   Almyra Deforest, PA  thiamine (VITAMIN B-1) 100 MG tablet Take 100 mg by mouth daily.    [provider]    Family History Family History  Problem Relation Age of Onset  . Heart failure Mother   . Parkinson's disease Mother   . Diabetes Father   . Cancer Father   . Heart failure Father   . Allergies Grandchild     Social History Social History  Substance Use Topics  . Smoking status: Never Smoker  . Smokeless tobacco: Never Used  . Alcohol use No     Allergies   Ciprofloxacin; Penicillins; Sulfa antibiotics; Acetaminophen; Aspirin; Macrodantin [nitrofurantoin macrocrystal]; Nsaids; Percocet [oxycodone-acetaminophen]; Propoxyphene n-acetaminophen; Soma compound [carisoprodol-aspirin]; and Tolectin [tolmetin]   Review of Systems Review of Systems  Constitutional: Negative for chills  and fever.  Respiratory: Negative for cough, chest tightness and shortness of breath.   Cardiovascular: Negative for chest pain, palpitations and leg swelling.  Musculoskeletal: Positive for arthralgias. Negative for myalgias, neck pain and neck stiffness.  Skin: Negative for rash.  Neurological: Negative for dizziness, weakness and headaches.  All other systems reviewed and are negative.    Physical Exam Updated Vital Signs BP (!) 111/51 (BP Location: Left Arm)   Pulse (!) 55   Temp 98.2 F (36.8 C) (Oral)   Resp 18   Ht  5' (1.524 m)   Wt 83.5 kg (184 lb)   SpO2 100%   BMI 35.94 kg/m   Physical Exam  Constitutional: She is oriented to person, place, and time. She appears well-developed and well-nourished. No distress.  Eyes: Conjunctivae are normal.  Neck: Neck supple.  Musculoskeletal:  No obvious swelling or bruising to the right toe. Toenail partially avulsed. Hemostatic. Pt has decreased senation to her feet and unable to move.   Neurological: She is alert and oriented to person, place, and time.  Skin: Skin is warm and dry.  Nursing note and vitals reviewed.    ED Treatments / Results  Labs (all labs ordered are listed, but only abnormal results are displayed) Labs Reviewed - No data to display  EKG  EKG Interpretation None       Radiology No results found.  Procedures Procedures (including critical care time)  Medications Ordered in ED Medications - No data to display   Initial Impression / Assessment and Plan / ED Course  I have reviewed the triage vital signs and the nursing notes.  Pertinent labs & imaging results that were available during my care of the patient were reviewed by me and considered in my medical decision making (see chart for details).     Pt reports injury to the right great toe, and toenail partial avulsion. Will get xrays. Tdap up to date. Toenail was placed back into the nail fold. Foot washed and soaked in soapy water. I  trimmed toenail. I used dermabond to secure toenail.  Will dc home with keflex. Pt requested refill on  "nerve pills and pain pills" however advised we do not refill these in ED. Follow up with pre scriber. Discussed watching for signs of infection. Follow up with pcp.   Vitals:   01/25/17 1505 01/25/17 1507 01/25/17 1736 01/25/17 1931  BP: (!) 111/51  (!) 164/64 (!) 191/76  Pulse: (!) 55  64 64  Resp: 18  18 16   Temp: 98.2 F (36.8 C)     TempSrc: Oral     SpO2: 100%  100% 100%  Weight:  83.5 kg (184 lb)    Height:  5' (1.524 m)       Final Clinical Impressions(s) / ED Diagnoses   Final diagnoses:  Avulsion of toenail, initial encounter    New Prescriptions Discharge Medication List as of 01/25/2017  7:24 PM    START taking these medications   Details  cephALEXin (KEFLEX) 500 MG capsule Take 1 capsule (500 mg total) by mouth 2 (two) times daily., Starting Thu 01/25/2017, Print         [name], [place], PA-C 01/26/17 2130    Gareth Morgan, MD 01/28/17 1416

## 2017-01-25 NOTE — Discharge Instructions (Signed)
Keep toe clean and wrapped. Watch for any signs of infection. Follow up with your family doctor for recheck next week.

## 2017-01-25 NOTE — ED Notes (Signed)
dermabond at bedside, Lahoma Rocker, [place] notified

## 2017-01-25 NOTE — ED Notes (Signed)
Patient transported to X-ray 

## 2017-01-25 NOTE — ED Triage Notes (Signed)
Patient states that she was in wheelchair at home and right foot fell off and got caught ripping off toenail causing it to bleed. Patient diabetic and concerned cause infection.

## 2017-01-25 NOTE — ED Notes (Signed)
ED Provider at bedside. 

## 2017-01-29 DIAGNOSIS — S91209S Unspecified open wound of unspecified toe(s) with damage to nail, sequela: Secondary | ICD-10-CM | POA: Diagnosis not present

## 2017-01-30 ENCOUNTER — Telehealth: Payer: Self-pay | Admitting: Cardiovascular Disease

## 2017-01-30 NOTE — Telephone Encounter (Signed)
New message   Pt states she was not called about results for her labwork from last week. She requests a call back

## 2017-01-30 NOTE — Telephone Encounter (Signed)
Returned the call to the patient for her lab results:  Notes recorded by Almyra Deforest, [place] on 01/25/2017 at 11:34 AM EDT Kidney function returned to normal. Continue current therapy  Patient is confused about what dosage of furosemide and carvedilol she should be taking. She was currently at another physician's office and asked that she be called back after 2.

## 2017-01-30 NOTE — Telephone Encounter (Signed)
Patient has been made aware that she should be taking furosemide 20 mg tablet daily and that she has a follow up with Dr. Oval Linsey on 10/15 at 3:20.

## 2017-02-20 DIAGNOSIS — E039 Hypothyroidism, unspecified: Secondary | ICD-10-CM | POA: Diagnosis not present

## 2017-02-20 DIAGNOSIS — Z23 Encounter for immunization: Secondary | ICD-10-CM | POA: Diagnosis not present

## 2017-02-20 DIAGNOSIS — I10 Essential (primary) hypertension: Secondary | ICD-10-CM | POA: Diagnosis not present

## 2017-02-20 DIAGNOSIS — E785 Hyperlipidemia, unspecified: Secondary | ICD-10-CM | POA: Diagnosis not present

## 2017-02-20 DIAGNOSIS — E119 Type 2 diabetes mellitus without complications: Secondary | ICD-10-CM | POA: Diagnosis not present

## 2017-02-26 ENCOUNTER — Ambulatory Visit (INDEPENDENT_AMBULATORY_CARE_PROVIDER_SITE_OTHER): Payer: Medicare Other | Admitting: Cardiovascular Disease

## 2017-02-26 ENCOUNTER — Encounter: Payer: Self-pay | Admitting: Cardiovascular Disease

## 2017-02-26 VITALS — BP 130/58 | HR 54 | Ht 60.0 in | Wt 175.0 lb

## 2017-02-26 DIAGNOSIS — R079 Chest pain, unspecified: Secondary | ICD-10-CM | POA: Diagnosis not present

## 2017-02-26 DIAGNOSIS — E7800 Pure hypercholesterolemia, unspecified: Secondary | ICD-10-CM

## 2017-02-26 DIAGNOSIS — I10 Essential (primary) hypertension: Secondary | ICD-10-CM

## 2017-02-26 NOTE — Patient Instructions (Addendum)

## 2017-02-26 NOTE — Progress Notes (Signed)
Cardiology Office Note   Date:  02/28/2017   ID:  [name], DOB October 19, 1945, MRN [id number]  PCP:  Deland Pretty, MD  Cardiologist:   Skeet Latch, MD   Chief Complaint  Patient presents with  . Follow-up    6 months     History of Present Illness: [name] is a 71 y.o. female with HTN, Takotsubo syndrome, DM, post-Polio syndrome, and hypothyroidism who presents for follow up. [name] was first seen 12/2014 for hypertension and atypical chest pain.  At that appointment she had lower extremity edema.  Furosemide 3 times weekly was added to her regimen.  She also reported atypical chest pain and was referred for [facility] 12/31/14 that revealed LVEF 71% with no evidence of ischemia was seen in the ED 07/30/16.  [name] was seen in the ED 07/2016 with chest tightness and poorly controlled blood pressure. Cardiac enzymes were negative. Her EKG showed nonspecific ST-T changes. She was instructed to follow-up with cardiology as an outpatient.  She had a Lexiscan Myoview 08/15/16 that revealed LVEF 67% with concern for basal to mid inferior ischemia.  She subsequently had a cardiac CT-A that revealed 30% LAD stenosis and no obstructive disease.    Metoprolol was switched to carvedilol due to high blood pressures and low heart rates.  Since her last appointment [name] has been very stressed.  Her bother died in 11-04-22 and her granddaughter's husband has a pulmonary nodule that may be malignant.   She has been very emotional and stressed lately.  She sometimes starts crying for no reason.  She takes clonazepam twice daily but it isn't helping anymore.   Her blood pressure has been well-controlled.  She saw [name], [place], on 01/2017 and amlodipine was discontinued because it was thought to be contributing to her edema.  She was instructed to wear compression stockings but has been unable.  They caused severe swelling in her knee.  Her lower extremity edema has been better since stopping  amlodipine.   She denies any chest pain and her breathing has been stable.  She does arm exercise daily but is unable to walk 2/2 leg weakness from polio.    Past Medical History:  Diagnosis Date  . Angina pectoris ([place]) 08/02/2016  . Anxiety   . Arthritis    "hands, knees, back" (07/18/2013)  . Asthma   . CHF (congestive heart failure) ([place])   . Chronic back pain   . Chronic lower back pain   . Family history of anesthesia complication    Mother and Sister- N/V  . GERD (gastroesophageal reflux disease)   . H/O hiatal hernia   . JMEQASTM(196.2)    "weekly" (07/18/2013)  . Hyperlipemia   . Hypertension   . Hypothyroidism   . Insomnia   . Kidney stones   . Mental disorder   . Migraine    "years ago" (07/18/2013)  . Osteopenia   . Post-polio syndrome   . Post-polio syndrome   . Sleep apnea   . Takotsubo cardiomyopathy   . Type II diabetes mellitus ([place])     Past Surgical History:  Procedure Laterality Date  . ABDOMINAL HYSTERECTOMY    . APPENDECTOMY    . CARPAL TUNNEL RELEASE Bilateral   . CHOLECYSTECTOMY    . COLONOSCOPY    . CYSTOSCOPY    . KNEE ARTHROSCOPY Left   . KNEE SURGERY Left   . LEG SURGERY Left    polio  . ORIF PATELLA  Left 07/18/2013   Procedure: OPEN REDUCTION INTERNAL (ORIF) LEFT FIXATION PATELLA;  Surgeon: Augustin Schooling, MD;  Location: [facility];  Service: Orthopedics;  Laterality: Left;  . ORIF PATELLA FRACTURE Left 07/18/2013  . TUBAL LIGATION    . WISDOM TOOTH EXTRACTION    . WRIST TENDON TRANSFER Right      Current Outpatient Prescriptions  Medication Sig Dispense Refill  . aspirin 81 MG tablet Take 81 mg by mouth at bedtime.     . benzonatate (TESSALON) 100 MG capsule Take 100 mg by mouth 3 (three) times daily as needed for cough.    . Calcium Carbonate-Vitamin D (CALTRATE 600+D) 600-400 MG-UNIT per chew tablet Chew 1 tablet by mouth 2 (two) times daily.     . clonazePAM (KLONOPIN) 0.5 MG tablet Take 0.5 mg by mouth 2 (two) times daily.     . fish  oil-omega-3 fatty acids 1000 MG capsule Take 2 g by mouth 2 (two) times daily.      . furosemide (LASIX) 20 MG tablet Take 1 tablet (20 mg total) by mouth daily. 90 tablet 1  . levothyroxine (SYNTHROID, LEVOTHROID) 100 MCG tablet Take 100 mcg by mouth daily before breakfast.    . metFORMIN (GLUCOPHAGE) 500 MG tablet Take 1,000 mg by mouth 2 (two) times daily with a meal.     . mirabegron ER (MYRBETRIQ) 50 MG TB24 tablet Take 50 mg by mouth daily.    . Multiple Vitamins-Minerals (MULTIVITAMIN WITH MINERALS) tablet Take 1 tablet by mouth daily.      . nitroGLYCERIN (NITROSTAT) 0.4 MG SL tablet Place 0.4 mg under the tongue every 5 (five) minutes as needed for chest pain.    Marland Kitchen nystatin cream (MYCOSTATIN) Apply 1 application topically daily as needed for dry skin (irritation. boils on buttocks). Use as needed  0  . pantoprazole (PROTONIX) 40 MG tablet Take 1 tablet by mouth daily. Take 1 tab daily  98  . simvastatin (ZOCOR) 20 MG tablet Take 20 mg by mouth daily.    Marland Kitchen telmisartan (MICARDIS) 80 MG tablet Take 1 tablet by mouth daily. Take 1 tab daily  0  . thiamine (VITAMIN B-1) 100 MG tablet Take 100 mg by mouth daily.    Marland Kitchen tiZANidine (ZANAFLEX) 4 MG tablet Take 4 mg by mouth 5 (five) times daily as needed for muscle spasms. PAIN    . vitamin E (VITAMIN E) 400 UNIT capsule Take 400 Units by mouth daily.    Marland Kitchen zolpidem (AMBIEN) 5 MG tablet Take 5 mg by mouth at bedtime as needed for sleep.    . carvedilol (COREG) 12.5 MG tablet Take 1 tablet (12.5 mg total) by mouth 2 (two) times daily. 180 tablet 3   No current facility-administered medications for this visit.     Allergies:   Ciprofloxacin; Penicillins; Sulfa antibiotics; Acetaminophen; Aspirin; Macrodantin [nitrofurantoin macrocrystal]; Nsaids; Percocet [oxycodone-acetaminophen]; Propoxyphene n-acetaminophen; Soma compound [carisoprodol-aspirin]; and Tolectin [tolmetin]    Social History:  The patient  reports that she has never smoked. She has  never used smokeless tobacco. She reports that she does not drink alcohol or use drugs.   Family History:  The patient's family history includes Allergies in her grandchild; Cancer in her father; Diabetes in her father; Heart failure in her father and mother; Parkinson's disease in her mother.    ROS:  Please see the history of present illness.   Otherwise, review of systems are positive for back pain, leg pain, headaches.   All other systems  are reviewed and negative.    PHYSICAL EXAM: VS:  BP (!) 130/58   Pulse (!) 54   Ht 5' (1.524 m)   Wt 79.4 kg (175 lb)   BMI 34.18 kg/m  , BMI Body mass index is 34.18 kg/m. GENERAL:  Well appearing.  Obese.  Exam performed as patient sat in her scooter HEENT:  Pupils equal round and reactive, fundi not visualized, oral mucosa unremarkable NECK:  No jugular venous distention, waveform within normal limits, carotid upstroke brisk and symmetric, no bruits LUNGS:  Clear to auscultation bilaterally.  No crackles, wheezes or rhonchi.   HEART:  RRR.  PMI not displaced or sustained,S1 and S2 within normal limits, no S3, no S4, no clicks, no rubs, no murmurs ABD:  Flat, positive bowel sounds normal in frequency in pitch, no bruits, no rebound, no guarding, no midline pulsatile mass, no hepatomegaly, no splenomegaly EXT:  2 plus pulses throughout, no edema, no cyanosis no clubbing SKIN:  No rashes no nodules NEURO:  Cranial nerves II through XII grossly intact.  Bilateral lower extremity weakness and muscle wasting.   PSYCH:  Cognitively intact, oriented to person place and time   EKG:  EKG is not ordered today. The ekg ordered today demonstrates sinus rhythm.  61 bpm.  L axis deviation.  Low voltage. 07/31/16: Sinus rhythm. Rate 66.  Recent Labs: 07/30/2016: ALT 51; B Natriuretic Peptide 51.7; Hemoglobin 13.3; Platelets 419 01/24/2017: BUN 11; Creatinine, Ser 0.54; Potassium 4.7; Sodium 136    Lipid Panel No results found for: CHOL, TRIG, HDL,  CHOLHDL, VLDL, LDLCALC, LDLDIRECT   11/23/14: Chol 111, tri 106, hdl 42, ldl 48 Hgb A1c 5.5  03/23/16:   Sodium 138, potassium 4.7, BUN 13, creatinine 0.49 AST 33, ALT 64 Total cholesterol 101, triglycerides 72, HDL 44, LDL 43  02/20/17: Sodium 138, potassium 4.3, BUN 12, creatinine 0.6 on AST 14, ALT 29 Hemoglobin A1c 5.6% Total cholesterol 120, triglycerides 63, HDL 47, LDL 60 TSH 1.60   Wt Readings from Last 3 Encounters:  02/26/17 79.4 kg (175 lb)  01/25/17 83.5 kg (184 lb)  01/18/17 83.5 kg (184 lb)    TTE 01/31/10: EF 55-60%.  No wall motion abnormalities.  Grade 1 diastolic dysfunction.  Calcification of anterior mitral chordae.  Mildly elevated PASP.  Lexiscan Myoview 08/15/16: The left ventricular ejection fraction is hyperdynamic (>65%).  Nuclear stress EF: 67%.  There was no ST segment deviation noted during stress.  Defect 1: There is a small defect of mild severity present in the basal inferior and mid inferior location.  Findings consistent with ischemia.  This is a low risk study.  Cardiac CT-A 08/29/16:  IMPRESSION: 1) Left dominant coronary arteries with non obstructive disease in proximal and mid LAD  2) Calcium score 56 which is 59th percentile for age and sex  3) Normal aortic root 31 mm   Other studies Reviewed: Additional studies/ records that were reviewed today include: medical record. Review of the above records demonstrates:  Please see elsewhere in the note.     ASSESSMENT AND PLAN:  # Hypertension: BP is well-controlled on furosemide, telmisartan and carvedilol.    # Depression/Anxiety: Ms. Kushnir has been very stressed and anxious lately.  She has been taking clonazepam but still has symptoms.  I recommended that she talk with her PCP about considering an SSRI.  She wants to do this.   # Chest pain: # Non-obstructive CAD: Symptoms improved.  Cardiac CT-A 08/2016 was very reassuring. She  has non--obstructive coronary disease. Her  symptoms are likely due to noncardiac chest pain.  Continue aspirin 81 mg daily and simvastatin 20 mg daily at bedtime.  # Hyperlipidemia:  LDL 43 03/2016.  Continue simvastatin.  She will have repeat lipids with Dr. Shelia Media.  # Takotsubo cardiomyopathy: Resolved.  LVEF 67% 08/2016.    Current medicines are reviewed at length with the patient today.  The patient does not have concerns regarding medicines.  The following changes have been made:  See above  Labs/ tests ordered today include:    No orders of the defined types were placed in this encounter.    Disposition:   FU with Dr. Jonelle Sidle C. [place] in 6 months.  Signed, Skeet Latch, MD  02/28/2017 10:50 PM    [facility]

## 2017-04-02 DIAGNOSIS — N3020 Other chronic cystitis without hematuria: Secondary | ICD-10-CM | POA: Diagnosis not present

## 2017-04-02 DIAGNOSIS — N261 Atrophy of kidney (terminal): Secondary | ICD-10-CM | POA: Diagnosis not present

## 2017-04-02 DIAGNOSIS — G8220 Paraplegia, unspecified: Secondary | ICD-10-CM | POA: Diagnosis not present

## 2017-04-02 DIAGNOSIS — N319 Neuromuscular dysfunction of bladder, unspecified: Secondary | ICD-10-CM | POA: Diagnosis not present

## 2017-04-03 DIAGNOSIS — H04123 Dry eye syndrome of bilateral lacrimal glands: Secondary | ICD-10-CM | POA: Diagnosis not present

## 2017-05-01 DIAGNOSIS — L309 Dermatitis, unspecified: Secondary | ICD-10-CM | POA: Diagnosis not present

## 2017-05-03 ENCOUNTER — Other Ambulatory Visit: Payer: Self-pay | Admitting: Obstetrics and Gynecology

## 2017-05-03 DIAGNOSIS — Z139 Encounter for screening, unspecified: Secondary | ICD-10-CM

## 2017-05-24 ENCOUNTER — Other Ambulatory Visit: Payer: Self-pay | Admitting: Obstetrics and Gynecology

## 2017-05-24 DIAGNOSIS — N644 Mastodynia: Secondary | ICD-10-CM

## 2017-05-31 ENCOUNTER — Ambulatory Visit: Payer: Medicare Other

## 2017-05-31 DIAGNOSIS — I10 Essential (primary) hypertension: Secondary | ICD-10-CM | POA: Diagnosis not present

## 2017-05-31 DIAGNOSIS — Z0000 Encounter for general adult medical examination without abnormal findings: Secondary | ICD-10-CM | POA: Diagnosis not present

## 2017-05-31 DIAGNOSIS — E119 Type 2 diabetes mellitus without complications: Secondary | ICD-10-CM | POA: Diagnosis not present

## 2017-05-31 DIAGNOSIS — E785 Hyperlipidemia, unspecified: Secondary | ICD-10-CM | POA: Diagnosis not present

## 2017-05-31 DIAGNOSIS — E559 Vitamin D deficiency, unspecified: Secondary | ICD-10-CM | POA: Diagnosis not present

## 2017-06-01 ENCOUNTER — Other Ambulatory Visit: Payer: Self-pay | Admitting: Cardiovascular Disease

## 2017-06-01 NOTE — Telephone Encounter (Signed)
Refill Request.  

## 2017-06-04 DIAGNOSIS — E559 Vitamin D deficiency, unspecified: Secondary | ICD-10-CM | POA: Diagnosis not present

## 2017-06-04 DIAGNOSIS — E119 Type 2 diabetes mellitus without complications: Secondary | ICD-10-CM | POA: Diagnosis not present

## 2017-06-04 DIAGNOSIS — Z0000 Encounter for general adult medical examination without abnormal findings: Secondary | ICD-10-CM | POA: Diagnosis not present

## 2017-06-04 DIAGNOSIS — E785 Hyperlipidemia, unspecified: Secondary | ICD-10-CM | POA: Diagnosis not present

## 2017-06-05 ENCOUNTER — Other Ambulatory Visit: Payer: Medicare Other

## 2017-06-06 ENCOUNTER — Ambulatory Visit
Admission: RE | Admit: 2017-06-06 | Discharge: 2017-06-06 | Disposition: A | Discharge status: Home / Self Care | Payer: Medicare Other | Source: Ambulatory Visit | Attending: Obstetrics and Gynecology | Admitting: Obstetrics and Gynecology

## 2017-06-06 DIAGNOSIS — N644 Mastodynia: Secondary | ICD-10-CM | POA: Diagnosis not present

## 2017-06-06 DIAGNOSIS — R928 Other abnormal and inconclusive findings on diagnostic imaging of breast: Secondary | ICD-10-CM | POA: Diagnosis not present

## 2017-06-07 DIAGNOSIS — E785 Hyperlipidemia, unspecified: Secondary | ICD-10-CM | POA: Diagnosis not present

## 2017-06-07 DIAGNOSIS — I10 Essential (primary) hypertension: Secondary | ICD-10-CM | POA: Diagnosis not present

## 2017-06-07 DIAGNOSIS — E119 Type 2 diabetes mellitus without complications: Secondary | ICD-10-CM | POA: Diagnosis not present

## 2017-06-07 DIAGNOSIS — E039 Hypothyroidism, unspecified: Secondary | ICD-10-CM | POA: Diagnosis not present

## 2017-06-26 DIAGNOSIS — E119 Type 2 diabetes mellitus without complications: Secondary | ICD-10-CM | POA: Diagnosis not present

## 2017-06-26 DIAGNOSIS — E039 Hypothyroidism, unspecified: Secondary | ICD-10-CM | POA: Diagnosis not present

## 2017-06-26 DIAGNOSIS — E785 Hyperlipidemia, unspecified: Secondary | ICD-10-CM | POA: Diagnosis not present

## 2017-06-26 DIAGNOSIS — I10 Essential (primary) hypertension: Secondary | ICD-10-CM | POA: Diagnosis not present

## 2017-08-01 DIAGNOSIS — H25013 Cortical age-related cataract, bilateral: Secondary | ICD-10-CM | POA: Diagnosis not present

## 2017-08-01 DIAGNOSIS — H40033 Anatomical narrow angle, bilateral: Secondary | ICD-10-CM | POA: Diagnosis not present

## 2017-08-01 DIAGNOSIS — E119 Type 2 diabetes mellitus without complications: Secondary | ICD-10-CM | POA: Diagnosis not present

## 2017-08-01 DIAGNOSIS — H2513 Age-related nuclear cataract, bilateral: Secondary | ICD-10-CM | POA: Diagnosis not present

## 2017-08-15 ENCOUNTER — Encounter: Payer: Self-pay | Admitting: Cardiovascular Disease

## 2017-08-15 ENCOUNTER — Ambulatory Visit: Payer: Medicare Other | Admitting: Cardiovascular Disease

## 2017-08-15 VITALS — BP 134/68 | HR 58 | Ht 60.0 in | Wt 162.0 lb

## 2017-08-15 DIAGNOSIS — R079 Chest pain, unspecified: Secondary | ICD-10-CM

## 2017-08-15 DIAGNOSIS — I10 Essential (primary) hypertension: Secondary | ICD-10-CM | POA: Diagnosis not present

## 2017-08-15 DIAGNOSIS — I2510 Atherosclerotic heart disease of native coronary artery without angina pectoris: Secondary | ICD-10-CM

## 2017-08-15 DIAGNOSIS — E7800 Pure hypercholesterolemia, unspecified: Secondary | ICD-10-CM

## 2017-08-15 NOTE — Progress Notes (Signed)
Cardiology Office Note   Date:  08/15/2017   ID:  [name], DOB [birth year], MRN [id number]  PCP:  Deland Pretty, MD  Cardiologist:   Skeet Latch, MD   No chief complaint on file.    History of Present Illness: [name] is a 72 y.o. female with HTN, Takotsubo syndrome, DM, post-Polio syndrome, and hypothyroidism who presents for follow up. [name] was first seen 12/2014 for hypertension and atypical chest pain.  At that appointment she had lower extremity edema.  Furosemide 3 times weekly was added to her regimen.  She also reported atypical chest pain and was referred for [facility] 12/31/14 that revealed LVEF 71% with no evidence of ischemia was seen in the ED 07/30/16.  [name] was seen in the ED 07/2016 with chest tightness and poorly controlled blood pressure. Cardiac enzymes were negative. Her EKG showed nonspecific ST-T changes. She was instructed to follow-up with cardiology as an outpatient.  She had a Lexiscan Myoview 08/15/16 that revealed LVEF 67% with concern for basal to mid inferior ischemia.  She subsequently had a cardiac CT-A that revealed 30% LAD stenosis and no obstructive disease.    Metoprolol was switched to carvedilol due to high blood pressures and low heart rates.  Amlodipine was discontinued due to lower extremity edema, which has improved since making that change.  She does arm exercises 2 or 3 times per week for 30 minutes each time.  She has no chest pain or shortness of breath with this activity.  She has not noted any recurrent lower extremity edema.  She has no orthopnea or PND.  Her blood pressure at home has been well-controlled.  She does complain of intermittent headaches.  She is otherwise well.  Past Medical History:  Diagnosis Date  . Angina pectoris ([place]) 08/02/2016  . Anxiety   . Arthritis    "hands, knees, back" (07/18/2013)  . Asthma   . CHF (congestive heart failure) ([place])   . Chronic back pain   . Chronic lower back pain   .  Family history of anesthesia complication    Mother and Sister- N/V  . GERD (gastroesophageal reflux disease)   . H/O hiatal hernia   . LKGMWNUU(725.3)    "weekly" (07/18/2013)  . Hyperlipemia   . Hypertension   . Hypothyroidism   . Insomnia   . Kidney stones   . Mental disorder   . Migraine    "years ago" (07/18/2013)  . Osteopenia   . Post-polio syndrome   . Post-polio syndrome   . Sleep apnea   . Takotsubo cardiomyopathy   . Type II diabetes mellitus ([place])     Past Surgical History:  Procedure Laterality Date  . ABDOMINAL HYSTERECTOMY    . APPENDECTOMY    . CARPAL TUNNEL RELEASE Bilateral   . CHOLECYSTECTOMY    . COLONOSCOPY    . CYSTOSCOPY    . KNEE ARTHROSCOPY Left   . KNEE SURGERY Left   . LEG SURGERY Left    polio  . ORIF PATELLA Left 07/18/2013   Procedure: OPEN REDUCTION INTERNAL (ORIF) LEFT FIXATION PATELLA;  Surgeon: Augustin Schooling, MD;  Location: [facility];  Service: Orthopedics;  Laterality: Left;  . ORIF PATELLA FRACTURE Left 07/18/2013  . TUBAL LIGATION    . WISDOM TOOTH EXTRACTION    . WRIST TENDON TRANSFER Right      Current Outpatient Medications  Medication Sig Dispense Refill  . aspirin 81 MG tablet Take 81 mg by  mouth at bedtime.     Marland Kitchen b complex vitamins tablet Take 1 tablet by mouth daily.    . benzonatate (TESSALON) 100 MG capsule Take 100 mg by mouth 3 (three) times daily as needed for cough.    . Calcium Carbonate-Vitamin D (CALTRATE 600+D) 600-400 MG-UNIT per chew tablet Chew 1 tablet by mouth 2 (two) times daily.     . carvedilol (COREG) 12.5 MG tablet TAKE 1 TABLET BY MOUTH TWICE DAILY. 180 tablet 2  . clonazePAM (KLONOPIN) 0.5 MG tablet Take 0.5 mg by mouth 2 (two) times daily.     . fish oil-omega-3 fatty acids 1000 MG capsule Take 2 g by mouth 2 (two) times daily.      . furosemide (LASIX) 20 MG tablet Take 1 tablet (20 mg total) by mouth daily. 90 tablet 1  . levothyroxine (SYNTHROID, LEVOTHROID) 100 MCG tablet Take 100 mcg by mouth daily  before breakfast.    . metFORMIN (GLUCOPHAGE) 500 MG tablet Take 1,000 mg by mouth 2 (two) times daily with a meal.     . mirabegron ER (MYRBETRIQ) 50 MG TB24 tablet Take 50 mg by mouth daily.    . Multiple Vitamins-Minerals (MULTIVITAMIN WITH MINERALS) tablet Take 1 tablet by mouth daily.      . nitroGLYCERIN (NITROSTAT) 0.4 MG SL tablet Place 0.4 mg under the tongue every 5 (five) minutes as needed for chest pain.    Marland Kitchen nystatin cream (MYCOSTATIN) Apply 1 application topically daily as needed for dry skin (irritation. boils on buttocks). Use as needed  0  . pantoprazole (PROTONIX) 40 MG tablet Take 1 tablet by mouth daily. Take 1 tab daily  98  . sertraline (ZOLOFT) 50 MG tablet Take 50 mg by mouth daily.    . simvastatin (ZOCOR) 20 MG tablet Take 20 mg by mouth daily.    Marland Kitchen telmisartan (MICARDIS) 80 MG tablet Take 1 tablet by mouth daily. Take 1 tab daily  0  . tiZANidine (ZANAFLEX) 4 MG tablet Take 4 mg by mouth 5 (five) times daily as needed for muscle spasms. PAIN    . vitamin E (VITAMIN E) 400 UNIT capsule Take 400 Units by mouth daily.    Marland Kitchen zolpidem (AMBIEN) 5 MG tablet Take 5 mg by mouth at bedtime as needed for sleep.     No current facility-administered medications for this visit.     Allergies:   Ciprofloxacin; Penicillins; Sulfa antibiotics; Acetaminophen; Aspirin; Macrodantin [nitrofurantoin macrocrystal]; Nsaids; Percocet [oxycodone-acetaminophen]; Propoxyphene n-acetaminophen; Soma compound [carisoprodol-aspirin]; and Tolectin [tolmetin]    Social History:  The patient  reports that she has never smoked. She has never used smokeless tobacco. She reports that she does not drink alcohol or use drugs.   Family History:  The patient's family history includes Allergies in her grandchild; Cancer in her father; Diabetes in her father; Heart failure in her father and mother; Parkinson's disease in her mother.    ROS:  Please see the history of present illness.   Otherwise, review of  systems are positive for back pain, leg pain, headaches.   All other systems are reviewed and negative.    PHYSICAL EXAM: VS:  BP 134/68   Pulse (!) 58   Ht 5' (1.524 m)   Wt 162 lb (73.5 kg) Comment: patient reported  BMI 31.64 kg/m  , BMI Body mass index is 31.64 kg/m. GENERAL:  Well appearing.  Obese.  Exam performed as patient sat in her scooter HEENT:  Pupils equal round and reactive,  fundi not visualized, oral mucosa unremarkable NECK:  No jugular venous distention, waveform within normal limits, carotid upstroke brisk and symmetric, no bruits LUNGS:  Clear to auscultation bilaterally.  No crackles, wheezes or rhonchi.   HEART:  RRR.  PMI not displaced or sustained,S1 and S2 within normal limits, no S3, no S4, no clicks, no rubs, no murmurs ABD:  Flat, positive bowel sounds normal in frequency in pitch, no bruits, no rebound, no guarding, no midline pulsatile mass, no hepatomegaly, no splenomegaly EXT:  2 plus pulses throughout, no edema, no cyanosis no clubbing SKIN:  No rashes no nodules NEURO:  Cranial nerves II through XII grossly intact.  Bilateral lower extremity weakness and muscle wasting.   PSYCH:  Cognitively intact, oriented to person place and time   EKG:  EKG is ordered today. The ekg ordered today demonstrates sinus rhythm.  61 bpm.  L axis deviation.  Low voltage. 07/31/16: Sinus rhythm. Rate 66. 08/15/17: Sinus bradycardia.  Rate 58 bpm.   Recent Labs: 01/24/2017: BUN 11; Creatinine, Ser 0.54; Potassium 4.7; Sodium 136    Lipid Panel No results found for: CHOL, TRIG, HDL, CHOLHDL, VLDL, LDLCALC, LDLDIRECT   11/23/14: Chol 111, tri 106, hdl 42, ldl 48 Hgb A1c 5.5  03/23/16:   Sodium 138, potassium 4.7, BUN 13, creatinine 0.49 AST 33, ALT 64 Total cholesterol 101, triglycerides 72, HDL 44, LDL 43  02/20/17: Sodium 138, potassium 4.3, BUN 12, creatinine 0.6 on AST 14, ALT 29 Hemoglobin A1c 5.6% Total cholesterol 120, triglycerides 63, HDL 47, LDL 60 TSH  1.60   Wt Readings from Last 3 Encounters:  08/15/17 162 lb (73.5 kg)  02/26/17 175 lb (79.4 kg)  01/25/17 184 lb (83.5 kg)    TTE 01/31/10: EF 55-60%.  No wall motion abnormalities.  Grade 1 diastolic dysfunction.  Calcification of anterior mitral chordae.  Mildly elevated PASP.  Lexiscan Myoview 08/15/16: The left ventricular ejection fraction is hyperdynamic (>65%).  Nuclear stress EF: 67%.  There was no ST segment deviation noted during stress.  Defect 1: There is a small defect of mild severity present in the basal inferior and mid inferior location.  Findings consistent with ischemia.  This is a low risk study.  Cardiac CT-A 08/29/16:  IMPRESSION: 1) Left dominant coronary arteries with non obstructive disease in proximal and mid LAD  2) Calcium score 56 which is 59th percentile for age and sex  3) Normal aortic root 31 mm   Other studies Reviewed: Additional studies/ records that were reviewed today include: medical record. Review of the above records demonstrates:  Please see elsewhere in the note.     ASSESSMENT AND PLAN:   # Hypertension: BP is well-controlled on furosemide, telmisartan and carvedilol.  No changes.   # Chest pain: # Non-obstructive CAD: Resolved. Cardiac CT-A 08/2016 was very reassuring. She has non--obstructive coronary disease. Her symptoms are likely due to stress and anxiety.  Continue aspirin 81 mg daily and simvastatin 20 mg qhs.   # Hyperlipidemia:  LDL 60 02/2017. Continue simvastatin.  Dr. Shelia Media follows her lipids.   # Takotsubo cardiomyopathy: Resolved.  LVEF 67% 08/2016.    Current medicines are reviewed at length with the patient today.  The patient does not have concerns regarding medicines.  The following changes have been made:  none  Labs/ tests ordered today include:    Orders Placed This Encounter  Procedures  . EKG 12-Lead     Disposition:   FU with Dr. Jonelle Sidle C. [place]  in 1 year.  Signed, Skeet Latch, MD  08/15/2017 4:47 PM    [place]

## 2017-08-15 NOTE — Patient Instructions (Signed)

## 2017-08-27 DIAGNOSIS — E7800 Pure hypercholesterolemia, unspecified: Secondary | ICD-10-CM | POA: Diagnosis not present

## 2017-08-27 DIAGNOSIS — I10 Essential (primary) hypertension: Secondary | ICD-10-CM | POA: Diagnosis not present

## 2017-08-27 DIAGNOSIS — E785 Hyperlipidemia, unspecified: Secondary | ICD-10-CM | POA: Diagnosis not present

## 2017-08-27 DIAGNOSIS — E119 Type 2 diabetes mellitus without complications: Secondary | ICD-10-CM | POA: Diagnosis not present

## 2017-09-03 DIAGNOSIS — E876 Hypokalemia: Secondary | ICD-10-CM | POA: Diagnosis not present

## 2017-09-03 DIAGNOSIS — E871 Hypo-osmolality and hyponatremia: Secondary | ICD-10-CM | POA: Diagnosis not present

## 2017-09-24 ENCOUNTER — Emergency Department (HOSPITAL_COMMUNITY): Payer: Medicare Other

## 2017-09-24 ENCOUNTER — Encounter (HOSPITAL_COMMUNITY): Payer: Self-pay | Admitting: Emergency Medicine

## 2017-09-24 ENCOUNTER — Emergency Department (HOSPITAL_COMMUNITY)
Admission: EM | Admit: 2017-09-24 | Discharge: 2017-09-25 | Disposition: A | Discharge status: Home / Self Care | Payer: Medicare Other | Attending: Emergency Medicine | Admitting: Emergency Medicine

## 2017-09-24 DIAGNOSIS — R10812 Left upper quadrant abdominal tenderness: Secondary | ICD-10-CM | POA: Insufficient documentation

## 2017-09-24 DIAGNOSIS — Y9239 Other specified sports and athletic area as the place of occurrence of the external cause: Secondary | ICD-10-CM | POA: Insufficient documentation

## 2017-09-24 DIAGNOSIS — M7989 Other specified soft tissue disorders: Secondary | ICD-10-CM | POA: Diagnosis not present

## 2017-09-24 DIAGNOSIS — I110 Hypertensive heart disease with heart failure: Secondary | ICD-10-CM | POA: Diagnosis not present

## 2017-09-24 DIAGNOSIS — S3991XA Unspecified injury of abdomen, initial encounter: Secondary | ICD-10-CM | POA: Diagnosis not present

## 2017-09-24 DIAGNOSIS — Y9353 Activity, golf: Secondary | ICD-10-CM | POA: Insufficient documentation

## 2017-09-24 DIAGNOSIS — S299XXA Unspecified injury of thorax, initial encounter: Secondary | ICD-10-CM | POA: Diagnosis not present

## 2017-09-24 DIAGNOSIS — S199XXA Unspecified injury of neck, initial encounter: Secondary | ICD-10-CM | POA: Diagnosis not present

## 2017-09-24 DIAGNOSIS — R51 Headache: Secondary | ICD-10-CM | POA: Insufficient documentation

## 2017-09-24 DIAGNOSIS — Y998 Other external cause status: Secondary | ICD-10-CM | POA: Diagnosis not present

## 2017-09-24 DIAGNOSIS — S93402A Sprain of unspecified ligament of left ankle, initial encounter: Secondary | ICD-10-CM | POA: Diagnosis not present

## 2017-09-24 DIAGNOSIS — E119 Type 2 diabetes mellitus without complications: Secondary | ICD-10-CM | POA: Insufficient documentation

## 2017-09-24 DIAGNOSIS — S161XXA Strain of muscle, fascia and tendon at neck level, initial encounter: Secondary | ICD-10-CM | POA: Insufficient documentation

## 2017-09-24 DIAGNOSIS — I509 Heart failure, unspecified: Secondary | ICD-10-CM | POA: Diagnosis not present

## 2017-09-24 DIAGNOSIS — S8990XA Unspecified injury of unspecified lower leg, initial encounter: Secondary | ICD-10-CM | POA: Diagnosis not present

## 2017-09-24 DIAGNOSIS — Z419 Encounter for procedure for purposes other than remedying health state, unspecified: Secondary | ICD-10-CM

## 2017-09-24 DIAGNOSIS — I10 Essential (primary) hypertension: Secondary | ICD-10-CM | POA: Diagnosis not present

## 2017-09-24 DIAGNOSIS — S069X0A Unspecified intracranial injury without loss of consciousness, initial encounter: Secondary | ICD-10-CM | POA: Diagnosis not present

## 2017-09-24 DIAGNOSIS — S239XXA Sprain of unspecified parts of thorax, initial encounter: Secondary | ICD-10-CM | POA: Diagnosis not present

## 2017-09-24 DIAGNOSIS — Z8612 Personal history of poliomyelitis: Secondary | ICD-10-CM | POA: Diagnosis not present

## 2017-09-24 DIAGNOSIS — S233XXA Sprain of ligaments of thoracic spine, initial encounter: Secondary | ICD-10-CM | POA: Diagnosis not present

## 2017-09-24 DIAGNOSIS — T148XXA Other injury of unspecified body region, initial encounter: Secondary | ICD-10-CM | POA: Diagnosis not present

## 2017-09-24 DIAGNOSIS — S9002XA Contusion of left ankle, initial encounter: Secondary | ICD-10-CM | POA: Diagnosis not present

## 2017-09-24 LAB — COMPREHENSIVE METABOLIC PANEL
ALT: 19 U/L (ref 14–54)
ANION GAP: 10 (ref 5–15)
AST: 19 U/L (ref 15–41)
Albumin: 3.8 g/dL (ref 3.5–5.0)
Alkaline Phosphatase: 63 U/L (ref 38–126)
BILIRUBIN TOTAL: 0.9 mg/dL (ref 0.3–1.2)
BUN: 15 mg/dL (ref 6–20)
CALCIUM: 9.7 mg/dL (ref 8.9–10.3)
CO2: 27 mmol/L (ref 22–32)
CREATININE: 0.68 mg/dL (ref 0.44–1.00)
Chloride: 97 mmol/L — ABNORMAL LOW (ref 101–111)
GFR calc Af Amer: >60 mL/min (ref >60)
GFR calc non Af Amer: >60 mL/min (ref >60)
Glucose, Bld: 110 mg/dL — ABNORMAL HIGH (ref 65–99)
Potassium: 4.1 mmol/L (ref 3.5–5.1)
Sodium: 134 mmol/L — ABNORMAL LOW (ref 135–145)
TOTAL PROTEIN: 7.3 g/dL (ref 6.5–8.1)

## 2017-09-24 LAB — I-STAT CHEM 8, ED
BUN: 16 mg/dL (ref 6–20)
CHLORIDE: 97 mmol/L — AB (ref 101–111)
CREATININE: 0.6 mg/dL (ref 0.44–1.00)
Calcium, Ion: 1.17 mmol/L (ref 1.15–1.40)
Glucose, Bld: 103 mg/dL — ABNORMAL HIGH (ref 65–99)
HEMATOCRIT: 39 % (ref 36.0–46.0)
Hemoglobin: 13.3 g/dL (ref 12.0–15.0)
Potassium: 4 mmol/L (ref 3.5–5.1)
Sodium: 136 mmol/L (ref 135–145)
TCO2: 26 mmol/L (ref 22–32)

## 2017-09-24 LAB — CBC WITH DIFFERENTIAL/PLATELET
BASOS PCT: 0 %
Basophils Absolute: 0 10*3/uL (ref 0.0–0.1)
Eosinophils Absolute: 0.6 10*3/uL (ref 0.0–0.7)
Eosinophils Relative: 5 %
HEMATOCRIT: 39.1 % (ref 36.0–46.0)
HEMOGLOBIN: 13.1 g/dL (ref 12.0–15.0)
Lymphocytes Relative: 19 %
Lymphs Abs: 2.3 10*3/uL (ref 0.7–4.0)
MCH: 29.6 pg (ref 26.0–34.0)
MCHC: 33.5 g/dL (ref 30.0–36.0)
MCV: 88.5 fL (ref 78.0–100.0)
MONOS PCT: 8 %
Monocytes Absolute: 1 10*3/uL (ref 0.1–1.0)
NEUTROS ABS: 8.5 10*3/uL — AB (ref 1.7–7.7)
NEUTROS PCT: 68 %
Platelets: 467 10*3/uL — ABNORMAL HIGH (ref 150–400)
RBC: 4.42 MIL/uL (ref 3.87–5.11)
RDW: 15.9 % — ABNORMAL HIGH (ref 11.5–15.5)
WBC: 12.5 10*3/uL — ABNORMAL HIGH (ref 4.0–10.5)

## 2017-09-24 MED ORDER — TRAMADOL HCL 50 MG PO TABS: 50.0000 mg | ORAL_TABLET | Freq: Four times a day (QID) | ORAL | 0 refills | Status: DC | PRN

## 2017-09-24 MED ORDER — IOHEXOL 300 MG/ML  SOLN
100.0000 mL | Freq: Once | INTRAMUSCULAR | Status: AC | PRN
  Administered 2017-09-24: 100 mL via INTRAVENOUS

## 2017-09-24 MED ORDER — TRAMADOL HCL 50 MG PO TABS
50.0000 mg | ORAL_TABLET | Freq: Once | ORAL | Status: AC
  Administered 2017-09-24: 50 mg via ORAL
  Filled 2017-09-24: qty 1

## 2017-09-24 NOTE — ED Notes (Signed)
Pt returned to [facility] from X-ray
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
## 2017-09-24 NOTE — ED Notes (Signed)
Patient transported to CT scan . 
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
## 2017-09-25 ENCOUNTER — Emergency Department (HOSPITAL_COMMUNITY): Payer: Medicare Other

## 2017-09-25 DIAGNOSIS — M7989 Other specified soft tissue disorders: Secondary | ICD-10-CM | POA: Diagnosis not present

## 2017-09-25 NOTE — Progress Notes (Signed)
Orthopedic Tech Progress Note Patient Details:  [name] June 02, 1945 [id number]  Ortho Devices Type of Ortho Device: Ace wrap Ortho Device/Splint Location: lle ankle Ortho Device/Splint Interventions: Ordered, Application, Adjustment   Post Interventions Patient Tolerated: Well Instructions Provided: Care of device, Adjustment of device   Karolee Stamps 09/25/2017, 12:27 AM

## 2017-09-25 NOTE — ED Notes (Signed)
Dr, Dina Rich notified on pt.'s complain of right 5th finger joint pain with mild swelling at time of discharge .

## 2017-10-29 DIAGNOSIS — I10 Essential (primary) hypertension: Secondary | ICD-10-CM | POA: Diagnosis not present

## 2017-10-29 DIAGNOSIS — E119 Type 2 diabetes mellitus without complications: Secondary | ICD-10-CM | POA: Diagnosis not present

## 2017-10-29 DIAGNOSIS — E7800 Pure hypercholesterolemia, unspecified: Secondary | ICD-10-CM | POA: Diagnosis not present

## 2017-12-06 DIAGNOSIS — E785 Hyperlipidemia, unspecified: Secondary | ICD-10-CM | POA: Diagnosis not present

## 2017-12-06 DIAGNOSIS — E119 Type 2 diabetes mellitus without complications: Secondary | ICD-10-CM | POA: Diagnosis not present

## 2017-12-06 DIAGNOSIS — I10 Essential (primary) hypertension: Secondary | ICD-10-CM | POA: Diagnosis not present

## 2017-12-11 DIAGNOSIS — M6281 Muscle weakness (generalized): Secondary | ICD-10-CM | POA: Diagnosis not present

## 2018-01-21 ENCOUNTER — Telehealth: Payer: Self-pay | Admitting: Cardiovascular Disease

## 2018-01-21 NOTE — Telephone Encounter (Signed)
Patient has been having problems with headaches for couple of weeks. She can not get her blood pressure machine to work so she has not been checking at home. She was at her brothers Saturday and used his machine, reading of 192/90 and yesterday with neighbors machine 173/72. One of the monitors showed the heart having a cross in it and she was told that meant there was a problem with her heart. Did ask if she had any rhythm issues in the past. Per patient she was told she had Afib when she was in the hospital previously. She denies feeling her heart racing, sometimes feels flutters. Did schedule first available with Dr Mullens/NP/PA. Will forward to Dr Ron Parker D for review

## 2018-01-21 NOTE — Telephone Encounter (Signed)
Patient has appt on Friday with Dr. Oval Linsey.  Would recommend to continue current meds, keep BP log and bring log and cuff to Friday appt.

## 2018-01-21 NOTE — Telephone Encounter (Signed)
New message:      Pt c/o BP issue: STAT if pt c/o blurred vision, one-sided weakness or slurred speech  1. What are your last 5 BP readings? 173/72, 192/90  2. Are you having any other symptoms (ex. Dizziness, headache, blurred vision, passed out)? headaches  3. What is your BP issue? Pt states her bp has been running high and she is having some headaches.

## 2018-01-23 DIAGNOSIS — R109 Unspecified abdominal pain: Secondary | ICD-10-CM | POA: Diagnosis not present

## 2018-01-23 DIAGNOSIS — R197 Diarrhea, unspecified: Secondary | ICD-10-CM | POA: Diagnosis not present

## 2018-01-23 DIAGNOSIS — R1032 Left lower quadrant pain: Secondary | ICD-10-CM | POA: Diagnosis not present

## 2018-01-23 DIAGNOSIS — Z23 Encounter for immunization: Secondary | ICD-10-CM | POA: Diagnosis not present

## 2018-01-23 DIAGNOSIS — R748 Abnormal levels of other serum enzymes: Secondary | ICD-10-CM | POA: Diagnosis not present

## 2018-01-23 DIAGNOSIS — Z79899 Other long term (current) drug therapy: Secondary | ICD-10-CM | POA: Diagnosis not present

## 2018-01-24 NOTE — Telephone Encounter (Signed)
agree

## 2018-01-25 ENCOUNTER — Encounter: Payer: Self-pay | Admitting: Cardiovascular Disease

## 2018-01-25 ENCOUNTER — Ambulatory Visit: Payer: Medicare Other | Admitting: Cardiovascular Disease

## 2018-01-25 VITALS — BP 126/57 | HR 55 | Ht 60.0 in | Wt 185.0 lb

## 2018-01-25 DIAGNOSIS — E7800 Pure hypercholesterolemia, unspecified: Secondary | ICD-10-CM | POA: Diagnosis not present

## 2018-01-25 DIAGNOSIS — I2510 Atherosclerotic heart disease of native coronary artery without angina pectoris: Secondary | ICD-10-CM | POA: Diagnosis not present

## 2018-01-25 DIAGNOSIS — Z5181 Encounter for therapeutic drug level monitoring: Secondary | ICD-10-CM

## 2018-01-25 DIAGNOSIS — I10 Essential (primary) hypertension: Secondary | ICD-10-CM

## 2018-01-25 MED ORDER — HYDROCHLOROTHIAZIDE 25 MG PO TABS: 25.0000 mg | ORAL_TABLET | Freq: Every day | ORAL | 0 refills | Status: DC

## 2018-01-25 NOTE — Addendum Note (Signed)
Addended by: Alvina Filbert B on: 01/25/2018 05:52 PM   Modules accepted: Orders

## 2018-01-25 NOTE — Patient Instructions (Signed)
Medication Instructions:  STOP FUROSEMIDE   START HYDROCHLOROTHIAZIDE 25 MG DAILY   Labwork: BMET IN 1 WEEK   Testing/Procedures: NONE  Follow-Up: Your physician recommends that you schedule a follow-up appointment in: 1 MONTH   If you need a refill on your cardiac medications before your next appointment, please call your pharmacy.

## 2018-01-25 NOTE — Progress Notes (Signed)
Cardiology Office Note   Date:  01/25/2018   ID:  [name], DOB Jun 21, 1945, MRN [id number]  PCP:  Kathy Pretty, MD  Cardiologist:   Kathy Latch, MD   No chief complaint on file.    History of Present Illness: [name] is a 72 y.o. female with HTN, Takotsubo syndrome, DM, post-Polio syndrome, and hypothyroidism who presents for follow up. [name] was first seen 12/2014 for hypertension and atypical chest pain.  At that appointment she had lower extremity edema.  Furosemide 3 times weekly was added to her regimen.  She also reported atypical chest pain and was referred for [facility] 12/31/14 that revealed LVEF 71% with no evidence of ischemia was seen in the ED 07/30/16.  [name] was seen in the ED 07/2016 with chest tightness and poorly controlled blood pressure. Cardiac enzymes were negative. Her EKG showed nonspecific ST-T changes. She was instructed to follow-up with cardiology as an outpatient.  She had a Lexiscan Myoview 08/15/16 that revealed LVEF 67% with concern for basal to mid inferior ischemia.  She subsequently had a cardiac CT-A that revealed 30% LAD stenosis and no obstructive disease.    Metoprolol was switched to carvedilol due to high blood pressures and low heart rates.  Amlodipine was discontinued due to lower extremity edema, which has improved since making that change.  Since her last appointment [name] has been suffering from headaches in the evenings.  Her blood pressure has been running as high as 200 at times.  She also thinks it gets elevated because her husband sometimes fusses at her and this makes her stressed.  She feels safe and he has never been physically abusive.  Her lower extremity edema has been better, though her right foot sometimes gets puffy.  She has no orthopnea or PND.  Her simvastatin was switched to atorvastatin and she has been tolerating this without difficulties.  She hasn't had any chest pain or pressure.    Past Medical  History:  Diagnosis Date  . Angina pectoris ([place]) 08/02/2016  . Anxiety   . Arthritis    "hands, knees, back" (07/18/2013)  . Asthma   . CHF (congestive heart failure) ([place])   . Chronic back pain   . Chronic lower back pain   . Family history of anesthesia complication    Mother and Sister- N/V  . GERD (gastroesophageal reflux disease)   . H/O hiatal hernia   . LOVFIEPP(295.1)    "weekly" (07/18/2013)  . Hyperlipemia   . Hypertension   . Hypothyroidism   . Insomnia   . Kidney stones   . Mental disorder   . Migraine    "years ago" (07/18/2013)  . Osteopenia   . Post-polio syndrome   . Post-polio syndrome   . Sleep apnea   . Takotsubo cardiomyopathy   . Type II diabetes mellitus ([place])     Past Surgical History:  Procedure Laterality Date  . ABDOMINAL HYSTERECTOMY    . APPENDECTOMY    . CARPAL TUNNEL RELEASE Bilateral   . CHOLECYSTECTOMY    . COLONOSCOPY    . CYSTOSCOPY    . KNEE ARTHROSCOPY Left   . KNEE SURGERY Left   . LEG SURGERY Left    polio  . ORIF PATELLA Left 07/18/2013   Procedure: OPEN REDUCTION INTERNAL (ORIF) LEFT FIXATION PATELLA;  Surgeon: Augustin Schooling, MD;  Location: [facility];  Service: Orthopedics;  Laterality: Left;  . ORIF PATELLA FRACTURE Left 07/18/2013  . TUBAL  LIGATION    . WISDOM TOOTH EXTRACTION    . WRIST TENDON TRANSFER Right      Current Outpatient Medications  Medication Sig Dispense Refill  . aspirin 81 MG tablet Take 81 mg by mouth at bedtime.     [name] atorvastatin (LIPITOR) 10 MG tablet Take 10 mg by mouth daily.    [name] b complex vitamins tablet Take 1 tablet by mouth daily.    . benzonatate (TESSALON) 100 MG capsule Take 100 mg by mouth 3 (three) times daily as needed for cough.    . Calcium Carbonate-Vitamin D (CALTRATE 600+D) 600-400 MG-UNIT per chew tablet Chew 1 tablet by mouth 2 (two) times daily.     . carvedilol (COREG) 12.5 MG tablet TAKE 1 TABLET BY MOUTH TWICE DAILY. 180 tablet 2  . clonazePAM (KLONOPIN) 0.5 MG tablet Take 0.5 mg by  mouth 2 (two) times daily.     . fish oil-omega-3 fatty acids 1000 MG capsule Take 2 g by mouth 2 (two) times daily.      . furosemide (LASIX) 20 MG tablet Take 1 tablet (20 mg total) by mouth daily. 90 tablet 1  . levothyroxine (SYNTHROID, LEVOTHROID) 100 MCG tablet Take 100 mcg by mouth daily before breakfast.    . metFORMIN (GLUCOPHAGE) 500 MG tablet Take 1,000 mg by mouth 2 (two) times daily with a meal.     . mirabegron ER (MYRBETRIQ) 50 MG TB24 tablet Take 50 mg by mouth daily.    . Multiple Vitamins-Minerals (MULTIVITAMIN WITH MINERALS) tablet Take 1 tablet by mouth daily.      . nitroGLYCERIN (NITROSTAT) 0.4 MG SL tablet Place 0.4 mg under the tongue every 5 (five) minutes as needed for chest pain.    [name] nystatin cream (MYCOSTATIN) Apply 1 application topically daily as needed for dry skin (irritation. boils on buttocks). Use as needed  0  . pantoprazole (PROTONIX) 40 MG tablet Take 1 tablet by mouth daily. Take 1 tab daily  98  . sertraline (ZOLOFT) 50 MG tablet Take 50 mg by mouth daily.    [name] telmisartan (MICARDIS) 80 MG tablet Take 1 tablet by mouth daily. Take 1 tab daily  0  . tiZANidine (ZANAFLEX) 4 MG tablet Take 4 mg by mouth 5 (five) times daily as needed for muscle spasms. PAIN    . traMADol (ULTRAM) 50 MG tablet Take 1 tablet (50 mg total) by mouth every 6 (six) hours as needed. 8 tablet 0  . vitamin E (VITAMIN E) 400 UNIT capsule Take 400 Units by mouth daily.    [name] zolpidem (AMBIEN) 5 MG tablet Take 5 mg by mouth at bedtime as needed for sleep.     No current facility-administered medications for this visit.     Allergies:   Ciprofloxacin; Carisoprodol-aspirin; Penicillins; Sulfa antibiotics; Acetaminophen; Aspirin; Macrodantin [nitrofurantoin macrocrystal]; Nsaids; Percocet [oxycodone-acetaminophen]; Propoxyphene; Propoxyphene n-acetaminophen; Soma compound [carisoprodol-aspirin]; and Tolectin [tolmetin]    Social History:  The patient  reports that she has never smoked.  She has never used smokeless tobacco. She reports that she does not drink alcohol or use drugs.   Family History:  The patient's family history includes Allergies in her grandchild; Cancer in her father; Diabetes in her father; Heart failure in her father and mother; Parkinson's disease in her mother.    ROS:  Please see the history of present illness.   Otherwise, review of systems are positive for back pain, leg pain, headaches.   All other systems are reviewed and negative.  PHYSICAL EXAM: VS:  BP (!) 126/57   Pulse (!) 55   Ht 5' (1.524 m)   Wt 185 lb (83.9 kg)   BMI 36.13 kg/m  , BMI Body mass index is 36.13 kg/m. GENERAL:  Well appearing.  Obese.  Exam performed as patient sat in her scooter HEENT:  Pupils equal round and reactive, fundi not visualized, oral mucosa unremarkable NECK:  No jugular venous distention, waveform within normal limits, carotid upstroke brisk and symmetric, no bruits LUNGS:  Clear to auscultation bilaterally.  No crackles, wheezes or rhonchi.   HEART:  RRR.  PMI not displaced or sustained,S1 and S2 within normal limits, no S3, no S4, no clicks, no rubs, no murmurs ABD:  Flat, positive bowel sounds normal in frequency in pitch, no bruits, no rebound, no guarding, no midline pulsatile mass, no hepatomegaly, no splenomegaly EXT:  2 plus pulses throughout, no edema, no cyanosis no clubbing SKIN:  No rashes no nodules NEURO:  Cranial nerves II through XII grossly intact.  Bilateral lower extremity weakness and muscle wasting.   PSYCH:  Cognitively intact, oriented to person place and time   EKG:  EKG is ordered today. The ekg ordered today demonstrates sinus rhythm.  61 bpm.  L axis deviation.  Low voltage. 07/31/16: Sinus rhythm. Rate 66. 08/15/17: Sinus bradycardia.  Rate 58 bpm.  01/25/18: Sinus bradycardia.  Rae 55 bpm.    Recent Labs: 09/24/2017: ALT 19; BUN 16; Creatinine, Ser 0.60; Hemoglobin 13.3; Platelets 467; Potassium 4.0; Sodium 136    Lipid  Panel No results found for: CHOL, TRIG, HDL, CHOLHDL, VLDL, LDLCALC, LDLDIRECT   11/23/14: Chol 111, tri 106, hdl 42, ldl 48 Hgb A1c 5.5  03/23/16:   Sodium 138, potassium 4.7, BUN 13, creatinine 0.49 AST 33, ALT 64 Total cholesterol 101, triglycerides 72, HDL 44, LDL 43  02/20/17: Sodium 138, potassium 4.3, BUN 12, creatinine 0.6 on AST 14, ALT 29 Hemoglobin A1c 5.6% Total cholesterol 120, triglycerides 63, HDL 47, LDL 60 TSH 1.60   Wt Readings from Last 3 Encounters:  01/25/18 185 lb (83.9 kg)  08/15/17 162 lb (73.5 kg)  02/26/17 175 lb (79.4 kg)    TTE 01/31/10: EF 55-60%.  No wall motion abnormalities.  Grade 1 diastolic dysfunction.  Calcification of anterior mitral chordae.  Mildly elevated PASP.  Lexiscan Myoview 08/15/16: The left ventricular ejection fraction is hyperdynamic (>65%).  Nuclear stress EF: 67%.  There was no ST segment deviation noted during stress.  Defect 1: There is a small defect of mild severity present in the basal inferior and mid inferior location.  Findings consistent with ischemia.  This is a low risk study.  Cardiac CT-A 08/29/16:  IMPRESSION: 1) Left dominant coronary arteries with non obstructive disease in proximal and mid LAD  2) Calcium score 56 which is 59th percentile for age and sex  3) Normal aortic root 31 mm   Other studies Reviewed: Additional studies/ records that were reviewed today include: medical record. Review of the above records demonstrates:  Please see elsewhere in the note.     ASSESSMENT AND PLAN:   # Hypertension: Her blood pressure has been high at home.  It was initially okay but on repeat it was elevated to the 277O systolic.  We will switch furosemide to hydrochlorthiazide 25 mg daily and stop furosemide.  Continue carvedilol.  She will have a basic metabolic panel checked in 1 week.  She will continue to track her blood pressures at home.  Her  home monitor correlated well with our office reading.  #  Chest pain: # Non-obstructive CAD: Resolved. Cardiac CT-A 08/2016 was very reassuring. She has non--obstructive coronary disease. Her symptoms are likely due to stress and anxiety.  Continue aspirin 81 mg daily and atorvastatin.  # Hyperlipidemia:  LDL 60 02/2017. She is now on atorvastatin.   # Takotsubo cardiomyopathy: Resolved.  LVEF 67% 08/2016.    Current medicines are reviewed at length with the patient today.  The patient does not have concerns regarding medicines.  The following changes have been made:  Switch lasix to HCTZ.  Labs/ tests ordered today include:    No orders of the defined types were placed in this encounter.    Disposition:   FU with Dr. Jonelle Sidle C. Tuttle in 1 month.  Signed, Kathy Latch, MD  01/25/2018 2:06 PM    [place]

## 2018-01-31 DIAGNOSIS — H40033 Anatomical narrow angle, bilateral: Secondary | ICD-10-CM | POA: Diagnosis not present

## 2018-01-31 DIAGNOSIS — H04123 Dry eye syndrome of bilateral lacrimal glands: Secondary | ICD-10-CM | POA: Diagnosis not present

## 2018-02-01 ENCOUNTER — Other Ambulatory Visit: Payer: Self-pay | Admitting: Gastroenterology

## 2018-02-01 DIAGNOSIS — R1031 Right lower quadrant pain: Secondary | ICD-10-CM

## 2018-02-01 DIAGNOSIS — Z7984 Long term (current) use of oral hypoglycemic drugs: Secondary | ICD-10-CM | POA: Diagnosis not present

## 2018-02-01 DIAGNOSIS — E119 Type 2 diabetes mellitus without complications: Secondary | ICD-10-CM | POA: Diagnosis not present

## 2018-02-04 ENCOUNTER — Encounter: Payer: Self-pay | Admitting: Cardiovascular Disease

## 2018-02-04 DIAGNOSIS — R1031 Right lower quadrant pain: Secondary | ICD-10-CM | POA: Diagnosis not present

## 2018-02-05 ENCOUNTER — Telehealth: Payer: Self-pay | Admitting: *Deleted

## 2018-02-05 NOTE — Telephone Encounter (Signed)
Patient had labs in [place], printed for Dr Oval Linsey to review. She was to have follow up labs 1 week after last ov

## 2018-02-06 DIAGNOSIS — E871 Hypo-osmolality and hyponatremia: Secondary | ICD-10-CM | POA: Diagnosis not present

## 2018-02-07 ENCOUNTER — Ambulatory Visit
Admission: RE | Admit: 2018-02-07 | Discharge: 2018-02-07 | Disposition: A | Discharge status: Home / Self Care | Payer: Medicare Other | Source: Ambulatory Visit | Attending: Gastroenterology | Admitting: Gastroenterology

## 2018-02-07 ENCOUNTER — Telehealth: Payer: Self-pay | Admitting: Cardiovascular Disease

## 2018-02-07 DIAGNOSIS — K5900 Constipation, unspecified: Secondary | ICD-10-CM | POA: Diagnosis not present

## 2018-02-07 DIAGNOSIS — R1031 Right lower quadrant pain: Secondary | ICD-10-CM

## 2018-02-07 MED ORDER — IOPAMIDOL (ISOVUE-300) INJECTION 61%
100.0000 mL | Freq: Once | INTRAVENOUS | Status: AC | PRN
  Administered 2018-02-07: 100 mL via INTRAVENOUS

## 2018-02-07 NOTE — Telephone Encounter (Signed)
Advised patient, verbalized understanding  

## 2018-02-07 NOTE — Telephone Encounter (Signed)
New Message:    Patient has questions why her PCP removed the following 3  from her list of medications to take.  hydrochlorothiazide (HYDRODIURIL) 25 MG tablet sertraline (ZOLOFT) 50 MG tablet  metFORMIN (GLUCOPHAGE) 500 MG tablet

## 2018-02-07 NOTE — Telephone Encounter (Signed)
Her heart squeezes normally now.  Holding her diuretic for a while will not cause her to have fluid around her heart.  Agree with monitoring for increased swelling.

## 2018-02-07 NOTE — Telephone Encounter (Signed)
Returned call to patient, patient states her PCP has told her to hold her HCTZ, Zoloft, and metformin x 1 week due to her low sodium.  She is concerned because she doesn't want "fluid around her heart".    She states she is having repeat lab work on 9/30 at PCP.  Advised to follow medication instructions per PCP and to have labs repeated as scheduled on 9/30.  Advised to monitor for swelling, SOB, patient unable to weigh as she is in a wheelchair.   Advised would make Dr. Oval Linsey aware and patient verbalized understanding.    No labs scanned in ([facility]?)

## 2018-02-11 DIAGNOSIS — E871 Hypo-osmolality and hyponatremia: Secondary | ICD-10-CM | POA: Diagnosis not present

## 2018-02-18 DIAGNOSIS — R935 Abnormal findings on diagnostic imaging of other abdominal regions, including retroperitoneum: Secondary | ICD-10-CM | POA: Diagnosis not present

## 2018-02-18 DIAGNOSIS — R1030 Lower abdominal pain, unspecified: Secondary | ICD-10-CM | POA: Diagnosis not present

## 2018-02-18 DIAGNOSIS — E119 Type 2 diabetes mellitus without complications: Secondary | ICD-10-CM | POA: Diagnosis not present

## 2018-02-21 DIAGNOSIS — R933 Abnormal findings on diagnostic imaging of other parts of digestive tract: Secondary | ICD-10-CM | POA: Diagnosis not present

## 2018-02-21 DIAGNOSIS — K2970 Gastritis, unspecified, without bleeding: Secondary | ICD-10-CM | POA: Diagnosis not present

## 2018-02-21 DIAGNOSIS — K449 Diaphragmatic hernia without obstruction or gangrene: Secondary | ICD-10-CM | POA: Diagnosis not present

## 2018-02-21 DIAGNOSIS — K2930 Chronic superficial gastritis without bleeding: Secondary | ICD-10-CM | POA: Diagnosis not present

## 2018-02-25 ENCOUNTER — Other Ambulatory Visit: Payer: Self-pay | Admitting: Cardiovascular Disease

## 2018-02-25 DIAGNOSIS — E871 Hypo-osmolality and hyponatremia: Secondary | ICD-10-CM | POA: Diagnosis not present

## 2018-02-25 DIAGNOSIS — R197 Diarrhea, unspecified: Secondary | ICD-10-CM | POA: Diagnosis not present

## 2018-02-25 DIAGNOSIS — R609 Edema, unspecified: Secondary | ICD-10-CM | POA: Diagnosis not present

## 2018-02-25 DIAGNOSIS — N390 Urinary tract infection, site not specified: Secondary | ICD-10-CM | POA: Diagnosis not present

## 2018-02-25 DIAGNOSIS — R110 Nausea: Secondary | ICD-10-CM | POA: Diagnosis not present

## 2018-02-25 NOTE — Telephone Encounter (Signed)
Rx has been sent to the pharmacy electronically. ° °

## 2018-02-27 DIAGNOSIS — K2930 Chronic superficial gastritis without bleeding: Secondary | ICD-10-CM | POA: Diagnosis not present

## 2018-03-01 DIAGNOSIS — E119 Type 2 diabetes mellitus without complications: Secondary | ICD-10-CM | POA: Diagnosis not present

## 2018-03-01 DIAGNOSIS — K219 Gastro-esophageal reflux disease without esophagitis: Secondary | ICD-10-CM | POA: Diagnosis not present

## 2018-03-01 DIAGNOSIS — N319 Neuromuscular dysfunction of bladder, unspecified: Secondary | ICD-10-CM | POA: Diagnosis not present

## 2018-03-01 DIAGNOSIS — E039 Hypothyroidism, unspecified: Secondary | ICD-10-CM | POA: Diagnosis not present

## 2018-03-01 DIAGNOSIS — G4733 Obstructive sleep apnea (adult) (pediatric): Secondary | ICD-10-CM | POA: Diagnosis not present

## 2018-03-01 DIAGNOSIS — E7800 Pure hypercholesterolemia, unspecified: Secondary | ICD-10-CM | POA: Diagnosis not present

## 2018-03-01 DIAGNOSIS — I2510 Atherosclerotic heart disease of native coronary artery without angina pectoris: Secondary | ICD-10-CM | POA: Diagnosis not present

## 2018-03-01 DIAGNOSIS — I252 Old myocardial infarction: Secondary | ICD-10-CM | POA: Diagnosis not present

## 2018-03-01 DIAGNOSIS — J45909 Unspecified asthma, uncomplicated: Secondary | ICD-10-CM | POA: Diagnosis not present

## 2018-03-01 DIAGNOSIS — Z87440 Personal history of urinary (tract) infections: Secondary | ICD-10-CM | POA: Diagnosis not present

## 2018-03-01 DIAGNOSIS — I509 Heart failure, unspecified: Secondary | ICD-10-CM | POA: Diagnosis not present

## 2018-03-01 DIAGNOSIS — M519 Unspecified thoracic, thoracolumbar and lumbosacral intervertebral disc disorder: Secondary | ICD-10-CM | POA: Diagnosis not present

## 2018-03-01 DIAGNOSIS — I110 Hypertensive heart disease with heart failure: Secondary | ICD-10-CM | POA: Diagnosis not present

## 2018-03-01 DIAGNOSIS — Z7982 Long term (current) use of aspirin: Secondary | ICD-10-CM | POA: Diagnosis not present

## 2018-03-01 DIAGNOSIS — I4891 Unspecified atrial fibrillation: Secondary | ICD-10-CM | POA: Diagnosis not present

## 2018-03-01 DIAGNOSIS — M810 Age-related osteoporosis without current pathological fracture: Secondary | ICD-10-CM | POA: Diagnosis not present

## 2018-03-01 DIAGNOSIS — Z9181 History of falling: Secondary | ICD-10-CM | POA: Diagnosis not present

## 2018-03-06 ENCOUNTER — Ambulatory Visit: Payer: Medicare Other | Admitting: Cardiovascular Disease

## 2018-03-07 ENCOUNTER — Ambulatory Visit: Payer: Medicare Other | Admitting: Cardiovascular Disease

## 2018-03-07 ENCOUNTER — Encounter

## 2018-03-07 DIAGNOSIS — M6281 Muscle weakness (generalized): Secondary | ICD-10-CM | POA: Diagnosis not present

## 2018-03-12 DIAGNOSIS — E039 Hypothyroidism, unspecified: Secondary | ICD-10-CM | POA: Diagnosis not present

## 2018-03-12 DIAGNOSIS — M810 Age-related osteoporosis without current pathological fracture: Secondary | ICD-10-CM | POA: Diagnosis not present

## 2018-03-12 DIAGNOSIS — Z7982 Long term (current) use of aspirin: Secondary | ICD-10-CM | POA: Diagnosis not present

## 2018-03-12 DIAGNOSIS — N319 Neuromuscular dysfunction of bladder, unspecified: Secondary | ICD-10-CM | POA: Diagnosis not present

## 2018-03-12 DIAGNOSIS — K219 Gastro-esophageal reflux disease without esophagitis: Secondary | ICD-10-CM | POA: Diagnosis not present

## 2018-03-12 DIAGNOSIS — I4891 Unspecified atrial fibrillation: Secondary | ICD-10-CM | POA: Diagnosis not present

## 2018-03-12 DIAGNOSIS — I509 Heart failure, unspecified: Secondary | ICD-10-CM | POA: Diagnosis not present

## 2018-03-12 DIAGNOSIS — I110 Hypertensive heart disease with heart failure: Secondary | ICD-10-CM | POA: Diagnosis not present

## 2018-03-12 DIAGNOSIS — Z87440 Personal history of urinary (tract) infections: Secondary | ICD-10-CM | POA: Diagnosis not present

## 2018-03-12 DIAGNOSIS — J45909 Unspecified asthma, uncomplicated: Secondary | ICD-10-CM | POA: Diagnosis not present

## 2018-03-12 DIAGNOSIS — I252 Old myocardial infarction: Secondary | ICD-10-CM | POA: Diagnosis not present

## 2018-03-12 DIAGNOSIS — I2510 Atherosclerotic heart disease of native coronary artery without angina pectoris: Secondary | ICD-10-CM | POA: Diagnosis not present

## 2018-03-12 DIAGNOSIS — M519 Unspecified thoracic, thoracolumbar and lumbosacral intervertebral disc disorder: Secondary | ICD-10-CM | POA: Diagnosis not present

## 2018-03-12 DIAGNOSIS — G4733 Obstructive sleep apnea (adult) (pediatric): Secondary | ICD-10-CM | POA: Diagnosis not present

## 2018-03-12 DIAGNOSIS — E7800 Pure hypercholesterolemia, unspecified: Secondary | ICD-10-CM | POA: Diagnosis not present

## 2018-03-12 DIAGNOSIS — Z9181 History of falling: Secondary | ICD-10-CM | POA: Diagnosis not present

## 2018-03-12 DIAGNOSIS — E119 Type 2 diabetes mellitus without complications: Secondary | ICD-10-CM | POA: Diagnosis not present

## 2018-03-15 DIAGNOSIS — E871 Hypo-osmolality and hyponatremia: Secondary | ICD-10-CM | POA: Diagnosis not present

## 2018-03-15 DIAGNOSIS — N3000 Acute cystitis without hematuria: Secondary | ICD-10-CM | POA: Diagnosis not present

## 2018-03-15 DIAGNOSIS — K219 Gastro-esophageal reflux disease without esophagitis: Secondary | ICD-10-CM | POA: Diagnosis not present

## 2018-03-15 DIAGNOSIS — R82998 Other abnormal findings in urine: Secondary | ICD-10-CM | POA: Diagnosis not present

## 2018-03-15 DIAGNOSIS — R933 Abnormal findings on diagnostic imaging of other parts of digestive tract: Secondary | ICD-10-CM | POA: Diagnosis not present

## 2018-03-15 DIAGNOSIS — R9341 Abnormal radiologic findings on diagnostic imaging of renal pelvis, ureter, or bladder: Secondary | ICD-10-CM | POA: Diagnosis not present

## 2018-03-15 DIAGNOSIS — R1030 Lower abdominal pain, unspecified: Secondary | ICD-10-CM | POA: Diagnosis not present

## 2018-03-22 DIAGNOSIS — J45909 Unspecified asthma, uncomplicated: Secondary | ICD-10-CM | POA: Diagnosis not present

## 2018-03-22 DIAGNOSIS — Z7982 Long term (current) use of aspirin: Secondary | ICD-10-CM | POA: Diagnosis not present

## 2018-03-22 DIAGNOSIS — E7800 Pure hypercholesterolemia, unspecified: Secondary | ICD-10-CM | POA: Diagnosis not present

## 2018-03-22 DIAGNOSIS — I4891 Unspecified atrial fibrillation: Secondary | ICD-10-CM | POA: Diagnosis not present

## 2018-03-22 DIAGNOSIS — M810 Age-related osteoporosis without current pathological fracture: Secondary | ICD-10-CM | POA: Diagnosis not present

## 2018-03-22 DIAGNOSIS — Z9181 History of falling: Secondary | ICD-10-CM | POA: Diagnosis not present

## 2018-03-22 DIAGNOSIS — I2510 Atherosclerotic heart disease of native coronary artery without angina pectoris: Secondary | ICD-10-CM | POA: Diagnosis not present

## 2018-03-22 DIAGNOSIS — G4733 Obstructive sleep apnea (adult) (pediatric): Secondary | ICD-10-CM | POA: Diagnosis not present

## 2018-03-22 DIAGNOSIS — M519 Unspecified thoracic, thoracolumbar and lumbosacral intervertebral disc disorder: Secondary | ICD-10-CM | POA: Diagnosis not present

## 2018-03-22 DIAGNOSIS — I252 Old myocardial infarction: Secondary | ICD-10-CM | POA: Diagnosis not present

## 2018-03-22 DIAGNOSIS — E039 Hypothyroidism, unspecified: Secondary | ICD-10-CM | POA: Diagnosis not present

## 2018-03-22 DIAGNOSIS — I509 Heart failure, unspecified: Secondary | ICD-10-CM | POA: Diagnosis not present

## 2018-03-22 DIAGNOSIS — K219 Gastro-esophageal reflux disease without esophagitis: Secondary | ICD-10-CM | POA: Diagnosis not present

## 2018-03-22 DIAGNOSIS — E119 Type 2 diabetes mellitus without complications: Secondary | ICD-10-CM | POA: Diagnosis not present

## 2018-03-22 DIAGNOSIS — Z87440 Personal history of urinary (tract) infections: Secondary | ICD-10-CM | POA: Diagnosis not present

## 2018-03-22 DIAGNOSIS — N319 Neuromuscular dysfunction of bladder, unspecified: Secondary | ICD-10-CM | POA: Diagnosis not present

## 2018-03-22 DIAGNOSIS — I110 Hypertensive heart disease with heart failure: Secondary | ICD-10-CM | POA: Diagnosis not present

## 2018-03-25 DIAGNOSIS — I110 Hypertensive heart disease with heart failure: Secondary | ICD-10-CM | POA: Diagnosis not present

## 2018-03-25 DIAGNOSIS — E119 Type 2 diabetes mellitus without complications: Secondary | ICD-10-CM | POA: Diagnosis not present

## 2018-03-25 DIAGNOSIS — Z7982 Long term (current) use of aspirin: Secondary | ICD-10-CM | POA: Diagnosis not present

## 2018-03-25 DIAGNOSIS — I509 Heart failure, unspecified: Secondary | ICD-10-CM | POA: Diagnosis not present

## 2018-03-25 DIAGNOSIS — K219 Gastro-esophageal reflux disease without esophagitis: Secondary | ICD-10-CM | POA: Diagnosis not present

## 2018-03-25 DIAGNOSIS — Z87440 Personal history of urinary (tract) infections: Secondary | ICD-10-CM | POA: Diagnosis not present

## 2018-03-25 DIAGNOSIS — I2510 Atherosclerotic heart disease of native coronary artery without angina pectoris: Secondary | ICD-10-CM | POA: Diagnosis not present

## 2018-03-25 DIAGNOSIS — G4733 Obstructive sleep apnea (adult) (pediatric): Secondary | ICD-10-CM | POA: Diagnosis not present

## 2018-03-25 DIAGNOSIS — M519 Unspecified thoracic, thoracolumbar and lumbosacral intervertebral disc disorder: Secondary | ICD-10-CM | POA: Diagnosis not present

## 2018-03-25 DIAGNOSIS — M810 Age-related osteoporosis without current pathological fracture: Secondary | ICD-10-CM | POA: Diagnosis not present

## 2018-03-25 DIAGNOSIS — E7800 Pure hypercholesterolemia, unspecified: Secondary | ICD-10-CM | POA: Diagnosis not present

## 2018-03-25 DIAGNOSIS — E039 Hypothyroidism, unspecified: Secondary | ICD-10-CM | POA: Diagnosis not present

## 2018-03-25 DIAGNOSIS — N319 Neuromuscular dysfunction of bladder, unspecified: Secondary | ICD-10-CM | POA: Diagnosis not present

## 2018-03-25 DIAGNOSIS — I252 Old myocardial infarction: Secondary | ICD-10-CM | POA: Diagnosis not present

## 2018-03-25 DIAGNOSIS — J45909 Unspecified asthma, uncomplicated: Secondary | ICD-10-CM | POA: Diagnosis not present

## 2018-03-25 DIAGNOSIS — I4891 Unspecified atrial fibrillation: Secondary | ICD-10-CM | POA: Diagnosis not present

## 2018-03-25 DIAGNOSIS — Z9181 History of falling: Secondary | ICD-10-CM | POA: Diagnosis not present

## 2018-03-27 DIAGNOSIS — I110 Hypertensive heart disease with heart failure: Secondary | ICD-10-CM | POA: Diagnosis not present

## 2018-03-27 DIAGNOSIS — J45909 Unspecified asthma, uncomplicated: Secondary | ICD-10-CM | POA: Diagnosis not present

## 2018-03-27 DIAGNOSIS — E119 Type 2 diabetes mellitus without complications: Secondary | ICD-10-CM | POA: Diagnosis not present

## 2018-03-27 DIAGNOSIS — K219 Gastro-esophageal reflux disease without esophagitis: Secondary | ICD-10-CM | POA: Diagnosis not present

## 2018-03-27 DIAGNOSIS — M810 Age-related osteoporosis without current pathological fracture: Secondary | ICD-10-CM | POA: Diagnosis not present

## 2018-03-27 DIAGNOSIS — E7800 Pure hypercholesterolemia, unspecified: Secondary | ICD-10-CM | POA: Diagnosis not present

## 2018-03-27 DIAGNOSIS — I4891 Unspecified atrial fibrillation: Secondary | ICD-10-CM | POA: Diagnosis not present

## 2018-03-27 DIAGNOSIS — N319 Neuromuscular dysfunction of bladder, unspecified: Secondary | ICD-10-CM | POA: Diagnosis not present

## 2018-03-27 DIAGNOSIS — I252 Old myocardial infarction: Secondary | ICD-10-CM | POA: Diagnosis not present

## 2018-03-27 DIAGNOSIS — Z7982 Long term (current) use of aspirin: Secondary | ICD-10-CM | POA: Diagnosis not present

## 2018-03-27 DIAGNOSIS — Z9181 History of falling: Secondary | ICD-10-CM | POA: Diagnosis not present

## 2018-03-27 DIAGNOSIS — E039 Hypothyroidism, unspecified: Secondary | ICD-10-CM | POA: Diagnosis not present

## 2018-03-27 DIAGNOSIS — I2510 Atherosclerotic heart disease of native coronary artery without angina pectoris: Secondary | ICD-10-CM | POA: Diagnosis not present

## 2018-03-27 DIAGNOSIS — M519 Unspecified thoracic, thoracolumbar and lumbosacral intervertebral disc disorder: Secondary | ICD-10-CM | POA: Diagnosis not present

## 2018-03-27 DIAGNOSIS — G4733 Obstructive sleep apnea (adult) (pediatric): Secondary | ICD-10-CM | POA: Diagnosis not present

## 2018-03-27 DIAGNOSIS — Z87440 Personal history of urinary (tract) infections: Secondary | ICD-10-CM | POA: Diagnosis not present

## 2018-03-27 DIAGNOSIS — I509 Heart failure, unspecified: Secondary | ICD-10-CM | POA: Diagnosis not present

## 2018-03-28 DIAGNOSIS — E7800 Pure hypercholesterolemia, unspecified: Secondary | ICD-10-CM | POA: Diagnosis not present

## 2018-03-28 DIAGNOSIS — I10 Essential (primary) hypertension: Secondary | ICD-10-CM | POA: Diagnosis not present

## 2018-03-28 DIAGNOSIS — E039 Hypothyroidism, unspecified: Secondary | ICD-10-CM | POA: Diagnosis not present

## 2018-03-28 DIAGNOSIS — J45909 Unspecified asthma, uncomplicated: Secondary | ICD-10-CM | POA: Diagnosis not present

## 2018-03-28 DIAGNOSIS — N319 Neuromuscular dysfunction of bladder, unspecified: Secondary | ICD-10-CM | POA: Diagnosis not present

## 2018-03-28 DIAGNOSIS — E119 Type 2 diabetes mellitus without complications: Secondary | ICD-10-CM | POA: Diagnosis not present

## 2018-03-28 DIAGNOSIS — Z7982 Long term (current) use of aspirin: Secondary | ICD-10-CM | POA: Diagnosis not present

## 2018-03-28 DIAGNOSIS — I2510 Atherosclerotic heart disease of native coronary artery without angina pectoris: Secondary | ICD-10-CM | POA: Diagnosis not present

## 2018-03-28 DIAGNOSIS — I4891 Unspecified atrial fibrillation: Secondary | ICD-10-CM | POA: Diagnosis not present

## 2018-03-28 DIAGNOSIS — I252 Old myocardial infarction: Secondary | ICD-10-CM | POA: Diagnosis not present

## 2018-03-28 DIAGNOSIS — G4733 Obstructive sleep apnea (adult) (pediatric): Secondary | ICD-10-CM | POA: Diagnosis not present

## 2018-03-28 DIAGNOSIS — I509 Heart failure, unspecified: Secondary | ICD-10-CM | POA: Diagnosis not present

## 2018-03-28 DIAGNOSIS — I110 Hypertensive heart disease with heart failure: Secondary | ICD-10-CM | POA: Diagnosis not present

## 2018-03-28 DIAGNOSIS — K219 Gastro-esophageal reflux disease without esophagitis: Secondary | ICD-10-CM | POA: Diagnosis not present

## 2018-03-28 DIAGNOSIS — M519 Unspecified thoracic, thoracolumbar and lumbosacral intervertebral disc disorder: Secondary | ICD-10-CM | POA: Diagnosis not present

## 2018-03-28 DIAGNOSIS — M810 Age-related osteoporosis without current pathological fracture: Secondary | ICD-10-CM | POA: Diagnosis not present

## 2018-03-28 DIAGNOSIS — Z87440 Personal history of urinary (tract) infections: Secondary | ICD-10-CM | POA: Diagnosis not present

## 2018-03-28 DIAGNOSIS — Z9181 History of falling: Secondary | ICD-10-CM | POA: Diagnosis not present

## 2018-03-29 DIAGNOSIS — M810 Age-related osteoporosis without current pathological fracture: Secondary | ICD-10-CM | POA: Diagnosis not present

## 2018-03-29 DIAGNOSIS — Z7982 Long term (current) use of aspirin: Secondary | ICD-10-CM | POA: Diagnosis not present

## 2018-03-29 DIAGNOSIS — I2510 Atherosclerotic heart disease of native coronary artery without angina pectoris: Secondary | ICD-10-CM | POA: Diagnosis not present

## 2018-03-29 DIAGNOSIS — E039 Hypothyroidism, unspecified: Secondary | ICD-10-CM | POA: Diagnosis not present

## 2018-03-29 DIAGNOSIS — M519 Unspecified thoracic, thoracolumbar and lumbosacral intervertebral disc disorder: Secondary | ICD-10-CM | POA: Diagnosis not present

## 2018-03-29 DIAGNOSIS — I110 Hypertensive heart disease with heart failure: Secondary | ICD-10-CM | POA: Diagnosis not present

## 2018-03-29 DIAGNOSIS — Z87440 Personal history of urinary (tract) infections: Secondary | ICD-10-CM | POA: Diagnosis not present

## 2018-03-29 DIAGNOSIS — I4891 Unspecified atrial fibrillation: Secondary | ICD-10-CM | POA: Diagnosis not present

## 2018-03-29 DIAGNOSIS — E7800 Pure hypercholesterolemia, unspecified: Secondary | ICD-10-CM | POA: Diagnosis not present

## 2018-03-29 DIAGNOSIS — K219 Gastro-esophageal reflux disease without esophagitis: Secondary | ICD-10-CM | POA: Diagnosis not present

## 2018-03-29 DIAGNOSIS — N319 Neuromuscular dysfunction of bladder, unspecified: Secondary | ICD-10-CM | POA: Diagnosis not present

## 2018-03-29 DIAGNOSIS — I509 Heart failure, unspecified: Secondary | ICD-10-CM | POA: Diagnosis not present

## 2018-03-29 DIAGNOSIS — G4733 Obstructive sleep apnea (adult) (pediatric): Secondary | ICD-10-CM | POA: Diagnosis not present

## 2018-03-29 DIAGNOSIS — Z9181 History of falling: Secondary | ICD-10-CM | POA: Diagnosis not present

## 2018-03-29 DIAGNOSIS — I252 Old myocardial infarction: Secondary | ICD-10-CM | POA: Diagnosis not present

## 2018-03-29 DIAGNOSIS — J45909 Unspecified asthma, uncomplicated: Secondary | ICD-10-CM | POA: Diagnosis not present

## 2018-03-29 DIAGNOSIS — E119 Type 2 diabetes mellitus without complications: Secondary | ICD-10-CM | POA: Diagnosis not present

## 2018-04-01 DIAGNOSIS — E119 Type 2 diabetes mellitus without complications: Secondary | ICD-10-CM | POA: Diagnosis not present

## 2018-04-01 DIAGNOSIS — Z7982 Long term (current) use of aspirin: Secondary | ICD-10-CM | POA: Diagnosis not present

## 2018-04-01 DIAGNOSIS — M519 Unspecified thoracic, thoracolumbar and lumbosacral intervertebral disc disorder: Secondary | ICD-10-CM | POA: Diagnosis not present

## 2018-04-01 DIAGNOSIS — J45909 Unspecified asthma, uncomplicated: Secondary | ICD-10-CM | POA: Diagnosis not present

## 2018-04-01 DIAGNOSIS — E7800 Pure hypercholesterolemia, unspecified: Secondary | ICD-10-CM | POA: Diagnosis not present

## 2018-04-01 DIAGNOSIS — I509 Heart failure, unspecified: Secondary | ICD-10-CM | POA: Diagnosis not present

## 2018-04-01 DIAGNOSIS — I252 Old myocardial infarction: Secondary | ICD-10-CM | POA: Diagnosis not present

## 2018-04-01 DIAGNOSIS — I4891 Unspecified atrial fibrillation: Secondary | ICD-10-CM | POA: Diagnosis not present

## 2018-04-01 DIAGNOSIS — E039 Hypothyroidism, unspecified: Secondary | ICD-10-CM | POA: Diagnosis not present

## 2018-04-01 DIAGNOSIS — Z9181 History of falling: Secondary | ICD-10-CM | POA: Diagnosis not present

## 2018-04-01 DIAGNOSIS — G4733 Obstructive sleep apnea (adult) (pediatric): Secondary | ICD-10-CM | POA: Diagnosis not present

## 2018-04-01 DIAGNOSIS — K219 Gastro-esophageal reflux disease without esophagitis: Secondary | ICD-10-CM | POA: Diagnosis not present

## 2018-04-01 DIAGNOSIS — I110 Hypertensive heart disease with heart failure: Secondary | ICD-10-CM | POA: Diagnosis not present

## 2018-04-01 DIAGNOSIS — M810 Age-related osteoporosis without current pathological fracture: Secondary | ICD-10-CM | POA: Diagnosis not present

## 2018-04-01 DIAGNOSIS — I2510 Atherosclerotic heart disease of native coronary artery without angina pectoris: Secondary | ICD-10-CM | POA: Diagnosis not present

## 2018-04-01 DIAGNOSIS — Z87440 Personal history of urinary (tract) infections: Secondary | ICD-10-CM | POA: Diagnosis not present

## 2018-04-01 DIAGNOSIS — N319 Neuromuscular dysfunction of bladder, unspecified: Secondary | ICD-10-CM | POA: Diagnosis not present

## 2018-04-03 DIAGNOSIS — I4891 Unspecified atrial fibrillation: Secondary | ICD-10-CM | POA: Diagnosis not present

## 2018-04-03 DIAGNOSIS — M519 Unspecified thoracic, thoracolumbar and lumbosacral intervertebral disc disorder: Secondary | ICD-10-CM | POA: Diagnosis not present

## 2018-04-03 DIAGNOSIS — Z9181 History of falling: Secondary | ICD-10-CM | POA: Diagnosis not present

## 2018-04-03 DIAGNOSIS — I252 Old myocardial infarction: Secondary | ICD-10-CM | POA: Diagnosis not present

## 2018-04-03 DIAGNOSIS — G4733 Obstructive sleep apnea (adult) (pediatric): Secondary | ICD-10-CM | POA: Diagnosis not present

## 2018-04-03 DIAGNOSIS — K219 Gastro-esophageal reflux disease without esophagitis: Secondary | ICD-10-CM | POA: Diagnosis not present

## 2018-04-03 DIAGNOSIS — I509 Heart failure, unspecified: Secondary | ICD-10-CM | POA: Diagnosis not present

## 2018-04-03 DIAGNOSIS — I2510 Atherosclerotic heart disease of native coronary artery without angina pectoris: Secondary | ICD-10-CM | POA: Diagnosis not present

## 2018-04-03 DIAGNOSIS — E119 Type 2 diabetes mellitus without complications: Secondary | ICD-10-CM | POA: Diagnosis not present

## 2018-04-03 DIAGNOSIS — M810 Age-related osteoporosis without current pathological fracture: Secondary | ICD-10-CM | POA: Diagnosis not present

## 2018-04-03 DIAGNOSIS — N319 Neuromuscular dysfunction of bladder, unspecified: Secondary | ICD-10-CM | POA: Diagnosis not present

## 2018-04-03 DIAGNOSIS — E039 Hypothyroidism, unspecified: Secondary | ICD-10-CM | POA: Diagnosis not present

## 2018-04-03 DIAGNOSIS — J45909 Unspecified asthma, uncomplicated: Secondary | ICD-10-CM | POA: Diagnosis not present

## 2018-04-03 DIAGNOSIS — Z87440 Personal history of urinary (tract) infections: Secondary | ICD-10-CM | POA: Diagnosis not present

## 2018-04-03 DIAGNOSIS — I110 Hypertensive heart disease with heart failure: Secondary | ICD-10-CM | POA: Diagnosis not present

## 2018-04-03 DIAGNOSIS — E7800 Pure hypercholesterolemia, unspecified: Secondary | ICD-10-CM | POA: Diagnosis not present

## 2018-04-03 DIAGNOSIS — Z7982 Long term (current) use of aspirin: Secondary | ICD-10-CM | POA: Diagnosis not present

## 2018-04-04 DIAGNOSIS — I10 Essential (primary) hypertension: Secondary | ICD-10-CM | POA: Diagnosis not present

## 2018-04-05 DIAGNOSIS — N319 Neuromuscular dysfunction of bladder, unspecified: Secondary | ICD-10-CM | POA: Diagnosis not present

## 2018-04-05 DIAGNOSIS — N3020 Other chronic cystitis without hematuria: Secondary | ICD-10-CM | POA: Diagnosis not present

## 2018-04-05 DIAGNOSIS — G8220 Paraplegia, unspecified: Secondary | ICD-10-CM | POA: Diagnosis not present

## 2018-04-05 DIAGNOSIS — N261 Atrophy of kidney (terminal): Secondary | ICD-10-CM | POA: Diagnosis not present

## 2018-04-08 DIAGNOSIS — Z9181 History of falling: Secondary | ICD-10-CM | POA: Diagnosis not present

## 2018-04-08 DIAGNOSIS — I2510 Atherosclerotic heart disease of native coronary artery without angina pectoris: Secondary | ICD-10-CM | POA: Diagnosis not present

## 2018-04-08 DIAGNOSIS — N319 Neuromuscular dysfunction of bladder, unspecified: Secondary | ICD-10-CM | POA: Diagnosis not present

## 2018-04-08 DIAGNOSIS — E119 Type 2 diabetes mellitus without complications: Secondary | ICD-10-CM | POA: Diagnosis not present

## 2018-04-08 DIAGNOSIS — E7800 Pure hypercholesterolemia, unspecified: Secondary | ICD-10-CM | POA: Diagnosis not present

## 2018-04-08 DIAGNOSIS — Z87440 Personal history of urinary (tract) infections: Secondary | ICD-10-CM | POA: Diagnosis not present

## 2018-04-08 DIAGNOSIS — G4733 Obstructive sleep apnea (adult) (pediatric): Secondary | ICD-10-CM | POA: Diagnosis not present

## 2018-04-08 DIAGNOSIS — I110 Hypertensive heart disease with heart failure: Secondary | ICD-10-CM | POA: Diagnosis not present

## 2018-04-08 DIAGNOSIS — J45909 Unspecified asthma, uncomplicated: Secondary | ICD-10-CM | POA: Diagnosis not present

## 2018-04-08 DIAGNOSIS — M810 Age-related osteoporosis without current pathological fracture: Secondary | ICD-10-CM | POA: Diagnosis not present

## 2018-04-08 DIAGNOSIS — Z7982 Long term (current) use of aspirin: Secondary | ICD-10-CM | POA: Diagnosis not present

## 2018-04-08 DIAGNOSIS — I4891 Unspecified atrial fibrillation: Secondary | ICD-10-CM | POA: Diagnosis not present

## 2018-04-08 DIAGNOSIS — E039 Hypothyroidism, unspecified: Secondary | ICD-10-CM | POA: Diagnosis not present

## 2018-04-08 DIAGNOSIS — I252 Old myocardial infarction: Secondary | ICD-10-CM | POA: Diagnosis not present

## 2018-04-08 DIAGNOSIS — M519 Unspecified thoracic, thoracolumbar and lumbosacral intervertebral disc disorder: Secondary | ICD-10-CM | POA: Diagnosis not present

## 2018-04-08 DIAGNOSIS — K219 Gastro-esophageal reflux disease without esophagitis: Secondary | ICD-10-CM | POA: Diagnosis not present

## 2018-04-08 DIAGNOSIS — I509 Heart failure, unspecified: Secondary | ICD-10-CM | POA: Diagnosis not present

## 2018-04-09 DIAGNOSIS — M6281 Muscle weakness (generalized): Secondary | ICD-10-CM | POA: Diagnosis not present

## 2018-04-10 DIAGNOSIS — M810 Age-related osteoporosis without current pathological fracture: Secondary | ICD-10-CM | POA: Diagnosis not present

## 2018-04-10 DIAGNOSIS — Z7982 Long term (current) use of aspirin: Secondary | ICD-10-CM | POA: Diagnosis not present

## 2018-04-10 DIAGNOSIS — I509 Heart failure, unspecified: Secondary | ICD-10-CM | POA: Diagnosis not present

## 2018-04-10 DIAGNOSIS — E039 Hypothyroidism, unspecified: Secondary | ICD-10-CM | POA: Diagnosis not present

## 2018-04-10 DIAGNOSIS — G4733 Obstructive sleep apnea (adult) (pediatric): Secondary | ICD-10-CM | POA: Diagnosis not present

## 2018-04-10 DIAGNOSIS — I4891 Unspecified atrial fibrillation: Secondary | ICD-10-CM | POA: Diagnosis not present

## 2018-04-10 DIAGNOSIS — E119 Type 2 diabetes mellitus without complications: Secondary | ICD-10-CM | POA: Diagnosis not present

## 2018-04-10 DIAGNOSIS — K219 Gastro-esophageal reflux disease without esophagitis: Secondary | ICD-10-CM | POA: Diagnosis not present

## 2018-04-10 DIAGNOSIS — I2510 Atherosclerotic heart disease of native coronary artery without angina pectoris: Secondary | ICD-10-CM | POA: Diagnosis not present

## 2018-04-10 DIAGNOSIS — Z9181 History of falling: Secondary | ICD-10-CM | POA: Diagnosis not present

## 2018-04-10 DIAGNOSIS — I252 Old myocardial infarction: Secondary | ICD-10-CM | POA: Diagnosis not present

## 2018-04-10 DIAGNOSIS — N319 Neuromuscular dysfunction of bladder, unspecified: Secondary | ICD-10-CM | POA: Diagnosis not present

## 2018-04-10 DIAGNOSIS — J45909 Unspecified asthma, uncomplicated: Secondary | ICD-10-CM | POA: Diagnosis not present

## 2018-04-10 DIAGNOSIS — I110 Hypertensive heart disease with heart failure: Secondary | ICD-10-CM | POA: Diagnosis not present

## 2018-04-10 DIAGNOSIS — Z87440 Personal history of urinary (tract) infections: Secondary | ICD-10-CM | POA: Diagnosis not present

## 2018-04-10 DIAGNOSIS — E7800 Pure hypercholesterolemia, unspecified: Secondary | ICD-10-CM | POA: Diagnosis not present

## 2018-04-10 DIAGNOSIS — M519 Unspecified thoracic, thoracolumbar and lumbosacral intervertebral disc disorder: Secondary | ICD-10-CM | POA: Diagnosis not present

## 2018-04-17 ENCOUNTER — Ambulatory Visit: Payer: Medicare Other | Admitting: Cardiovascular Disease

## 2018-04-17 DIAGNOSIS — M519 Unspecified thoracic, thoracolumbar and lumbosacral intervertebral disc disorder: Secondary | ICD-10-CM | POA: Diagnosis not present

## 2018-04-17 DIAGNOSIS — E119 Type 2 diabetes mellitus without complications: Secondary | ICD-10-CM | POA: Diagnosis not present

## 2018-04-17 DIAGNOSIS — Z87440 Personal history of urinary (tract) infections: Secondary | ICD-10-CM | POA: Diagnosis not present

## 2018-04-17 DIAGNOSIS — G4733 Obstructive sleep apnea (adult) (pediatric): Secondary | ICD-10-CM | POA: Diagnosis not present

## 2018-04-17 DIAGNOSIS — I110 Hypertensive heart disease with heart failure: Secondary | ICD-10-CM | POA: Diagnosis not present

## 2018-04-17 DIAGNOSIS — J45909 Unspecified asthma, uncomplicated: Secondary | ICD-10-CM | POA: Diagnosis not present

## 2018-04-17 DIAGNOSIS — Z7982 Long term (current) use of aspirin: Secondary | ICD-10-CM | POA: Diagnosis not present

## 2018-04-17 DIAGNOSIS — I2510 Atherosclerotic heart disease of native coronary artery without angina pectoris: Secondary | ICD-10-CM | POA: Diagnosis not present

## 2018-04-17 DIAGNOSIS — E039 Hypothyroidism, unspecified: Secondary | ICD-10-CM | POA: Diagnosis not present

## 2018-04-17 DIAGNOSIS — Z9181 History of falling: Secondary | ICD-10-CM | POA: Diagnosis not present

## 2018-04-17 DIAGNOSIS — I509 Heart failure, unspecified: Secondary | ICD-10-CM | POA: Diagnosis not present

## 2018-04-17 DIAGNOSIS — M810 Age-related osteoporosis without current pathological fracture: Secondary | ICD-10-CM | POA: Diagnosis not present

## 2018-04-17 DIAGNOSIS — N319 Neuromuscular dysfunction of bladder, unspecified: Secondary | ICD-10-CM | POA: Diagnosis not present

## 2018-04-17 DIAGNOSIS — I4891 Unspecified atrial fibrillation: Secondary | ICD-10-CM | POA: Diagnosis not present

## 2018-04-17 DIAGNOSIS — E7800 Pure hypercholesterolemia, unspecified: Secondary | ICD-10-CM | POA: Diagnosis not present

## 2018-04-17 DIAGNOSIS — K219 Gastro-esophageal reflux disease without esophagitis: Secondary | ICD-10-CM | POA: Diagnosis not present

## 2018-04-17 DIAGNOSIS — I252 Old myocardial infarction: Secondary | ICD-10-CM | POA: Diagnosis not present

## 2018-04-18 DIAGNOSIS — J45909 Unspecified asthma, uncomplicated: Secondary | ICD-10-CM | POA: Diagnosis not present

## 2018-04-18 DIAGNOSIS — I2510 Atherosclerotic heart disease of native coronary artery without angina pectoris: Secondary | ICD-10-CM | POA: Diagnosis not present

## 2018-04-18 DIAGNOSIS — I4891 Unspecified atrial fibrillation: Secondary | ICD-10-CM | POA: Diagnosis not present

## 2018-04-18 DIAGNOSIS — I509 Heart failure, unspecified: Secondary | ICD-10-CM | POA: Diagnosis not present

## 2018-04-18 DIAGNOSIS — K219 Gastro-esophageal reflux disease without esophagitis: Secondary | ICD-10-CM | POA: Diagnosis not present

## 2018-04-18 DIAGNOSIS — I252 Old myocardial infarction: Secondary | ICD-10-CM | POA: Diagnosis not present

## 2018-04-18 DIAGNOSIS — E119 Type 2 diabetes mellitus without complications: Secondary | ICD-10-CM | POA: Diagnosis not present

## 2018-04-18 DIAGNOSIS — Z7982 Long term (current) use of aspirin: Secondary | ICD-10-CM | POA: Diagnosis not present

## 2018-04-18 DIAGNOSIS — G4733 Obstructive sleep apnea (adult) (pediatric): Secondary | ICD-10-CM | POA: Diagnosis not present

## 2018-04-18 DIAGNOSIS — Z9181 History of falling: Secondary | ICD-10-CM | POA: Diagnosis not present

## 2018-04-18 DIAGNOSIS — N319 Neuromuscular dysfunction of bladder, unspecified: Secondary | ICD-10-CM | POA: Diagnosis not present

## 2018-04-18 DIAGNOSIS — M810 Age-related osteoporosis without current pathological fracture: Secondary | ICD-10-CM | POA: Diagnosis not present

## 2018-04-18 DIAGNOSIS — E7800 Pure hypercholesterolemia, unspecified: Secondary | ICD-10-CM | POA: Diagnosis not present

## 2018-04-18 DIAGNOSIS — E039 Hypothyroidism, unspecified: Secondary | ICD-10-CM | POA: Diagnosis not present

## 2018-04-18 DIAGNOSIS — Z87440 Personal history of urinary (tract) infections: Secondary | ICD-10-CM | POA: Diagnosis not present

## 2018-04-18 DIAGNOSIS — I110 Hypertensive heart disease with heart failure: Secondary | ICD-10-CM | POA: Diagnosis not present

## 2018-04-18 DIAGNOSIS — M519 Unspecified thoracic, thoracolumbar and lumbosacral intervertebral disc disorder: Secondary | ICD-10-CM | POA: Diagnosis not present

## 2018-04-22 ENCOUNTER — Other Ambulatory Visit: Payer: Self-pay | Admitting: Cardiovascular Disease

## 2018-04-22 DIAGNOSIS — R309 Painful micturition, unspecified: Secondary | ICD-10-CM | POA: Diagnosis not present

## 2018-04-22 DIAGNOSIS — R3915 Urgency of urination: Secondary | ICD-10-CM | POA: Diagnosis not present

## 2018-04-22 NOTE — Telephone Encounter (Signed)
Rx request sent to pharmacy.  

## 2018-04-23 DIAGNOSIS — R309 Painful micturition, unspecified: Secondary | ICD-10-CM | POA: Diagnosis not present

## 2018-04-23 DIAGNOSIS — G4733 Obstructive sleep apnea (adult) (pediatric): Secondary | ICD-10-CM | POA: Diagnosis not present

## 2018-04-23 DIAGNOSIS — K219 Gastro-esophageal reflux disease without esophagitis: Secondary | ICD-10-CM | POA: Diagnosis not present

## 2018-04-23 DIAGNOSIS — E039 Hypothyroidism, unspecified: Secondary | ICD-10-CM | POA: Diagnosis not present

## 2018-04-23 DIAGNOSIS — M519 Unspecified thoracic, thoracolumbar and lumbosacral intervertebral disc disorder: Secondary | ICD-10-CM | POA: Diagnosis not present

## 2018-04-23 DIAGNOSIS — I252 Old myocardial infarction: Secondary | ICD-10-CM | POA: Diagnosis not present

## 2018-04-23 DIAGNOSIS — M810 Age-related osteoporosis without current pathological fracture: Secondary | ICD-10-CM | POA: Diagnosis not present

## 2018-04-23 DIAGNOSIS — E7800 Pure hypercholesterolemia, unspecified: Secondary | ICD-10-CM | POA: Diagnosis not present

## 2018-04-23 DIAGNOSIS — I2510 Atherosclerotic heart disease of native coronary artery without angina pectoris: Secondary | ICD-10-CM | POA: Diagnosis not present

## 2018-04-23 DIAGNOSIS — I4891 Unspecified atrial fibrillation: Secondary | ICD-10-CM | POA: Diagnosis not present

## 2018-04-23 DIAGNOSIS — E119 Type 2 diabetes mellitus without complications: Secondary | ICD-10-CM | POA: Diagnosis not present

## 2018-04-23 DIAGNOSIS — Z87440 Personal history of urinary (tract) infections: Secondary | ICD-10-CM | POA: Diagnosis not present

## 2018-04-23 DIAGNOSIS — Z7982 Long term (current) use of aspirin: Secondary | ICD-10-CM | POA: Diagnosis not present

## 2018-04-23 DIAGNOSIS — I509 Heart failure, unspecified: Secondary | ICD-10-CM | POA: Diagnosis not present

## 2018-04-23 DIAGNOSIS — J45909 Unspecified asthma, uncomplicated: Secondary | ICD-10-CM | POA: Diagnosis not present

## 2018-04-23 DIAGNOSIS — Z9181 History of falling: Secondary | ICD-10-CM | POA: Diagnosis not present

## 2018-04-23 DIAGNOSIS — I110 Hypertensive heart disease with heart failure: Secondary | ICD-10-CM | POA: Diagnosis not present

## 2018-04-23 DIAGNOSIS — N319 Neuromuscular dysfunction of bladder, unspecified: Secondary | ICD-10-CM | POA: Diagnosis not present

## 2018-04-24 DIAGNOSIS — Z7982 Long term (current) use of aspirin: Secondary | ICD-10-CM | POA: Diagnosis not present

## 2018-04-24 DIAGNOSIS — E7800 Pure hypercholesterolemia, unspecified: Secondary | ICD-10-CM | POA: Diagnosis not present

## 2018-04-24 DIAGNOSIS — I252 Old myocardial infarction: Secondary | ICD-10-CM | POA: Diagnosis not present

## 2018-04-24 DIAGNOSIS — I509 Heart failure, unspecified: Secondary | ICD-10-CM | POA: Diagnosis not present

## 2018-04-24 DIAGNOSIS — E119 Type 2 diabetes mellitus without complications: Secondary | ICD-10-CM | POA: Diagnosis not present

## 2018-04-24 DIAGNOSIS — I110 Hypertensive heart disease with heart failure: Secondary | ICD-10-CM | POA: Diagnosis not present

## 2018-04-24 DIAGNOSIS — J45909 Unspecified asthma, uncomplicated: Secondary | ICD-10-CM | POA: Diagnosis not present

## 2018-04-24 DIAGNOSIS — M519 Unspecified thoracic, thoracolumbar and lumbosacral intervertebral disc disorder: Secondary | ICD-10-CM | POA: Diagnosis not present

## 2018-04-24 DIAGNOSIS — I2510 Atherosclerotic heart disease of native coronary artery without angina pectoris: Secondary | ICD-10-CM | POA: Diagnosis not present

## 2018-04-24 DIAGNOSIS — Z9181 History of falling: Secondary | ICD-10-CM | POA: Diagnosis not present

## 2018-04-24 DIAGNOSIS — E039 Hypothyroidism, unspecified: Secondary | ICD-10-CM | POA: Diagnosis not present

## 2018-04-24 DIAGNOSIS — K219 Gastro-esophageal reflux disease without esophagitis: Secondary | ICD-10-CM | POA: Diagnosis not present

## 2018-04-24 DIAGNOSIS — M810 Age-related osteoporosis without current pathological fracture: Secondary | ICD-10-CM | POA: Diagnosis not present

## 2018-04-24 DIAGNOSIS — Z87440 Personal history of urinary (tract) infections: Secondary | ICD-10-CM | POA: Diagnosis not present

## 2018-04-24 DIAGNOSIS — G4733 Obstructive sleep apnea (adult) (pediatric): Secondary | ICD-10-CM | POA: Diagnosis not present

## 2018-04-24 DIAGNOSIS — N319 Neuromuscular dysfunction of bladder, unspecified: Secondary | ICD-10-CM | POA: Diagnosis not present

## 2018-04-24 DIAGNOSIS — I4891 Unspecified atrial fibrillation: Secondary | ICD-10-CM | POA: Diagnosis not present

## 2018-04-25 DIAGNOSIS — L820 Inflamed seborrheic keratosis: Secondary | ICD-10-CM | POA: Diagnosis not present

## 2018-04-26 ENCOUNTER — Other Ambulatory Visit: Payer: Self-pay | Admitting: Obstetrics and Gynecology

## 2018-04-26 DIAGNOSIS — Z1231 Encounter for screening mammogram for malignant neoplasm of breast: Secondary | ICD-10-CM

## 2018-04-27 DIAGNOSIS — I2510 Atherosclerotic heart disease of native coronary artery without angina pectoris: Secondary | ICD-10-CM | POA: Diagnosis not present

## 2018-04-27 DIAGNOSIS — M519 Unspecified thoracic, thoracolumbar and lumbosacral intervertebral disc disorder: Secondary | ICD-10-CM | POA: Diagnosis not present

## 2018-04-27 DIAGNOSIS — I110 Hypertensive heart disease with heart failure: Secondary | ICD-10-CM | POA: Diagnosis not present

## 2018-04-27 DIAGNOSIS — M810 Age-related osteoporosis without current pathological fracture: Secondary | ICD-10-CM | POA: Diagnosis not present

## 2018-04-27 DIAGNOSIS — J45909 Unspecified asthma, uncomplicated: Secondary | ICD-10-CM | POA: Diagnosis not present

## 2018-04-27 DIAGNOSIS — I4891 Unspecified atrial fibrillation: Secondary | ICD-10-CM | POA: Diagnosis not present

## 2018-04-27 DIAGNOSIS — K219 Gastro-esophageal reflux disease without esophagitis: Secondary | ICD-10-CM | POA: Diagnosis not present

## 2018-04-27 DIAGNOSIS — Z9181 History of falling: Secondary | ICD-10-CM | POA: Diagnosis not present

## 2018-04-27 DIAGNOSIS — E119 Type 2 diabetes mellitus without complications: Secondary | ICD-10-CM | POA: Diagnosis not present

## 2018-04-27 DIAGNOSIS — E7800 Pure hypercholesterolemia, unspecified: Secondary | ICD-10-CM | POA: Diagnosis not present

## 2018-04-27 DIAGNOSIS — I252 Old myocardial infarction: Secondary | ICD-10-CM | POA: Diagnosis not present

## 2018-04-27 DIAGNOSIS — I509 Heart failure, unspecified: Secondary | ICD-10-CM | POA: Diagnosis not present

## 2018-04-27 DIAGNOSIS — N319 Neuromuscular dysfunction of bladder, unspecified: Secondary | ICD-10-CM | POA: Diagnosis not present

## 2018-04-27 DIAGNOSIS — G4733 Obstructive sleep apnea (adult) (pediatric): Secondary | ICD-10-CM | POA: Diagnosis not present

## 2018-04-27 DIAGNOSIS — Z7982 Long term (current) use of aspirin: Secondary | ICD-10-CM | POA: Diagnosis not present

## 2018-04-27 DIAGNOSIS — E039 Hypothyroidism, unspecified: Secondary | ICD-10-CM | POA: Diagnosis not present

## 2018-04-27 DIAGNOSIS — Z87440 Personal history of urinary (tract) infections: Secondary | ICD-10-CM | POA: Diagnosis not present

## 2018-04-29 DIAGNOSIS — I252 Old myocardial infarction: Secondary | ICD-10-CM | POA: Diagnosis not present

## 2018-04-29 DIAGNOSIS — I4891 Unspecified atrial fibrillation: Secondary | ICD-10-CM | POA: Diagnosis not present

## 2018-04-29 DIAGNOSIS — I110 Hypertensive heart disease with heart failure: Secondary | ICD-10-CM | POA: Diagnosis not present

## 2018-04-29 DIAGNOSIS — Z87440 Personal history of urinary (tract) infections: Secondary | ICD-10-CM | POA: Diagnosis not present

## 2018-04-29 DIAGNOSIS — G4733 Obstructive sleep apnea (adult) (pediatric): Secondary | ICD-10-CM | POA: Diagnosis not present

## 2018-04-29 DIAGNOSIS — E119 Type 2 diabetes mellitus without complications: Secondary | ICD-10-CM | POA: Diagnosis not present

## 2018-04-29 DIAGNOSIS — I509 Heart failure, unspecified: Secondary | ICD-10-CM | POA: Diagnosis not present

## 2018-04-29 DIAGNOSIS — E039 Hypothyroidism, unspecified: Secondary | ICD-10-CM | POA: Diagnosis not present

## 2018-04-29 DIAGNOSIS — M519 Unspecified thoracic, thoracolumbar and lumbosacral intervertebral disc disorder: Secondary | ICD-10-CM | POA: Diagnosis not present

## 2018-04-29 DIAGNOSIS — N319 Neuromuscular dysfunction of bladder, unspecified: Secondary | ICD-10-CM | POA: Diagnosis not present

## 2018-04-29 DIAGNOSIS — E7800 Pure hypercholesterolemia, unspecified: Secondary | ICD-10-CM | POA: Diagnosis not present

## 2018-04-29 DIAGNOSIS — K219 Gastro-esophageal reflux disease without esophagitis: Secondary | ICD-10-CM | POA: Diagnosis not present

## 2018-04-29 DIAGNOSIS — J45909 Unspecified asthma, uncomplicated: Secondary | ICD-10-CM | POA: Diagnosis not present

## 2018-04-29 DIAGNOSIS — Z7982 Long term (current) use of aspirin: Secondary | ICD-10-CM | POA: Diagnosis not present

## 2018-04-29 DIAGNOSIS — Z9181 History of falling: Secondary | ICD-10-CM | POA: Diagnosis not present

## 2018-04-29 DIAGNOSIS — M810 Age-related osteoporosis without current pathological fracture: Secondary | ICD-10-CM | POA: Diagnosis not present

## 2018-04-29 DIAGNOSIS — I2510 Atherosclerotic heart disease of native coronary artery without angina pectoris: Secondary | ICD-10-CM | POA: Diagnosis not present

## 2018-04-30 DIAGNOSIS — H903 Sensorineural hearing loss, bilateral: Secondary | ICD-10-CM | POA: Diagnosis not present

## 2018-04-30 DIAGNOSIS — H6123 Impacted cerumen, bilateral: Secondary | ICD-10-CM | POA: Diagnosis not present

## 2018-05-22 ENCOUNTER — Ambulatory Visit: Payer: Medicare Other | Admitting: Cardiovascular Disease

## 2018-05-23 ENCOUNTER — Encounter: Payer: Self-pay | Admitting: Cardiovascular Disease

## 2018-05-23 ENCOUNTER — Ambulatory Visit: Payer: Medicare Other | Admitting: Cardiovascular Disease

## 2018-05-23 VITALS — BP 134/60 | HR 96 | Ht 60.0 in | Wt 170.0 lb

## 2018-05-23 DIAGNOSIS — I2510 Atherosclerotic heart disease of native coronary artery without angina pectoris: Secondary | ICD-10-CM

## 2018-05-23 DIAGNOSIS — R600 Localized edema: Secondary | ICD-10-CM

## 2018-05-23 DIAGNOSIS — E7800 Pure hypercholesterolemia, unspecified: Secondary | ICD-10-CM

## 2018-05-23 DIAGNOSIS — I10 Essential (primary) hypertension: Secondary | ICD-10-CM

## 2018-05-23 NOTE — Patient Instructions (Signed)
Medication Instructions:  Your physician recommends that you continue on your current medications as directed. Please refer to the Current Medication list given to you today.  If you need a refill on your cardiac medications before your next appointment, please call your pharmacy.   Lab work: NONE  Testing/Procedures: NONE  Follow-Up: At [facility], you and your health needs are our priority.  As part of our continuing mission to provide you with exceptional heart care, we have created designated Provider Care Teams.  These Care Teams include your primary Cardiologist (physician) and Advanced Practice Providers (APPs -  Physician Assistants and Nurse Practitioners) who all work together to provide you with the care you need, when you need it. You will need a follow up appointment in 4 months.  Please call our office 2 months in advance to schedule this appointment.  You may see DR RANDOLP or one of the following Advanced Practice Providers on your designated Care Team:   Kerin Ransom, PA-C Roby Lofts, [place] . Sande Rives, PA-C

## 2018-05-23 NOTE — Progress Notes (Signed)
Cardiology Office Note   Date:  05/23/2018   ID:  [name], DOB 19-Sep-1945, MRN [id number]  PCP:  Deland Pretty, MD  Cardiologist:   Skeet Latch, MD   No chief complaint on file.    History of Present Illness: [name] is a 73 y.o. female with HTN, Takotsubo syndrome, DM, post-Polio syndrome, and hypothyroidism who presents for follow up. [name] was first seen 12/2014 for hypertension and atypical chest pain.  At that appointment she had lower extremity edema.  Furosemide 3 times weekly was added to her regimen.  She also reported atypical chest pain and was referred for [facility] 12/31/14 that revealed LVEF 71% with no evidence of ischemia was seen in the ED 07/30/16.  [name] was seen in the ED 07/2016 with chest tightness and poorly controlled blood pressure. Cardiac enzymes were negative. Her EKG showed nonspecific ST-T changes. She was instructed to follow-up with cardiology as an outpatient.  She had a Lexiscan Myoview 08/15/16 that revealed LVEF 67% with concern for basal to mid inferior ischemia.  She subsequently had a cardiac CT-A that revealed 30% LAD stenosis and no obstructive disease.    Metoprolol was switched to carvedilol due to high blood pressures and low heart rates.  Amlodipine was discontinued due to lower extremity edema, which has improved since making that change.  At her last appointment her BP was elevated so furosemide was switched to HCTZ. This was subsequently held by her PCP due to hyponatremia.  Her sodium was 125.  She subsequently had it rechecked and states that it was back to normal.  She is back on the hydrochlorothiazide.  Her swelling has been well-controlled.  She has been feeling pretty well outside of one episode of shortness of breath.  She took a nitroglycerin because she was not able to breathe well.  She thinks that was because there was some smoke in her house at the time.  She has not had any chest pain or pressure.  She has no  orthopnea or PND.  Her PCP recommended that she take amlodipine but she decided not to take it due to concern it might make her swell.  Her blood pressure has been well-controlled at home.   Past Medical History:  Diagnosis Date  . Angina pectoris ([place]) 08/02/2016  . Anxiety   . Arthritis    "hands, knees, back" (07/18/2013)  . Asthma   . CHF (congestive heart failure) ([place])   . Chronic back pain   . Chronic lower back pain   . Family history of anesthesia complication    Mother and Sister- N/V  . GERD (gastroesophageal reflux disease)   . H/O hiatal hernia   . YFVCBSWH(675.9)    "weekly" (07/18/2013)  . Hyperlipemia   . Hypertension   . Hypothyroidism   . Insomnia   . Kidney stones   . Mental disorder   . Migraine    "years ago" (07/18/2013)  . Osteopenia   . Post-polio syndrome   . Post-polio syndrome   . Sleep apnea   . Takotsubo cardiomyopathy   . Type II diabetes mellitus ([place])     Past Surgical History:  Procedure Laterality Date  . ABDOMINAL HYSTERECTOMY    . APPENDECTOMY    . CARPAL TUNNEL RELEASE Bilateral   . CHOLECYSTECTOMY    . COLONOSCOPY    . CYSTOSCOPY    . KNEE ARTHROSCOPY Left   . KNEE SURGERY Left   . LEG SURGERY Left  polio  . ORIF PATELLA Left 07/18/2013   Procedure: OPEN REDUCTION INTERNAL (ORIF) LEFT FIXATION PATELLA;  Surgeon: Augustin Schooling, MD;  Location: [place];  Service: Orthopedics;  Laterality: Left;  . ORIF PATELLA FRACTURE Left 07/18/2013  . TUBAL LIGATION    . WISDOM TOOTH EXTRACTION    . WRIST TENDON TRANSFER Right      Current Outpatient Medications  Medication Sig Dispense Refill  . aspirin 81 MG tablet Take 81 mg by mouth at bedtime.     Marland Kitchen atorvastatin (LIPITOR) 40 MG tablet Take 40 mg by mouth daily.    Marland Kitchen b complex vitamins tablet Take 1 tablet by mouth daily.    . benzonatate (TESSALON) 100 MG capsule Take 100 mg by mouth 3 (three) times daily as needed for cough.    . busPIRone (BUSPAR) 7.5 MG tablet Take 7.5 mg by mouth  daily.    . Calcium Carbonate-Vitamin D (CALTRATE 600+D) 600-400 MG-UNIT per chew tablet Chew 1 tablet by mouth 2 (two) times daily.     . carvedilol (COREG) 12.5 MG tablet TAKE 1 TABLET BY MOUTH TWICE DAILY. 180 tablet 2  . clonazePAM (KLONOPIN) 0.5 MG tablet Take 0.5 mg by mouth 2 (two) times daily.     . fish oil-omega-3 fatty acids 1000 MG capsule Take 2 g by mouth 2 (two) times daily.      Marland Kitchen glimepiride (AMARYL) 1 MG tablet Take 1 mg by mouth daily with breakfast.    . hydrochlorothiazide (HYDRODIURIL) 25 MG tablet Take 1 tablet (25 mg total) by mouth daily. 90 tablet 0  . levothyroxine (SYNTHROID, LEVOTHROID) 100 MCG tablet Take 100 mcg by mouth daily before breakfast.    . metFORMIN (GLUCOPHAGE) 500 MG tablet Take 1,000 mg by mouth 2 (two) times daily with a meal.     . mirabegron ER (MYRBETRIQ) 50 MG TB24 tablet Take 50 mg by mouth daily.    . Multiple Vitamins-Minerals (MULTIVITAMIN WITH MINERALS) tablet Take 1 tablet by mouth daily.      . nitroGLYCERIN (NITROSTAT) 0.4 MG SL tablet Place 0.4 mg under the tongue every 5 (five) minutes as needed for chest pain.    Marland Kitchen nystatin cream (MYCOSTATIN) Apply 1 application topically daily as needed for dry skin (irritation. boils on buttocks). Use as needed  0  . pantoprazole (PROTONIX) 40 MG tablet Take 1 tablet by mouth daily. Take 1 tab daily  98  . telmisartan (MICARDIS) 80 MG tablet Take 1 tablet by mouth daily. Take 1 tab daily  0  . tiZANidine (ZANAFLEX) 4 MG tablet Take 4 mg by mouth 5 (five) times daily as needed for muscle spasms. PAIN    . traMADol (ULTRAM) 50 MG tablet Take 1 tablet (50 mg total) by mouth every 6 (six) hours as needed. 8 tablet 0  . vitamin E (VITAMIN E) 400 UNIT capsule Take 400 Units by mouth daily.    Marland Kitchen zolpidem (AMBIEN) 5 MG tablet Take 5 mg by mouth at bedtime as needed for sleep.     No current facility-administered medications for this visit.     Allergies:   Ciprofloxacin; Carisoprodol-aspirin; Penicillins;  Sulfa antibiotics; Acetaminophen; Aspirin; Macrodantin [nitrofurantoin macrocrystal]; Nsaids; Percocet [oxycodone-acetaminophen]; Propoxyphene; Propoxyphene n-acetaminophen; Soma compound [carisoprodol-aspirin]; and Tolectin [tolmetin]    Social History:  The patient  reports that she has never smoked. She has never used smokeless tobacco. She reports that she does not drink alcohol or use drugs.   Family History:  The patient's family history  includes Allergies in her grandchild; Cancer in her father; Diabetes in her father; Heart failure in her father and mother; Parkinson's disease in her mother.    ROS:  Please see the history of present illness.   Otherwise, review of systems are positive for back pain, leg pain, headaches.   All other systems are reviewed and negative.    PHYSICAL EXAM: VS:  BP 134/60   Pulse 96   Ht 5' (1.524 m)   Wt 170 lb (77.1 kg)   SpO2 95%   BMI 33.20 kg/m  , BMI Body mass index is 33.2 kg/m. GENERAL:  Well appearing.  In wheelchair. HEENT: Pupils equal round and reactive, fundi not visualized, oral mucosa unremarkable NECK:  No jugular venous distention, waveform within normal limits, carotid upstroke brisk and symmetric, no bruits LUNGS:  Clear to auscultation bilaterally HEART:  RRR.  PMI not displaced or sustained,S1 and S2 within normal limits, no S3, no S4, no clicks, no rubs, no murmurs ABD:  Flat, positive bowel sounds normal in frequency in pitch, no bruits, no rebound, no guarding, no midline pulsatile mass, no hepatomegaly, no splenomegaly EXT:  2 plus pulses throughout, no edema, no cyanosis no clubbing SKIN:  No rashes no nodules NEURO:  Cranial nerves II through XII grossly intact, motor grossly intact throughout PSYCH:  Cognitively intact, oriented to person place and time   EKG:  EKG is not ordered today. The ekg ordered today demonstrates sinus rhythm.  61 bpm.  L axis deviation.  Low voltage. 07/31/16: Sinus rhythm. Rate 66. 08/15/17:  Sinus bradycardia.  Rate 58 bpm.  01/25/18: Sinus bradycardia.  Rae 55 bpm.    Recent Labs: 09/24/2017: ALT 19; BUN 16; Creatinine, Ser 0.60; Hemoglobin 13.3; Platelets 467; Potassium 4.0; Sodium 136    Lipid Panel No results found for: CHOL, TRIG, HDL, CHOLHDL, VLDL, LDLCALC, LDLDIRECT   11/23/14: Chol 111, tri 106, hdl 42, ldl 48 Hgb A1c 5.5  03/23/16:   Sodium 138, potassium 4.7, BUN 13, creatinine 0.49 AST 33, ALT 64 Total cholesterol 101, triglycerides 72, HDL 44, LDL 43  02/20/17: Sodium 138, potassium 4.3, BUN 12, creatinine 0.6 on AST 14, ALT 29 Hemoglobin A1c 5.6% Total cholesterol 120, triglycerides 63, HDL 47, LDL 60 TSH 1.60   Wt Readings from Last 3 Encounters:  05/23/18 170 lb (77.1 kg)  01/25/18 185 lb (83.9 kg)  08/15/17 162 lb (73.5 kg)    TTE 01/31/10: EF 55-60%.  No wall motion abnormalities.  Grade 1 diastolic dysfunction.  Calcification of anterior mitral chordae.  Mildly elevated PASP.  Lexiscan Myoview 08/15/16: The left ventricular ejection fraction is hyperdynamic (>65%).  Nuclear stress EF: 67%.  There was no ST segment deviation noted during stress.  Defect 1: There is a small defect of mild severity present in the basal inferior and mid inferior location.  Findings consistent with ischemia.  This is a low risk study.  Cardiac CT-A 08/29/16:  IMPRESSION: 1) Left dominant coronary arteries with non obstructive disease in proximal and mid LAD  2) Calcium score 56 which is 59th percentile for age and sex  3) Normal aortic root 31 mm   Other studies Reviewed: Additional studies/ records that were reviewed today include: medical record. Review of the above records demonstrates:  Please see elsewhere in the note.     ASSESSMENT AND PLAN:   # Hypertension: Her blood pressure has been controlled and is minimally elevated here.  Continue carvedilol, HCTZ, and telmisartan.  She is due  to have her sodium rechecked with her PCP next week.  #  Chest pain: # Non-obstructive CAD: Resolved. Cardiac CT-A 08/2016 was very reassuring. She has non--obstructive coronary disease. Her symptoms are likely due to stress and anxiety.  Continue aspirin 81 mg daily and atorvastatin.  # Hyperlipidemia:  LDL 60 02/2017. She is now on atorvastatin.   She will be having lipids checked with her PCP next week.  She will asked them to forward these results to [place].  # Takotsubo cardiomyopathy: Resolved.  LVEF 67% 08/2016.     Current medicines are reviewed at length with the patient today.  The patient does not have concerns regarding medicines.  The following changes have been made:  none  Labs/ tests ordered today include:    No orders of the defined types were placed in this encounter.    Disposition:   FU with Dr. Jonelle Sidle C. Kemmerer in 5 months.  Signed, Skeet Latch, MD  05/23/2018 11:48 AM    [place]

## 2018-06-03 ENCOUNTER — Encounter: Payer: Self-pay | Admitting: Cardiovascular Disease

## 2018-06-03 DIAGNOSIS — I10 Essential (primary) hypertension: Secondary | ICD-10-CM | POA: Diagnosis not present

## 2018-06-03 DIAGNOSIS — E119 Type 2 diabetes mellitus without complications: Secondary | ICD-10-CM | POA: Diagnosis not present

## 2018-06-03 DIAGNOSIS — E7800 Pure hypercholesterolemia, unspecified: Secondary | ICD-10-CM | POA: Diagnosis not present

## 2018-06-03 DIAGNOSIS — E559 Vitamin D deficiency, unspecified: Secondary | ICD-10-CM | POA: Diagnosis not present

## 2018-06-03 DIAGNOSIS — E039 Hypothyroidism, unspecified: Secondary | ICD-10-CM | POA: Diagnosis not present

## 2018-06-06 DIAGNOSIS — K219 Gastro-esophageal reflux disease without esophagitis: Secondary | ICD-10-CM | POA: Diagnosis not present

## 2018-06-06 DIAGNOSIS — E785 Hyperlipidemia, unspecified: Secondary | ICD-10-CM | POA: Diagnosis not present

## 2018-06-06 DIAGNOSIS — E119 Type 2 diabetes mellitus without complications: Secondary | ICD-10-CM | POA: Diagnosis not present

## 2018-06-06 DIAGNOSIS — I10 Essential (primary) hypertension: Secondary | ICD-10-CM | POA: Diagnosis not present

## 2018-06-06 DIAGNOSIS — Z0000 Encounter for general adult medical examination without abnormal findings: Secondary | ICD-10-CM | POA: Diagnosis not present

## 2018-06-07 ENCOUNTER — Ambulatory Visit: Payer: Medicare Other

## 2018-06-28 ENCOUNTER — Other Ambulatory Visit: Payer: Self-pay | Admitting: Cardiovascular Disease

## 2018-07-03 ENCOUNTER — Ambulatory Visit: Payer: Medicare Other

## 2018-07-09 ENCOUNTER — Ambulatory Visit
Admission: RE | Admit: 2018-07-09 | Discharge: 2018-07-09 | Disposition: A | Discharge status: Home / Self Care | Payer: Medicare Other | Source: Ambulatory Visit | Attending: Obstetrics and Gynecology | Admitting: Obstetrics and Gynecology

## 2018-07-09 DIAGNOSIS — Z1231 Encounter for screening mammogram for malignant neoplasm of breast: Secondary | ICD-10-CM

## 2018-08-07 ENCOUNTER — Ambulatory Visit: Payer: Medicare Other

## 2018-08-29 ENCOUNTER — Telehealth: Payer: Self-pay | Admitting: Cardiovascular Disease

## 2018-08-29 NOTE — Telephone Encounter (Signed)
Pt c/o swelling: STAT is pt has developed SOB within 24 hours  1) How much weight have you gained and in what time span?  Do not know whether she have or not- she  is in a wheelchair  2) If swelling, where is the swelling located? Feet and legs  3) Are you currently taking a fluid pill? yes Hydrochlorothiazide 4) Are you currently keeping  5) e a log of your daily weights (if so, list)? no 6) Have you gained 3 pounds in a day or 5 pounds in a week?  no  7) Have you traveled recently? no

## 2018-08-30 NOTE — Telephone Encounter (Signed)
Pt reports that over the last 4 days she has had increased swelling in her feet up to her knees... she cannot wear her normal shoes... she cannot weigh herself since in a wheelchair and does not know her BP... she denies SOB... she is still on all of her meds.Marland Kitchen asking of she can increase her HCTZ. Will forward to Dr. Oval Linsey for recommendation..  Advised pt to watch her NA intake and to try and elevate her feet.

## 2018-08-30 NOTE — Telephone Encounter (Signed)
Advised patient, verbalized understanding.  Will call back if no improvement

## 2018-08-30 NOTE — Telephone Encounter (Signed)
She is on the max dose of HCTZ.  OK to take lasix 20mg  for 2 days in addition to her HCTZ.

## 2018-09-25 ENCOUNTER — Telehealth: Payer: Self-pay | Admitting: Cardiovascular Disease

## 2018-09-25 NOTE — Telephone Encounter (Signed)
LMTCB. Patient has appt with Dr. Oval Linsey on May 19.  Need to ask patient if a virtual visit is OK.  If so, please changed appt type, find out if has smart phone.  If she does not want virtual, r/s appt for September when [place] comes back or with PA if patient wants visit sooner/dc

## 2018-09-30 ENCOUNTER — Telehealth: Payer: Self-pay | Admitting: Cardiovascular Disease

## 2018-10-01 ENCOUNTER — Telehealth (INDEPENDENT_AMBULATORY_CARE_PROVIDER_SITE_OTHER): Payer: Medicare Other | Admitting: Cardiovascular Disease

## 2018-10-01 DIAGNOSIS — E7800 Pure hypercholesterolemia, unspecified: Secondary | ICD-10-CM | POA: Diagnosis not present

## 2018-10-01 DIAGNOSIS — I25811 Atherosclerosis of native coronary artery of transplanted heart without angina pectoris: Secondary | ICD-10-CM | POA: Diagnosis not present

## 2018-10-01 DIAGNOSIS — I5181 Takotsubo syndrome: Secondary | ICD-10-CM

## 2018-10-01 DIAGNOSIS — I10 Essential (primary) hypertension: Secondary | ICD-10-CM

## 2018-10-01 MED ORDER — HYDRALAZINE HCL 50 MG PO TABS: 50.0000 mg | ORAL_TABLET | Freq: Two times a day (BID) | ORAL | 5 refills | Status: DC

## 2018-10-01 NOTE — Patient Instructions (Addendum)
Medication Instructions:  STOP AMLODIPINE  START HYDRALAZINE 50 MG TWICE A DAY  If you need a refill on your cardiac medications before your next appointment, please call your pharmacy.   Lab work: NONE  Testing/Procedures: NONE  Follow-Up: At [facility], you and your health needs are our priority.  As part of our continuing mission to provide you with exceptional heart care, we have created designated Provider Care Teams.  These Care Teams include your primary Cardiologist (physician) and Advanced Practice Providers (APPs -  Physician Assistants and Nurse Practitioners) who all work together to provide you with the care you need, when you need it. You will need a follow up appointment in 4 months.  Please call our office 2 months in advance to schedule this appointment.  You may see Skeet Latch, MD or one of the following Advanced Practice Providers on your designated Care Team:   Kerin Ransom, PA-C Roby Lofts, [place] . Sande Rives, PA-C  Your physician recommends that you schedule a follow-up appointment in: [place]  11/04/18 AT 3:00

## 2018-10-01 NOTE — Progress Notes (Signed)
Virtual Visit via Telephone Note   This visit type was conducted due to national recommendations for restrictions regarding the COVID-19 Pandemic (e.g. social distancing) in an effort to limit this patient's exposure and mitigate transmission in our community.  Due to her co-morbid illnesses, this patient is at least at moderate risk for complications without adequate follow up.  This format is felt to be most appropriate for this patient at this time.  The patient did not have access to video technology/had technical difficulties with video requiring transitioning to audio format only (telephone).  All issues noted in this document were discussed and addressed.  No physical exam could be performed with this format.  Please refer to the patient's chart for her  consent to telehealth for [facility].   Date:  10/01/2018   ID:  [name], DOB 06-18-1945, MRN [id number]  Patient Location: Home Provider Location: Office  PCP:  Deland Pretty, MD  Cardiologist:  Skeet Latch, MD  Electrophysiologist:  None   Evaluation Performed:  Follow-Up Visit  Chief Complaint:  edema  History of Present Illness:    [name] is a 73 y.o. female with HTN, mild-non-obstructive CAD, Takotsubo syndrome, DM, post-Polio syndrome, and hypothyroidism who presents for follow up. [name] was first seen 12/2014 for hypertension and atypical chest pain.  At that appointment she had lower extremity edema.  Furosemide 3 times weekly was added to her regimen.  She also reported atypical chest pain and was referred for [facility] 12/31/14 that revealed LVEF 71% with no evidence of ischemia was seen in the ED 07/30/16.  [name] was seen in the ED 07/2016 with chest tightness and poorly controlled blood pressure. Cardiac enzymes were negative. Her EKG showed nonspecific ST-T changes. She was instructed to follow-up with cardiology as an outpatient.  She had a Lexiscan Myoview 08/15/16 that revealed LVEF 67% with  concern for basal to mid inferior ischemia.  She subsequently had a cardiac CT-A that revealed 30% LAD stenosis and no obstructive disease.    Metoprolol was switched to carvedilol due to high blood pressures and low heart rates.  Amlodipine was discontinued due to lower extremity edema, which improved after making that change.  Her BP was elevated so furosemide was switched to HCTZ. This was subsequently held by her PCP due to hyponatremia but later resumed once her sodium improved.  Amlodipine was added back to her regimen and her blood pressure remains poorly-controlled.  She also has increased lower extremity edema.  Otherwise she has felt well.  She has been struggling with a cold for the last 2 or 3 days.  She has been coughing and sneezing and taking over-the-counter cold medication without relief.  She has no orthopnea or PND.  Her breathing has been stable.  She denies chest pain or pressure.  She has not had any COVID-19 exposures and has been staying in her home.  She denies fever or shortness of breath but has experienced chills.  The patient does have symptoms concerning for COVID-19 infection (fever, chills, cough, or new shortness of breath).    Past Medical History:  Diagnosis Date   Angina pectoris ([place]) 08/02/2016   Anxiety    Arthritis    "hands, knees, back" (07/18/2013)   Asthma    CHF (congestive heart failure) (HCC)    Chronic back pain    Chronic lower back pain    Family history of anesthesia complication    Mother and Sister- N/V   GERD (  gastroesophageal reflux disease)    H/O hiatal hernia    Headache(784.0)    "weekly" (07/18/2013)   Hyperlipemia    Hypertension    Hypothyroidism    Insomnia    Kidney stones    Mental disorder    Migraine    "years ago" (07/18/2013)   Osteopenia    Post-polio syndrome    Post-polio syndrome    Sleep apnea    Takotsubo cardiomyopathy    Type II diabetes mellitus ([place])    Past Surgical History:    Procedure Laterality Date   ABDOMINAL HYSTERECTOMY     APPENDECTOMY     CARPAL TUNNEL RELEASE Bilateral    CHOLECYSTECTOMY     COLONOSCOPY     CYSTOSCOPY     KNEE ARTHROSCOPY Left    KNEE SURGERY Left    LEG SURGERY Left    polio   ORIF PATELLA Left 07/18/2013   Procedure: OPEN REDUCTION INTERNAL (ORIF) LEFT FIXATION PATELLA;  Surgeon: Augustin Schooling, MD;  Location: [place];  Service: Orthopedics;  Laterality: Left;   ORIF PATELLA FRACTURE Left 07/18/2013   TUBAL LIGATION     WISDOM TOOTH EXTRACTION     WRIST TENDON TRANSFER Right      Current Meds  Medication Sig   amLODipine (NORVASC) 5 MG tablet    aspirin 81 MG tablet Take 81 mg by mouth at bedtime.    atorvastatin (LIPITOR) 40 MG tablet Take 40 mg by mouth daily.   b complex vitamins tablet Take 1 tablet by mouth daily.   benzonatate (TESSALON) 100 MG capsule Take 100 mg by mouth 3 (three) times daily as needed for cough.   busPIRone (BUSPAR) 7.5 MG tablet Take 7.5 mg by mouth daily.   Calcium Carbonate-Vitamin D (CALTRATE 600+D) 600-400 MG-UNIT per chew tablet Chew 1 tablet by mouth 2 (two) times daily.    carvedilol (COREG) 12.5 MG tablet TAKE 1 TABLET BY MOUTH TWICE DAILY.   clonazePAM (KLONOPIN) 0.5 MG tablet Take 0.5 mg by mouth 2 (two) times daily.    fish oil-omega-3 fatty acids 1000 MG capsule Take 2 g by mouth 2 (two) times daily.     glimepiride (AMARYL) 1 MG tablet Take 1 mg by mouth daily with breakfast.   hydrochlorothiazide (HYDRODIURIL) 25 MG tablet TAKE 1 TABLET ONCE DAILY.   HYDROXYZINE PAMOATE PO Take 25 mg by mouth.   levothyroxine (SYNTHROID, LEVOTHROID) 100 MCG tablet Take 100 mcg by mouth daily before breakfast.   mirabegron ER (MYRBETRIQ) 50 MG TB24 tablet Take 50 mg by mouth daily.   Multiple Vitamins-Minerals (MULTIVITAMIN WITH MINERALS) tablet Take 1 tablet by mouth daily.     nitroGLYCERIN (NITROSTAT) 0.4 MG SL tablet Place 0.4 mg under the tongue every 5 (five) minutes  as needed for chest pain.   nystatin cream (MYCOSTATIN) Apply 1 application topically daily as needed for dry skin (irritation. boils on buttocks). Use as needed   pantoprazole (PROTONIX) 40 MG tablet Take 1 tablet by mouth daily. Take 1 tab daily   telmisartan (MICARDIS) 80 MG tablet Take 1 tablet by mouth daily. Take 1 tab daily   tiZANidine (ZANAFLEX) 4 MG tablet Take 4 mg by mouth 5 (five) times daily as needed for muscle spasms. PAIN   traMADol (ULTRAM) 50 MG tablet Take 1 tablet (50 mg total) by mouth every 6 (six) hours as needed.   vitamin E (VITAMIN E) 400 UNIT capsule Take 400 Units by mouth daily.   zolpidem (AMBIEN) 5 MG tablet  Take 5 mg by mouth at bedtime as needed for sleep.     Allergies:   Ciprofloxacin; Carisoprodol-aspirin; Penicillins; Sulfa antibiotics; Acetaminophen; Aspirin; Macrodantin [nitrofurantoin macrocrystal]; Nsaids; Percocet [oxycodone-acetaminophen]; Propoxyphene; Propoxyphene n-acetaminophen; Soma compound [carisoprodol-aspirin]; and Tolectin [tolmetin]   Social History   Tobacco Use   Smoking status: Never Smoker   Smokeless tobacco: Never Used  Substance Use Topics   Alcohol use: No   Drug use: No     Family Hx: The patient's family history includes Allergies in her grandchild; Cancer in her father; Diabetes in her father; Heart failure in her father and mother; Parkinson's disease in her mother.  ROS:   Please see the history of present illness.     All other systems reviewed and are negative.   Prior CV studies:   The following studies were reviewed today:  TTE 01/31/10: EF 55-60%.  No wall motion abnormalities.  Grade 1 diastolic dysfunction.  Calcification of anterior mitral chordae.  Mildly elevated PASP.  Lexiscan Myoview 08/15/16: The left ventricular ejection fraction is hyperdynamic (>65%).  Nuclear stress EF: 67%.  There was no ST segment deviation noted during stress.  Defect 1: There is a small defect of mild severity  present in the basal inferior and mid inferior location.  Findings consistent with ischemia.  This is a low risk study.  Cardiac CT-A 08/29/16:  IMPRESSION: 1) Left dominant coronary arteries with non obstructive disease in proximal and mid LAD  2) Calcium score 56 which is 59th percentile for age and sex  3) Normal aortic root 31 mm   Labs/Other Tests and Data Reviewed:    EKG:  No ECG reviewed.  Recent Labs: No results found for requested labs within last 8760 hours.   Recent Lipid Panel No results found for: CHOL, TRIG, HDL, CHOLHDL, LDLCALC, LDLDIRECT  Wt Readings from Last 3 Encounters:  10/01/18 185 lb (83.9 kg)  05/23/18 170 lb (77.1 kg)  01/25/18 185 lb (83.9 kg)    11/23/14: Chol 111, tri 106, hdl 42, ldl 48 Hgb A1c 5.5  03/23/16:   Sodium 138, potassium 4.7, BUN 13, creatinine 0.49 AST 33, ALT 64 Total cholesterol 101, triglycerides 72, HDL 44, LDL 43  02/20/17: Sodium 138, potassium 4.3, BUN 12, creatinine 0.6 on AST 14, ALT 29 Hemoglobin A1c 5.6% Total cholesterol 120, triglycerides 63, HDL 47, LDL 60 TSH 1.60   Objective:    BP (!) 145/74    Pulse 71    Ht 5' (1.524 m)    Wt 185 lb (83.9 kg)    BMI 36.13 kg/m  GENERAL: Sounds well.  No acute distress.  No respiratory distress. NEURO:  Speech fluent.   PSYCH:  Cognitively intact, oriented to person place and time   ASSESSMENT & PLAN:    # Hypertension: Her blood pressure is poorly controlled.  Her edema has returned since she started back on amlodipine.  Continue telmisartan and carvedilol.  We will stop amlodipine and start hydralazine 50 mg twice daily.  We may need to increase this to 3 times daily, but she does not take any medicines in the afternoon at this time and would prefer to stick with twice daily medications if possible.  Continue HCTZ.  # Chest pain: # Non-obstructive CAD: No recent chest pain. Cardiac CT-A 08/2016 was very reassuring. She has non--obstructive coronary  disease. Her symptoms are likely due to stress and anxiety.  Continue aspirin 81 mg daily and atorvastatin.  # Hyperlipidemia:  LDL 60 02/2017. She is  now on atorvastatin.     Lipids are managed with Dr. Shelia Media.  # Takotsubo cardiomyopathy: Resolved.  LVEF 67% 08/2016.   COVID-19 Education: The signs and symptoms of COVID-19 were discussed with the patient and how to seek care for testing (follow up with PCP or arrange E-visit).  The importance of social distancing was discussed today.  Time:   Today, I have spent 17 minutes with the patient with telehealth technology discussing the above problems.     Medication Adjustments/Labs and Tests Ordered: Current medicines are reviewed at length with the patient today.  Concerns regarding medicines are outlined above.   Tests Ordered: No orders of the defined types were placed in this encounter.   Medication Changes: No orders of the defined types were placed in this encounter.   Disposition:  Follow up with Bunnie Domino, DNP in 1 months.    F/u with Lanecia Sliva C. Oval Linsey, MD, [facility] in 4 months.   Signed, Skeet Latch, MD  10/01/2018 11:40 AM    [place]

## 2018-10-08 DIAGNOSIS — H35033 Hypertensive retinopathy, bilateral: Secondary | ICD-10-CM | POA: Diagnosis not present

## 2018-10-08 DIAGNOSIS — H2513 Age-related nuclear cataract, bilateral: Secondary | ICD-10-CM | POA: Diagnosis not present

## 2018-10-08 DIAGNOSIS — H25043 Posterior subcapsular polar age-related cataract, bilateral: Secondary | ICD-10-CM | POA: Diagnosis not present

## 2018-10-08 DIAGNOSIS — H40033 Anatomical narrow angle, bilateral: Secondary | ICD-10-CM | POA: Diagnosis not present

## 2018-10-08 DIAGNOSIS — H2511 Age-related nuclear cataract, right eye: Secondary | ICD-10-CM | POA: Diagnosis not present

## 2018-10-08 DIAGNOSIS — H2512 Age-related nuclear cataract, left eye: Secondary | ICD-10-CM | POA: Diagnosis not present

## 2018-10-08 DIAGNOSIS — H25013 Cortical age-related cataract, bilateral: Secondary | ICD-10-CM | POA: Diagnosis not present

## 2018-10-11 DIAGNOSIS — N319 Neuromuscular dysfunction of bladder, unspecified: Secondary | ICD-10-CM | POA: Diagnosis not present

## 2018-10-11 DIAGNOSIS — N3020 Other chronic cystitis without hematuria: Secondary | ICD-10-CM | POA: Diagnosis not present

## 2018-10-11 DIAGNOSIS — N261 Atrophy of kidney (terminal): Secondary | ICD-10-CM | POA: Diagnosis not present

## 2018-10-23 DIAGNOSIS — H2511 Age-related nuclear cataract, right eye: Secondary | ICD-10-CM | POA: Diagnosis not present

## 2018-10-23 DIAGNOSIS — H25811 Combined forms of age-related cataract, right eye: Secondary | ICD-10-CM | POA: Diagnosis not present

## 2018-10-23 DIAGNOSIS — H52221 Regular astigmatism, right eye: Secondary | ICD-10-CM | POA: Diagnosis not present

## 2018-10-28 DIAGNOSIS — H2512 Age-related nuclear cataract, left eye: Secondary | ICD-10-CM | POA: Diagnosis not present

## 2018-10-28 DIAGNOSIS — H25012 Cortical age-related cataract, left eye: Secondary | ICD-10-CM | POA: Diagnosis not present

## 2018-11-03 NOTE — Progress Notes (Signed)
Virtual Visit via Telephone Note   This visit type was conducted due to national recommendations for restrictions regarding the COVID-19 Pandemic (e.g. social distancing) in an effort to limit this patient's exposure and mitigate transmission in our community.  Due to her co-morbid illnesses, this patient is at least at moderate risk for complications without adequate follow up.  This format is felt to be most appropriate for this patient at this time.  The patient did not have access to video technology/had technical difficulties with video requiring transitioning to audio format only (telephone).  All issues noted in this document were discussed and addressed.  No physical exam could be performed with this format.  Please refer to the patient's chart for her  consent to telehealth for [facility].   Date:  11/04/2018   ID:  [name], DOB 04-20-1946, MRN [id number]  Patient Location: Home Provider Location: Home  PCP:  Deland Pretty, MD  Cardiologist:  Skeet Latch, MD  Electrophysiologist:  None   Evaluation Performed:  Follow-Up Visit  Chief Complaint:  Hypertension  History of Present Illness:    [name] is a 73 y.o. female we are following for ongoing assessment and management of TakuTsubo's cardiomyopathy, history of mild nonobstructive CAD, hypertension, with other history to include post polio syndrome diabetes type 2 and hypothyroidism.  It was noted that the patient has a history of atypical chest pain and chronic lower extremity edema.  She was placed on Lasix 3 times a week.  Myoview stress test in 2016 revealed no evidence of ischemia.  She had been seen in the emergency room in 2018 with uncontrolled hypertension.    The patient had a cardiac CTA that revealed 30% LAD and no other obstructive disease.  Metoprolol was switched to carvedilol due to hypertension and low heart rates.  Amlodipine was discontinued in the setting of lower extremity edema which did  improve after stopping the medication.  Furosemide was switched to HCTZ.  Amlodipine was added back to her regimen by PCP as her blood pressure was poorly controlled.  She was seen last by Dr. Oval Linsey on 10/01/2018 at that time, blood pressure continue to be poorly controlled and she was found to have some edema since she started back on amlodipine.  She was continued on telmisartan and carvedilol.  Amlodipine was discontinued and she was started on hydralazine 50 mg twice daily.  It is possible that she will need an increase to 3 times daily on this visit.  She was not taking any medications in the afternoon and preferred not to have a third dose of medications.   We will reassess her response to her medication change today.  It was felt that her chest pain was related to stress and anxiety.  Although she did have a 30% LAD, was not felt that she would require further testing at this time.  She was continued on aspirin and atorvastatin.  LDL goal less than 60.  Labs are managed by her primary care physician.   She has multiple complaints today of non-cardiac etiology. She has been having trouble with her eyes and has had cataract surgery. She has not been taking her BPat home as she cannot do it herself, an has to ask her husband to help. She states that he does not like to do this. She denies dizziness or chest pain, DOE.   I have spend 20 minutes going over the mediations with her an discussing their method of action.  The patient does not  have symptoms concerning for COVID-19 infection (fever, chills, cough, or new shortness of breath).    Past Medical History:  Diagnosis Date  . Angina pectoris ([place]) 08/02/2016  . Anxiety   . Arthritis    "hands, knees, back" (07/18/2013)  . Asthma   . CHF (congestive heart failure) ([place])   . Chronic back pain   . Chronic lower back pain   . Family history of anesthesia complication    Mother and Sister- N/V  . GERD (gastroesophageal reflux disease)   .  H/O hiatal hernia   . ZOXWRUEA(540.9)    "weekly" (07/18/2013)  . Hyperlipemia   . Hypertension   . Hypothyroidism   . Insomnia   . Kidney stones   . Mental disorder   . Migraine    "years ago" (07/18/2013)  . Osteopenia   . Post-polio syndrome   . Post-polio syndrome   . Sleep apnea   . Takotsubo cardiomyopathy   . Type II diabetes mellitus ([place])    Past Surgical History:  Procedure Laterality Date  . ABDOMINAL HYSTERECTOMY    . APPENDECTOMY    . CARPAL TUNNEL RELEASE Bilateral   . CHOLECYSTECTOMY    . COLONOSCOPY    . CYSTOSCOPY    . KNEE ARTHROSCOPY Left   . KNEE SURGERY Left   . LEG SURGERY Left    polio  . ORIF PATELLA Left 07/18/2013   Procedure: OPEN REDUCTION INTERNAL (ORIF) LEFT FIXATION PATELLA;  Surgeon: Augustin Schooling, MD;  Location: [facility];  Service: Orthopedics;  Laterality: Left;  . ORIF PATELLA FRACTURE Left 07/18/2013  . TUBAL LIGATION    . WISDOM TOOTH EXTRACTION    . WRIST TENDON TRANSFER Right      Current Meds  Medication Sig  . aspirin 81 MG tablet Take 81 mg by mouth at bedtime.   Marland Kitchen atorvastatin (LIPITOR) 40 MG tablet Take 40 mg by mouth daily.  Marland Kitchen b complex vitamins tablet Take 1 tablet by mouth daily.  . benzonatate (TESSALON) 100 MG capsule Take 100 mg by mouth 3 (three) times daily as needed for cough.  . busPIRone (BUSPAR) 7.5 MG tablet Take 7.5 mg by mouth daily.  . Calcium Carbonate-Vitamin D (CALTRATE 600+D) 600-400 MG-UNIT per chew tablet Chew 1 tablet by mouth 2 (two) times daily.   . carvedilol (COREG) 12.5 MG tablet TAKE 1 TABLET BY MOUTH TWICE DAILY.  . clonazePAM (KLONOPIN) 0.5 MG tablet Take 0.5 mg by mouth 2 (two) times daily.   . fish oil-omega-3 fatty acids 1000 MG capsule Take 2 g by mouth 2 (two) times daily.    Marland Kitchen glimepiride (AMARYL) 1 MG tablet Take 1 mg by mouth daily with breakfast.  . hydrALAZINE (APRESOLINE) 50 MG tablet Take 1 tablet (50 mg total) by mouth 2 (two) times a day.  . hydrochlorothiazide (HYDRODIURIL) 25 MG tablet  TAKE 1 TABLET ONCE DAILY.  Marland Kitchen HYDROXYZINE PAMOATE PO Take 25 mg by mouth.  . levothyroxine (SYNTHROID, LEVOTHROID) 100 MCG tablet Take 100 mcg by mouth daily before breakfast.  . mirabegron ER (MYRBETRIQ) 50 MG TB24 tablet Take 50 mg by mouth daily.  . Multiple Vitamins-Minerals (MULTIVITAMIN WITH MINERALS) tablet Take 1 tablet by mouth daily.    . nitroGLYCERIN (NITROSTAT) 0.4 MG SL tablet Place 0.4 mg under the tongue every 5 (five) minutes as needed for chest pain.  Marland Kitchen nystatin cream (MYCOSTATIN) Apply 1 application topically daily as needed for dry skin (irritation. boils on buttocks). Use  as needed  . pantoprazole (PROTONIX) 40 MG tablet Take 1 tablet by mouth daily. Take 1 tab daily  . telmisartan (MICARDIS) 80 MG tablet Take 1 tablet by mouth daily. Take 1 tab daily  . tiZANidine (ZANAFLEX) 4 MG tablet Take 4 mg by mouth 5 (five) times daily as needed for muscle spasms. PAIN  . traMADol (ULTRAM) 50 MG tablet Take 1 tablet (50 mg total) by mouth every 6 (six) hours as needed.  . vitamin E (VITAMIN E) 400 UNIT capsule Take 400 Units by mouth daily.  Marland Kitchen zolpidem (AMBIEN) 5 MG tablet Take 5 mg by mouth at bedtime as needed for sleep.     Allergies:   Ciprofloxacin, Carisoprodol-aspirin, Penicillins, Sulfa antibiotics, Acetaminophen, Aspirin, Macrodantin [nitrofurantoin macrocrystal], Nsaids, Percocet [oxycodone-acetaminophen], Propoxyphene, Propoxyphene n-acetaminophen, Soma compound [carisoprodol-aspirin], and Tolectin [tolmetin]   Social History   Tobacco Use  . Smoking status: Never Smoker  . Smokeless tobacco: Never Used  Substance Use Topics  . Alcohol use: No  . Drug use: No     Family Hx: The patient's family history includes Allergies in her grandchild; Cancer in her father; Diabetes in her father; Heart failure in her father and mother; Parkinson's disease in her mother.  ROS:   Please see the history of present illness.    All other systems reviewed and are negative.    Prior CV studies:   The following studies were reviewed today: TTE 02/12/2010:EF 55-60%. No wall motion abnormalities. Grade 1 diastolic dysfunction. Calcification of anterior mitral chordae. Mildly elevated PASP.  Lexiscan Myoview4/3/18: The left ventricular ejection fraction is hyperdynamic (>65%).  Nuclear stress EF: 67%.  There was no ST segment deviation noted during stress.  Defect 1: There is a small defect of mild severity present in the basal inferior and mid inferior location.  Findings consistent with ischemia.  This is a low risk study.  Cardiac CT-A 08/29/16:  IMPRESSION: 1) Left dominant coronary arteries with non obstructive disease in proximal and mid LAD  2) Calcium score 56 which is 59th percentile for age and sex  3) Normal aortic root 31 mm   Labs/Other Tests and Data Reviewed:    EKG:  No ECG reviewed.  Recent Labs: No results found for requested labs within last 8760 hours.   Recent Lipid Panel No results found for: CHOL, TRIG, HDL, CHOLHDL, LDLCALC, LDLDIRECT  Wt Readings from Last 3 Encounters:  11/04/18 185 lb (83.9 kg)  10/01/18 185 lb (83.9 kg)  05/23/18 170 lb (77.1 kg)     Objective:    Vital Signs:  BP (!) 142/72   Ht 5' (1.524 m)   Wt 185 lb (83.9 kg)   BMI 36.13 kg/m    VITAL SIGNS:  reviewed GEN:  no acute distress NEURO:  alert and oriented x 3, no obvious focal deficit PSYCH:  normal affect  ASSESSMENT & PLAN:    1. Hypertension: She was able to get her husband to take her BP while I waited on the phone. He was not happy about doing it and they argued a bit. BP was slightly elevated. I went over each of her medications with her and explained what they were for. She is to continue to take the hydralazine BID, Coreg BID, and telmisartan in the afternoon to avoid cummultove effect of taking all of her medications together in the am.   2. Hypercholesterolemia:  She is to take atorvastatin at HS. Needs follow up labs  in 3 months.   3. Anxiety:  May be contributing to her BP control. Continue medications for anxiety as directed.  COVID-19 Education: The signs and symptoms of COVID-19 were discussed with the patient and how to seek care for testing (follow up with PCP or arrange E-visit).  The importance of social distancing was discussed today.  Time:   Today, I have spent 25 minutes with the patient with telehealth technology discussing the above problems.     Medication Adjustments/Labs and Tests Ordered: Current medicines are reviewed at length with the patient today.  Concerns regarding medicines are outlined above.   Tests Ordered: No orders of the defined types were placed in this encounter.   Medication Changes: No orders of the defined types were placed in this encounter.   Disposition:  Follow up 3 months.   Signed, Phill Myron. West Pugh, ANP, AACC  11/04/2018 5:02 PM    [facility]

## 2018-11-04 ENCOUNTER — Telehealth (INDEPENDENT_AMBULATORY_CARE_PROVIDER_SITE_OTHER): Payer: Medicare Other | Admitting: Adult Health

## 2018-11-04 ENCOUNTER — Encounter: Payer: Self-pay | Admitting: Adult Health

## 2018-11-04 VITALS — BP 142/72 | Ht 60.0 in | Wt 185.0 lb

## 2018-11-04 DIAGNOSIS — I10 Essential (primary) hypertension: Secondary | ICD-10-CM

## 2018-11-04 DIAGNOSIS — E7800 Pure hypercholesterolemia, unspecified: Secondary | ICD-10-CM

## 2018-11-04 DIAGNOSIS — I2510 Atherosclerotic heart disease of native coronary artery without angina pectoris: Secondary | ICD-10-CM

## 2018-11-04 NOTE — Patient Instructions (Addendum)
Medication Instructions:  TAKE TELMISARTAN AT 2-3PM TAKE ATORVASTATIN AT BEDTIME CONTINUE COREG AND HYDRALAZINE TWICE DAILY If you need a refill on your cardiac medications before your next appointment, please call your pharmacy.  Follow-Up: You will need a follow up appointment in [place], [place] ONLY, Jory Sims, [place], [place]  or one of the following Advanced Practice Providers on your designated Care Team:  Kerin Ransom, PA-C  Roby Lofts, PA-C Sande Rives, [place]      At [facility], you and your health needs are our priority.  As part of our continuing mission to provide you with exceptional heart care, we have created designated Provider Care Teams.  These Care Teams include your primary Cardiologist (physician) and Advanced Practice Providers (APPs -  Physician Assistants and Nurse Practitioners) who all work together to provide you with the care you need, when you need it.  Thank you for choosing [facility] at [facility]!!

## 2018-11-05 DIAGNOSIS — E119 Type 2 diabetes mellitus without complications: Secondary | ICD-10-CM | POA: Diagnosis not present

## 2018-11-05 DIAGNOSIS — R109 Unspecified abdominal pain: Secondary | ICD-10-CM | POA: Diagnosis not present

## 2018-11-05 DIAGNOSIS — R1030 Lower abdominal pain, unspecified: Secondary | ICD-10-CM | POA: Diagnosis not present

## 2018-11-06 DIAGNOSIS — H25812 Combined forms of age-related cataract, left eye: Secondary | ICD-10-CM | POA: Diagnosis not present

## 2018-11-06 DIAGNOSIS — H25042 Posterior subcapsular polar age-related cataract, left eye: Secondary | ICD-10-CM | POA: Diagnosis not present

## 2018-11-06 DIAGNOSIS — H2512 Age-related nuclear cataract, left eye: Secondary | ICD-10-CM | POA: Diagnosis not present

## 2018-11-06 DIAGNOSIS — H25012 Cortical age-related cataract, left eye: Secondary | ICD-10-CM | POA: Diagnosis not present

## 2018-11-06 DIAGNOSIS — H52222 Regular astigmatism, left eye: Secondary | ICD-10-CM | POA: Diagnosis not present

## 2018-11-12 NOTE — Telephone Encounter (Signed)
Opened in error

## 2018-11-25 DIAGNOSIS — E039 Hypothyroidism, unspecified: Secondary | ICD-10-CM | POA: Diagnosis not present

## 2018-11-25 DIAGNOSIS — Z23 Encounter for immunization: Secondary | ICD-10-CM | POA: Diagnosis not present

## 2018-11-25 DIAGNOSIS — I10 Essential (primary) hypertension: Secondary | ICD-10-CM | POA: Diagnosis not present

## 2018-11-25 DIAGNOSIS — E559 Vitamin D deficiency, unspecified: Secondary | ICD-10-CM | POA: Diagnosis not present

## 2018-11-25 DIAGNOSIS — E785 Hyperlipidemia, unspecified: Secondary | ICD-10-CM | POA: Diagnosis not present

## 2018-11-25 DIAGNOSIS — E119 Type 2 diabetes mellitus without complications: Secondary | ICD-10-CM | POA: Diagnosis not present

## 2018-12-03 DIAGNOSIS — M25511 Pain in right shoulder: Secondary | ICD-10-CM | POA: Diagnosis not present

## 2018-12-03 DIAGNOSIS — M25572 Pain in left ankle and joints of left foot: Secondary | ICD-10-CM | POA: Diagnosis not present

## 2018-12-05 DIAGNOSIS — E119 Type 2 diabetes mellitus without complications: Secondary | ICD-10-CM | POA: Diagnosis not present

## 2018-12-05 DIAGNOSIS — R1030 Lower abdominal pain, unspecified: Secondary | ICD-10-CM | POA: Diagnosis not present

## 2018-12-05 DIAGNOSIS — R109 Unspecified abdominal pain: Secondary | ICD-10-CM | POA: Diagnosis not present

## 2018-12-12 ENCOUNTER — Other Ambulatory Visit: Payer: Self-pay | Admitting: Cardiovascular Disease

## 2018-12-24 DIAGNOSIS — E119 Type 2 diabetes mellitus without complications: Secondary | ICD-10-CM | POA: Diagnosis not present

## 2018-12-24 DIAGNOSIS — E785 Hyperlipidemia, unspecified: Secondary | ICD-10-CM | POA: Diagnosis not present

## 2018-12-24 DIAGNOSIS — E871 Hypo-osmolality and hyponatremia: Secondary | ICD-10-CM | POA: Diagnosis not present

## 2018-12-24 DIAGNOSIS — E7800 Pure hypercholesterolemia, unspecified: Secondary | ICD-10-CM | POA: Diagnosis not present

## 2018-12-24 DIAGNOSIS — I10 Essential (primary) hypertension: Secondary | ICD-10-CM | POA: Diagnosis not present

## 2018-12-31 ENCOUNTER — Other Ambulatory Visit: Payer: Self-pay

## 2018-12-31 ENCOUNTER — Ambulatory Visit (INDEPENDENT_AMBULATORY_CARE_PROVIDER_SITE_OTHER): Payer: Medicare Other | Admitting: General Practice

## 2018-12-31 ENCOUNTER — Encounter: Payer: Self-pay | Admitting: Adult Health

## 2018-12-31 VITALS — BP 127/75 | HR 72 | Ht 60.0 in | Wt 185.0 lb

## 2018-12-31 DIAGNOSIS — E119 Type 2 diabetes mellitus without complications: Secondary | ICD-10-CM | POA: Diagnosis not present

## 2018-12-31 DIAGNOSIS — E871 Hypo-osmolality and hyponatremia: Secondary | ICD-10-CM | POA: Diagnosis not present

## 2018-12-31 DIAGNOSIS — E876 Hypokalemia: Secondary | ICD-10-CM | POA: Diagnosis not present

## 2018-12-31 DIAGNOSIS — I10 Essential (primary) hypertension: Secondary | ICD-10-CM

## 2018-12-31 DIAGNOSIS — F419 Anxiety disorder, unspecified: Secondary | ICD-10-CM | POA: Diagnosis not present

## 2018-12-31 DIAGNOSIS — E7800 Pure hypercholesterolemia, unspecified: Secondary | ICD-10-CM | POA: Diagnosis not present

## 2018-12-31 DIAGNOSIS — I5181 Takotsubo syndrome: Secondary | ICD-10-CM | POA: Diagnosis not present

## 2018-12-31 NOTE — Patient Instructions (Addendum)
Medication Instructions:  Your physician recommends that you continue on your current medications as directed. Please refer to the Current Medication list given to you today.  If you need a refill on your cardiac medications before your next appointment, please call your pharmacy.   Lab work: NONE ordered this time of appointment   If you have labs (blood work) drawn today and your tests are completely normal, you will receive your results only by: Marland Kitchen MyChart Message (if you have MyChart) OR . A paper copy in the mail If you have any lab test that is abnormal or we need to change your treatment, we will call you to review the results.  Testing/Procedures: NONE ordered at this time of appointment  Follow-Up: At [facility], you and your health needs are our priority.  As part of our continuing mission to provide you with exceptional heart care, we have created designated Provider Care Teams.  These Care Teams include your primary Cardiologist (physician) and Advanced Practice Providers (APPs -  Physician Assistants and Nurse Practitioners) who all work together to provide you with the care you need, when you need it. You will need a follow up appointment in 6 months (February 2021).  Please call our office 2 months in December 2020 to schedule this appointment.  You may see Skeet Latch, MD or one of the following Advanced Practice Providers on your designated Care Team:   Kerin Ransom, PA-C Roby Lofts, [place] . Sande Rives, PA-C  Any Other Special Instructions Will Be Listed Below (If Applicable).

## 2018-12-31 NOTE — Progress Notes (Signed)
Cardiology Clinic Note   Patient Name: [name] Date of Encounter: 12/31/2018  Primary Care Provider:  Deland Pretty, MD Primary Cardiologist:  Skeet Latch, MD  Patient Profile    [name] 73 year old female presents today for follow-up of her Takutsubo cardiomyopathy, mild nonobstructive coronary artery disease, hypertension, type 2 diabetes and hypothyroidism.  Past Medical History    Past Medical History:  Diagnosis Date  . Angina pectoris ([place]) 08/02/2016  . Anxiety   . Arthritis    "hands, knees, back" (07/18/2013)  . Asthma   . CHF (congestive heart failure) ([place])   . Chronic back pain   . Chronic lower back pain   . Family history of anesthesia complication    Mother and Sister- N/V  . GERD (gastroesophageal reflux disease)   . H/O hiatal hernia   . UMPNTIRW(431.5)    "weekly" (07/18/2013)  . Hyperlipemia   . Hypertension   . Hypothyroidism   . Insomnia   . Kidney stones   . Mental disorder   . Migraine    "years ago" (07/18/2013)  . Osteopenia   . Post-polio syndrome   . Post-polio syndrome   . Sleep apnea   . Takotsubo cardiomyopathy   . Type II diabetes mellitus ([place])    Past Surgical History:  Procedure Laterality Date  . ABDOMINAL HYSTERECTOMY    . APPENDECTOMY    . CARPAL TUNNEL RELEASE Bilateral   . CHOLECYSTECTOMY    . COLONOSCOPY    . CYSTOSCOPY    . KNEE ARTHROSCOPY Left   . KNEE SURGERY Left   . LEG SURGERY Left    polio  . ORIF PATELLA Left 07/18/2013   Procedure: OPEN REDUCTION INTERNAL (ORIF) LEFT FIXATION PATELLA;  Surgeon: Augustin Schooling, MD;  Location: [facility];  Service: Orthopedics;  Laterality: Left;  . ORIF PATELLA FRACTURE Left 07/18/2013  . TUBAL LIGATION    . WISDOM TOOTH EXTRACTION    . WRIST TENDON TRANSFER Right     Allergies  Allergies  Allergen Reactions  . Ciprofloxacin Rash  . Carisoprodol-Aspirin   . Penicillins Itching and Other (See Comments)    Unknown Has patient had a PCN reaction causing immediate  rash, facial/tongue/throat swelling, SOB or lightheadedness with hypotension: no Has patient had a PCN reaction causing severe rash involving mucus membranes or skin necrosis: unknown Has patient had a PCN reaction that required hospitalization : dr's office Has patient had a PCN reaction occurring within the last 10 years: no If all of the above answers are "NO", then may proceed with Cephalosporin use.   . Sulfa Antibiotics Other (See Comments)    unknown Unknown.  States as always told had an allergy  . Acetaminophen Itching  . Aspirin Other (See Comments)    Chest pain, itching Chest pain, itching  . Macrodantin [Nitrofurantoin Macrocrystal] Rash  . Nsaids Other (See Comments)    Chest pain and stomach pain  . Percocet [Oxycodone-Acetaminophen] Itching  . Propoxyphene     Chest/stomach pain  . Propoxyphene N-Acetaminophen Other (See Comments)    Chest/stomach pain  . Soma Compound [Carisoprodol-Aspirin] Other (See Comments)    "cant judge distance"  . Tolectin [Tolmetin] Itching    History of Present Illness    [name] was last seen by Jory Sims DNP, on 11/04/2018.  During that time she had multiple noncardiac related complaints.  Stating she was having trouble with her eyesight, had not been taking her home blood pressure medication, and was requiring help from  her husband.  She has a history of atypical chest pain and chronic lower extremity edema.  She has been taking Lasix 3 times a week.  In 2016 a Myoview stress test showed no evidence of ischemia.  She was also seen in the emergency department in 2018 with uncontrolled hypertension.  A CTA revealed 30% LAD and no other obstructive disease.  Her metoprolol has been switched to carvedilol due to her hypertension and low heart rates.  Amlodipine has been discontinued in the setting of lower extremity edema which did not improve after stopping the medication.  Furosemide was switched to HCTZ.  Amlodipine has since been  added back to her medication regimen by her PCP for better blood pressure control.  When she was seen by Dr. Oval Linsey 10/01/2018 her blood pressure was poorly controlled and she was also found to have had increased lower extremity edema with the addition of amlodipine.  Her telmisartan and carvedilol were continued.  Her amlodipine was discontinued and she was again started back on hydrochlorothiazide 50 mg twice daily.  Her atypical chest pain was felt to be related to stress and anxiety.  However, she did have a 30% LAD lesion.  Her aspirin and atorvastatin were continued with a goal LDL of less than 60.  Her labs are managed by her PCP.  She presents to the clinic today and states she has been taking her medications as prescribed.  She was able to have the pharmacy put her medications in blister packs for easier distribution.  She has been followed closely by Dr. Pennie Banter office and brought her labs today.  She has noticed 1 episode of chest discomfort in the last 2 months while she was at rest and states that she took a nitroglycerin which relieved the discomfort.  She states she is not physically active due to her previous polio infection but plans to try to be more physically active in the future.  She denies chest pain, shortness of breath, lower extremity edema, fatigue, palpitations, melena, hematuria, hemoptysis, diaphoresis, weakness, presyncope, syncope, orthopnea, and PND.   Home Medications    Prior to Admission medications   Medication Sig Start Date End Date Taking? Authorizing Provider  aspirin 81 MG tablet Take 81 mg by mouth at bedtime.     [provider]  atorvastatin (LIPITOR) 40 MG tablet Take 40 mg by mouth daily.    [provider]  b complex vitamins tablet Take 1 tablet by mouth daily.    [provider]  benzonatate (TESSALON) 100 MG capsule Take 100 mg by mouth 3 (three) times daily as needed for cough.    [provider]  busPIRone  (BUSPAR) 7.5 MG tablet Take 7.5 mg by mouth daily.    [provider]  Calcium Carbonate-Vitamin D (CALTRATE 600+D) 600-400 MG-UNIT per chew tablet Chew 1 tablet by mouth 2 (two) times daily.     [provider]  carvedilol (COREG) 12.5 MG tablet TAKE 1 TABLET BY MOUTH TWICE DAILY. 12/12/18   Skeet Latch, MD  clonazePAM (KLONOPIN) 0.5 MG tablet Take 0.5 mg by mouth 2 (two) times daily.     [provider]  fish oil-omega-3 fatty acids 1000 MG capsule Take 2 g by mouth 2 (two) times daily.      [provider]  glimepiride (AMARYL) 1 MG tablet Take 1 mg by mouth daily with breakfast.    [provider]  hydrALAZINE (APRESOLINE) 50 MG tablet Take 1 tablet (50 mg total)  by mouth 2 (two) times a day. 10/01/18 12/30/18  Skeet Latch, MD  hydrochlorothiazide (HYDRODIURIL) 25 MG tablet TAKE 1 TABLET ONCE DAILY. 06/28/18   Skeet Latch, MD  HYDROXYZINE PAMOATE PO Take 25 mg by mouth.    [provider]  levothyroxine (SYNTHROID, LEVOTHROID) 100 MCG tablet Take 100 mcg by mouth daily before breakfast.    [provider]  mirabegron ER (MYRBETRIQ) 50 MG TB24 tablet Take 50 mg by mouth daily.    [provider]  Multiple Vitamins-Minerals (MULTIVITAMIN WITH MINERALS) tablet Take 1 tablet by mouth daily.      [provider]  nitroGLYCERIN (NITROSTAT) 0.4 MG SL tablet Place 0.4 mg under the tongue every 5 (five) minutes as needed for chest pain.    [provider]  nystatin cream (MYCOSTATIN) Apply 1 application topically daily as needed for dry skin (irritation. boils on buttocks). Use as needed 09/29/14   [provider]  pantoprazole (PROTONIX) 40 MG tablet Take 1 tablet by mouth daily. Take 1 tab daily 12/21/14   [provider]  telmisartan (MICARDIS) 80 MG tablet Take 1 tablet by mouth daily. Take 1 tab daily 12/14/14   [provider]  tiZANidine (ZANAFLEX) 4 MG tablet Take 4 mg by  mouth 5 (five) times daily as needed for muscle spasms. PAIN    [provider]  traMADol (ULTRAM) 50 MG tablet Take 1 tablet (50 mg total) by mouth every 6 (six) hours as needed. 09/24/17   Davonna Belling, MD  vitamin E (VITAMIN E) 400 UNIT capsule Take 400 Units by mouth daily.    [provider]  zolpidem (AMBIEN) 5 MG tablet Take 5 mg by mouth at bedtime as needed for sleep.    [provider]    Family History    Family History  Problem Relation Age of Onset  . Heart failure Mother   . Parkinson's disease Mother   . Diabetes Father   . Cancer Father   . Heart failure Father   . Allergies Grandchild    She indicated that her mother is deceased. She indicated that her father is deceased. She indicated that her sister is alive. She indicated that her brother is alive. She indicated that her maternal grandmother is deceased. She indicated that her maternal grandfather is deceased. She indicated that her paternal grandmother is deceased. She indicated that her paternal grandfather is deceased. She indicated that all of her three daughters are alive. She indicated that the status of her grandchild is unknown.  Social History    Social History   Socioeconomic History  . Marital status: Married    Spouse name: Not on file  . Number of children: Not on file  . Years of education: Not on file  . Highest education level: Not on file  Occupational History  . Occupation: Retired   Scientific laboratory technician  . Financial resource strain: Not on file  . Food insecurity    Worry: Not on file    Inability: Not on file  . Transportation needs    Medical: Not on file    Non-medical: Not on file  Tobacco Use  . Smoking status: Never Smoker  . Smokeless tobacco: Never Used  Substance and Sexual Activity  . Alcohol use: No  . Drug use: No  . Sexual activity: Never  Lifestyle  . Physical activity    Days per week: Not on file    Minutes per session: Not on file  .  Stress: Not on file  Relationships  . Social Herbalist on phone: Not on file    Gets together: Not on file    Attends religious service: Not on file    Active member of club or organization: Not on file    Attends meetings of clubs or organizations: Not on file    Relationship status: Not on file  . Intimate partner violence    Fear of current or ex partner: Not on file    Emotionally abused: Not on file    Physically abused: Not on file    Forced sexual activity: Not on file  Other Topics Concern  . Not on file  Social History Narrative  . Not on file     Review of Systems    General:  No chills, fever, night sweats or weight changes.  Cardiovascular:  No chest pain, dyspnea on exertion, edema, orthopnea, palpitations, paroxysmal nocturnal dyspnea. Dermatological: No rash, lesions/masses Respiratory: No cough, dyspnea Urologic: No hematuria, dysuria Abdominal:   No nausea, vomiting, diarrhea, bright red blood per rectum, melena, or hematemesis Neurologic:  No visual changes, wkns, changes in mental status. All other systems reviewed and are otherwise negative except as noted above.  Physical Exam    VS:  BP 127/75   Pulse 72  , BMI There is no height or weight on file to calculate BMI. GEN: Well nourished, well developed, in no acute distress. HEENT: normal. Neck: Supple, no JVD, carotid bruits, or masses. Cardiac: RRR, no murmurs, rubs, or gallops. No clubbing, cyanosis, slight right lower extremity nonpitting edema.  Radials/DP/PT 2+ and equal bilaterally.  Respiratory:  Respirations regular and unlabored, clear to auscultation bilaterally. GI: Soft, nontender, nondistended, BS + x 4. MS: no deformity or atrophy. Skin: warm and dry, no rash. Neuro:  Strength and sensation are intact. Psych: Normal affect.  Accessory Clinical Findings    ECG personally reviewed by me today- None today   EKG 01/25/2018 Sinus bradycardia 55 bpm  EKG 08/15/2017 Sinus  bradycardia 58 bpm  EKG 01/18/2017 Sinus bradycardia 59 bpm  Myocardial perfusion study 08/15/2016  The left ventricular ejection fraction is hyperdynamic (>65%).  Nuclear stress EF: 67%.  There was no ST segment deviation noted during stress.  Defect 1: There is a small defect of mild severity present in the basal inferior and mid inferior location.  Findings consistent with ischemia.  This is a low risk study.  Echocardiogram 01/31/2010 Study Conclusions   - Left ventricle: The cavity size was normal. Wall thickness was   normal. Systolic function was normal. The estimated ejection   fraction was in the range of 55% to 60%. Wall motion was normal;   there were no regional wall motion abnormalities. Doppler   parameters are consistent with abnormal left ventricular   relaxation (grade 1 diastolic dysfunction).  - Mitral valve: Calcification of the anterior mitral chordae.  - Pulmonary arteries: Systolic pressure was mildly increased.  Assessment & Plan    1.  Hypertension-well-controlled today 127/75 today Continue HCTZ 25 mg tablet daily Continue hydralazine 50 mg tablet twice daily Continue carvedilol 12.5 mg tablet twice daily Low-sodium heart healthy diet Increase physical activity as tolerated goal 150 minutes/day of moderate physical activity  2.  Hypercholesterolemia- LDL 26 12/06/2017 continue atorvastatin 40 mg tablet daily Continue omega-3 fatty acids 2 g twice daily Monitored by PCP  3.Takotsubo syndrome-myocardial perfusion study 4/18, EF 67% Continue HCTZ 25 mg tablet daily Continue hydralazine 50 mg tablet  twice daily Continue carvedilol 12.5 mg tablet twice daily Low-sodium heart healthy diet Increase physical activity as tolerated goal 150 minutes/day of moderate physical activity  4.  Anxiety-normal affect today Continue Klonopin 0.5 mg tablet twice daily Continue hydroxyzine 25 mg tablet daily Stress and anxiety reduction-instructions  provided Monitored by PCP  She has brought her lab work today from Dr. Pennie Banter office which will be scanned in.  Disposition: Follow-up with Dr. Oval Linsey in 6 months.  Deberah Pelton, NP 12/31/2018, 1:10 PM

## 2019-01-01 DIAGNOSIS — I10 Essential (primary) hypertension: Secondary | ICD-10-CM | POA: Diagnosis not present

## 2019-01-01 DIAGNOSIS — E871 Hypo-osmolality and hyponatremia: Secondary | ICD-10-CM | POA: Diagnosis not present

## 2019-01-13 ENCOUNTER — Ambulatory Visit: Payer: Medicare Other | Admitting: Adult Health

## 2019-01-23 DIAGNOSIS — E871 Hypo-osmolality and hyponatremia: Secondary | ICD-10-CM | POA: Diagnosis not present

## 2019-01-23 DIAGNOSIS — E7800 Pure hypercholesterolemia, unspecified: Secondary | ICD-10-CM | POA: Diagnosis not present

## 2019-01-23 DIAGNOSIS — I10 Essential (primary) hypertension: Secondary | ICD-10-CM | POA: Diagnosis not present

## 2019-01-23 DIAGNOSIS — E119 Type 2 diabetes mellitus without complications: Secondary | ICD-10-CM | POA: Diagnosis not present

## 2019-01-27 DIAGNOSIS — H35033 Hypertensive retinopathy, bilateral: Secondary | ICD-10-CM | POA: Diagnosis not present

## 2019-01-27 DIAGNOSIS — D485 Neoplasm of uncertain behavior of skin: Secondary | ICD-10-CM | POA: Diagnosis not present

## 2019-01-27 DIAGNOSIS — B079 Viral wart, unspecified: Secondary | ICD-10-CM | POA: Diagnosis not present

## 2019-01-27 DIAGNOSIS — L820 Inflamed seborrheic keratosis: Secondary | ICD-10-CM | POA: Diagnosis not present

## 2019-02-05 DIAGNOSIS — Z1159 Encounter for screening for other viral diseases: Secondary | ICD-10-CM | POA: Diagnosis not present

## 2019-02-05 DIAGNOSIS — E7800 Pure hypercholesterolemia, unspecified: Secondary | ICD-10-CM | POA: Diagnosis not present

## 2019-02-05 DIAGNOSIS — E871 Hypo-osmolality and hyponatremia: Secondary | ICD-10-CM | POA: Diagnosis not present

## 2019-02-05 DIAGNOSIS — Z23 Encounter for immunization: Secondary | ICD-10-CM | POA: Diagnosis not present

## 2019-02-05 DIAGNOSIS — E119 Type 2 diabetes mellitus without complications: Secondary | ICD-10-CM | POA: Diagnosis not present

## 2019-02-05 DIAGNOSIS — I10 Essential (primary) hypertension: Secondary | ICD-10-CM | POA: Diagnosis not present

## 2019-02-17 DIAGNOSIS — Z86010 Personal history of colonic polyps: Secondary | ICD-10-CM | POA: Diagnosis not present

## 2019-02-17 DIAGNOSIS — R1084 Generalized abdominal pain: Secondary | ICD-10-CM | POA: Diagnosis not present

## 2019-02-17 DIAGNOSIS — K219 Gastro-esophageal reflux disease without esophagitis: Secondary | ICD-10-CM | POA: Diagnosis not present

## 2019-02-17 DIAGNOSIS — Z993 Dependence on wheelchair: Secondary | ICD-10-CM | POA: Diagnosis not present

## 2019-02-19 DIAGNOSIS — I10 Essential (primary) hypertension: Secondary | ICD-10-CM | POA: Diagnosis not present

## 2019-02-19 DIAGNOSIS — E871 Hypo-osmolality and hyponatremia: Secondary | ICD-10-CM | POA: Diagnosis not present

## 2019-03-19 DIAGNOSIS — I10 Essential (primary) hypertension: Secondary | ICD-10-CM | POA: Diagnosis not present

## 2019-03-19 DIAGNOSIS — E7800 Pure hypercholesterolemia, unspecified: Secondary | ICD-10-CM | POA: Diagnosis not present

## 2019-03-19 DIAGNOSIS — E039 Hypothyroidism, unspecified: Secondary | ICD-10-CM | POA: Diagnosis not present

## 2019-03-19 DIAGNOSIS — E119 Type 2 diabetes mellitus without complications: Secondary | ICD-10-CM | POA: Diagnosis not present

## 2019-03-19 DIAGNOSIS — E559 Vitamin D deficiency, unspecified: Secondary | ICD-10-CM | POA: Diagnosis not present

## 2019-03-19 DIAGNOSIS — R5383 Other fatigue: Secondary | ICD-10-CM | POA: Diagnosis not present

## 2019-04-14 DIAGNOSIS — N261 Atrophy of kidney (terminal): Secondary | ICD-10-CM | POA: Diagnosis not present

## 2019-04-14 DIAGNOSIS — G8220 Paraplegia, unspecified: Secondary | ICD-10-CM | POA: Diagnosis not present

## 2019-04-14 DIAGNOSIS — N3020 Other chronic cystitis without hematuria: Secondary | ICD-10-CM | POA: Diagnosis not present

## 2019-04-14 DIAGNOSIS — N319 Neuromuscular dysfunction of bladder, unspecified: Secondary | ICD-10-CM | POA: Diagnosis not present

## 2019-04-16 DIAGNOSIS — I10 Essential (primary) hypertension: Secondary | ICD-10-CM | POA: Diagnosis not present

## 2019-04-17 DIAGNOSIS — M6281 Muscle weakness (generalized): Secondary | ICD-10-CM | POA: Diagnosis not present

## 2019-04-24 DIAGNOSIS — G4733 Obstructive sleep apnea (adult) (pediatric): Secondary | ICD-10-CM | POA: Diagnosis not present

## 2019-04-24 DIAGNOSIS — I10 Essential (primary) hypertension: Secondary | ICD-10-CM | POA: Diagnosis not present

## 2019-05-20 DIAGNOSIS — D044 Carcinoma in situ of skin of scalp and neck: Secondary | ICD-10-CM | POA: Diagnosis not present

## 2019-05-20 DIAGNOSIS — R490 Dysphonia: Secondary | ICD-10-CM | POA: Diagnosis not present

## 2019-05-27 DIAGNOSIS — M25511 Pain in right shoulder: Secondary | ICD-10-CM | POA: Diagnosis not present

## 2019-05-27 DIAGNOSIS — M545 Low back pain: Secondary | ICD-10-CM | POA: Diagnosis not present

## 2019-05-29 ENCOUNTER — Other Ambulatory Visit: Payer: Self-pay | Admitting: Obstetrics and Gynecology

## 2019-05-29 DIAGNOSIS — Z1231 Encounter for screening mammogram for malignant neoplasm of breast: Secondary | ICD-10-CM

## 2019-06-16 DIAGNOSIS — M6281 Muscle weakness (generalized): Secondary | ICD-10-CM | POA: Diagnosis not present

## 2019-06-18 DIAGNOSIS — R490 Dysphonia: Secondary | ICD-10-CM | POA: Diagnosis not present

## 2019-06-18 DIAGNOSIS — K219 Gastro-esophageal reflux disease without esophagitis: Secondary | ICD-10-CM | POA: Diagnosis not present

## 2019-06-18 DIAGNOSIS — H6123 Impacted cerumen, bilateral: Secondary | ICD-10-CM | POA: Diagnosis not present

## 2019-06-26 DIAGNOSIS — Z0000 Encounter for general adult medical examination without abnormal findings: Secondary | ICD-10-CM | POA: Diagnosis not present

## 2019-06-26 DIAGNOSIS — E785 Hyperlipidemia, unspecified: Secondary | ICD-10-CM | POA: Diagnosis not present

## 2019-06-26 DIAGNOSIS — E119 Type 2 diabetes mellitus without complications: Secondary | ICD-10-CM | POA: Diagnosis not present

## 2019-06-26 DIAGNOSIS — E871 Hypo-osmolality and hyponatremia: Secondary | ICD-10-CM | POA: Diagnosis not present

## 2019-06-26 DIAGNOSIS — K219 Gastro-esophageal reflux disease without esophagitis: Secondary | ICD-10-CM | POA: Diagnosis not present

## 2019-06-26 DIAGNOSIS — I10 Essential (primary) hypertension: Secondary | ICD-10-CM | POA: Diagnosis not present

## 2019-07-09 ENCOUNTER — Ambulatory Visit: Payer: Medicare Other | Admitting: Cardiovascular Disease

## 2019-07-09 ENCOUNTER — Encounter: Payer: Self-pay | Admitting: Cardiovascular Disease

## 2019-07-09 ENCOUNTER — Other Ambulatory Visit: Payer: Self-pay

## 2019-07-09 VITALS — BP 111/58 | HR 61 | Ht 60.0 in

## 2019-07-09 DIAGNOSIS — I5181 Takotsubo syndrome: Secondary | ICD-10-CM

## 2019-07-09 DIAGNOSIS — I10 Essential (primary) hypertension: Secondary | ICD-10-CM | POA: Diagnosis not present

## 2019-07-09 DIAGNOSIS — I480 Paroxysmal atrial fibrillation: Secondary | ICD-10-CM | POA: Diagnosis not present

## 2019-07-09 MED ORDER — AMLODIPINE BESYLATE 5 MG PO TABS: ORAL_TABLET | ORAL | 3 refills | Status: DC

## 2019-07-09 NOTE — Patient Instructions (Addendum)
Medication Instructions:  DECREASE YOUR AMLODIPINE TO 5 MG ONCE DAILY IN THE EVENING STARTING TOMORROW, DO NOT TAKE TONIGHT  THIS IS THE PILL THAT IS WHITE ROUND WITH THE NUMBER 210 ON IT   *If you need a refill on your cardiac medications before your next appointment, please call your pharmacy*  Lab Work:  If you have labs (blood work) drawn today and your tests are completely normal, you will receive your results only by: Marland Kitchen MyChart Message (if you have MyChart) OR . A paper copy in the mail If you have any lab test that is abnormal or we need to change your treatment, we will call you to review the results.  Testing/Procedures: NONE   Follow-Up: At [facility], you and your health needs are our priority.  As part of our continuing mission to provide you with exceptional heart care, we have created designated Provider Care Teams.  These Care Teams include your primary Cardiologist (physician) and Advanced Practice Providers (APPs -  Physician Assistants and Nurse Practitioners) who all work together to provide you with the care you need, when you need it.  Your next appointment:   2 month(s)  The format for your next appointment:   In Person  Provider:   You may see Skeet Latch, MD or one of the following Advanced Practice Providers on your designated Care Team:    Kerin Ransom, PA-C  [place], [place]  Coletta Memos, [place]  Other Instructions  MONITOR AND LOG YOUR BLOOD PRESSURE AT HOME, [place]

## 2019-07-09 NOTE — Progress Notes (Signed)
Cardiology Office Note   Date:  07/19/2019   ID:  [name], DOB Jul 02, 1945, MRN [id number]  PCP:  Deland Pretty, MD  Cardiologist:   Skeet Latch, MD   No chief complaint on file.    History of Present Illness: [name] is a 74 y.o. female with  HTN, mild-non-obstructive CAD, Takotsubo syndrome, DM, post-Polio syndrome, and hypothyroidism who presents for follow up. [name] was first seen 12/2014 for hypertension and atypical chest pain. At that appointment she had lower extremity edema. Furosemide 3 times weekly was added to her regimen. She also reported atypical chest pain and was referred for [facility] 12/31/14 that revealed LVEF 71% with no evidence of ischemia was seen in the ED 07/30/16. [name] was seen in the ED 07/2016 with chest tightness and poorly controlled blood pressure. Cardiac enzymes were negative. Her EKG showed nonspecific ST-T changes. She was instructed to follow-up with cardiology as an outpatient. She had a Lexiscan Myoview 08/15/16 that revealed LVEF 67% with concern for basal to mid inferior ischemia. She subsequently had a cardiac CT-A that revealed 30% LAD stenosis and no obstructive disease.   Metoprolol was switched to carvedilol due to high blood pressures and low heart rates. Amlodipine was discontinued due to lower extremity edema, which improved after making that change. Her BP was elevated so furosemide was switched to HCTZ. This was subsequently held by her PCP due to hyponatremia but later resumed once her sodium improved.  Amlodipine was added back to her regimen and her blood pressure remains poorly-controlled.  Lately she has felt well.  However her blood pressure has been getting low at times.  She does not get any exercise due to neuropathy.  Past Medical History:  Diagnosis Date  . Angina pectoris ([place]) 08/02/2016  . Anxiety   . Arthritis    "hands, knees, back" (07/18/2013)  . Asthma   . CHF (congestive heart failure) ([place])    . Chronic back pain   . Chronic lower back pain   . Family history of anesthesia complication    Mother and Sister- N/V  . GERD (gastroesophageal reflux disease)   . H/O hiatal hernia   . [id number]    "weekly" (07/18/2013)  . Hyperlipemia   . Hypertension   . Hypothyroidism   . Insomnia   . Kidney stones   . Mental disorder   . Migraine    "years ago" (07/18/2013)  . Osteopenia   . Post-polio syndrome   . Post-polio syndrome   . Sleep apnea   . Takotsubo cardiomyopathy   . Type II diabetes mellitus ([place])     Past Surgical History:  Procedure Laterality Date  . ABDOMINAL HYSTERECTOMY    . APPENDECTOMY    . CARPAL TUNNEL RELEASE Bilateral   . CHOLECYSTECTOMY    . COLONOSCOPY    . CYSTOSCOPY    . KNEE ARTHROSCOPY Left   . KNEE SURGERY Left   . LEG SURGERY Left    polio  . ORIF PATELLA Left 07/18/2013   Procedure: OPEN REDUCTION INTERNAL (ORIF) LEFT FIXATION PATELLA;  Surgeon: Augustin Schooling, MD;  Location: [place];  Service: Orthopedics;  Laterality: Left;  . ORIF PATELLA FRACTURE Left 07/18/2013  . TUBAL LIGATION    . WISDOM TOOTH EXTRACTION    . WRIST TENDON TRANSFER Right      Current Outpatient Medications  Medication Sig Dispense Refill  . ascorbic acid (VITAMIN C) 500 MG tablet Take 500 mg by mouth daily.    Marland Kitchen  aspirin 81 MG tablet Take 81 mg by mouth at bedtime.     Marland Kitchen atorvastatin (LIPITOR) 40 MG tablet Take 40 mg by mouth daily.    Marland Kitchen b complex vitamins tablet Take 1 tablet by mouth daily.    . benzonatate (TESSALON) 100 MG capsule Take 100 mg by mouth 3 (three) times daily as needed for cough.    . busPIRone (BUSPAR) 7.5 MG tablet Take 7.5 mg by mouth daily.    . Calcium Carbonate-Vitamin D (CALTRATE 600+D) 600-400 MG-UNIT per chew tablet Chew 1 tablet by mouth 2 (two) times daily.     . carvedilol (COREG) 12.5 MG tablet TAKE 1 TABLET BY MOUTH TWICE DAILY. 180 tablet 0  . Cholecalciferol (D-3-5) 125 MCG (5000 UT) capsule Take 5,000 Units by mouth daily.      . clonazePAM (KLONOPIN) 0.5 MG tablet Take 0.5 mg by mouth 2 (two) times daily.     . fish oil-omega-3 fatty acids 1000 MG capsule Take 2 g by mouth 2 (two) times daily.      . hydrALAZINE (APRESOLINE) 50 MG tablet Take 1 tablet (50 mg total) by mouth 2 (two) times a day. 60 tablet 5  . hydrochlorothiazide (HYDRODIURIL) 12.5 MG tablet Take 1 tablet by mouth daily.    Marland Kitchen HYDROXYZINE PAMOATE PO Take 25 mg by mouth.    . levothyroxine (SYNTHROID, LEVOTHROID) 100 MCG tablet Take 100 mcg by mouth daily before breakfast.    . metFORMIN (GLUCOPHAGE-XR) 500 MG 24 hr tablet Take 1 tablet by mouth daily.    . mirabegron ER (MYRBETRIQ) 50 MG TB24 tablet Take 50 mg by mouth daily.    . Multiple Vitamins-Minerals (MULTIVITAMIN WITH MINERALS) tablet Take 1 tablet by mouth daily.      . nitroGLYCERIN (NITROSTAT) 0.4 MG SL tablet Place 0.4 mg under the tongue every 5 (five) minutes as needed for chest pain.    Marland Kitchen nystatin cream (MYCOSTATIN) Apply 1 application topically daily as needed for dry skin (irritation. boils on buttocks). Use as needed  0  . pantoprazole (PROTONIX) 40 MG tablet Take 1 tablet by mouth daily. Take 1 tab daily  98  . telmisartan (MICARDIS) 80 MG tablet Take 1 tablet by mouth daily. Take 1 tab daily  0  . tiZANidine (ZANAFLEX) 4 MG tablet Take 4 mg by mouth 5 (five) times daily as needed for muscle spasms. PAIN    . traMADol (ULTRAM) 50 MG tablet Take 1 tablet (50 mg total) by mouth every 6 (six) hours as needed. 8 tablet 0  . vitamin E (VITAMIN E) 400 UNIT capsule Take 400 Units by mouth daily.    Marland Kitchen zinc gluconate 50 MG tablet Take 50 mg by mouth daily.    Marland Kitchen zolpidem (AMBIEN) 5 MG tablet Take 5 mg by mouth at bedtime as needed for sleep.    Marland Kitchen amLODipine (NORVASC) 5 MG tablet TAKE 1 TABLET IN THE EVENING 90 tablet 3   No current facility-administered medications for this visit.    Allergies:   Ciprofloxacin, Carisoprodol-aspirin, Penicillins, Sulfa antibiotics, Acetaminophen, Aspirin,  Macrodantin [nitrofurantoin macrocrystal], Nsaids, Percocet [oxycodone-acetaminophen], Propoxyphene, Propoxyphene n-acetaminophen, Soma compound [carisoprodol-aspirin], and Tolectin [tolmetin]    Social History:  The patient  reports that she has never smoked. She has never used smokeless tobacco. She reports that she does not drink alcohol or use drugs.   Family History:  The patient's family history includes Allergies in her grandchild; Cancer in her father; Diabetes in her father; Heart failure in her  father and mother; Parkinson's disease in her mother.    ROS:  Please see the history of present illness.   Otherwise, review of systems are positive for back pain, leg pain, headaches.   All other systems are reviewed and negative.    PHYSICAL EXAM: VS:  BP (!) 111/58   Pulse 61   Ht 5' (1.524 m)   SpO2 98%   BMI 36.13 kg/m  , BMI Body mass index is 36.13 kg/m. GENERAL:  Well appearing.  In wheelchair. HEENT: Pupils equal round and reactive, fundi not visualized, oral mucosa unremarkable NECK:  No jugular venous distention, waveform within normal limits, carotid upstroke brisk and symmetric, no bruits LUNGS:  Clear to auscultation bilaterally HEART:  RRR.  PMI not displaced or sustained,S1 and S2 within normal limits, no S3, no S4, no clicks, no rubs, no murmurs ABD:  Flat, positive bowel sounds normal in frequency in pitch, no bruits, no rebound, no guarding, no midline pulsatile mass, no hepatomegaly, no splenomegaly EXT:  2 plus pulses throughout, no edema, no cyanosis no clubbing SKIN:  No rashes no nodules NEURO:  Cranial nerves II through XII grossly intact, motor grossly intact throughout PSYCH:  Cognitively intact, oriented to person place and time   EKG:  EKG is ordered today. The ekg ordered today demonstrates sinus rhythm.  61 bpm.  L axis deviation.  Low voltage. 07/31/16: Sinus rhythm. Rate 66. 08/15/17: Sinus bradycardia.  Rate 58 bpm.  01/25/18: Sinus bradycardia.  Rae  55 bpm.   07/09/19: Sinus rhythm.  Rate 61 bpm.  Low voltage.  Poor R wave progression.  Recent Labs: No results found for requested labs within last 8760 hours.    Lipid Panel No results found for: CHOL, TRIG, HDL, CHOLHDL, VLDL, LDLCALC, LDLDIRECT   11/23/14: Chol 111, tri 106, hdl 42, ldl 48 Hgb A1c 5.5  03/23/16:   Sodium 138, potassium 4.7, BUN 13, creatinine 0.49 AST 33, ALT 64 Total cholesterol 101, triglycerides 72, HDL 44, LDL 43  02/20/17: Sodium 138, potassium 4.3, BUN 12, creatinine 0.6 on AST 14, ALT 29 Hemoglobin A1c 5.6% Total cholesterol 120, triglycerides 63, HDL 47, LDL 60 TSH 1.60   Wt Readings from Last 3 Encounters:  01/01/19 185 lb (83.9 kg)  11/04/18 185 lb (83.9 kg)  10/01/18 185 lb (83.9 kg)    TTE 01/31/10: EF 55-60%.  No wall motion abnormalities.  Grade 1 diastolic dysfunction.  Calcification of anterior mitral chordae.  Mildly elevated PASP.  Lexiscan Myoview 08/15/16: The left ventricular ejection fraction is hyperdynamic (>65%).  Nuclear stress EF: 67%.  There was no ST segment deviation noted during stress.  Defect 1: There is a small defect of mild severity present in the basal inferior and mid inferior location.  Findings consistent with ischemia.  This is a low risk study.  Cardiac CT-A 08/29/16:  IMPRESSION: 1) Left dominant coronary arteries with non obstructive disease in proximal and mid LAD  2) Calcium score 56 which is 59th percentile for age and sex  3) Normal aortic root 31 mm   Other studies Reviewed: Additional studies/ records that were reviewed today include: medical record. Review of the above records demonstrates:  Please see elsewhere in the note.     ASSESSMENT AND PLAN:   # Hypertension:Her blood pressure is now getting too low.  She will reduce amlodipine to 5mg .  Continue carvedilol, hydralazine, hydrochlorothiazide, and telmisartan  # Chest pain: # Non-obstructive CAD: # Hyperlipidemia:  No recent  chest pain.  Cardiac CT-A 08/2016 was very reassuring. She has non--obstructive coronary disease. Her symptoms are likely due to stress and anxiety. Continue aspirin 81 mg daily and atorvastatin.  LDL goal <70  # Takotsubo cardiomyopathy: Resolved. LVEF 67% 08/2016.   Current medicines are reviewed at length with the patient today.  The patient does not have concerns regarding medicines.  The following changes have been made:  Reduce amlodipine to 5mg   Labs/ tests ordered today include:    Orders Placed This Encounter  Procedures  . EKG 12-Lead     Disposition:   FU with Dr. Jonelle Sidle C. [place] in 2 months.  Signed, Skeet Latch, MD  07/19/2019 5:04 PM    [place]

## 2019-07-11 ENCOUNTER — Ambulatory Visit
Admission: RE | Admit: 2019-07-11 | Discharge: 2019-07-11 | Disposition: A | Discharge status: Home / Self Care | Payer: Medicare Other | Source: Ambulatory Visit | Attending: Obstetrics and Gynecology | Admitting: Obstetrics and Gynecology

## 2019-07-11 ENCOUNTER — Other Ambulatory Visit: Payer: Self-pay

## 2019-07-11 DIAGNOSIS — Z1231 Encounter for screening mammogram for malignant neoplasm of breast: Secondary | ICD-10-CM | POA: Diagnosis not present

## 2019-07-16 ENCOUNTER — Other Ambulatory Visit: Payer: Self-pay | Admitting: Obstetrics and Gynecology

## 2019-07-16 DIAGNOSIS — R928 Other abnormal and inconclusive findings on diagnostic imaging of breast: Secondary | ICD-10-CM

## 2019-07-18 ENCOUNTER — Ambulatory Visit
Admission: RE | Admit: 2019-07-18 | Discharge: 2019-07-18 | Disposition: A | Discharge status: Home / Self Care | Payer: Medicare Other | Source: Ambulatory Visit | Attending: Obstetrics and Gynecology | Admitting: Obstetrics and Gynecology

## 2019-07-18 ENCOUNTER — Other Ambulatory Visit: Payer: Self-pay

## 2019-07-18 ENCOUNTER — Ambulatory Visit: Payer: Medicare Other

## 2019-07-18 DIAGNOSIS — R928 Other abnormal and inconclusive findings on diagnostic imaging of breast: Secondary | ICD-10-CM | POA: Diagnosis not present

## 2019-07-19 ENCOUNTER — Encounter: Payer: Self-pay | Admitting: Cardiovascular Disease

## 2019-07-29 ENCOUNTER — Other Ambulatory Visit: Payer: Medicare Other

## 2019-08-05 DIAGNOSIS — M25511 Pain in right shoulder: Secondary | ICD-10-CM | POA: Diagnosis not present

## 2019-08-26 ENCOUNTER — Encounter: Payer: Self-pay | Admitting: Cardiovascular Disease

## 2019-08-26 ENCOUNTER — Ambulatory Visit: Payer: Medicare Other | Admitting: Cardiovascular Disease

## 2019-08-26 ENCOUNTER — Other Ambulatory Visit: Payer: Self-pay

## 2019-08-26 VITALS — BP 147/74 | HR 66

## 2019-08-26 DIAGNOSIS — I10 Essential (primary) hypertension: Secondary | ICD-10-CM | POA: Diagnosis not present

## 2019-08-26 DIAGNOSIS — I480 Paroxysmal atrial fibrillation: Secondary | ICD-10-CM

## 2019-08-26 DIAGNOSIS — Z5181 Encounter for therapeutic drug level monitoring: Secondary | ICD-10-CM | POA: Diagnosis not present

## 2019-08-26 MED ORDER — HYDROCHLOROTHIAZIDE 25 MG PO TABS: 25.0000 mg | ORAL_TABLET | Freq: Every day | ORAL | 3 refills | Status: DC

## 2019-08-26 NOTE — Progress Notes (Signed)
Cardiology Office Note   Date:  08/26/2019   ID:  [name], DOB 11-30-1945, MRN [id number]  PCP:  Deland Pretty, MD  Cardiologist:   Skeet Latch, MD   No chief complaint on file.    History of Present Illness: [name] is a 74 y.o. female with  HTN, mild-non-obstructive CAD, Takotsubo syndrome, DM, post-Polio syndrome, and hypothyroidism who presents for follow up. [name] was first seen 12/2014 for hypertension and atypical chest pain. At that appointment [name] had lower extremity edema. Furosemide 3 times weekly was added to her regimen. [name] also reported atypical chest pain and was referred for [facility] 12/31/14 that revealed LVEF 71% with no evidence of ischemia was seen in Kathy ED 07/30/16. [name] was seen in Kathy ED 07/2016 with chest tightness and poorly controlled blood pressure. Cardiac enzymes were negative. Her EKG showed nonspecific ST-T changes. [name] was instructed to follow-up with cardiology as an outpatient. [name] had a Lexiscan Myoview 08/15/16 that revealed LVEF 67% with concern for basal to mid inferior ischemia. [name] subsequently had a cardiac CT-A that revealed 30% LAD stenosis and no obstructive disease.   Metoprolol was switched to carvedilol due to high blood pressures and low heart rates. Amlodipine was discontinued due to lower extremity edema, which improved after making that change. Her BP was elevated so furosemide was switched to HCTZ. This was subsequently held by her PCP due to hyponatremia but later resumed once her sodium improved.  Amlodipine was added back to her regimen and her blood pressure remains poorly-controlled.  Lately [name] has felt well.  However her blood pressure has been getting low at times.  [name] does not get any exercise due to neuropathy.  At her last appointment her blood pressure was getting low so amlodipine was reduced.   Her BP has been 130s-150s  [name] notes improvement in her edema since reducing amlodipine.  [name] has been  feeling well.  [name] notes that [name] has been feeling more tired during Kathy day.  [name] continues to sleep in a recliner.  [name] struggles with feeling tired during Kathy day.  [name] had a sleep study in 2016 that was negative. [name] had to take sublingual nitro twice due to chest pain when [name] was upset.  Past Medical History:  Diagnosis Date  . Angina pectoris ([place]) 08/02/2016  . Anxiety   . Arthritis    "hands, knees, back" (07/18/2013)  . Asthma   . CHF (congestive heart failure) ([place])   . Chronic back pain   . Chronic lower back pain   . Family history of anesthesia complication    Mother and Sister- N/V  . GERD (gastroesophageal reflux disease)   . H/O hiatal hernia   . [id number])    "weekly" (07/18/2013)  . Hyperlipemia   . Hypertension   . Hypothyroidism   . Insomnia   . Kidney stones   . Mental disorder   . Migraine    "years ago" (07/18/2013)  . Osteopenia   . Post-polio syndrome   . Post-polio syndrome   . Sleep apnea   . Takotsubo cardiomyopathy   . Type II diabetes mellitus ([place])     Past Surgical History:  Procedure Laterality Date  . ABDOMINAL HYSTERECTOMY    . APPENDECTOMY    . CARPAL TUNNEL RELEASE Bilateral   . CHOLECYSTECTOMY    . COLONOSCOPY    . CYSTOSCOPY    . KNEE ARTHROSCOPY Left   . KNEE SURGERY Left   .  LEG SURGERY Left    polio  . ORIF PATELLA Left 07/18/2013   Procedure: OPEN REDUCTION INTERNAL (ORIF) LEFT FIXATION PATELLA;  Surgeon: Augustin Schooling, MD;  Location: [facility];  Service: Orthopedics;  Laterality: Left;  . ORIF PATELLA FRACTURE Left 07/18/2013  . TUBAL LIGATION    . WISDOM TOOTH EXTRACTION    . WRIST TENDON TRANSFER Right      Current Outpatient Medications  Medication Sig Dispense Refill  . ACCU-CHEK AVIVA PLUS test strip     . amLODipine (NORVASC) 5 MG tablet TAKE 1 TABLET IN Kathy EVENING 90 tablet 3  . ascorbic acid (VITAMIN C) 500 MG tablet Take 500 mg by mouth daily.    Marland Kitchen aspirin 81 MG tablet Take 81 mg by mouth at bedtime.     Marland Kitchen  atorvastatin (LIPITOR) 40 MG tablet Take 40 mg by mouth daily.    Marland Kitchen b complex vitamins tablet Take 1 tablet by mouth daily.    . benzonatate (TESSALON) 100 MG capsule Take 100 mg by mouth 3 (three) times daily as needed for cough.    . busPIRone (BUSPAR) 7.5 MG tablet Take 7.5 mg by mouth daily.    . Calcium Carbonate-Vitamin D (CALTRATE 600+D) 600-400 MG-UNIT per chew tablet Chew 1 tablet by mouth 2 (two) times daily.     . carvedilol (COREG) 12.5 MG tablet TAKE 1 TABLET BY MOUTH TWICE DAILY. 180 tablet 0  . Cholecalciferol (D-3-5) 125 MCG (5000 UT) capsule Take 5,000 Units by mouth daily.    . clonazePAM (KLONOPIN) 0.5 MG tablet Take 0.5 mg by mouth 2 (two) times daily.     . diclofenac Sodium (VOLTAREN) 1 % GEL Voltaren 1 % topical gel  APPLY 2 GRAM TO Kathy AFFECTED AREA(S) BY TOPICAL ROUTE 2-3 TIMES PER DAY    . famotidine (PEPCID) 20 MG tablet Take 20 mg by mouth daily.    . fish oil-omega-3 fatty acids 1000 MG capsule Take 2 g by mouth 2 (two) times daily.      . hydrochlorothiazide (HYDRODIURIL) 25 MG tablet Take 1 tablet (25 mg total) by mouth daily. 90 tablet 3  . HYDROXYZINE PAMOATE PO Take 25 mg by mouth.    . levothyroxine (SYNTHROID, LEVOTHROID) 100 MCG tablet Take 100 mcg by mouth daily before breakfast.    . metFORMIN (GLUCOPHAGE-XR) 500 MG 24 hr tablet Take 1 tablet by mouth daily.    . mirabegron ER (MYRBETRIQ) 50 MG TB24 tablet Take 50 mg by mouth daily.    . Multiple Vitamins-Minerals (MULTIVITAMIN WITH MINERALS) tablet Take 1 tablet by mouth daily.      . nitroGLYCERIN (NITROSTAT) 0.4 MG SL tablet Place 0.4 mg under Kathy tongue every 5 (five) minutes as needed for chest pain.    Marland Kitchen nystatin cream (MYCOSTATIN) Apply 1 application topically daily as needed for dry skin (irritation. boils on buttocks). Use as needed  0  . pantoprazole (PROTONIX) 40 MG tablet Take 1 tablet by mouth daily. Take 1 tab daily  98  . telmisartan (MICARDIS) 80 MG tablet Take 1 tablet by mouth daily. Take  1 tab daily  0  . tiZANidine (ZANAFLEX) 4 MG tablet Take 4 mg by mouth 5 (five) times daily as needed for muscle spasms. PAIN    . traMADol (ULTRAM) 50 MG tablet Take 1 tablet (50 mg total) by mouth every 6 (six) hours as needed. 8 tablet 0  . vitamin E (VITAMIN E) 400 UNIT capsule Take 400 Units by mouth daily.    Marland Kitchen  zinc gluconate 50 MG tablet Take 50 mg by mouth daily.    Marland Kitchen zolpidem (AMBIEN) 5 MG tablet Take 5 mg by mouth at bedtime as needed for sleep.    . hydrALAZINE (APRESOLINE) 50 MG tablet Take 1 tablet (50 mg total) by mouth 2 (two) times a day. 60 tablet 5   No current facility-administered medications for this visit.    Allergies:   Ciprofloxacin, Carisoprodol-aspirin, Penicillins, Sulfa antibiotics, Acetaminophen, Aspirin, Macrodantin [nitrofurantoin macrocrystal], Nsaids, Percocet [oxycodone-acetaminophen], Propoxyphene, Propoxyphene n-acetaminophen, Soma compound [carisoprodol-aspirin], and Tolectin [tolmetin]    Social History:  [name]  reports that [name] has never smoked. [name] has never used smokeless tobacco. [name] reports that [name] does not drink alcohol or use drugs.   Family History:  [name]'s family history includes Allergies in her grandchild; Cancer in her father; Diabetes in her father; Heart failure in her father and mother; Parkinson's disease in her mother.    ROS:  Please see Kathy history of present illness.   Otherwise, review of systems are positive for back pain, leg pain, headaches.   All other systems are reviewed and negative.    PHYSICAL EXAM: VS:  BP (!) 147/74   Pulse 66   SpO2 98%  , BMI There is no height or weight on file to calculate BMI. GENERAL:  Well appearing.  Sitting in wheelchair. HEENT: Pupils equal round and reactive, fundi not visualized, oral mucosa unremarkable NECK:  No jugular venous distention, waveform within normal limits, carotid upstroke brisk and symmetric, no bruits LUNGS:  Clear to auscultation bilaterally HEART:  RRR.   PMI not displaced or sustained,S1 and S2 within normal limits, no S3, no S4, no clicks, no rubs, no murmurs ABD:  Flat, positive bowel sounds normal in frequency in pitch, no bruits, no rebound, no guarding, no midline pulsatile mass, no hepatomegaly, no splenomegaly EXT:  2 plus pulses throughout, no edema, no cyanosis no clubbing SKIN:  No rashes no nodules NEURO:  Cranial nerves II through XII grossly intact, motor grossly intact throughout PSYCH:  Cognitively intact, oriented to person place and time   EKG:  EKG is ordered today. Kathy ekg ordered today demonstrates sinus rhythm.  61 bpm.  L axis deviation.  Low voltage. 07/31/16: Sinus rhythm. Rate 66. 08/15/17: Sinus bradycardia.  Rate 58 bpm.  01/25/18: Sinus bradycardia.  Rae 55 bpm.   07/09/19: Sinus rhythm.  Rate 61 bpm.  Low voltage.  Poor R wave progression.  Recent Labs: No results found for requested labs within last 8760 hours.    Lipid Panel No results found for: CHOL, TRIG, HDL, CHOLHDL, VLDL, LDLCALC, LDLDIRECT   11/23/14: Chol 111, tri 106, hdl 42, ldl 48 Hgb A1c 5.5  03/23/16:   Sodium 138, potassium 4.7, BUN 13, creatinine 0.49 AST 33, ALT 64 Total cholesterol 101, triglycerides 72, HDL 44, LDL 43  02/20/17: Sodium 138, potassium 4.3, BUN 12, creatinine 0.6 on AST 14, ALT 29 Hemoglobin A1c 5.6% Total cholesterol 120, triglycerides 63, HDL 47, LDL 60 TSH 1.60   Wt Readings from Last 3 Encounters:  01/01/19 185 lb (83.9 kg)  11/04/18 185 lb (83.9 kg)  10/01/18 185 lb (83.9 kg)    TTE 01/31/10: EF 55-60%.  No wall motion abnormalities.  Grade 1 diastolic dysfunction.  Calcification of anterior mitral chordae.  Mildly elevated PASP.  Lexiscan Myoview 08/15/16: Kathy left ventricular ejection fraction is hyperdynamic (>65%).  Nuclear stress EF: 67%.  There was no ST segment deviation noted during stress.  Defect 1: There is a small defect of mild severity present in Kathy basal inferior and mid inferior  location.  Findings consistent with ischemia.  This is a low risk study.  Cardiac CT-A 08/29/16:  IMPRESSION: 1) Left dominant coronary arteries with non obstructive disease in proximal and mid LAD  2) Calcium score 56 which is 59th percentile for age and sex  3) Normal aortic root 31 mm   Other studies Reviewed: Additional studies/ records that were reviewed today include: medical record. Review of Kathy above records demonstrates:  Please see elsewhere in Kathy note.     ASSESSMENT AND PLAN:   # Hypertension:Blood pressure has been running high.  We will increase hydrochlorothiazide to 25 mg.  Continue amlodipine, carvedilol, hydralazine, and telmisartan.  Check a basic metabolic panel in about a week.  # Chest pain: # Non-obstructive CAD: # Hyperlipidemia:  No recent chest pain. Cardiac CT-A 08/2016 was very reassuring. [name] has non--obstructive coronary disease. Her symptoms are likely due to stress and anxiety. Continue aspirin 81 mg daily and atorvastatin.  LDL goal <70  # Takotsubo cardiomyopathy: Resolved. LVEF 67% 08/2016.   Current medicines are reviewed at length with [name] today.  [name] does not have concerns regarding medicines.  Kathy following changes have been made: Increase HCTZ  Labs/ tests ordered today include:    Orders Placed This Encounter  Procedures  . Basic metabolic panel     Disposition:   FU with Dr. Jonelle Sidle C. [place] in 2 months.  Signed, Skeet Latch, MD  08/26/2019 5:13 PM    [facility]

## 2019-08-26 NOTE — Patient Instructions (Signed)
Medication Instructions:  INCREASE YOUR HYDROCHLOROTHIAZIDE TO 25 MG DAILY    *If you need a refill on your cardiac medications before your next appointment, please call your pharmacy*  Lab Work: BMET 1 WEEK AFTER MAKING CHANGE  If you have labs (blood work) drawn today and your tests are completely normal, you will receive your results only by: Marland Kitchen MyChart Message (if you have MyChart) OR . A paper copy in the mail If you have any lab test that is abnormal or we need to change your treatment, we will call you to review the results.  Testing/Procedures: NONE  Follow-Up: At [facility], you and your health needs are our priority.  As part of our continuing mission to provide you with exceptional heart care, we have created designated Provider Care Teams.  These Care Teams include your primary Cardiologist (physician) and Advanced Practice Providers (APPs -  Physician Assistants and Nurse Practitioners) who all work together to provide you with the care you need, when you need it.  We recommend signing up for the patient portal called "MyChart".  Sign up information is provided on this After Visit Summary.  MyChart is used to connect with patients for Virtual Visits (Telemedicine).  Patients are able to view lab/test results, encounter notes, upcoming appointments, etc.  Non-urgent messages can be sent to your provider as well.   To learn more about what you can do with MyChart, go to [email or web address].    Your next appointment:   2 month(s)  The format for your next appointment:   In Person  Provider:   You may see Skeet Latch, MD or one of the following Advanced Practice Providers on your designated Care Team:    Kerin Ransom, PA-C  [place], [place]  Coletta Memos, [place]

## 2019-08-28 DIAGNOSIS — R1030 Lower abdominal pain, unspecified: Secondary | ICD-10-CM | POA: Diagnosis not present

## 2019-08-28 DIAGNOSIS — K5901 Slow transit constipation: Secondary | ICD-10-CM | POA: Diagnosis not present

## 2019-09-01 ENCOUNTER — Other Ambulatory Visit: Payer: Self-pay | Admitting: Physician Assistant

## 2019-09-01 ENCOUNTER — Telehealth: Payer: Self-pay | Admitting: Physician Assistant

## 2019-09-01 DIAGNOSIS — I10 Essential (primary) hypertension: Secondary | ICD-10-CM

## 2019-09-01 MED ORDER — HYDROCHLOROTHIAZIDE 25 MG PO TABS: 12.5000 mg | ORAL_TABLET | Freq: Every day | ORAL | 3 refills | Status: DC

## 2019-09-01 NOTE — Telephone Encounter (Signed)
   [name] called because her BP has been running low the last few days.   When she saw Dr Oval Linsey, her HCTZ was increased from 12.5 mg qd>>25 mg qd.  She has been weaker than usual the last few days. SBP has been 90s-100s and HR 58 or more.  Advised that she could go back on the HCTZ 12.5 mg qd and gave her extra tabs to take for SBP > 140.   F/u as scheduled, call for additional concerns.   [name], PA-C 09/01/2019 8:16 AM

## 2019-09-04 ENCOUNTER — Telehealth: Payer: Self-pay | Admitting: Cardiovascular Disease

## 2019-09-04 NOTE — Telephone Encounter (Signed)
Spoke with patient. Patient has been taking 12.5mg  of HCTZ since 4/14 because her pill packs were being shipped with 3 tablets in each. Patient reports she has been having dizziness and low blood pressure. BP today was 92/55, HR 65. Patient felt like she was going to pass out this morning but she is fine now.   Patient educated multiple times on dosage of HCTZ. Per note on 4/19 with Rhonda Barrett patient decreased to 1/2 tablet of HCTZ (12.5mg ) daily. May take extra 1/2 tablet if SBP >140.   Patient to monitor blood pressure and report back if she continues to have hypotension or becomes hypertensive. Patient going to drink fluids and rest today. Patient to resume HCTZ 12.5mg  once daily starting tomorrow. Patient advised not to take anymore HCTZ today. Patient verbalized understanding.   New pill packs will not arrive until Friday.

## 2019-09-04 NOTE — Telephone Encounter (Signed)
Pt c/o medication issue:  1. Name of Medication:   hydrochlorothiazide (HYDRODIURIL) 25 MG tablet   2. How are you currently taking this medication (dosage and times per day)? 1 tablet 3 times a day  3. Are you having a reaction (difficulty breathing--STAT)? no  4. What is your medication issue? Patient states her instructions for the medication states to take 1 tablet once a day, but she is getting pill packs of 3 tablets and has been taking 1 tablet 3 times a day since Saturday. She would like to know how she is supposed to take the medication. She also states she felt like she was going to pass out this morning, but does not feel that way now. She states she is sitting and does not feel dizzy. She says her BP was 92/55 HR 65 this morning. She also states she has been feeling weak for the past few days. Please advise.

## 2019-09-05 DIAGNOSIS — I10 Essential (primary) hypertension: Secondary | ICD-10-CM | POA: Diagnosis not present

## 2019-09-05 DIAGNOSIS — Z5181 Encounter for therapeutic drug level monitoring: Secondary | ICD-10-CM | POA: Diagnosis not present

## 2019-09-05 DIAGNOSIS — E871 Hypo-osmolality and hyponatremia: Secondary | ICD-10-CM | POA: Diagnosis not present

## 2019-09-06 LAB — BASIC METABOLIC PANEL
BUN/Creatinine Ratio: 24 (ref 12–28)
BUN: 16 mg/dL (ref 8–27)
CO2: 26 mmol/L (ref 20–29)
Calcium: 9.9 mg/dL (ref 8.7–10.3)
Chloride: 89 mmol/L — ABNORMAL LOW (ref 96–106)
Creatinine, Ser: 0.66 mg/dL (ref 0.57–1.00)
GFR calc Af Amer: 100 mL/min/{1.73_m2} (ref >59)
GFR calc non Af Amer: 87 mL/min/{1.73_m2} (ref >59)
Glucose: 134 mg/dL — ABNORMAL HIGH (ref 65–99)
Potassium: 4.4 mmol/L (ref 3.5–5.2)
Sodium: 130 mmol/L — ABNORMAL LOW (ref 134–144)

## 2019-09-08 DIAGNOSIS — R490 Dysphonia: Secondary | ICD-10-CM | POA: Diagnosis not present

## 2019-09-08 DIAGNOSIS — K219 Gastro-esophageal reflux disease without esophagitis: Secondary | ICD-10-CM | POA: Diagnosis not present

## 2019-09-08 DIAGNOSIS — Z85828 Personal history of other malignant neoplasm of skin: Secondary | ICD-10-CM | POA: Diagnosis not present

## 2019-09-08 DIAGNOSIS — L821 Other seborrheic keratosis: Secondary | ICD-10-CM | POA: Diagnosis not present

## 2019-09-09 ENCOUNTER — Telehealth: Payer: Self-pay | Admitting: Cardiovascular Disease

## 2019-09-09 NOTE — Telephone Encounter (Signed)
Called and spoke with pt. She states that her mouth is "drawing out" she reports that it does feel dry but that it "just doesn't feel right. It feels like it is pulling"  she endorses wearing false teeth and is unsure if this is causing it. She also reports having leg pain in her R leg and groin area for the last 2-3 days. She states she is a polio pt and was not sure if this was causing the issue or not. She denies any redness or warmth in the area and states it is a "throbbing leg pain". She denies SOB and CP as well. Notified I would send this message to [place] for review. Pt verbalized understanding with no other questions at this time.

## 2019-09-09 NOTE — Telephone Encounter (Signed)
[name] is calling stating it feels like her mouth is drawing everyday and she is also experiencing pain in her right leg in the groin area. Please advise.

## 2019-09-09 NOTE — Telephone Encounter (Signed)
Please call for an appointment with your PCP.

## 2019-09-10 ENCOUNTER — Telehealth: Payer: Self-pay | Admitting: *Deleted

## 2019-09-10 DIAGNOSIS — I10 Essential (primary) hypertension: Secondary | ICD-10-CM | POA: Diagnosis not present

## 2019-09-10 DIAGNOSIS — E871 Hypo-osmolality and hyponatremia: Secondary | ICD-10-CM | POA: Diagnosis not present

## 2019-09-10 NOTE — Telephone Encounter (Signed)
-----   Message from Skeet Latch, MD sent at 09/09/2019  2:40 PM EDT ----- Sodium is low.  This is probably because of increasing the HCTZ.  Hold it tomorrow then restart 12.5mg  on Thursday.  Increase hydralazine to 100mg .

## 2019-09-10 NOTE — Telephone Encounter (Signed)
Patient saw nephrologist today  He did stop HCTZ and start Torsemide 5 mg daily  Will forward to Dr Oval Linsey for review

## 2019-09-10 NOTE — Telephone Encounter (Signed)
Advised patient, verbalized understanding  

## 2019-09-23 DIAGNOSIS — E119 Type 2 diabetes mellitus without complications: Secondary | ICD-10-CM | POA: Diagnosis not present

## 2019-09-23 DIAGNOSIS — E039 Hypothyroidism, unspecified: Secondary | ICD-10-CM | POA: Diagnosis not present

## 2019-09-23 DIAGNOSIS — E559 Vitamin D deficiency, unspecified: Secondary | ICD-10-CM | POA: Diagnosis not present

## 2019-09-23 DIAGNOSIS — M810 Age-related osteoporosis without current pathological fracture: Secondary | ICD-10-CM | POA: Diagnosis not present

## 2019-09-23 DIAGNOSIS — E7800 Pure hypercholesterolemia, unspecified: Secondary | ICD-10-CM | POA: Diagnosis not present

## 2019-09-23 DIAGNOSIS — I10 Essential (primary) hypertension: Secondary | ICD-10-CM | POA: Diagnosis not present

## 2019-09-25 DIAGNOSIS — H35363 Drusen (degenerative) of macula, bilateral: Secondary | ICD-10-CM | POA: Diagnosis not present

## 2019-09-25 DIAGNOSIS — Z961 Presence of intraocular lens: Secondary | ICD-10-CM | POA: Diagnosis not present

## 2019-09-25 DIAGNOSIS — H26493 Other secondary cataract, bilateral: Secondary | ICD-10-CM | POA: Diagnosis not present

## 2019-09-25 DIAGNOSIS — H04123 Dry eye syndrome of bilateral lacrimal glands: Secondary | ICD-10-CM | POA: Diagnosis not present

## 2019-10-09 DIAGNOSIS — E871 Hypo-osmolality and hyponatremia: Secondary | ICD-10-CM | POA: Diagnosis not present

## 2019-10-19 ENCOUNTER — Telehealth: Payer: Self-pay | Admitting: Physician Assistant

## 2019-10-19 NOTE — Telephone Encounter (Addendum)
   The patient called the answering service after-hours today about blood pressure.  She checked her VS this afternoon: Her blood pressure was 98/54, HR 64. She was concerned about the low BP reading. Repeat BP 123/70, HR 60.  She has been feeling weak for 4 weeks - generalized, all over weakness. No acute complaints today. Also noted to have other medical issues as outlined in chart. She is in a wheelchair chronically. I advised she stop her amlodipine for now (due at 5pm) and call her family doctor to be seen tomorrow for follow-up eval of sodium, blood count, kidney function, etc. Discussed ER precautions with patient if symptoms persistent/worsen. Also asked her to notify us if her BP later this evening goes back to <100, at which time she may need further instructions on other medications as well.  Will also route to Dr. Oval Linsey for further input/recs. The patient has f/u with Dr. Oval Linsey later this month.  The patient verbalized understanding and gratitude.  Charlie Pitter, PA-C

## 2019-10-20 DIAGNOSIS — L989 Disorder of the skin and subcutaneous tissue, unspecified: Secondary | ICD-10-CM | POA: Diagnosis not present

## 2019-10-20 DIAGNOSIS — I10 Essential (primary) hypertension: Secondary | ICD-10-CM | POA: Diagnosis not present

## 2019-10-20 DIAGNOSIS — M810 Age-related osteoporosis without current pathological fracture: Secondary | ICD-10-CM | POA: Diagnosis not present

## 2019-10-21 ENCOUNTER — Telehealth: Payer: Self-pay | Admitting: Cardiovascular Disease

## 2019-10-21 NOTE — Telephone Encounter (Signed)
Patient wants to know if she is to stay off amLODipine (NORVASC) 5 MG tablet or go back on it. Please advise.

## 2019-10-21 NOTE — Telephone Encounter (Signed)
Per pt went to PCP office yesterday and per pt did not feel like visit was addressed  on  why pt was being seen Per Melina Copa PA pt called in a couple of days ago with low B/P so was instructed to hold Amlodipine and f/u with PCP pt done this and was not told if needed to continue to hold or restart Per pt B/p was 124/76 at that  visit .Will forward to Dr Oval Linsey for review and recommendations ./cy

## 2019-10-22 NOTE — Telephone Encounter (Signed)
It seems that her BP is pretty stable without the amlodipine.  Recommend that she continue to hold it.

## 2019-10-23 DIAGNOSIS — E7800 Pure hypercholesterolemia, unspecified: Secondary | ICD-10-CM | POA: Diagnosis not present

## 2019-10-23 DIAGNOSIS — I10 Essential (primary) hypertension: Secondary | ICD-10-CM | POA: Diagnosis not present

## 2019-10-23 DIAGNOSIS — M810 Age-related osteoporosis without current pathological fracture: Secondary | ICD-10-CM | POA: Diagnosis not present

## 2019-10-23 NOTE — Telephone Encounter (Signed)
Left message to call back  

## 2019-10-27 DIAGNOSIS — L720 Epidermal cyst: Secondary | ICD-10-CM | POA: Diagnosis not present

## 2019-11-04 NOTE — Telephone Encounter (Addendum)
Blood pressure today 166/80 11:00 am today  147/78 6/10  6/12 AM 114/64 PM 139/71 HR 64  6/13 AM 89/51 HR PM 134/66 HR 74 6/14 AM 135/76 HR 67 6/15 AM 106/55 HR 66 6/16 AM 144/75 HR 63 6/17 AM 132/70 HR 67 6/18 AM 149/78 HR 60 AFTERNOON 124/60 HR 55 6/19 AM 97/48 HR 49  6/20 AM 153/80 HR 72  PCP stopped her Hydralazine and has been off Amlodipine   Taking following  Telmisartan 80 mg in am Torsemide 5 mg in am  Carvedilol 12.5 mg twice a day   Has appointment with Dr Oval Linsey 6/28, will continue to monitor and bring readings along with blood pressure machines   Will forward to Dr Oval Linsey for review

## 2019-11-04 NOTE — Addendum Note (Signed)
Addended by: Alvina Filbert B on: 11/04/2019 12:15 PM   Modules accepted: Orders

## 2019-11-06 DIAGNOSIS — M810 Age-related osteoporosis without current pathological fracture: Secondary | ICD-10-CM | POA: Diagnosis not present

## 2019-11-06 DIAGNOSIS — G4700 Insomnia, unspecified: Secondary | ICD-10-CM | POA: Diagnosis not present

## 2019-11-06 DIAGNOSIS — I10 Essential (primary) hypertension: Secondary | ICD-10-CM | POA: Diagnosis not present

## 2019-11-10 ENCOUNTER — Ambulatory Visit: Payer: Medicare Other | Admitting: Cardiovascular Disease

## 2019-11-10 ENCOUNTER — Encounter: Payer: Self-pay | Admitting: Cardiovascular Disease

## 2019-11-10 ENCOUNTER — Other Ambulatory Visit: Payer: Self-pay

## 2019-11-10 VITALS — BP 165/70 | HR 53 | Ht 60.0 in

## 2019-11-10 DIAGNOSIS — I480 Paroxysmal atrial fibrillation: Secondary | ICD-10-CM

## 2019-11-10 DIAGNOSIS — I209 Angina pectoris, unspecified: Secondary | ICD-10-CM | POA: Diagnosis not present

## 2019-11-10 DIAGNOSIS — I10 Essential (primary) hypertension: Secondary | ICD-10-CM | POA: Diagnosis not present

## 2019-11-10 DIAGNOSIS — J382 Nodules of vocal cords: Secondary | ICD-10-CM | POA: Diagnosis not present

## 2019-11-10 DIAGNOSIS — R490 Dysphonia: Secondary | ICD-10-CM | POA: Diagnosis not present

## 2019-11-10 MED ORDER — AMLODIPINE BESYLATE 2.5 MG PO TABS: 2.5000 mg | ORAL_TABLET | Freq: Every day | ORAL | 3 refills | Status: DC
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
## 2019-11-10 NOTE — Patient Instructions (Signed)
Medication Instructions:  START AMLODIPINE 2.5 MG DAILY   *If you need a refill on your cardiac medications before your next appointment, please call your pharmacy*  Lab Work: NONE   Testing/Procedures: Your physician has requested that you have a renal artery duplex. During this test, an ultrasound is used to evaluate blood flow to the kidneys. Allow one hour for this exam. Do not eat after midnight the day before and avoid carbonated beverages. Take your medications as you usually do.  Follow-Up: At [facility], you and your health needs are our priority.  As part of our continuing mission to provide you with exceptional heart care, we have created designated Provider Care Teams.  These Care Teams include your primary Cardiologist (physician) and Advanced Practice Providers (APPs -  Physician Assistants and Nurse Practitioners) who all work together to provide you with the care you need, when you need it.  We recommend signing up for the patient portal called "MyChart".  Sign up information is provided on this After Visit Summary.  MyChart is used to connect with patients for Virtual Visits (Telemedicine).  Patients are able to view lab/test results, encounter notes, upcoming appointments, etc.  Non-urgent messages can be sent to your provider as well.   To learn more about what you can do with MyChart, go to [email or web address].    Your next appointment:   1 month(s)  The format for your next appointment:   Virtual Visit   Provider:   You may see Skeet Latch, MD  or one of the following Advanced Practice Providers on your designated Care Team:    Kerin Ransom, PA-C  [place], [place]  Coletta Memos, [place]

## 2019-11-19 DIAGNOSIS — L989 Disorder of the skin and subcutaneous tissue, unspecified: Secondary | ICD-10-CM | POA: Diagnosis not present

## 2019-11-24 DIAGNOSIS — R197 Diarrhea, unspecified: Secondary | ICD-10-CM | POA: Diagnosis not present

## 2019-12-03 DIAGNOSIS — M545 Low back pain: Secondary | ICD-10-CM | POA: Diagnosis not present

## 2019-12-03 DIAGNOSIS — M25562 Pain in left knee: Secondary | ICD-10-CM | POA: Diagnosis not present

## 2019-12-10 ENCOUNTER — Inpatient Hospital Stay (HOSPITAL_COMMUNITY): Admission: RE | Admit: 2019-12-10 | Payer: Medicare Other | Source: Ambulatory Visit

## 2019-12-15 NOTE — Progress Notes (Signed)
Error

## 2019-12-16 ENCOUNTER — Telehealth (INDEPENDENT_AMBULATORY_CARE_PROVIDER_SITE_OTHER): Payer: Medicare Other | Admitting: General Practice

## 2019-12-16 ENCOUNTER — Encounter: Payer: Self-pay | Admitting: General Practice

## 2019-12-16 DIAGNOSIS — I10 Essential (primary) hypertension: Secondary | ICD-10-CM | POA: Diagnosis not present

## 2019-12-16 DIAGNOSIS — I209 Angina pectoris, unspecified: Secondary | ICD-10-CM | POA: Diagnosis not present

## 2019-12-16 DIAGNOSIS — E7800 Pure hypercholesterolemia, unspecified: Secondary | ICD-10-CM | POA: Diagnosis not present

## 2019-12-16 DIAGNOSIS — I5181 Takotsubo syndrome: Secondary | ICD-10-CM | POA: Diagnosis not present

## 2019-12-16 NOTE — Progress Notes (Signed)
error 

## 2019-12-16 NOTE — Patient Instructions (Signed)
Medication Instructions:  The current medical regimen is effective;  continue present plan and medications as directed. Please refer to the Current Medication list given to you today. *If you need a refill on your cardiac medications before your next appointment, please call your pharmacy*  Testing/Procedures: SOMEONE WILL BE CALLING YOU TO SCHEDULE THIS TEST  Special Instructions PLEASE PURCHASE AND TAKE YOU BLOOD PRESSURE DAILY AND BRING THIS WITH YOU TO FOLLOW UP APPOINTMENT  PLEASE READ AND FOLLOW SALTY 6-ATTACHED  Follow-Up: Your next appointment:  3 month(s) In Person with You may see Skeet Latch, MD or one of the following Advanced Practice Providers on your designated Care Team:  Coletta Memos, FNP Kerin Ransom, PA-C or Sande Rives, [place]  At [facility], you and your health needs are our priority.  As part of our continuing mission to provide you with exceptional heart care, we have created designated Provider Care Teams.  These Care Teams include your primary Cardiologist (physician) and Advanced Practice Providers (APPs -  Physician Assistants and Nurse Practitioners) who all work together to provide you with the care you need, when you need it.  We recommend signing up for the patient portal called "MyChart".  Sign up information is provided on this After Visit Summary.  MyChart is used to connect with patients for Virtual Visits (Telemedicine).  Patients are able to view lab/test results, encounter notes, upcoming appointments, etc.  Non-urgent messages can be sent to your provider as well.   To learn more about what you can do with MyChart, go to [email or web address].
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
## 2019-12-17 DIAGNOSIS — J384 Edema of larynx: Secondary | ICD-10-CM | POA: Diagnosis not present

## 2019-12-17 DIAGNOSIS — M6281 Muscle weakness (generalized): Secondary | ICD-10-CM | POA: Diagnosis not present

## 2019-12-17 DIAGNOSIS — J383 Other diseases of vocal cords: Secondary | ICD-10-CM | POA: Diagnosis not present

## 2019-12-18 ENCOUNTER — Telehealth: Payer: Self-pay | Admitting: Cardiovascular Disease

## 2019-12-18 DIAGNOSIS — M25511 Pain in right shoulder: Secondary | ICD-10-CM | POA: Diagnosis not present

## 2019-12-18 DIAGNOSIS — M1712 Unilateral primary osteoarthritis, left knee: Secondary | ICD-10-CM | POA: Diagnosis not present

## 2019-12-18 DIAGNOSIS — M25572 Pain in left ankle and joints of left foot: Secondary | ICD-10-CM | POA: Diagnosis not present

## 2019-12-18 DIAGNOSIS — M25562 Pain in left knee: Secondary | ICD-10-CM | POA: Diagnosis not present

## 2019-12-18 DIAGNOSIS — G8221 Paraplegia, complete: Secondary | ICD-10-CM | POA: Diagnosis not present

## 2019-12-18 NOTE — Telephone Encounter (Signed)
Patient is calling stating that Dr. Oval Linsey wants to do a Renal US but she is already scheduled to have a Renal US done with Dr. Tresa Endo, Urologist, on 01/26/20 at 1030am. She states that she spoke with Gay Filler RN at Dr. Shann Medal office who states that it is the same test. Patient states she doesn't want to do it with Dr. Oval Linsey, if it's the same test, due to how it was described to her what they would do, and also because she is afraid of insurance not covering it. She is wondering if the results could be faxed from Dr. Shann Medal office once completed and resulted to Dr. Oval Linsey. Dr. Shann Medal office number is  [phone]. Please advise.

## 2019-12-19 NOTE — Telephone Encounter (Signed)
Left message for Dr Rosana Hoes nurse to call to discuss Per Dr Oval Linsey ok for them to do if can do when she has her ultrasound there

## 2019-12-19 NOTE — Telephone Encounter (Signed)
Route to nurse as she was contacting office.  Thank you!

## 2019-12-19 NOTE — Telephone Encounter (Signed)
Tammie from Dr. Rosana Hoes' office returning call.

## 2019-12-22 NOTE — Telephone Encounter (Signed)
[name] returning call.  

## 2019-12-22 NOTE — Telephone Encounter (Signed)
Left message for [name] to call back  

## 2019-12-29 DIAGNOSIS — H26493 Other secondary cataract, bilateral: Secondary | ICD-10-CM | POA: Diagnosis not present

## 2019-12-29 DIAGNOSIS — H18513 Endothelial corneal dystrophy, bilateral: Secondary | ICD-10-CM | POA: Diagnosis not present

## 2019-12-29 DIAGNOSIS — Z961 Presence of intraocular lens: Secondary | ICD-10-CM | POA: Diagnosis not present

## 2019-12-29 DIAGNOSIS — H04123 Dry eye syndrome of bilateral lacrimal glands: Secondary | ICD-10-CM | POA: Diagnosis not present

## 2019-12-29 NOTE — Telephone Encounter (Signed)
She needs renal artery Dopplers.  This can be done at the same time as her renal ultrasound but is not the same thing.

## 2019-12-31 NOTE — Telephone Encounter (Signed)
Left message for [name] to call back

## 2019-12-31 NOTE — Telephone Encounter (Signed)
[name] returning call.

## 2020-01-01 ENCOUNTER — Telehealth: Payer: Self-pay | Admitting: Cardiovascular Disease

## 2020-01-01 NOTE — Telephone Encounter (Signed)
[facility] returning Melinda's call. Transferred call to [place].

## 2020-01-01 NOTE — Telephone Encounter (Signed)
Spoke with Tammie and we both agreed not the same testing to be done  Advised patient and sent to scheduling to reschedule renal duplex

## 2020-01-28 ENCOUNTER — Encounter (HOSPITAL_COMMUNITY): Payer: Medicare Other

## 2020-02-06 DIAGNOSIS — M810 Age-related osteoporosis without current pathological fracture: Secondary | ICD-10-CM | POA: Diagnosis not present

## 2020-02-06 DIAGNOSIS — I252 Old myocardial infarction: Secondary | ICD-10-CM | POA: Diagnosis not present

## 2020-02-06 DIAGNOSIS — I2510 Atherosclerotic heart disease of native coronary artery without angina pectoris: Secondary | ICD-10-CM | POA: Diagnosis not present

## 2020-02-06 DIAGNOSIS — R600 Localized edema: Secondary | ICD-10-CM | POA: Diagnosis not present

## 2020-02-17 ENCOUNTER — Other Ambulatory Visit: Payer: Self-pay

## 2020-02-17 ENCOUNTER — Ambulatory Visit (HOSPITAL_COMMUNITY)
Admission: RE | Admit: 2020-02-17 | Discharge: 2020-02-17 | Disposition: A | Discharge status: Home / Self Care | Payer: Medicare Other | Source: Ambulatory Visit | Attending: Cardiovascular Disease | Admitting: Cardiovascular Disease

## 2020-02-17 DIAGNOSIS — I10 Essential (primary) hypertension: Secondary | ICD-10-CM | POA: Diagnosis not present

## 2020-02-17 DIAGNOSIS — E119 Type 2 diabetes mellitus without complications: Secondary | ICD-10-CM | POA: Diagnosis not present

## 2020-02-17 DIAGNOSIS — R197 Diarrhea, unspecified: Secondary | ICD-10-CM | POA: Diagnosis not present

## 2020-02-19 ENCOUNTER — Telehealth: Payer: Self-pay | Admitting: Cardiovascular Disease

## 2020-02-19 NOTE — Telephone Encounter (Signed)
Discussed with Dr Oval Linsey and will refer to Dr Gwenlyn Found or Dr Fletcher Anon. Scheduled first available with Dr Fletcher Anon

## 2020-02-19 NOTE — Telephone Encounter (Signed)
Patient is following up regarding renal artery duplex results. She states she has additional questions. Please return call to further discuss.

## 2020-02-19 NOTE — Telephone Encounter (Signed)
Skeet Latch, MD  02/17/2020 12:26 PM EDT     Blood flow to both kidneys is normal. 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 Unless she has abdominal pain after eating there is nothing to do about this. Continue current therapy.   Patient aware of results She sees GI with [facility] - report routed to the PA she has been seeing

## 2020-02-20 DIAGNOSIS — N3000 Acute cystitis without hematuria: Secondary | ICD-10-CM | POA: Diagnosis not present

## 2020-02-20 NOTE — Telephone Encounter (Signed)
Pt's daughter called and said she wants to know what is going on with her mother's health. Said she was confused by what her mother was telling her so would like someone to explain it better.

## 2020-02-20 NOTE — Telephone Encounter (Signed)
Spoke with pt's husband okayed per pt Pt does have an upcoming appt with Dr Fletcher Anon to discuss abdominal blockage ./cy

## 2020-02-23 DIAGNOSIS — R490 Dysphonia: Secondary | ICD-10-CM | POA: Diagnosis not present

## 2020-02-23 DIAGNOSIS — J384 Edema of larynx: Secondary | ICD-10-CM | POA: Diagnosis not present

## 2020-02-23 DIAGNOSIS — J383 Other diseases of vocal cords: Secondary | ICD-10-CM | POA: Diagnosis not present

## 2020-02-23 DIAGNOSIS — R197 Diarrhea, unspecified: Secondary | ICD-10-CM | POA: Diagnosis not present

## 2020-02-23 DIAGNOSIS — R1314 Dysphagia, pharyngoesophageal phase: Secondary | ICD-10-CM | POA: Diagnosis not present

## 2020-02-23 NOTE — Telephone Encounter (Signed)
Patient's daughter following up  

## 2020-02-23 NOTE — Telephone Encounter (Signed)
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 She is aware of the appointment with dr Fletcher Anon and she will have her mother call her while at that appointment so she can hear what they talk about.

## 2020-02-26 DIAGNOSIS — M6281 Muscle weakness (generalized): Secondary | ICD-10-CM | POA: Diagnosis not present

## 2020-02-27 ENCOUNTER — Telehealth: Payer: Self-pay | Admitting: Cardiovascular Disease

## 2020-02-27 NOTE — Telephone Encounter (Signed)
The patient will have to go through Medical Records. She will be informed at her upcoming appointment.

## 2020-02-27 NOTE — Telephone Encounter (Signed)
Patient's husband calling because wife would like a copy of the disc with her renal ultrasound on it. Please advise.

## 2020-02-27 NOTE — Telephone Encounter (Signed)
Spoke to patient's husband he stated wife has appointment with Dr.Arida Tues 10/19 at 9:00 am.Stated she would like a CD of recent renal doppler.Message sent to [facility] in vascular dept.I also will send message to Dr.Arida's RN.

## 2020-03-02 ENCOUNTER — Ambulatory Visit: Payer: Medicare Other | Admitting: Cardiovascular Disease

## 2020-03-02 ENCOUNTER — Other Ambulatory Visit: Payer: Self-pay

## 2020-03-02 ENCOUNTER — Encounter: Payer: Self-pay | Admitting: Cardiovascular Disease

## 2020-03-02 VITALS — BP 122/50 | HR 56 | Ht 60.0 in

## 2020-03-02 DIAGNOSIS — K551 Chronic vascular disorders of intestine: Secondary | ICD-10-CM

## 2020-03-02 DIAGNOSIS — E785 Hyperlipidemia, unspecified: Secondary | ICD-10-CM

## 2020-03-02 DIAGNOSIS — I10 Essential (primary) hypertension: Secondary | ICD-10-CM | POA: Diagnosis not present

## 2020-03-02 NOTE — Progress Notes (Signed)
Cardiology Office Note   Date:  03/02/2020   ID:  [name], DOB 1945-09-02, MRN [id number]  PCP:  Deland Pretty, MD  Cardiologist:   Dr. Oval Linsey  Chief Complaint  Patient presents with  . New Patient (Initial Visit)      History of Present Illness: [name] is a 74 y.o. female who was referred by Dr. Oval Linsey for evaluation management of superior mesenteric artery stenosis. She has known history of essential hypertension, mild nonobstructive coronary artery disease, stress-induced cardiomyopathy, diabetes mellitus, post polio syndrome and hypothyroidism.  Previous cardiac CTA showed mild nonobstructive disease. She has known history of difficult to control hypertension. Due to that, she underwent renal artery duplex which showed incidental finding of borderline significant SMA stenosis with velocity of 316.  Celiac artery appeared normal.  There was no evidence of renal artery stenosis on the study. She reports frequent abdominal pain which can be related to food or not related to food.  Certain types of food bother her stomach including certain fruits and vegetables.  However, she is able to eat most other meals without postprandial pain.  She is currently mostly wheelchair-bound due to previous polio and ankle injury recently.  Thus, we have not been able to weigh her.  She thinks she might have lost 10 pounds over the last year but we have not been able to verify that.  Past Medical History:  Diagnosis Date  . Angina pectoris ([place]) 08/02/2016  . Anxiety   . Arthritis    "hands, knees, back" (07/18/2013)  . Asthma   . CHF (congestive heart failure) ([place])   . Chronic back pain   . Chronic lower back pain   . Family history of anesthesia complication    Mother and Sister- N/V  . GERD (gastroesophageal reflux disease)   . H/O hiatal hernia   . IZTIWPYK(998.3)    "weekly" (07/18/2013)  . Hyperlipemia   . Hypertension   . Hypothyroidism   . Insomnia   . Kidney stones    . Mental disorder   . Migraine    "years ago" (07/18/2013)  . Osteopenia   . Post-polio syndrome   . Post-polio syndrome   . Sleep apnea   . Takotsubo cardiomyopathy   . Type II diabetes mellitus ([place])     Past Surgical History:  Procedure Laterality Date  . ABDOMINAL HYSTERECTOMY    . APPENDECTOMY    . CARPAL TUNNEL RELEASE Bilateral   . CHOLECYSTECTOMY    . COLONOSCOPY    . CYSTOSCOPY    . KNEE ARTHROSCOPY Left   . KNEE SURGERY Left   . LEG SURGERY Left    polio  . ORIF PATELLA Left 07/18/2013   Procedure: OPEN REDUCTION INTERNAL (ORIF) LEFT FIXATION PATELLA;  Surgeon: Augustin Schooling, MD;  Location: [place];  Service: Orthopedics;  Laterality: Left;  . ORIF PATELLA FRACTURE Left 07/18/2013  . TUBAL LIGATION    . WISDOM TOOTH EXTRACTION    . WRIST TENDON TRANSFER Right      Current Outpatient Medications  Medication Sig Dispense Refill  . ACCU-CHEK AVIVA PLUS test strip     . amLODipine (NORVASC) 2.5 MG tablet Take 1 tablet (2.5 mg total) by mouth daily. (Patient taking differently: Take 2.5 mg by mouth at bedtime. ) 90 tablet 3  . ascorbic acid (VITAMIN C) 500 MG tablet Take 500 mg by mouth daily.    Marland Kitchen aspirin 81 MG tablet Take 81 mg by mouth at bedtime.     Marland Kitchen  atorvastatin (LIPITOR) 40 MG tablet Take 40 mg by mouth daily.    Marland Kitchen b complex vitamins tablet Take 1 tablet by mouth daily.    . benzonatate (TESSALON) 100 MG capsule Take 100 mg by mouth 3 (three) times daily as needed for cough.     . busPIRone (BUSPAR) 7.5 MG tablet Take 7.5 mg by mouth daily.    . Calcium Carbonate-Vitamin D (CALTRATE 600+D) 600-400 MG-UNIT per chew tablet Chew 1 tablet by mouth 2 (two) times daily.     . carvedilol (COREG) 12.5 MG tablet TAKE 1 TABLET BY MOUTH TWICE DAILY. (Patient taking differently: 25 mg. ) 180 tablet 0  . Cholecalciferol (D-3-5) 125 MCG (5000 UT) capsule Take 5,000 Units by mouth daily.     . clonazePAM (KLONOPIN) 0.5 MG tablet Take 0.5 mg by mouth 2 (two) times daily.     .  diclofenac Sodium (VOLTAREN) 1 % GEL Voltaren 1 % topical gel  APPLY 2 GRAM TO THE AFFECTED AREA(S) BY TOPICAL ROUTE 2-3 TIMES PER DAY    . famotidine (PEPCID) 20 MG tablet Take 20 mg by mouth daily.     . fish oil-omega-3 fatty acids 1000 MG capsule Take 2 g by mouth 2 (two) times daily.      Marland Kitchen gabapentin (NEURONTIN) 300 MG capsule Take 300 mg by mouth at bedtime.    Marland Kitchen HYDROXYZINE PAMOATE PO Take 25 mg by mouth.    . levothyroxine (SYNTHROID, LEVOTHROID) 100 MCG tablet Take 100 mcg by mouth daily before breakfast.    . Melatonin 5 MG CAPS Take 5 mg by mouth daily.    . metFORMIN (GLUCOPHAGE-XR) 500 MG 24 hr tablet Take 1 tablet by mouth in the morning and at bedtime.     . mirabegron ER (MYRBETRIQ) 50 MG TB24 tablet Take 50 mg by mouth every other day.     . Multiple Vitamins-Minerals (MULTIVITAMIN WITH MINERALS) tablet Take 1 tablet by mouth daily.      . nitroGLYCERIN (NITROSTAT) 0.4 MG SL tablet Place 0.4 mg under the tongue every 5 (five) minutes as needed for chest pain.    Marland Kitchen nystatin cream (MYCOSTATIN) Apply 1 application topically daily as needed for dry skin (irritation. boils on buttocks). Use as needed  0  . pantoprazole (PROTONIX) 40 MG tablet Take 1 tablet by mouth daily. Take 1 tab daily  98  . telmisartan (MICARDIS) 80 MG tablet Take 1 tablet by mouth daily. Take 1 tab daily  0  . tiZANidine (ZANAFLEX) 4 MG tablet Take 4 mg by mouth 5 (five) times daily as needed for muscle spasms. PAIN    . torsemide (DEMADEX) 5 MG tablet Take 5 mg by mouth daily.    . traMADol (ULTRAM) 50 MG tablet Take 1 tablet (50 mg total) by mouth every 6 (six) hours as needed. 8 tablet 0  . vitamin E (VITAMIN E) 400 UNIT capsule Take 400 Units by mouth daily.    . Zinc 50 MG TABS Take 50 mg by mouth daily.    Marland Kitchen zinc gluconate 50 MG tablet Take 50 mg by mouth daily.    Marland Kitchen zolpidem (AMBIEN) 5 MG tablet Take 5 mg by mouth at bedtime as needed for sleep.     No current facility-administered medications for  this visit.    Allergies:   Ciprofloxacin, Carisoprodol-aspirin, Penicillins, Sulfa antibiotics, Acetaminophen, Aspirin, Macrodantin [nitrofurantoin macrocrystal], Nsaids, Percocet [oxycodone-acetaminophen], Propoxyphene, Propoxyphene n-acetaminophen, Soma compound [carisoprodol-aspirin], and Tolectin [tolmetin]    Social History:  The  patient  reports that she has never smoked. She has never used smokeless tobacco. She reports that she does not drink alcohol and does not use drugs.   Family History:  The patient's family history includes Allergies in her grandchild; Cancer in her father; Diabetes in her father; Heart failure in her father and mother; Parkinson's disease in her mother.    ROS:  Please see the history of present illness.   Otherwise, review of systems are positive for none.   All other systems are reviewed and negative.    PHYSICAL EXAM: VS:  BP (!) 122/50 (BP Location: Right Arm, Patient Position: Sitting, Cuff Size: Large)   Pulse (!) 56   Ht 5' (1.524 m)   BMI 36.13 kg/m  , BMI Body mass index is 36.13 kg/m. GEN: Well nourished, well developed, in no acute distress  HEENT: normal  Neck: no JVD, carotid bruits, or masses Cardiac: RRR; no murmurs, rubs, or gallops, mild bilateral leg edema worse on the right side. Respiratory:  clear to auscultation bilaterally, normal work of breathing GI: soft, nontender, nondistended, + BS MS: no deformity or atrophy  Skin: warm and dry, no rash Neuro:  Strength and sensation are intact Psych: euthymic mood, full affect   EKG:  EKG is not ordered today.    Recent Labs: 09/05/2019: BUN 16; Creatinine, Ser 0.66; Potassium 4.4; Sodium 130    Lipid Panel No results found for: CHOL, TRIG, HDL, CHOLHDL, VLDL, LDLCALC, LDLDIRECT    Wt Readings from Last 3 Encounters:  01/01/19 185 lb (83.9 kg)  11/04/18 185 lb (83.9 kg)  10/01/18 185 lb (83.9 kg)       No flowsheet data found.    ASSESSMENT AND PLAN:  1.   Stenosis of the superior mesenteric artery: I am not convinced that her symptoms represent chronic mesenteric ischemia.  She is able to eat most types of food without symptoms although she seems to have symptoms when she eats certain types.  In addition, she reports frequent pain that is not related to eating and also frequent diarrhea. Stenosis of the SMA alone is also likely not sufficient to cause chronic mesenteric ischemia.  In addition, there was some limitations with the ultrasound study due to body habitus. If symptoms worsen or she starts having clear postprandial pain, then proceeding with angiography would be next step but I do not think that is indicated at the present time.  2.  Essential hypertension: Blood pressure is controlled.  Renal artery duplex showed no evidence of renal artery stenosis.  3.  Hyperlipidemia: Continue treatment with atorvastatin with a target LDL of less than 70 given evidence of atherosclerosis.    Disposition:   FU with me in 1 year  Signed,  Kathlyn Sacramento, MD  03/02/2020 9:12 AM    [place]

## 2020-03-02 NOTE — Patient Instructions (Signed)

## 2020-03-03 DIAGNOSIS — I7090 Unspecified atherosclerosis: Secondary | ICD-10-CM | POA: Diagnosis not present

## 2020-03-03 DIAGNOSIS — R1084 Generalized abdominal pain: Secondary | ICD-10-CM | POA: Diagnosis not present

## 2020-03-03 DIAGNOSIS — K551 Chronic vascular disorders of intestine: Secondary | ICD-10-CM | POA: Diagnosis not present

## 2020-03-11 ENCOUNTER — Ambulatory Visit: Payer: Medicare Other | Admitting: Cardiovascular Disease

## 2020-03-11 ENCOUNTER — Encounter: Payer: Self-pay | Admitting: Cardiovascular Disease

## 2020-03-11 ENCOUNTER — Other Ambulatory Visit: Payer: Self-pay

## 2020-03-11 VITALS — BP 131/69 | HR 75 | Ht 60.0 in

## 2020-03-11 DIAGNOSIS — I480 Paroxysmal atrial fibrillation: Secondary | ICD-10-CM

## 2020-03-11 DIAGNOSIS — I10 Essential (primary) hypertension: Secondary | ICD-10-CM | POA: Diagnosis not present
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
## 2020-03-11 NOTE — Patient Instructions (Signed)
Medication Instructions:  Your physician recommends that you continue on your current medications as directed. Please refer to the Current Medication list given to you today.  *If you need a refill on your cardiac medications before your next appointment, please call your pharmacy*  Lab Work: none  Testing/Procedures: none  Follow-Up: At [facility], you and your health needs are our priority.  As part of our continuing mission to provide you with exceptional heart care, we have created designated Provider Care Teams.  These Care Teams include your primary Cardiologist (physician) and Advanced Practice Providers (APPs -  Physician Assistants and Nurse Practitioners) who all work together to provide you with the care you need, when you need it.  We recommend signing up for the patient portal called "MyChart".  Sign up information is provided on this After Visit Summary.  MyChart is used to connect with patients for Virtual Visits (Telemedicine).  Patients are able to view lab/test results, encounter notes, upcoming appointments, etc.  Non-urgent messages can be sent to your provider as well.   To learn more about what you can do with MyChart, go to [email or web address].    Your next appointment:   6 month(s)  You will receive a reminder letter in the mail two months in advance. If you don't receive a letter, please call our office to schedule the follow-up appointment.  The format for your next appointment:   In Person  Provider:   You may see Skeet Latch, MD or one of the following Advanced Practice Providers on your designated Care Team:    Kerin Ransom, PA-C  [place], [place]  Coletta Memos, [place]

## 2020-03-19 ENCOUNTER — Ambulatory Visit: Payer: Medicare Other | Admitting: Cardiovascular Disease

## 2020-03-30 DIAGNOSIS — R194 Change in bowel habit: Secondary | ICD-10-CM | POA: Diagnosis not present

## 2020-03-30 DIAGNOSIS — K219 Gastro-esophageal reflux disease without esophagitis: Secondary | ICD-10-CM | POA: Diagnosis not present

## 2020-03-30 DIAGNOSIS — K551 Chronic vascular disorders of intestine: Secondary | ICD-10-CM | POA: Diagnosis not present

## 2020-05-21 DIAGNOSIS — Z9889 Other specified postprocedural states: Secondary | ICD-10-CM | POA: Diagnosis not present

## 2020-05-21 DIAGNOSIS — K529 Noninfective gastroenteritis and colitis, unspecified: Secondary | ICD-10-CM | POA: Diagnosis not present

## 2020-05-21 DIAGNOSIS — R12 Heartburn: Secondary | ICD-10-CM | POA: Diagnosis not present

## 2020-05-21 DIAGNOSIS — R197 Diarrhea, unspecified: Secondary | ICD-10-CM | POA: Diagnosis not present

## 2020-05-21 DIAGNOSIS — R1310 Dysphagia, unspecified: Secondary | ICD-10-CM | POA: Diagnosis not present

## 2020-05-21 DIAGNOSIS — K635 Polyp of colon: Secondary | ICD-10-CM | POA: Diagnosis not present

## 2020-05-21 DIAGNOSIS — D122 Benign neoplasm of ascending colon: Secondary | ICD-10-CM | POA: Diagnosis not present

## 2020-05-21 DIAGNOSIS — Z1211 Encounter for screening for malignant neoplasm of colon: Secondary | ICD-10-CM | POA: Diagnosis not present

## 2020-05-21 DIAGNOSIS — K219 Gastro-esophageal reflux disease without esophagitis: Secondary | ICD-10-CM | POA: Diagnosis not present

## 2020-06-03 DIAGNOSIS — G14 Postpolio syndrome: Secondary | ICD-10-CM | POA: Diagnosis not present

## 2020-06-03 DIAGNOSIS — M810 Age-related osteoporosis without current pathological fracture: Secondary | ICD-10-CM | POA: Diagnosis not present

## 2020-06-03 DIAGNOSIS — M47816 Spondylosis without myelopathy or radiculopathy, lumbar region: Secondary | ICD-10-CM | POA: Diagnosis not present

## 2020-06-03 DIAGNOSIS — M25511 Pain in right shoulder: Secondary | ICD-10-CM | POA: Diagnosis not present

## 2020-06-03 DIAGNOSIS — I10 Essential (primary) hypertension: Secondary | ICD-10-CM | POA: Diagnosis not present

## 2020-06-03 DIAGNOSIS — E039 Hypothyroidism, unspecified: Secondary | ICD-10-CM | POA: Diagnosis not present

## 2020-06-03 DIAGNOSIS — E119 Type 2 diabetes mellitus without complications: Secondary | ICD-10-CM | POA: Diagnosis not present

## 2020-06-03 DIAGNOSIS — Z7984 Long term (current) use of oral hypoglycemic drugs: Secondary | ICD-10-CM | POA: Diagnosis not present

## 2020-06-03 DIAGNOSIS — M519 Unspecified thoracic, thoracolumbar and lumbosacral intervertebral disc disorder: Secondary | ICD-10-CM | POA: Diagnosis not present

## 2020-06-07 DIAGNOSIS — M810 Age-related osteoporosis without current pathological fracture: Secondary | ICD-10-CM | POA: Diagnosis not present

## 2020-06-08 DIAGNOSIS — E119 Type 2 diabetes mellitus without complications: Secondary | ICD-10-CM | POA: Diagnosis not present

## 2020-06-08 DIAGNOSIS — M519 Unspecified thoracic, thoracolumbar and lumbosacral intervertebral disc disorder: Secondary | ICD-10-CM | POA: Diagnosis not present

## 2020-06-08 DIAGNOSIS — M47816 Spondylosis without myelopathy or radiculopathy, lumbar region: Secondary | ICD-10-CM | POA: Diagnosis not present

## 2020-06-08 DIAGNOSIS — G14 Postpolio syndrome: Secondary | ICD-10-CM | POA: Diagnosis not present

## 2020-06-08 DIAGNOSIS — Z7984 Long term (current) use of oral hypoglycemic drugs: Secondary | ICD-10-CM | POA: Diagnosis not present

## 2020-06-08 DIAGNOSIS — I10 Essential (primary) hypertension: Secondary | ICD-10-CM | POA: Diagnosis not present

## 2020-06-08 DIAGNOSIS — E039 Hypothyroidism, unspecified: Secondary | ICD-10-CM | POA: Diagnosis not present

## 2020-06-08 DIAGNOSIS — M810 Age-related osteoporosis without current pathological fracture: Secondary | ICD-10-CM | POA: Diagnosis not present

## 2020-06-08 DIAGNOSIS — M25511 Pain in right shoulder: Secondary | ICD-10-CM | POA: Diagnosis not present

## 2020-06-11 DIAGNOSIS — M47816 Spondylosis without myelopathy or radiculopathy, lumbar region: Secondary | ICD-10-CM | POA: Diagnosis not present

## 2020-06-11 DIAGNOSIS — I10 Essential (primary) hypertension: Secondary | ICD-10-CM | POA: Diagnosis not present

## 2020-06-11 DIAGNOSIS — M25511 Pain in right shoulder: Secondary | ICD-10-CM | POA: Diagnosis not present

## 2020-06-11 DIAGNOSIS — M810 Age-related osteoporosis without current pathological fracture: Secondary | ICD-10-CM | POA: Diagnosis not present

## 2020-06-11 DIAGNOSIS — E119 Type 2 diabetes mellitus without complications: Secondary | ICD-10-CM | POA: Diagnosis not present

## 2020-06-11 DIAGNOSIS — Z7984 Long term (current) use of oral hypoglycemic drugs: Secondary | ICD-10-CM | POA: Diagnosis not present

## 2020-06-11 DIAGNOSIS — M519 Unspecified thoracic, thoracolumbar and lumbosacral intervertebral disc disorder: Secondary | ICD-10-CM | POA: Diagnosis not present

## 2020-06-11 DIAGNOSIS — E039 Hypothyroidism, unspecified: Secondary | ICD-10-CM | POA: Diagnosis not present

## 2020-06-11 DIAGNOSIS — G14 Postpolio syndrome: Secondary | ICD-10-CM | POA: Diagnosis not present

## 2020-06-14 ENCOUNTER — Other Ambulatory Visit: Payer: Self-pay | Admitting: Obstetrics and Gynecology

## 2020-06-14 DIAGNOSIS — Z1231 Encounter for screening mammogram for malignant neoplasm of breast: Secondary | ICD-10-CM

## 2020-06-15 DIAGNOSIS — G14 Postpolio syndrome: Secondary | ICD-10-CM | POA: Diagnosis not present

## 2020-06-15 DIAGNOSIS — E119 Type 2 diabetes mellitus without complications: Secondary | ICD-10-CM | POA: Diagnosis not present

## 2020-06-15 DIAGNOSIS — M47816 Spondylosis without myelopathy or radiculopathy, lumbar region: Secondary | ICD-10-CM | POA: Diagnosis not present

## 2020-06-15 DIAGNOSIS — Z7984 Long term (current) use of oral hypoglycemic drugs: Secondary | ICD-10-CM | POA: Diagnosis not present

## 2020-06-15 DIAGNOSIS — M519 Unspecified thoracic, thoracolumbar and lumbosacral intervertebral disc disorder: Secondary | ICD-10-CM | POA: Diagnosis not present

## 2020-06-15 DIAGNOSIS — E039 Hypothyroidism, unspecified: Secondary | ICD-10-CM | POA: Diagnosis not present

## 2020-06-15 DIAGNOSIS — I10 Essential (primary) hypertension: Secondary | ICD-10-CM | POA: Diagnosis not present

## 2020-06-15 DIAGNOSIS — M810 Age-related osteoporosis without current pathological fracture: Secondary | ICD-10-CM | POA: Diagnosis not present

## 2020-06-15 DIAGNOSIS — M25511 Pain in right shoulder: Secondary | ICD-10-CM | POA: Diagnosis not present

## 2020-06-21 DIAGNOSIS — I10 Essential (primary) hypertension: Secondary | ICD-10-CM | POA: Diagnosis not present

## 2020-06-21 DIAGNOSIS — M25511 Pain in right shoulder: Secondary | ICD-10-CM | POA: Diagnosis not present

## 2020-06-21 DIAGNOSIS — E039 Hypothyroidism, unspecified: Secondary | ICD-10-CM | POA: Diagnosis not present

## 2020-06-21 DIAGNOSIS — E119 Type 2 diabetes mellitus without complications: Secondary | ICD-10-CM | POA: Diagnosis not present

## 2020-06-21 DIAGNOSIS — G14 Postpolio syndrome: Secondary | ICD-10-CM | POA: Diagnosis not present

## 2020-06-21 DIAGNOSIS — M519 Unspecified thoracic, thoracolumbar and lumbosacral intervertebral disc disorder: Secondary | ICD-10-CM | POA: Diagnosis not present

## 2020-06-21 DIAGNOSIS — M810 Age-related osteoporosis without current pathological fracture: Secondary | ICD-10-CM | POA: Diagnosis not present

## 2020-06-21 DIAGNOSIS — M47816 Spondylosis without myelopathy or radiculopathy, lumbar region: Secondary | ICD-10-CM | POA: Diagnosis not present

## 2020-06-21 DIAGNOSIS — Z7984 Long term (current) use of oral hypoglycemic drugs: Secondary | ICD-10-CM | POA: Diagnosis not present

## 2020-06-24 DIAGNOSIS — G14 Postpolio syndrome: Secondary | ICD-10-CM | POA: Diagnosis not present

## 2020-06-24 DIAGNOSIS — M25511 Pain in right shoulder: Secondary | ICD-10-CM | POA: Diagnosis not present

## 2020-06-24 DIAGNOSIS — E119 Type 2 diabetes mellitus without complications: Secondary | ICD-10-CM | POA: Diagnosis not present

## 2020-06-24 DIAGNOSIS — E039 Hypothyroidism, unspecified: Secondary | ICD-10-CM | POA: Diagnosis not present

## 2020-06-24 DIAGNOSIS — M47816 Spondylosis without myelopathy or radiculopathy, lumbar region: Secondary | ICD-10-CM | POA: Diagnosis not present

## 2020-06-24 DIAGNOSIS — M810 Age-related osteoporosis without current pathological fracture: Secondary | ICD-10-CM | POA: Diagnosis not present

## 2020-06-24 DIAGNOSIS — M519 Unspecified thoracic, thoracolumbar and lumbosacral intervertebral disc disorder: Secondary | ICD-10-CM | POA: Diagnosis not present

## 2020-06-24 DIAGNOSIS — I10 Essential (primary) hypertension: Secondary | ICD-10-CM | POA: Diagnosis not present

## 2020-06-24 DIAGNOSIS — Z7984 Long term (current) use of oral hypoglycemic drugs: Secondary | ICD-10-CM | POA: Diagnosis not present

## 2020-06-29 DIAGNOSIS — M25511 Pain in right shoulder: Secondary | ICD-10-CM | POA: Diagnosis not present

## 2020-06-29 DIAGNOSIS — Z7984 Long term (current) use of oral hypoglycemic drugs: Secondary | ICD-10-CM | POA: Diagnosis not present

## 2020-06-29 DIAGNOSIS — G14 Postpolio syndrome: Secondary | ICD-10-CM | POA: Diagnosis not present

## 2020-06-29 DIAGNOSIS — M47816 Spondylosis without myelopathy or radiculopathy, lumbar region: Secondary | ICD-10-CM | POA: Diagnosis not present

## 2020-06-29 DIAGNOSIS — E119 Type 2 diabetes mellitus without complications: Secondary | ICD-10-CM | POA: Diagnosis not present

## 2020-06-29 DIAGNOSIS — M519 Unspecified thoracic, thoracolumbar and lumbosacral intervertebral disc disorder: Secondary | ICD-10-CM | POA: Diagnosis not present

## 2020-06-29 DIAGNOSIS — M810 Age-related osteoporosis without current pathological fracture: Secondary | ICD-10-CM | POA: Diagnosis not present

## 2020-06-29 DIAGNOSIS — E039 Hypothyroidism, unspecified: Secondary | ICD-10-CM | POA: Diagnosis not present

## 2020-06-29 DIAGNOSIS — I10 Essential (primary) hypertension: Secondary | ICD-10-CM | POA: Diagnosis not present

## 2020-06-30 DIAGNOSIS — E039 Hypothyroidism, unspecified: Secondary | ICD-10-CM | POA: Diagnosis not present

## 2020-06-30 DIAGNOSIS — M519 Unspecified thoracic, thoracolumbar and lumbosacral intervertebral disc disorder: Secondary | ICD-10-CM | POA: Diagnosis not present

## 2020-06-30 DIAGNOSIS — G14 Postpolio syndrome: Secondary | ICD-10-CM | POA: Diagnosis not present

## 2020-06-30 DIAGNOSIS — Z7984 Long term (current) use of oral hypoglycemic drugs: Secondary | ICD-10-CM | POA: Diagnosis not present

## 2020-06-30 DIAGNOSIS — I10 Essential (primary) hypertension: Secondary | ICD-10-CM | POA: Diagnosis not present

## 2020-06-30 DIAGNOSIS — E119 Type 2 diabetes mellitus without complications: Secondary | ICD-10-CM | POA: Diagnosis not present

## 2020-06-30 DIAGNOSIS — M25511 Pain in right shoulder: Secondary | ICD-10-CM | POA: Diagnosis not present

## 2020-06-30 DIAGNOSIS — M47816 Spondylosis without myelopathy or radiculopathy, lumbar region: Secondary | ICD-10-CM | POA: Diagnosis not present

## 2020-06-30 DIAGNOSIS — M810 Age-related osteoporosis without current pathological fracture: Secondary | ICD-10-CM | POA: Diagnosis not present

## 2020-07-01 DIAGNOSIS — I10 Essential (primary) hypertension: Secondary | ICD-10-CM | POA: Diagnosis not present

## 2020-07-01 DIAGNOSIS — R609 Edema, unspecified: Secondary | ICD-10-CM | POA: Diagnosis not present

## 2020-07-01 DIAGNOSIS — E7800 Pure hypercholesterolemia, unspecified: Secondary | ICD-10-CM | POA: Diagnosis not present

## 2020-07-01 DIAGNOSIS — Z8679 Personal history of other diseases of the circulatory system: Secondary | ICD-10-CM | POA: Diagnosis not present

## 2020-07-01 DIAGNOSIS — Z0000 Encounter for general adult medical examination without abnormal findings: Secondary | ICD-10-CM | POA: Diagnosis not present

## 2020-07-01 DIAGNOSIS — E871 Hypo-osmolality and hyponatremia: Secondary | ICD-10-CM | POA: Diagnosis not present

## 2020-07-01 DIAGNOSIS — K219 Gastro-esophageal reflux disease without esophagitis: Secondary | ICD-10-CM | POA: Diagnosis not present

## 2020-07-01 DIAGNOSIS — E559 Vitamin D deficiency, unspecified: Secondary | ICD-10-CM | POA: Diagnosis not present

## 2020-07-01 DIAGNOSIS — E785 Hyperlipidemia, unspecified: Secondary | ICD-10-CM | POA: Diagnosis not present

## 2020-07-01 DIAGNOSIS — E039 Hypothyroidism, unspecified: Secondary | ICD-10-CM | POA: Diagnosis not present

## 2020-07-01 DIAGNOSIS — R262 Difficulty in walking, not elsewhere classified: Secondary | ICD-10-CM | POA: Diagnosis not present

## 2020-07-01 DIAGNOSIS — M8580 Other specified disorders of bone density and structure, unspecified site: Secondary | ICD-10-CM | POA: Diagnosis not present

## 2020-07-01 DIAGNOSIS — E119 Type 2 diabetes mellitus without complications: Secondary | ICD-10-CM | POA: Diagnosis not present

## 2020-07-01 DIAGNOSIS — G14 Postpolio syndrome: Secondary | ICD-10-CM | POA: Diagnosis not present

## 2020-07-03 DIAGNOSIS — Z7984 Long term (current) use of oral hypoglycemic drugs: Secondary | ICD-10-CM | POA: Diagnosis not present

## 2020-07-03 DIAGNOSIS — I10 Essential (primary) hypertension: Secondary | ICD-10-CM | POA: Diagnosis not present

## 2020-07-03 DIAGNOSIS — M519 Unspecified thoracic, thoracolumbar and lumbosacral intervertebral disc disorder: Secondary | ICD-10-CM | POA: Diagnosis not present

## 2020-07-03 DIAGNOSIS — M25511 Pain in right shoulder: Secondary | ICD-10-CM | POA: Diagnosis not present

## 2020-07-03 DIAGNOSIS — E039 Hypothyroidism, unspecified: Secondary | ICD-10-CM | POA: Diagnosis not present

## 2020-07-03 DIAGNOSIS — M47816 Spondylosis without myelopathy or radiculopathy, lumbar region: Secondary | ICD-10-CM | POA: Diagnosis not present

## 2020-07-03 DIAGNOSIS — G14 Postpolio syndrome: Secondary | ICD-10-CM | POA: Diagnosis not present

## 2020-07-03 DIAGNOSIS — M810 Age-related osteoporosis without current pathological fracture: Secondary | ICD-10-CM | POA: Diagnosis not present

## 2020-07-03 DIAGNOSIS — E119 Type 2 diabetes mellitus without complications: Secondary | ICD-10-CM | POA: Diagnosis not present

## 2020-07-07 DIAGNOSIS — E039 Hypothyroidism, unspecified: Secondary | ICD-10-CM | POA: Diagnosis not present

## 2020-07-07 DIAGNOSIS — M47816 Spondylosis without myelopathy or radiculopathy, lumbar region: Secondary | ICD-10-CM | POA: Diagnosis not present

## 2020-07-07 DIAGNOSIS — N2889 Other specified disorders of kidney and ureter: Secondary | ICD-10-CM | POA: Diagnosis not present

## 2020-07-07 DIAGNOSIS — G14 Postpolio syndrome: Secondary | ICD-10-CM | POA: Diagnosis not present

## 2020-07-07 DIAGNOSIS — M810 Age-related osteoporosis without current pathological fracture: Secondary | ICD-10-CM | POA: Diagnosis not present

## 2020-07-07 DIAGNOSIS — M519 Unspecified thoracic, thoracolumbar and lumbosacral intervertebral disc disorder: Secondary | ICD-10-CM | POA: Diagnosis not present

## 2020-07-07 DIAGNOSIS — I10 Essential (primary) hypertension: Secondary | ICD-10-CM | POA: Diagnosis not present

## 2020-07-07 DIAGNOSIS — Z7984 Long term (current) use of oral hypoglycemic drugs: Secondary | ICD-10-CM | POA: Diagnosis not present

## 2020-07-07 DIAGNOSIS — E119 Type 2 diabetes mellitus without complications: Secondary | ICD-10-CM | POA: Diagnosis not present

## 2020-07-07 DIAGNOSIS — M25511 Pain in right shoulder: Secondary | ICD-10-CM | POA: Diagnosis not present

## 2020-07-08 DIAGNOSIS — E039 Hypothyroidism, unspecified: Secondary | ICD-10-CM | POA: Diagnosis not present

## 2020-07-08 DIAGNOSIS — M47816 Spondylosis without myelopathy or radiculopathy, lumbar region: Secondary | ICD-10-CM | POA: Diagnosis not present

## 2020-07-08 DIAGNOSIS — I10 Essential (primary) hypertension: Secondary | ICD-10-CM | POA: Diagnosis not present

## 2020-07-08 DIAGNOSIS — G14 Postpolio syndrome: Secondary | ICD-10-CM | POA: Diagnosis not present

## 2020-07-08 DIAGNOSIS — Z7984 Long term (current) use of oral hypoglycemic drugs: Secondary | ICD-10-CM | POA: Diagnosis not present

## 2020-07-08 DIAGNOSIS — M519 Unspecified thoracic, thoracolumbar and lumbosacral intervertebral disc disorder: Secondary | ICD-10-CM | POA: Diagnosis not present

## 2020-07-08 DIAGNOSIS — M810 Age-related osteoporosis without current pathological fracture: Secondary | ICD-10-CM | POA: Diagnosis not present

## 2020-07-08 DIAGNOSIS — M25511 Pain in right shoulder: Secondary | ICD-10-CM | POA: Diagnosis not present

## 2020-07-08 DIAGNOSIS — E119 Type 2 diabetes mellitus without complications: Secondary | ICD-10-CM | POA: Diagnosis not present

## 2020-07-11 DIAGNOSIS — Z7984 Long term (current) use of oral hypoglycemic drugs: Secondary | ICD-10-CM | POA: Diagnosis not present

## 2020-07-11 DIAGNOSIS — M47816 Spondylosis without myelopathy or radiculopathy, lumbar region: Secondary | ICD-10-CM | POA: Diagnosis not present

## 2020-07-11 DIAGNOSIS — M25511 Pain in right shoulder: Secondary | ICD-10-CM | POA: Diagnosis not present

## 2020-07-11 DIAGNOSIS — M810 Age-related osteoporosis without current pathological fracture: Secondary | ICD-10-CM | POA: Diagnosis not present

## 2020-07-11 DIAGNOSIS — E119 Type 2 diabetes mellitus without complications: Secondary | ICD-10-CM | POA: Diagnosis not present

## 2020-07-11 DIAGNOSIS — G14 Postpolio syndrome: Secondary | ICD-10-CM | POA: Diagnosis not present

## 2020-07-11 DIAGNOSIS — M519 Unspecified thoracic, thoracolumbar and lumbosacral intervertebral disc disorder: Secondary | ICD-10-CM | POA: Diagnosis not present

## 2020-07-11 DIAGNOSIS — E039 Hypothyroidism, unspecified: Secondary | ICD-10-CM | POA: Diagnosis not present

## 2020-07-11 DIAGNOSIS — I10 Essential (primary) hypertension: Secondary | ICD-10-CM | POA: Diagnosis not present

## 2020-07-12 DIAGNOSIS — M25511 Pain in right shoulder: Secondary | ICD-10-CM | POA: Diagnosis not present

## 2020-07-12 DIAGNOSIS — E119 Type 2 diabetes mellitus without complications: Secondary | ICD-10-CM | POA: Diagnosis not present

## 2020-07-12 DIAGNOSIS — I10 Essential (primary) hypertension: Secondary | ICD-10-CM | POA: Diagnosis not present

## 2020-07-12 DIAGNOSIS — G14 Postpolio syndrome: Secondary | ICD-10-CM | POA: Diagnosis not present

## 2020-07-12 DIAGNOSIS — E039 Hypothyroidism, unspecified: Secondary | ICD-10-CM | POA: Diagnosis not present

## 2020-07-12 DIAGNOSIS — M519 Unspecified thoracic, thoracolumbar and lumbosacral intervertebral disc disorder: Secondary | ICD-10-CM | POA: Diagnosis not present

## 2020-07-12 DIAGNOSIS — M47816 Spondylosis without myelopathy or radiculopathy, lumbar region: Secondary | ICD-10-CM | POA: Diagnosis not present

## 2020-07-12 DIAGNOSIS — M810 Age-related osteoporosis without current pathological fracture: Secondary | ICD-10-CM | POA: Diagnosis not present

## 2020-07-12 DIAGNOSIS — Z7984 Long term (current) use of oral hypoglycemic drugs: Secondary | ICD-10-CM | POA: Diagnosis not present

## 2020-07-13 DIAGNOSIS — G14 Postpolio syndrome: Secondary | ICD-10-CM | POA: Diagnosis not present

## 2020-07-13 DIAGNOSIS — M519 Unspecified thoracic, thoracolumbar and lumbosacral intervertebral disc disorder: Secondary | ICD-10-CM | POA: Diagnosis not present

## 2020-07-13 DIAGNOSIS — M25511 Pain in right shoulder: Secondary | ICD-10-CM | POA: Diagnosis not present

## 2020-07-13 DIAGNOSIS — E119 Type 2 diabetes mellitus without complications: Secondary | ICD-10-CM | POA: Diagnosis not present

## 2020-07-13 DIAGNOSIS — I10 Essential (primary) hypertension: Secondary | ICD-10-CM | POA: Diagnosis not present

## 2020-07-13 DIAGNOSIS — M47816 Spondylosis without myelopathy or radiculopathy, lumbar region: Secondary | ICD-10-CM | POA: Diagnosis not present

## 2020-07-13 DIAGNOSIS — Z7984 Long term (current) use of oral hypoglycemic drugs: Secondary | ICD-10-CM | POA: Diagnosis not present

## 2020-07-13 DIAGNOSIS — E039 Hypothyroidism, unspecified: Secondary | ICD-10-CM | POA: Diagnosis not present

## 2020-07-13 DIAGNOSIS — M810 Age-related osteoporosis without current pathological fracture: Secondary | ICD-10-CM | POA: Diagnosis not present

## 2020-07-19 DIAGNOSIS — M519 Unspecified thoracic, thoracolumbar and lumbosacral intervertebral disc disorder: Secondary | ICD-10-CM | POA: Diagnosis not present

## 2020-07-19 DIAGNOSIS — M25511 Pain in right shoulder: Secondary | ICD-10-CM | POA: Diagnosis not present

## 2020-07-19 DIAGNOSIS — Z7984 Long term (current) use of oral hypoglycemic drugs: Secondary | ICD-10-CM | POA: Diagnosis not present

## 2020-07-19 DIAGNOSIS — G14 Postpolio syndrome: Secondary | ICD-10-CM | POA: Diagnosis not present

## 2020-07-19 DIAGNOSIS — E119 Type 2 diabetes mellitus without complications: Secondary | ICD-10-CM | POA: Diagnosis not present

## 2020-07-19 DIAGNOSIS — M47816 Spondylosis without myelopathy or radiculopathy, lumbar region: Secondary | ICD-10-CM | POA: Diagnosis not present

## 2020-07-19 DIAGNOSIS — I10 Essential (primary) hypertension: Secondary | ICD-10-CM | POA: Diagnosis not present

## 2020-07-19 DIAGNOSIS — E039 Hypothyroidism, unspecified: Secondary | ICD-10-CM | POA: Diagnosis not present

## 2020-07-19 DIAGNOSIS — M810 Age-related osteoporosis without current pathological fracture: Secondary | ICD-10-CM | POA: Diagnosis not present

## 2020-07-22 DIAGNOSIS — F329 Major depressive disorder, single episode, unspecified: Secondary | ICD-10-CM | POA: Diagnosis not present

## 2020-07-22 DIAGNOSIS — E119 Type 2 diabetes mellitus without complications: Secondary | ICD-10-CM | POA: Diagnosis not present

## 2020-07-27 DIAGNOSIS — M25572 Pain in left ankle and joints of left foot: Secondary | ICD-10-CM | POA: Diagnosis not present

## 2020-07-28 ENCOUNTER — Inpatient Hospital Stay: Admission: RE | Admit: 2020-07-28 | Payer: Medicare Other | Source: Ambulatory Visit

## 2020-07-28 DIAGNOSIS — Z7984 Long term (current) use of oral hypoglycemic drugs: Secondary | ICD-10-CM | POA: Diagnosis not present

## 2020-07-28 DIAGNOSIS — E039 Hypothyroidism, unspecified: Secondary | ICD-10-CM | POA: Diagnosis not present

## 2020-07-28 DIAGNOSIS — E119 Type 2 diabetes mellitus without complications: Secondary | ICD-10-CM | POA: Diagnosis not present

## 2020-07-28 DIAGNOSIS — M47816 Spondylosis without myelopathy or radiculopathy, lumbar region: Secondary | ICD-10-CM | POA: Diagnosis not present

## 2020-07-28 DIAGNOSIS — I10 Essential (primary) hypertension: Secondary | ICD-10-CM | POA: Diagnosis not present

## 2020-07-28 DIAGNOSIS — G14 Postpolio syndrome: Secondary | ICD-10-CM | POA: Diagnosis not present

## 2020-07-28 DIAGNOSIS — M25511 Pain in right shoulder: Secondary | ICD-10-CM | POA: Diagnosis not present

## 2020-07-28 DIAGNOSIS — M810 Age-related osteoporosis without current pathological fracture: Secondary | ICD-10-CM | POA: Diagnosis not present

## 2020-07-28 DIAGNOSIS — M519 Unspecified thoracic, thoracolumbar and lumbosacral intervertebral disc disorder: Secondary | ICD-10-CM | POA: Diagnosis not present

## 2020-07-29 DIAGNOSIS — M519 Unspecified thoracic, thoracolumbar and lumbosacral intervertebral disc disorder: Secondary | ICD-10-CM | POA: Diagnosis not present

## 2020-07-29 DIAGNOSIS — I10 Essential (primary) hypertension: Secondary | ICD-10-CM | POA: Diagnosis not present

## 2020-07-29 DIAGNOSIS — M47816 Spondylosis without myelopathy or radiculopathy, lumbar region: Secondary | ICD-10-CM | POA: Diagnosis not present

## 2020-07-29 DIAGNOSIS — Z7984 Long term (current) use of oral hypoglycemic drugs: Secondary | ICD-10-CM | POA: Diagnosis not present

## 2020-07-29 DIAGNOSIS — E119 Type 2 diabetes mellitus without complications: Secondary | ICD-10-CM | POA: Diagnosis not present

## 2020-07-29 DIAGNOSIS — M810 Age-related osteoporosis without current pathological fracture: Secondary | ICD-10-CM | POA: Diagnosis not present

## 2020-07-29 DIAGNOSIS — G14 Postpolio syndrome: Secondary | ICD-10-CM | POA: Diagnosis not present

## 2020-07-29 DIAGNOSIS — M25511 Pain in right shoulder: Secondary | ICD-10-CM | POA: Diagnosis not present

## 2020-07-29 DIAGNOSIS — E039 Hypothyroidism, unspecified: Secondary | ICD-10-CM | POA: Diagnosis not present

## 2020-07-30 ENCOUNTER — Ambulatory Visit
Admission: RE | Admit: 2020-07-30 | Discharge: 2020-07-30 | Disposition: A | Discharge status: Home / Self Care | Payer: Medicare HMO | Source: Ambulatory Visit | Attending: Obstetrics and Gynecology | Admitting: Obstetrics and Gynecology

## 2020-07-30 ENCOUNTER — Other Ambulatory Visit: Payer: Self-pay

## 2020-07-30 DIAGNOSIS — E119 Type 2 diabetes mellitus without complications: Secondary | ICD-10-CM | POA: Diagnosis not present

## 2020-07-30 DIAGNOSIS — Z1231 Encounter for screening mammogram for malignant neoplasm of breast: Secondary | ICD-10-CM | POA: Diagnosis not present

## 2020-07-30 DIAGNOSIS — M810 Age-related osteoporosis without current pathological fracture: Secondary | ICD-10-CM | POA: Diagnosis not present

## 2020-07-30 DIAGNOSIS — Z7984 Long term (current) use of oral hypoglycemic drugs: Secondary | ICD-10-CM | POA: Diagnosis not present

## 2020-07-30 DIAGNOSIS — M25511 Pain in right shoulder: Secondary | ICD-10-CM | POA: Diagnosis not present

## 2020-07-30 DIAGNOSIS — I10 Essential (primary) hypertension: Secondary | ICD-10-CM | POA: Diagnosis not present

## 2020-07-30 DIAGNOSIS — G14 Postpolio syndrome: Secondary | ICD-10-CM | POA: Diagnosis not present

## 2020-07-30 DIAGNOSIS — M47816 Spondylosis without myelopathy or radiculopathy, lumbar region: Secondary | ICD-10-CM | POA: Diagnosis not present

## 2020-07-30 DIAGNOSIS — E039 Hypothyroidism, unspecified: Secondary | ICD-10-CM | POA: Diagnosis not present

## 2020-07-30 DIAGNOSIS — M519 Unspecified thoracic, thoracolumbar and lumbosacral intervertebral disc disorder: Secondary | ICD-10-CM | POA: Diagnosis not present

## 2020-08-02 DIAGNOSIS — Z7984 Long term (current) use of oral hypoglycemic drugs: Secondary | ICD-10-CM | POA: Diagnosis not present

## 2020-08-02 DIAGNOSIS — E039 Hypothyroidism, unspecified: Secondary | ICD-10-CM | POA: Diagnosis not present

## 2020-08-02 DIAGNOSIS — M25511 Pain in right shoulder: Secondary | ICD-10-CM | POA: Diagnosis not present

## 2020-08-02 DIAGNOSIS — I10 Essential (primary) hypertension: Secondary | ICD-10-CM | POA: Diagnosis not present

## 2020-08-02 DIAGNOSIS — M519 Unspecified thoracic, thoracolumbar and lumbosacral intervertebral disc disorder: Secondary | ICD-10-CM | POA: Diagnosis not present

## 2020-08-02 DIAGNOSIS — M810 Age-related osteoporosis without current pathological fracture: Secondary | ICD-10-CM | POA: Diagnosis not present

## 2020-08-02 DIAGNOSIS — M47816 Spondylosis without myelopathy or radiculopathy, lumbar region: Secondary | ICD-10-CM | POA: Diagnosis not present

## 2020-08-02 DIAGNOSIS — G14 Postpolio syndrome: Secondary | ICD-10-CM | POA: Diagnosis not present

## 2020-08-02 DIAGNOSIS — E119 Type 2 diabetes mellitus without complications: Secondary | ICD-10-CM | POA: Diagnosis not present

## 2020-08-04 DIAGNOSIS — E119 Type 2 diabetes mellitus without complications: Secondary | ICD-10-CM | POA: Diagnosis not present

## 2020-08-04 DIAGNOSIS — I10 Essential (primary) hypertension: Secondary | ICD-10-CM | POA: Diagnosis not present

## 2020-08-04 DIAGNOSIS — G14 Postpolio syndrome: Secondary | ICD-10-CM | POA: Diagnosis not present

## 2020-08-04 DIAGNOSIS — M810 Age-related osteoporosis without current pathological fracture: Secondary | ICD-10-CM | POA: Diagnosis not present

## 2020-08-04 DIAGNOSIS — Z7984 Long term (current) use of oral hypoglycemic drugs: Secondary | ICD-10-CM | POA: Diagnosis not present

## 2020-08-04 DIAGNOSIS — M47816 Spondylosis without myelopathy or radiculopathy, lumbar region: Secondary | ICD-10-CM | POA: Diagnosis not present

## 2020-08-04 DIAGNOSIS — M25511 Pain in right shoulder: Secondary | ICD-10-CM | POA: Diagnosis not present

## 2020-08-04 DIAGNOSIS — E039 Hypothyroidism, unspecified: Secondary | ICD-10-CM | POA: Diagnosis not present

## 2020-08-04 DIAGNOSIS — M519 Unspecified thoracic, thoracolumbar and lumbosacral intervertebral disc disorder: Secondary | ICD-10-CM | POA: Diagnosis not present

## 2020-08-05 DIAGNOSIS — I10 Essential (primary) hypertension: Secondary | ICD-10-CM | POA: Diagnosis not present

## 2020-08-05 DIAGNOSIS — G14 Postpolio syndrome: Secondary | ICD-10-CM | POA: Diagnosis not present

## 2020-08-05 DIAGNOSIS — M47816 Spondylosis without myelopathy or radiculopathy, lumbar region: Secondary | ICD-10-CM | POA: Diagnosis not present

## 2020-08-05 DIAGNOSIS — E039 Hypothyroidism, unspecified: Secondary | ICD-10-CM | POA: Diagnosis not present

## 2020-08-05 DIAGNOSIS — E119 Type 2 diabetes mellitus without complications: Secondary | ICD-10-CM | POA: Diagnosis not present

## 2020-08-05 DIAGNOSIS — M519 Unspecified thoracic, thoracolumbar and lumbosacral intervertebral disc disorder: Secondary | ICD-10-CM | POA: Diagnosis not present

## 2020-08-05 DIAGNOSIS — Z7984 Long term (current) use of oral hypoglycemic drugs: Secondary | ICD-10-CM | POA: Diagnosis not present

## 2020-08-05 DIAGNOSIS — M25511 Pain in right shoulder: Secondary | ICD-10-CM | POA: Diagnosis not present

## 2020-08-05 DIAGNOSIS — M810 Age-related osteoporosis without current pathological fracture: Secondary | ICD-10-CM | POA: Diagnosis not present

## 2020-08-09 DIAGNOSIS — I10 Essential (primary) hypertension: Secondary | ICD-10-CM | POA: Diagnosis not present

## 2020-08-09 DIAGNOSIS — M25511 Pain in right shoulder: Secondary | ICD-10-CM | POA: Diagnosis not present

## 2020-08-09 DIAGNOSIS — Z7984 Long term (current) use of oral hypoglycemic drugs: Secondary | ICD-10-CM | POA: Diagnosis not present

## 2020-08-09 DIAGNOSIS — M519 Unspecified thoracic, thoracolumbar and lumbosacral intervertebral disc disorder: Secondary | ICD-10-CM | POA: Diagnosis not present

## 2020-08-09 DIAGNOSIS — G14 Postpolio syndrome: Secondary | ICD-10-CM | POA: Diagnosis not present

## 2020-08-09 DIAGNOSIS — E039 Hypothyroidism, unspecified: Secondary | ICD-10-CM | POA: Diagnosis not present

## 2020-08-09 DIAGNOSIS — M810 Age-related osteoporosis without current pathological fracture: Secondary | ICD-10-CM | POA: Diagnosis not present

## 2020-08-09 DIAGNOSIS — E119 Type 2 diabetes mellitus without complications: Secondary | ICD-10-CM | POA: Diagnosis not present

## 2020-08-09 DIAGNOSIS — M47816 Spondylosis without myelopathy or radiculopathy, lumbar region: Secondary | ICD-10-CM | POA: Diagnosis not present

## 2020-08-11 ENCOUNTER — Other Ambulatory Visit: Payer: Self-pay | Admitting: Cardiovascular Disease

## 2020-08-11 DIAGNOSIS — E039 Hypothyroidism, unspecified: Secondary | ICD-10-CM | POA: Diagnosis not present

## 2020-08-11 DIAGNOSIS — M25511 Pain in right shoulder: Secondary | ICD-10-CM | POA: Diagnosis not present

## 2020-08-11 DIAGNOSIS — I10 Essential (primary) hypertension: Secondary | ICD-10-CM | POA: Diagnosis not present

## 2020-08-11 DIAGNOSIS — E119 Type 2 diabetes mellitus without complications: Secondary | ICD-10-CM | POA: Diagnosis not present

## 2020-08-11 DIAGNOSIS — M519 Unspecified thoracic, thoracolumbar and lumbosacral intervertebral disc disorder: Secondary | ICD-10-CM | POA: Diagnosis not present

## 2020-08-11 DIAGNOSIS — M47816 Spondylosis without myelopathy or radiculopathy, lumbar region: Secondary | ICD-10-CM | POA: Diagnosis not present

## 2020-08-11 DIAGNOSIS — Z7984 Long term (current) use of oral hypoglycemic drugs: Secondary | ICD-10-CM | POA: Diagnosis not present

## 2020-08-11 DIAGNOSIS — G14 Postpolio syndrome: Secondary | ICD-10-CM | POA: Diagnosis not present

## 2020-08-11 DIAGNOSIS — M810 Age-related osteoporosis without current pathological fracture: Secondary | ICD-10-CM | POA: Diagnosis not present

## 2020-08-12 DIAGNOSIS — N3020 Other chronic cystitis without hematuria: Secondary | ICD-10-CM | POA: Diagnosis not present

## 2020-08-12 DIAGNOSIS — N261 Atrophy of kidney (terminal): Secondary | ICD-10-CM | POA: Diagnosis not present

## 2020-08-12 DIAGNOSIS — G8220 Paraplegia, unspecified: Secondary | ICD-10-CM | POA: Diagnosis not present

## 2020-08-12 DIAGNOSIS — N3941 Urge incontinence: Secondary | ICD-10-CM | POA: Diagnosis not present

## 2020-08-12 DIAGNOSIS — N319 Neuromuscular dysfunction of bladder, unspecified: Secondary | ICD-10-CM | POA: Diagnosis not present

## 2020-08-16 DIAGNOSIS — E039 Hypothyroidism, unspecified: Secondary | ICD-10-CM | POA: Diagnosis not present

## 2020-08-16 DIAGNOSIS — M25511 Pain in right shoulder: Secondary | ICD-10-CM | POA: Diagnosis not present

## 2020-08-16 DIAGNOSIS — M47816 Spondylosis without myelopathy or radiculopathy, lumbar region: Secondary | ICD-10-CM | POA: Diagnosis not present

## 2020-08-16 DIAGNOSIS — E119 Type 2 diabetes mellitus without complications: Secondary | ICD-10-CM | POA: Diagnosis not present

## 2020-08-16 DIAGNOSIS — M810 Age-related osteoporosis without current pathological fracture: Secondary | ICD-10-CM | POA: Diagnosis not present

## 2020-08-16 DIAGNOSIS — I10 Essential (primary) hypertension: Secondary | ICD-10-CM | POA: Diagnosis not present

## 2020-08-16 DIAGNOSIS — Z7984 Long term (current) use of oral hypoglycemic drugs: Secondary | ICD-10-CM | POA: Diagnosis not present

## 2020-08-16 DIAGNOSIS — G14 Postpolio syndrome: Secondary | ICD-10-CM | POA: Diagnosis not present

## 2020-08-16 DIAGNOSIS — M519 Unspecified thoracic, thoracolumbar and lumbosacral intervertebral disc disorder: Secondary | ICD-10-CM | POA: Diagnosis not present

## 2020-08-17 DIAGNOSIS — M810 Age-related osteoporosis without current pathological fracture: Secondary | ICD-10-CM | POA: Diagnosis not present

## 2020-08-17 DIAGNOSIS — G14 Postpolio syndrome: Secondary | ICD-10-CM | POA: Diagnosis not present

## 2020-08-17 DIAGNOSIS — I10 Essential (primary) hypertension: Secondary | ICD-10-CM | POA: Diagnosis not present

## 2020-08-17 DIAGNOSIS — M25511 Pain in right shoulder: Secondary | ICD-10-CM | POA: Diagnosis not present

## 2020-08-17 DIAGNOSIS — E119 Type 2 diabetes mellitus without complications: Secondary | ICD-10-CM | POA: Diagnosis not present

## 2020-08-17 DIAGNOSIS — E039 Hypothyroidism, unspecified: Secondary | ICD-10-CM | POA: Diagnosis not present

## 2020-08-17 DIAGNOSIS — Z7984 Long term (current) use of oral hypoglycemic drugs: Secondary | ICD-10-CM | POA: Diagnosis not present

## 2020-08-17 DIAGNOSIS — M519 Unspecified thoracic, thoracolumbar and lumbosacral intervertebral disc disorder: Secondary | ICD-10-CM | POA: Diagnosis not present

## 2020-08-17 DIAGNOSIS — M47816 Spondylosis without myelopathy or radiculopathy, lumbar region: Secondary | ICD-10-CM | POA: Diagnosis not present

## 2020-08-24 DIAGNOSIS — G14 Postpolio syndrome: Secondary | ICD-10-CM | POA: Diagnosis not present

## 2020-08-24 DIAGNOSIS — E039 Hypothyroidism, unspecified: Secondary | ICD-10-CM | POA: Diagnosis not present

## 2020-08-24 DIAGNOSIS — M25511 Pain in right shoulder: Secondary | ICD-10-CM | POA: Diagnosis not present

## 2020-08-24 DIAGNOSIS — M47816 Spondylosis without myelopathy or radiculopathy, lumbar region: Secondary | ICD-10-CM | POA: Diagnosis not present

## 2020-08-24 DIAGNOSIS — E119 Type 2 diabetes mellitus without complications: Secondary | ICD-10-CM | POA: Diagnosis not present

## 2020-08-24 DIAGNOSIS — Z7984 Long term (current) use of oral hypoglycemic drugs: Secondary | ICD-10-CM | POA: Diagnosis not present

## 2020-08-24 DIAGNOSIS — M810 Age-related osteoporosis without current pathological fracture: Secondary | ICD-10-CM | POA: Diagnosis not present

## 2020-08-24 DIAGNOSIS — M519 Unspecified thoracic, thoracolumbar and lumbosacral intervertebral disc disorder: Secondary | ICD-10-CM | POA: Diagnosis not present

## 2020-08-24 DIAGNOSIS — I10 Essential (primary) hypertension: Secondary | ICD-10-CM | POA: Diagnosis not present

## 2020-08-26 DIAGNOSIS — M519 Unspecified thoracic, thoracolumbar and lumbosacral intervertebral disc disorder: Secondary | ICD-10-CM | POA: Diagnosis not present

## 2020-08-26 DIAGNOSIS — I10 Essential (primary) hypertension: Secondary | ICD-10-CM | POA: Diagnosis not present

## 2020-08-26 DIAGNOSIS — M25511 Pain in right shoulder: Secondary | ICD-10-CM | POA: Diagnosis not present

## 2020-08-26 DIAGNOSIS — Z7984 Long term (current) use of oral hypoglycemic drugs: Secondary | ICD-10-CM | POA: Diagnosis not present

## 2020-08-26 DIAGNOSIS — E119 Type 2 diabetes mellitus without complications: Secondary | ICD-10-CM | POA: Diagnosis not present

## 2020-08-26 DIAGNOSIS — E039 Hypothyroidism, unspecified: Secondary | ICD-10-CM | POA: Diagnosis not present

## 2020-08-26 DIAGNOSIS — M47816 Spondylosis without myelopathy or radiculopathy, lumbar region: Secondary | ICD-10-CM | POA: Diagnosis not present

## 2020-08-26 DIAGNOSIS — G14 Postpolio syndrome: Secondary | ICD-10-CM | POA: Diagnosis not present

## 2020-08-26 DIAGNOSIS — M810 Age-related osteoporosis without current pathological fracture: Secondary | ICD-10-CM | POA: Diagnosis not present

## 2020-08-30 DIAGNOSIS — G14 Postpolio syndrome: Secondary | ICD-10-CM | POA: Diagnosis not present

## 2020-08-30 DIAGNOSIS — M519 Unspecified thoracic, thoracolumbar and lumbosacral intervertebral disc disorder: Secondary | ICD-10-CM | POA: Diagnosis not present

## 2020-08-30 DIAGNOSIS — Z7984 Long term (current) use of oral hypoglycemic drugs: Secondary | ICD-10-CM | POA: Diagnosis not present

## 2020-08-30 DIAGNOSIS — E039 Hypothyroidism, unspecified: Secondary | ICD-10-CM | POA: Diagnosis not present

## 2020-08-30 DIAGNOSIS — M25511 Pain in right shoulder: Secondary | ICD-10-CM | POA: Diagnosis not present

## 2020-08-30 DIAGNOSIS — M810 Age-related osteoporosis without current pathological fracture: Secondary | ICD-10-CM | POA: Diagnosis not present

## 2020-08-30 DIAGNOSIS — I10 Essential (primary) hypertension: Secondary | ICD-10-CM | POA: Diagnosis not present

## 2020-08-30 DIAGNOSIS — M47816 Spondylosis without myelopathy or radiculopathy, lumbar region: Secondary | ICD-10-CM | POA: Diagnosis not present

## 2020-08-30 DIAGNOSIS — E119 Type 2 diabetes mellitus without complications: Secondary | ICD-10-CM | POA: Diagnosis not present

## 2020-08-31 DIAGNOSIS — Z7984 Long term (current) use of oral hypoglycemic drugs: Secondary | ICD-10-CM | POA: Diagnosis not present

## 2020-08-31 DIAGNOSIS — M519 Unspecified thoracic, thoracolumbar and lumbosacral intervertebral disc disorder: Secondary | ICD-10-CM | POA: Diagnosis not present

## 2020-08-31 DIAGNOSIS — M810 Age-related osteoporosis without current pathological fracture: Secondary | ICD-10-CM | POA: Diagnosis not present

## 2020-08-31 DIAGNOSIS — G14 Postpolio syndrome: Secondary | ICD-10-CM | POA: Diagnosis not present

## 2020-08-31 DIAGNOSIS — E119 Type 2 diabetes mellitus without complications: Secondary | ICD-10-CM | POA: Diagnosis not present

## 2020-08-31 DIAGNOSIS — I10 Essential (primary) hypertension: Secondary | ICD-10-CM | POA: Diagnosis not present

## 2020-08-31 DIAGNOSIS — M47816 Spondylosis without myelopathy or radiculopathy, lumbar region: Secondary | ICD-10-CM | POA: Diagnosis not present

## 2020-08-31 DIAGNOSIS — M25511 Pain in right shoulder: Secondary | ICD-10-CM | POA: Diagnosis not present

## 2020-08-31 DIAGNOSIS — E039 Hypothyroidism, unspecified: Secondary | ICD-10-CM | POA: Diagnosis not present

## 2020-09-01 DIAGNOSIS — G14 Postpolio syndrome: Secondary | ICD-10-CM | POA: Diagnosis not present

## 2020-09-01 DIAGNOSIS — M47816 Spondylosis without myelopathy or radiculopathy, lumbar region: Secondary | ICD-10-CM | POA: Diagnosis not present

## 2020-09-01 DIAGNOSIS — M810 Age-related osteoporosis without current pathological fracture: Secondary | ICD-10-CM | POA: Diagnosis not present

## 2020-09-01 DIAGNOSIS — E039 Hypothyroidism, unspecified: Secondary | ICD-10-CM | POA: Diagnosis not present

## 2020-09-01 DIAGNOSIS — E119 Type 2 diabetes mellitus without complications: Secondary | ICD-10-CM | POA: Diagnosis not present

## 2020-09-01 DIAGNOSIS — M519 Unspecified thoracic, thoracolumbar and lumbosacral intervertebral disc disorder: Secondary | ICD-10-CM | POA: Diagnosis not present

## 2020-09-01 DIAGNOSIS — Z7984 Long term (current) use of oral hypoglycemic drugs: Secondary | ICD-10-CM | POA: Diagnosis not present

## 2020-09-01 DIAGNOSIS — M25511 Pain in right shoulder: Secondary | ICD-10-CM | POA: Diagnosis not present

## 2020-09-01 DIAGNOSIS — I10 Essential (primary) hypertension: Secondary | ICD-10-CM | POA: Diagnosis not present

## 2020-09-06 DIAGNOSIS — E039 Hypothyroidism, unspecified: Secondary | ICD-10-CM | POA: Diagnosis not present

## 2020-09-06 DIAGNOSIS — M47816 Spondylosis without myelopathy or radiculopathy, lumbar region: Secondary | ICD-10-CM | POA: Diagnosis not present

## 2020-09-06 DIAGNOSIS — M810 Age-related osteoporosis without current pathological fracture: Secondary | ICD-10-CM | POA: Diagnosis not present

## 2020-09-06 DIAGNOSIS — M25511 Pain in right shoulder: Secondary | ICD-10-CM | POA: Diagnosis not present

## 2020-09-06 DIAGNOSIS — E119 Type 2 diabetes mellitus without complications: Secondary | ICD-10-CM | POA: Diagnosis not present

## 2020-09-06 DIAGNOSIS — M519 Unspecified thoracic, thoracolumbar and lumbosacral intervertebral disc disorder: Secondary | ICD-10-CM | POA: Diagnosis not present

## 2020-09-06 DIAGNOSIS — Z7984 Long term (current) use of oral hypoglycemic drugs: Secondary | ICD-10-CM | POA: Diagnosis not present

## 2020-09-06 DIAGNOSIS — G14 Postpolio syndrome: Secondary | ICD-10-CM | POA: Diagnosis not present

## 2020-09-06 DIAGNOSIS — I10 Essential (primary) hypertension: Secondary | ICD-10-CM | POA: Diagnosis not present

## 2020-09-07 DIAGNOSIS — M47816 Spondylosis without myelopathy or radiculopathy, lumbar region: Secondary | ICD-10-CM | POA: Diagnosis not present

## 2020-09-07 DIAGNOSIS — Z7984 Long term (current) use of oral hypoglycemic drugs: Secondary | ICD-10-CM | POA: Diagnosis not present

## 2020-09-07 DIAGNOSIS — M810 Age-related osteoporosis without current pathological fracture: Secondary | ICD-10-CM | POA: Diagnosis not present

## 2020-09-07 DIAGNOSIS — M25511 Pain in right shoulder: Secondary | ICD-10-CM | POA: Diagnosis not present

## 2020-09-07 DIAGNOSIS — I10 Essential (primary) hypertension: Secondary | ICD-10-CM | POA: Diagnosis not present

## 2020-09-07 DIAGNOSIS — M519 Unspecified thoracic, thoracolumbar and lumbosacral intervertebral disc disorder: Secondary | ICD-10-CM | POA: Diagnosis not present

## 2020-09-07 DIAGNOSIS — E119 Type 2 diabetes mellitus without complications: Secondary | ICD-10-CM | POA: Diagnosis not present

## 2020-09-07 DIAGNOSIS — G14 Postpolio syndrome: Secondary | ICD-10-CM | POA: Diagnosis not present

## 2020-09-07 DIAGNOSIS — E039 Hypothyroidism, unspecified: Secondary | ICD-10-CM | POA: Diagnosis not present

## 2020-09-13 DIAGNOSIS — M519 Unspecified thoracic, thoracolumbar and lumbosacral intervertebral disc disorder: Secondary | ICD-10-CM | POA: Diagnosis not present

## 2020-09-13 DIAGNOSIS — Z7984 Long term (current) use of oral hypoglycemic drugs: Secondary | ICD-10-CM | POA: Diagnosis not present

## 2020-09-13 DIAGNOSIS — M47816 Spondylosis without myelopathy or radiculopathy, lumbar region: Secondary | ICD-10-CM | POA: Diagnosis not present

## 2020-09-13 DIAGNOSIS — E119 Type 2 diabetes mellitus without complications: Secondary | ICD-10-CM | POA: Diagnosis not present

## 2020-09-13 DIAGNOSIS — I10 Essential (primary) hypertension: Secondary | ICD-10-CM | POA: Diagnosis not present

## 2020-09-13 DIAGNOSIS — G14 Postpolio syndrome: Secondary | ICD-10-CM | POA: Diagnosis not present

## 2020-09-13 DIAGNOSIS — E039 Hypothyroidism, unspecified: Secondary | ICD-10-CM | POA: Diagnosis not present

## 2020-09-13 DIAGNOSIS — M25511 Pain in right shoulder: Secondary | ICD-10-CM | POA: Diagnosis not present

## 2020-09-13 DIAGNOSIS — M810 Age-related osteoporosis without current pathological fracture: Secondary | ICD-10-CM | POA: Diagnosis not present

## 2020-09-14 DIAGNOSIS — M47816 Spondylosis without myelopathy or radiculopathy, lumbar region: Secondary | ICD-10-CM | POA: Diagnosis not present

## 2020-09-14 DIAGNOSIS — Z7984 Long term (current) use of oral hypoglycemic drugs: Secondary | ICD-10-CM | POA: Diagnosis not present

## 2020-09-14 DIAGNOSIS — M25511 Pain in right shoulder: Secondary | ICD-10-CM | POA: Diagnosis not present

## 2020-09-14 DIAGNOSIS — I10 Essential (primary) hypertension: Secondary | ICD-10-CM | POA: Diagnosis not present

## 2020-09-14 DIAGNOSIS — E039 Hypothyroidism, unspecified: Secondary | ICD-10-CM | POA: Diagnosis not present

## 2020-09-14 DIAGNOSIS — M519 Unspecified thoracic, thoracolumbar and lumbosacral intervertebral disc disorder: Secondary | ICD-10-CM | POA: Diagnosis not present

## 2020-09-14 DIAGNOSIS — G14 Postpolio syndrome: Secondary | ICD-10-CM | POA: Diagnosis not present

## 2020-09-14 DIAGNOSIS — M810 Age-related osteoporosis without current pathological fracture: Secondary | ICD-10-CM | POA: Diagnosis not present

## 2020-09-14 DIAGNOSIS — E119 Type 2 diabetes mellitus without complications: Secondary | ICD-10-CM | POA: Diagnosis not present

## 2020-09-15 DIAGNOSIS — N763 Subacute and chronic vulvitis: Secondary | ICD-10-CM | POA: Diagnosis not present

## 2020-09-15 DIAGNOSIS — L89159 Pressure ulcer of sacral region, unspecified stage: Secondary | ICD-10-CM | POA: Diagnosis not present

## 2020-09-16 DIAGNOSIS — G14 Postpolio syndrome: Secondary | ICD-10-CM | POA: Diagnosis not present

## 2020-09-20 ENCOUNTER — Ambulatory Visit: Payer: Medicare HMO

## 2020-09-20 DIAGNOSIS — M810 Age-related osteoporosis without current pathological fracture: Secondary | ICD-10-CM | POA: Diagnosis not present

## 2020-09-20 DIAGNOSIS — I10 Essential (primary) hypertension: Secondary | ICD-10-CM | POA: Diagnosis not present

## 2020-09-20 DIAGNOSIS — M519 Unspecified thoracic, thoracolumbar and lumbosacral intervertebral disc disorder: Secondary | ICD-10-CM | POA: Diagnosis not present

## 2020-09-20 DIAGNOSIS — Z7984 Long term (current) use of oral hypoglycemic drugs: Secondary | ICD-10-CM | POA: Diagnosis not present

## 2020-09-20 DIAGNOSIS — E119 Type 2 diabetes mellitus without complications: Secondary | ICD-10-CM | POA: Diagnosis not present

## 2020-09-20 DIAGNOSIS — G14 Postpolio syndrome: Secondary | ICD-10-CM | POA: Diagnosis not present

## 2020-09-20 DIAGNOSIS — M25511 Pain in right shoulder: Secondary | ICD-10-CM | POA: Diagnosis not present

## 2020-09-20 DIAGNOSIS — M47816 Spondylosis without myelopathy or radiculopathy, lumbar region: Secondary | ICD-10-CM | POA: Diagnosis not present

## 2020-09-20 DIAGNOSIS — E039 Hypothyroidism, unspecified: Secondary | ICD-10-CM | POA: Diagnosis not present

## 2020-09-23 ENCOUNTER — Ambulatory Visit: Payer: Medicare Other | Admitting: Cardiovascular Disease

## 2020-09-23 DIAGNOSIS — G14 Postpolio syndrome: Secondary | ICD-10-CM | POA: Diagnosis not present

## 2020-09-23 DIAGNOSIS — M47816 Spondylosis without myelopathy or radiculopathy, lumbar region: Secondary | ICD-10-CM | POA: Diagnosis not present

## 2020-09-23 DIAGNOSIS — E119 Type 2 diabetes mellitus without complications: Secondary | ICD-10-CM | POA: Diagnosis not present

## 2020-09-23 DIAGNOSIS — M519 Unspecified thoracic, thoracolumbar and lumbosacral intervertebral disc disorder: Secondary | ICD-10-CM | POA: Diagnosis not present

## 2020-09-23 DIAGNOSIS — E039 Hypothyroidism, unspecified: Secondary | ICD-10-CM | POA: Diagnosis not present

## 2020-09-23 DIAGNOSIS — M810 Age-related osteoporosis without current pathological fracture: Secondary | ICD-10-CM | POA: Diagnosis not present

## 2020-09-23 DIAGNOSIS — I10 Essential (primary) hypertension: Secondary | ICD-10-CM | POA: Diagnosis not present

## 2020-09-23 DIAGNOSIS — M25511 Pain in right shoulder: Secondary | ICD-10-CM | POA: Diagnosis not present

## 2020-09-23 DIAGNOSIS — Z7984 Long term (current) use of oral hypoglycemic drugs: Secondary | ICD-10-CM | POA: Diagnosis not present

## 2020-09-24 ENCOUNTER — Ambulatory Visit: Payer: Medicare Other | Admitting: Cardiovascular Disease

## 2020-09-27 DIAGNOSIS — M47816 Spondylosis without myelopathy or radiculopathy, lumbar region: Secondary | ICD-10-CM | POA: Diagnosis not present

## 2020-09-27 DIAGNOSIS — E119 Type 2 diabetes mellitus without complications: Secondary | ICD-10-CM | POA: Diagnosis not present

## 2020-09-27 DIAGNOSIS — M810 Age-related osteoporosis without current pathological fracture: Secondary | ICD-10-CM | POA: Diagnosis not present

## 2020-09-27 DIAGNOSIS — I10 Essential (primary) hypertension: Secondary | ICD-10-CM | POA: Diagnosis not present

## 2020-09-27 DIAGNOSIS — M519 Unspecified thoracic, thoracolumbar and lumbosacral intervertebral disc disorder: Secondary | ICD-10-CM | POA: Diagnosis not present

## 2020-09-27 DIAGNOSIS — Z7984 Long term (current) use of oral hypoglycemic drugs: Secondary | ICD-10-CM | POA: Diagnosis not present

## 2020-09-27 DIAGNOSIS — M25511 Pain in right shoulder: Secondary | ICD-10-CM | POA: Diagnosis not present

## 2020-09-27 DIAGNOSIS — E039 Hypothyroidism, unspecified: Secondary | ICD-10-CM | POA: Diagnosis not present

## 2020-09-27 DIAGNOSIS — G14 Postpolio syndrome: Secondary | ICD-10-CM | POA: Diagnosis not present

## 2020-09-28 ENCOUNTER — Encounter: Payer: Self-pay | Admitting: Emergency Medicine

## 2020-09-28 ENCOUNTER — Ambulatory Visit
Admission: EM | Admit: 2020-09-28 | Discharge: 2020-09-28 | Disposition: A | Discharge status: Home / Self Care | Payer: Medicare HMO | Attending: Student | Admitting: Student

## 2020-09-28 ENCOUNTER — Other Ambulatory Visit: Payer: Self-pay

## 2020-09-28 DIAGNOSIS — Z87442 Personal history of urinary calculi: Secondary | ICD-10-CM | POA: Diagnosis not present

## 2020-09-28 DIAGNOSIS — N200 Calculus of kidney: Secondary | ICD-10-CM | POA: Diagnosis not present

## 2020-09-28 DIAGNOSIS — R311 Benign essential microscopic hematuria: Secondary | ICD-10-CM | POA: Diagnosis not present

## 2020-09-28 LAB — POCT URINALYSIS DIP (MANUAL ENTRY)
Bilirubin, UA: NEGATIVE
Glucose, UA: NEGATIVE mg/dL
Ketones, POC UA: NEGATIVE mg/dL
Leukocytes, UA: NEGATIVE
Nitrite, UA: NEGATIVE
Protein Ur, POC: NEGATIVE mg/dL
Spec Grav, UA: <=1.005 — AB (ref 1.010–1.025)
Urobilinogen, UA: 0.2 E.U./dL
pH, UA: 5 (ref 5.0–8.0)

## 2020-09-28 MED ORDER — TAMSULOSIN HCL 0.4 MG PO CAPS: 0.4000 mg | ORAL_CAPSULE | Freq: Every day | ORAL | 0 refills | Status: DC

## 2020-09-28 MED ORDER — TRAMADOL HCL 50 MG PO TABS: 50.0000 mg | ORAL_TABLET | Freq: Four times a day (QID) | ORAL | 0 refills | Status: DC | PRN

## 2020-09-28 NOTE — ED Triage Notes (Signed)
Patient presents to [facility] for evaluation of lower abdominal cramping and right flank pain started 2 days ago.  Patient uses bedside commode, or bed pan at home for urination

## 2020-09-28 NOTE — Discharge Instructions (Addendum)
-  Tramadol up to every 6 hours for pain. -Flomax 1 pill daily for helping the kidney stone pass. -Make sure to drink plenty of fluids to help pass the kidney stone. -Most kidney stones can pass on their own, but some are too big.  If this happens, you will develop abdominal pain, back pain that is getting worse, new symptoms like fever/chills.  Come to the ED if you develop these symptoms.

## 2020-09-28 NOTE — ED Provider Notes (Signed)
[facility]    CSN: [id number] Arrival date & time: 09/28/20  1435      History   Chief Complaint Chief Complaint  Patient presents with  . Abdominal Pain    HPI [name] is a 75 y.o. female presenting with lower R abd cramping and R flank pain x2 days radiating to right groin.  Medical history kidney stones, chronic back pain, post-polio syndrome, type 2 diabetes.   Occasional UTIs but last was 3 years ago.  This patient is wheelchair-bound given postpolio syndrome, she uses bedside commode or bed pan at home for urination.  Was at [facility] earlier today, states that she was sent here by her [facility] physician as they suspected her back pain had a urinary cause.  Endorses one episode of dysuria one hour ago but no other issues with this. Denies hematuria, frequency, urgency, n/v/d, fevers/chills, abdnormal vaginal discharge, vaginal lesions.    HPI  Past Medical History:  Diagnosis Date  . Angina pectoris ([place]) 08/02/2016  . Anxiety   . Arthritis    "hands, knees, back" (07/18/2013)  . Asthma   . CHF (congestive heart failure) ([place])   . Chronic back pain   . Chronic lower back pain   . Family history of anesthesia complication    Mother and Sister- N/V  . GERD (gastroesophageal reflux disease)   . H/O hiatal hernia   . JJKKXFGH(829.9)    "weekly" (07/18/2013)  . Hyperlipemia   . Hypertension   . Hypothyroidism   . Insomnia   . Kidney stones   . Mental disorder   . Migraine    "years ago" (07/18/2013)  . Osteopenia   . Post-polio syndrome   . Post-polio syndrome   . Sleep apnea   . Takotsubo cardiomyopathy   . Type II diabetes mellitus [facility])     Patient Active Problem List   Diagnosis Date Noted  . Angina pectoris ([place]) 08/02/2016  . Post-polio syndrome 07/20/2013  .          07/20/2013  . Left patella fracture 07/18/2013  . HYPOTHYROIDISM 03/12/2009  . DM 03/12/2009  . Essential hypertension 03/12/2009  . ATRIAL FIBRILLATION 03/12/2009   . TAKOTSUBO SYNDROME 03/12/2009  . EDEMA 03/12/2009    Past Surgical History:  Procedure Laterality Date  . ABDOMINAL HYSTERECTOMY    . APPENDECTOMY    . CARPAL TUNNEL RELEASE Bilateral   . CHOLECYSTECTOMY    . COLONOSCOPY    . CYSTOSCOPY    . KNEE ARTHROSCOPY Left   . KNEE SURGERY Left   . LEG SURGERY Left    polio  . ORIF PATELLA Left 07/18/2013   Procedure: OPEN REDUCTION INTERNAL (ORIF) LEFT FIXATION PATELLA;  Surgeon: Augustin Schooling, MD;  Location: [facility];  Service: Orthopedics;  Laterality: Left;  . ORIF PATELLA FRACTURE Left 07/18/2013  . TUBAL LIGATION    . WISDOM TOOTH EXTRACTION    . WRIST TENDON TRANSFER Right     OB History   No obstetric history on file.      Home Medications    Prior to Admission medications   Medication Sig Start Date End Date Taking? Authorizing Provider  tamsulosin (FLOMAX) 0.4 MG CAPS capsule Take 1 capsule (0.4 mg total) by mouth daily. You can stop when symptoms resolve 09/28/20  Yes Hazel Sams, PA-C  traMADol (ULTRAM) 50 MG tablet Take 1 tablet (50 mg total) by mouth every 6 (six) hours as needed. 09/28/20  Yes Hazel Sams, PA-C  ACCU-CHEK AVIVA PLUS test strip  08/06/19   [provider]  amLODipine (NORVASC) 2.5 MG tablet TAKE ONE TABLET AT BEDTIME. 08/11/20   Skeet Latch, MD  ascorbic acid (VITAMIN C) 500 MG tablet Take 500 mg by mouth daily.    [provider]  aspirin 81 MG tablet Take 81 mg by mouth at bedtime.     [provider]  atorvastatin (LIPITOR) 40 MG tablet Take 40 mg by mouth daily.    [provider]  b complex vitamins tablet Take 1 tablet by mouth daily.    [provider]  benzonatate (TESSALON) 100 MG capsule Take 100 mg by mouth 3 (three) times daily as needed for cough.     [provider]  busPIRone (BUSPAR) 7.5 MG tablet Take 7.5 mg by mouth daily.    [provider]  Calcium Carbonate-Vitamin D (CALTRATE 600+D) 600-400 MG-UNIT per chew  tablet Chew 1 tablet by mouth 2 (two) times daily.     [provider]  carvedilol (COREG) 25 MG tablet carvedilol 25 mg tablet    [provider]  Cholecalciferol (D-3-5) 125 MCG (5000 UT) capsule Take 5,000 Units by mouth daily.     [provider]  clonazePAM (KLONOPIN) 0.5 MG tablet Take 0.5 mg by mouth 2 (two) times daily.     [provider]  diclofenac Sodium (VOLTAREN) 1 % GEL Voltaren 1 % topical gel  APPLY 2 GRAM TO THE AFFECTED AREA(S) BY TOPICAL ROUTE 2-3 TIMES PER DAY    [provider]  famotidine (PEPCID) 20 MG tablet Take 20 mg by mouth daily.  07/30/19   [provider]  fish oil-omega-3 fatty acids 1000 MG capsule Take 2 g by mouth 2 (two) times daily.      [provider]  gabapentin (NEURONTIN) 300 MG capsule Take 300 mg by mouth at bedtime.    [provider]  HYDROXYZINE PAMOATE PO Take 25 mg by mouth.    [provider]  Hyoscyamine Sulfate SL 0.125 MG SUBL  11/03/19   [provider]  levothyroxine (SYNTHROID, LEVOTHROID) 100 MCG tablet Take 100 mcg by mouth daily before breakfast.    [provider]  loratadine (CLARITIN) 10 MG tablet Take 10 mg by mouth daily. 12/17/19   [provider]  Melatonin 5 MG CAPS Take 5 mg by mouth daily.    [provider]  metFORMIN (GLUCOPHAGE-XR) 500 MG 24 hr tablet Take 1 tablet by mouth in the morning and at bedtime.  12/24/18   [provider]  mirabegron ER (MYRBETRIQ) 50 MG TB24 tablet Take 50 mg by mouth every other day.     [provider]  Multiple Vitamins-Minerals (MULTIVITAMIN WITH MINERALS) tablet Take 1 tablet by mouth daily.      [provider]  nitroGLYCERIN (NITROSTAT) 0.4 MG SL tablet Place 0.4 mg under the tongue every 5 (five) minutes as needed for chest pain.    [provider]  nystatin cream (MYCOSTATIN) Apply 1 application topically daily as needed for dry skin  (irritation. boils on buttocks). Use as needed 09/29/14   [provider]  pantoprazole (PROTONIX) 40 MG tablet Take 1 tablet by mouth daily. Take 1 tab daily 12/21/14   [provider]  telmisartan (MICARDIS) 80 MG tablet Take 1 tablet by mouth daily. Take 1 tab daily 12/14/14   [provider]  tiZANidine (ZANAFLEX) 4 MG tablet Take 4 mg by mouth 5 (five) times daily  as needed for muscle spasms. PAIN    [provider]  torsemide (DEMADEX) 5 MG tablet Take 5 mg by mouth daily.    [provider]  vitamin E (VITAMIN E) 400 UNIT capsule Take 400 Units by mouth daily.    [provider]  Zinc 50 MG TABS Take 50 mg by mouth daily.    [provider]  zinc gluconate 50 MG tablet Take 50 mg by mouth daily.    [provider]  zolpidem (AMBIEN) 5 MG tablet Take 5 mg by mouth at bedtime as needed for sleep.    [provider]    Family History Family History  Problem Relation Age of Onset  . Heart failure Mother   . Parkinson's disease Mother   . Diabetes Father   . Cancer Father   . Heart failure Father   . Allergies Grandchild     Social History Social History   Tobacco Use  . Smoking status: Never Smoker  . Smokeless tobacco: Never Used  Vaping Use  . Vaping Use: Never used  Substance Use Topics  . Alcohol use: No  . Drug use: No     Allergies   Ciprofloxacin, Carisoprodol-aspirin, Penicillins, Sulfa antibiotics, Acetaminophen, Aspirin, Macrodantin [nitrofurantoin macrocrystal], Nsaids, Percocet [oxycodone-acetaminophen], Propoxyphene, Propoxyphene n-acetaminophen, Soma compound [carisoprodol-aspirin], and Tolectin [tolmetin]   Review of Systems Review of Systems  Constitutional: Negative for appetite change, chills, diaphoresis and fever.  Respiratory: Negative for shortness of breath.   Cardiovascular: Negative for chest pain.  Gastrointestinal: Positive for abdominal pain. Negative for blood in  stool, constipation, diarrhea, nausea and vomiting.  Genitourinary: Positive for flank pain. Negative for decreased urine volume, difficulty urinating, dysuria, frequency, genital sores, hematuria, urgency, vaginal bleeding, vaginal discharge and vaginal pain.  Musculoskeletal: Negative for back pain.  Neurological: Negative for dizziness, weakness and light-headedness.  All other systems reviewed and are negative.    Physical Exam Triage Vital Signs ED Triage Vitals  Enc Vitals Group     BP 09/28/20 1500 139/78     Pulse Rate 09/28/20 1500 (!) 52     Resp 09/28/20 1500 18     Temp 09/28/20 1500 98.3 F (36.8 C)     Temp Source 09/28/20 1500 Oral     SpO2 09/28/20 1500 95 %     Weight --      Height --      Head Circumference --      Peak Flow --      Pain Score 09/28/20 1501 9     Pain Loc --      Pain Edu? --      Excl. in [place]? --    No data found.  Updated Vital Signs BP 139/78 (BP Location: Left Arm)   Pulse (!) 52   Temp 98.3 F (36.8 C) (Oral)   Resp 18   SpO2 95%   Visual Acuity Right Eye Distance:   Left Eye Distance:   Bilateral Distance:    Right Eye Near:   Left Eye Near:    Bilateral Near:     Physical Exam Vitals reviewed.  Constitutional:      General: She is not in acute distress.    Appearance: Normal appearance. She is not ill-appearing.  HENT:     Head: Normocephalic and atraumatic.     Mouth/Throat:     Mouth: Mucous membranes are moist.     Comments: Moist mucous membranes Eyes:     Extraocular Movements: Extraocular  movements intact.     Pupils: Pupils are equal, round, and reactive to light.  Cardiovascular:     Rate and Rhythm: Normal rate and regular rhythm.     Heart sounds: Normal heart sounds.  Pulmonary:     Effort: Pulmonary effort is normal.     Breath sounds: Normal breath sounds. No wheezing, rhonchi or rales.  Abdominal:     General: Bowel sounds are normal. There is no distension.     Palpations: Abdomen is soft.  There is no mass.     Tenderness: There is abdominal tenderness in the right lower quadrant. There is no right CVA tenderness, left CVA tenderness, guarding or rebound. Negative signs include Murphy's sign, Rovsing's sign and McBurney's sign.     Comments: Mild RLQ tenderness to deep palpation No CVAT  Skin:    General: Skin is warm.     Capillary Refill: Capillary refill takes less than 2 seconds.     Comments: Good skin turgor  Neurological:     General: No focal deficit present.     Mental Status: She is alert and oriented to person, place, and time.  Psychiatric:        Mood and Affect: Mood normal.        Behavior: Behavior normal.      UC Treatments / Results  Labs (all labs ordered are listed, but only abnormal results are displayed) Labs Reviewed  POCT URINALYSIS DIP (MANUAL ENTRY) - Abnormal; Notable for the following components:      Result Value   Spec Grav, UA <=1.005 (*)    Blood, UA trace-intact (*)    All other components within normal limits  URINE CULTURE    EKG   Radiology No results found.  Procedures Procedures (including critical care time)  Medications Ordered in UC Medications - No data to display  Initial Impression / Assessment and Plan / UC Course  I have reviewed the triage vital signs and the nursing notes.  Pertinent labs & imaging results that were available during my care of the patient were reviewed by me and considered in my medical decision making (see chart for details).     This patient is a 75 year old female presenting with suspected nephrolithiasis.  She is afebrile, nontachycardic.  Mild RLQ abdominal pain, no CVAT.  UA with trace blood, otherwise within normal limits.  Culture sent. Last UTI 3 years ago.   Patient with history of nephrolithiasis in the past. Suspect this is causing her symptoms again today. Plan to treat with tramadol and Flomax as below, rec good hydration.  Chart review does show a sulfa allergy, but  patient does not know what this allergy is or if she is truly allergic, denies anaphylactic reaction. She is allergic to NSAIDs.  Strict ED return precautions discussed.  Final Clinical Impressions(s) / UC Diagnoses   Final diagnoses:  Nephrolithiasis  History of nephrolithiasis  Benign essential microscopic hematuria     Discharge Instructions     -Tramadol up to every 6 hours for pain. -Flomax 1 pill daily for helping the kidney stone pass. -Make sure to drink plenty of fluids to help pass the kidney stone. -Most kidney stones can pass on their own, but some are too big.  If this happens, you will develop abdominal pain, back pain that is getting worse, new symptoms like fever/chills.  Come to the ED if you develop these symptoms.    ED Prescriptions    Medication Sig Dispense Auth. Provider  traMADol (ULTRAM) 50 MG tablet Take 1 tablet (50 mg total) by mouth every 6 (six) hours as needed. 12 tablet Hazel Sams, PA-C   tamsulosin (FLOMAX) 0.4 MG CAPS capsule Take 1 capsule (0.4 mg total) by mouth daily. You can stop when symptoms resolve 7 capsule Hazel Sams, PA-C     I have reviewed the PDMP during this encounter.   Hazel Sams, PA-C 09/28/20 1626

## 2020-09-30 LAB — URINE CULTURE

## 2020-10-01 DIAGNOSIS — M519 Unspecified thoracic, thoracolumbar and lumbosacral intervertebral disc disorder: Secondary | ICD-10-CM | POA: Diagnosis not present

## 2020-10-01 DIAGNOSIS — M810 Age-related osteoporosis without current pathological fracture: Secondary | ICD-10-CM | POA: Diagnosis not present

## 2020-10-01 DIAGNOSIS — Z7984 Long term (current) use of oral hypoglycemic drugs: Secondary | ICD-10-CM | POA: Diagnosis not present

## 2020-10-01 DIAGNOSIS — E119 Type 2 diabetes mellitus without complications: Secondary | ICD-10-CM | POA: Diagnosis not present

## 2020-10-01 DIAGNOSIS — I10 Essential (primary) hypertension: Secondary | ICD-10-CM | POA: Diagnosis not present

## 2020-10-01 DIAGNOSIS — G14 Postpolio syndrome: Secondary | ICD-10-CM | POA: Diagnosis not present

## 2020-10-01 DIAGNOSIS — E039 Hypothyroidism, unspecified: Secondary | ICD-10-CM | POA: Diagnosis not present

## 2020-10-01 DIAGNOSIS — M47816 Spondylosis without myelopathy or radiculopathy, lumbar region: Secondary | ICD-10-CM | POA: Diagnosis not present

## 2020-10-01 DIAGNOSIS — M25511 Pain in right shoulder: Secondary | ICD-10-CM | POA: Diagnosis not present

## 2020-10-04 DIAGNOSIS — M519 Unspecified thoracic, thoracolumbar and lumbosacral intervertebral disc disorder: Secondary | ICD-10-CM | POA: Diagnosis not present

## 2020-10-04 DIAGNOSIS — M25511 Pain in right shoulder: Secondary | ICD-10-CM | POA: Diagnosis not present

## 2020-10-04 DIAGNOSIS — E039 Hypothyroidism, unspecified: Secondary | ICD-10-CM | POA: Diagnosis not present

## 2020-10-04 DIAGNOSIS — M810 Age-related osteoporosis without current pathological fracture: Secondary | ICD-10-CM | POA: Diagnosis not present

## 2020-10-04 DIAGNOSIS — E119 Type 2 diabetes mellitus without complications: Secondary | ICD-10-CM | POA: Diagnosis not present

## 2020-10-04 DIAGNOSIS — M47816 Spondylosis without myelopathy or radiculopathy, lumbar region: Secondary | ICD-10-CM | POA: Diagnosis not present

## 2020-10-04 DIAGNOSIS — G14 Postpolio syndrome: Secondary | ICD-10-CM | POA: Diagnosis not present

## 2020-10-04 DIAGNOSIS — I10 Essential (primary) hypertension: Secondary | ICD-10-CM | POA: Diagnosis not present

## 2020-10-04 DIAGNOSIS — Z7984 Long term (current) use of oral hypoglycemic drugs: Secondary | ICD-10-CM | POA: Diagnosis not present

## 2020-10-05 DIAGNOSIS — Z87442 Personal history of urinary calculi: Secondary | ICD-10-CM | POA: Diagnosis not present

## 2020-10-05 DIAGNOSIS — N319 Neuromuscular dysfunction of bladder, unspecified: Secondary | ICD-10-CM | POA: Diagnosis not present

## 2020-10-05 DIAGNOSIS — R1031 Right lower quadrant pain: Secondary | ICD-10-CM | POA: Diagnosis not present

## 2020-10-05 DIAGNOSIS — R109 Unspecified abdominal pain: Secondary | ICD-10-CM | POA: Diagnosis not present

## 2020-10-05 DIAGNOSIS — N2889 Other specified disorders of kidney and ureter: Secondary | ICD-10-CM | POA: Diagnosis not present

## 2020-10-05 DIAGNOSIS — G8220 Paraplegia, unspecified: Secondary | ICD-10-CM | POA: Diagnosis not present

## 2020-10-05 DIAGNOSIS — N261 Atrophy of kidney (terminal): Secondary | ICD-10-CM | POA: Diagnosis not present

## 2020-10-07 ENCOUNTER — Ambulatory Visit: Payer: Medicare HMO | Admitting: Cardiovascular Disease

## 2020-10-08 DIAGNOSIS — M47816 Spondylosis without myelopathy or radiculopathy, lumbar region: Secondary | ICD-10-CM | POA: Diagnosis not present

## 2020-10-08 DIAGNOSIS — M519 Unspecified thoracic, thoracolumbar and lumbosacral intervertebral disc disorder: Secondary | ICD-10-CM | POA: Diagnosis not present

## 2020-10-08 DIAGNOSIS — I10 Essential (primary) hypertension: Secondary | ICD-10-CM | POA: Diagnosis not present

## 2020-10-08 DIAGNOSIS — M810 Age-related osteoporosis without current pathological fracture: Secondary | ICD-10-CM | POA: Diagnosis not present

## 2020-10-08 DIAGNOSIS — M25511 Pain in right shoulder: Secondary | ICD-10-CM | POA: Diagnosis not present

## 2020-10-08 DIAGNOSIS — E039 Hypothyroidism, unspecified: Secondary | ICD-10-CM | POA: Diagnosis not present

## 2020-10-08 DIAGNOSIS — G14 Postpolio syndrome: Secondary | ICD-10-CM | POA: Diagnosis not present

## 2020-10-08 DIAGNOSIS — Z7984 Long term (current) use of oral hypoglycemic drugs: Secondary | ICD-10-CM | POA: Diagnosis not present

## 2020-10-08 DIAGNOSIS — E119 Type 2 diabetes mellitus without complications: Secondary | ICD-10-CM | POA: Diagnosis not present

## 2020-10-12 DIAGNOSIS — Z90710 Acquired absence of both cervix and uterus: Secondary | ICD-10-CM | POA: Diagnosis not present

## 2020-10-12 DIAGNOSIS — E785 Hyperlipidemia, unspecified: Secondary | ICD-10-CM | POA: Diagnosis not present

## 2020-10-12 DIAGNOSIS — G8929 Other chronic pain: Secondary | ICD-10-CM | POA: Diagnosis not present

## 2020-10-12 DIAGNOSIS — K5730 Diverticulosis of large intestine without perforation or abscess without bleeding: Secondary | ICD-10-CM | POA: Diagnosis not present

## 2020-10-12 DIAGNOSIS — M545 Low back pain, unspecified: Secondary | ICD-10-CM | POA: Diagnosis not present

## 2020-10-12 DIAGNOSIS — R2243 Localized swelling, mass and lump, lower limb, bilateral: Secondary | ICD-10-CM | POA: Diagnosis not present

## 2020-10-12 DIAGNOSIS — R109 Unspecified abdominal pain: Secondary | ICD-10-CM | POA: Diagnosis not present

## 2020-10-12 DIAGNOSIS — R1031 Right lower quadrant pain: Secondary | ICD-10-CM | POA: Diagnosis not present

## 2020-10-12 DIAGNOSIS — E119 Type 2 diabetes mellitus without complications: Secondary | ICD-10-CM | POA: Diagnosis not present

## 2020-10-12 DIAGNOSIS — I10 Essential (primary) hypertension: Secondary | ICD-10-CM | POA: Diagnosis not present

## 2020-10-12 DIAGNOSIS — M549 Dorsalgia, unspecified: Secondary | ICD-10-CM | POA: Diagnosis not present

## 2020-10-12 DIAGNOSIS — G8220 Paraplegia, unspecified: Secondary | ICD-10-CM | POA: Diagnosis not present

## 2020-10-12 DIAGNOSIS — Z9049 Acquired absence of other specified parts of digestive tract: Secondary | ICD-10-CM | POA: Diagnosis not present

## 2020-10-14 ENCOUNTER — Ambulatory Visit: Payer: Medicare HMO | Admitting: Physician Assistant

## 2020-10-17 DIAGNOSIS — G14 Postpolio syndrome: Secondary | ICD-10-CM | POA: Diagnosis not present

## 2020-10-18 ENCOUNTER — Ambulatory Visit: Payer: Medicare HMO | Admitting: Nurse Practitioner

## 2020-10-18 DIAGNOSIS — E119 Type 2 diabetes mellitus without complications: Secondary | ICD-10-CM | POA: Diagnosis not present

## 2020-10-18 DIAGNOSIS — Z7984 Long term (current) use of oral hypoglycemic drugs: Secondary | ICD-10-CM | POA: Diagnosis not present

## 2020-10-18 DIAGNOSIS — M810 Age-related osteoporosis without current pathological fracture: Secondary | ICD-10-CM | POA: Diagnosis not present

## 2020-10-18 DIAGNOSIS — E039 Hypothyroidism, unspecified: Secondary | ICD-10-CM | POA: Diagnosis not present

## 2020-10-18 DIAGNOSIS — M519 Unspecified thoracic, thoracolumbar and lumbosacral intervertebral disc disorder: Secondary | ICD-10-CM | POA: Diagnosis not present

## 2020-10-18 DIAGNOSIS — G14 Postpolio syndrome: Secondary | ICD-10-CM | POA: Diagnosis not present

## 2020-10-18 DIAGNOSIS — M47816 Spondylosis without myelopathy or radiculopathy, lumbar region: Secondary | ICD-10-CM | POA: Diagnosis not present

## 2020-10-18 DIAGNOSIS — I10 Essential (primary) hypertension: Secondary | ICD-10-CM | POA: Diagnosis not present

## 2020-10-18 DIAGNOSIS — M25511 Pain in right shoulder: Secondary | ICD-10-CM | POA: Diagnosis not present

## 2020-10-20 DIAGNOSIS — E039 Hypothyroidism, unspecified: Secondary | ICD-10-CM | POA: Diagnosis not present

## 2020-10-20 DIAGNOSIS — M47816 Spondylosis without myelopathy or radiculopathy, lumbar region: Secondary | ICD-10-CM | POA: Diagnosis not present

## 2020-10-20 DIAGNOSIS — G14 Postpolio syndrome: Secondary | ICD-10-CM | POA: Diagnosis not present

## 2020-10-20 DIAGNOSIS — Z7984 Long term (current) use of oral hypoglycemic drugs: Secondary | ICD-10-CM | POA: Diagnosis not present

## 2020-10-20 DIAGNOSIS — M25511 Pain in right shoulder: Secondary | ICD-10-CM | POA: Diagnosis not present

## 2020-10-20 DIAGNOSIS — M519 Unspecified thoracic, thoracolumbar and lumbosacral intervertebral disc disorder: Secondary | ICD-10-CM | POA: Diagnosis not present

## 2020-10-20 DIAGNOSIS — M810 Age-related osteoporosis without current pathological fracture: Secondary | ICD-10-CM | POA: Diagnosis not present

## 2020-10-20 DIAGNOSIS — E119 Type 2 diabetes mellitus without complications: Secondary | ICD-10-CM | POA: Diagnosis not present

## 2020-10-20 DIAGNOSIS — I10 Essential (primary) hypertension: Secondary | ICD-10-CM | POA: Diagnosis not present

## 2020-10-25 ENCOUNTER — Ambulatory Visit: Payer: Medicare HMO | Admitting: Nurse Practitioner

## 2020-10-25 DIAGNOSIS — M25511 Pain in right shoulder: Secondary | ICD-10-CM | POA: Diagnosis not present

## 2020-10-25 DIAGNOSIS — I10 Essential (primary) hypertension: Secondary | ICD-10-CM | POA: Diagnosis not present

## 2020-10-25 DIAGNOSIS — M47816 Spondylosis without myelopathy or radiculopathy, lumbar region: Secondary | ICD-10-CM | POA: Diagnosis not present

## 2020-10-25 DIAGNOSIS — Z7984 Long term (current) use of oral hypoglycemic drugs: Secondary | ICD-10-CM | POA: Diagnosis not present

## 2020-10-25 DIAGNOSIS — E039 Hypothyroidism, unspecified: Secondary | ICD-10-CM | POA: Diagnosis not present

## 2020-10-25 DIAGNOSIS — M519 Unspecified thoracic, thoracolumbar and lumbosacral intervertebral disc disorder: Secondary | ICD-10-CM | POA: Diagnosis not present

## 2020-10-25 DIAGNOSIS — G14 Postpolio syndrome: Secondary | ICD-10-CM | POA: Diagnosis not present

## 2020-10-25 DIAGNOSIS — M810 Age-related osteoporosis without current pathological fracture: Secondary | ICD-10-CM | POA: Diagnosis not present

## 2020-10-25 DIAGNOSIS — E119 Type 2 diabetes mellitus without complications: Secondary | ICD-10-CM | POA: Diagnosis not present

## 2020-10-27 DIAGNOSIS — M810 Age-related osteoporosis without current pathological fracture: Secondary | ICD-10-CM | POA: Diagnosis not present

## 2020-10-27 DIAGNOSIS — M25511 Pain in right shoulder: Secondary | ICD-10-CM | POA: Diagnosis not present

## 2020-10-27 DIAGNOSIS — I10 Essential (primary) hypertension: Secondary | ICD-10-CM | POA: Diagnosis not present

## 2020-10-27 DIAGNOSIS — E119 Type 2 diabetes mellitus without complications: Secondary | ICD-10-CM | POA: Diagnosis not present

## 2020-10-27 DIAGNOSIS — G14 Postpolio syndrome: Secondary | ICD-10-CM | POA: Diagnosis not present

## 2020-10-27 DIAGNOSIS — M47816 Spondylosis without myelopathy or radiculopathy, lumbar region: Secondary | ICD-10-CM | POA: Diagnosis not present

## 2020-10-27 DIAGNOSIS — E039 Hypothyroidism, unspecified: Secondary | ICD-10-CM | POA: Diagnosis not present

## 2020-10-27 DIAGNOSIS — Z7984 Long term (current) use of oral hypoglycemic drugs: Secondary | ICD-10-CM | POA: Diagnosis not present

## 2020-10-27 DIAGNOSIS — M519 Unspecified thoracic, thoracolumbar and lumbosacral intervertebral disc disorder: Secondary | ICD-10-CM | POA: Diagnosis not present

## 2020-10-30 ENCOUNTER — Other Ambulatory Visit: Payer: Self-pay | Admitting: Cardiovascular Disease

## 2020-10-31 DIAGNOSIS — M519 Unspecified thoracic, thoracolumbar and lumbosacral intervertebral disc disorder: Secondary | ICD-10-CM | POA: Diagnosis not present

## 2020-10-31 DIAGNOSIS — E039 Hypothyroidism, unspecified: Secondary | ICD-10-CM | POA: Diagnosis not present

## 2020-10-31 DIAGNOSIS — Z7984 Long term (current) use of oral hypoglycemic drugs: Secondary | ICD-10-CM | POA: Diagnosis not present

## 2020-10-31 DIAGNOSIS — E119 Type 2 diabetes mellitus without complications: Secondary | ICD-10-CM | POA: Diagnosis not present

## 2020-10-31 DIAGNOSIS — I10 Essential (primary) hypertension: Secondary | ICD-10-CM | POA: Diagnosis not present

## 2020-10-31 DIAGNOSIS — G14 Postpolio syndrome: Secondary | ICD-10-CM | POA: Diagnosis not present

## 2020-10-31 DIAGNOSIS — M47816 Spondylosis without myelopathy or radiculopathy, lumbar region: Secondary | ICD-10-CM | POA: Diagnosis not present

## 2020-10-31 DIAGNOSIS — M25511 Pain in right shoulder: Secondary | ICD-10-CM | POA: Diagnosis not present

## 2020-10-31 DIAGNOSIS — M810 Age-related osteoporosis without current pathological fracture: Secondary | ICD-10-CM | POA: Diagnosis not present

## 2020-11-01 DIAGNOSIS — I2510 Atherosclerotic heart disease of native coronary artery without angina pectoris: Secondary | ICD-10-CM | POA: Diagnosis not present

## 2020-11-01 DIAGNOSIS — E611 Iron deficiency: Secondary | ICD-10-CM | POA: Diagnosis not present

## 2020-11-01 DIAGNOSIS — E039 Hypothyroidism, unspecified: Secondary | ICD-10-CM | POA: Diagnosis not present

## 2020-11-01 DIAGNOSIS — E1169 Type 2 diabetes mellitus with other specified complication: Secondary | ICD-10-CM | POA: Diagnosis not present

## 2020-11-01 DIAGNOSIS — I10 Essential (primary) hypertension: Secondary | ICD-10-CM | POA: Diagnosis not present

## 2020-11-01 DIAGNOSIS — I509 Heart failure, unspecified: Secondary | ICD-10-CM | POA: Diagnosis not present

## 2020-11-01 DIAGNOSIS — G4700 Insomnia, unspecified: Secondary | ICD-10-CM | POA: Diagnosis not present

## 2020-11-01 DIAGNOSIS — Z8612 Personal history of poliomyelitis: Secondary | ICD-10-CM | POA: Diagnosis not present

## 2020-11-01 DIAGNOSIS — E785 Hyperlipidemia, unspecified: Secondary | ICD-10-CM | POA: Diagnosis not present

## 2020-11-05 ENCOUNTER — Ambulatory Visit: Payer: Medicare HMO | Admitting: Adult Health

## 2020-11-08 DIAGNOSIS — M810 Age-related osteoporosis without current pathological fracture: Secondary | ICD-10-CM | POA: Diagnosis not present

## 2020-11-08 DIAGNOSIS — M25511 Pain in right shoulder: Secondary | ICD-10-CM | POA: Diagnosis not present

## 2020-11-08 DIAGNOSIS — G14 Postpolio syndrome: Secondary | ICD-10-CM | POA: Diagnosis not present

## 2020-11-08 DIAGNOSIS — E119 Type 2 diabetes mellitus without complications: Secondary | ICD-10-CM | POA: Diagnosis not present

## 2020-11-08 DIAGNOSIS — I10 Essential (primary) hypertension: Secondary | ICD-10-CM | POA: Diagnosis not present

## 2020-11-08 DIAGNOSIS — M47816 Spondylosis without myelopathy or radiculopathy, lumbar region: Secondary | ICD-10-CM | POA: Diagnosis not present

## 2020-11-08 DIAGNOSIS — Z7984 Long term (current) use of oral hypoglycemic drugs: Secondary | ICD-10-CM | POA: Diagnosis not present

## 2020-11-08 DIAGNOSIS — E039 Hypothyroidism, unspecified: Secondary | ICD-10-CM | POA: Diagnosis not present

## 2020-11-08 DIAGNOSIS — M519 Unspecified thoracic, thoracolumbar and lumbosacral intervertebral disc disorder: Secondary | ICD-10-CM | POA: Diagnosis not present

## 2020-11-10 DIAGNOSIS — M47816 Spondylosis without myelopathy or radiculopathy, lumbar region: Secondary | ICD-10-CM | POA: Diagnosis not present

## 2020-11-10 DIAGNOSIS — M810 Age-related osteoporosis without current pathological fracture: Secondary | ICD-10-CM | POA: Diagnosis not present

## 2020-11-10 DIAGNOSIS — N390 Urinary tract infection, site not specified: Secondary | ICD-10-CM | POA: Diagnosis not present

## 2020-11-10 DIAGNOSIS — Z7984 Long term (current) use of oral hypoglycemic drugs: Secondary | ICD-10-CM | POA: Diagnosis not present

## 2020-11-10 DIAGNOSIS — E871 Hypo-osmolality and hyponatremia: Secondary | ICD-10-CM | POA: Diagnosis not present

## 2020-11-10 DIAGNOSIS — E119 Type 2 diabetes mellitus without complications: Secondary | ICD-10-CM | POA: Diagnosis not present

## 2020-11-10 DIAGNOSIS — G14 Postpolio syndrome: Secondary | ICD-10-CM | POA: Diagnosis not present

## 2020-11-10 DIAGNOSIS — M519 Unspecified thoracic, thoracolumbar and lumbosacral intervertebral disc disorder: Secondary | ICD-10-CM | POA: Diagnosis not present

## 2020-11-10 DIAGNOSIS — E039 Hypothyroidism, unspecified: Secondary | ICD-10-CM | POA: Diagnosis not present

## 2020-11-10 DIAGNOSIS — M25511 Pain in right shoulder: Secondary | ICD-10-CM | POA: Diagnosis not present

## 2020-11-10 DIAGNOSIS — I10 Essential (primary) hypertension: Secondary | ICD-10-CM | POA: Diagnosis not present

## 2020-11-16 DIAGNOSIS — G14 Postpolio syndrome: Secondary | ICD-10-CM | POA: Diagnosis not present

## 2020-11-17 DIAGNOSIS — M47816 Spondylosis without myelopathy or radiculopathy, lumbar region: Secondary | ICD-10-CM | POA: Diagnosis not present

## 2020-11-17 DIAGNOSIS — Z7984 Long term (current) use of oral hypoglycemic drugs: Secondary | ICD-10-CM | POA: Diagnosis not present

## 2020-11-17 DIAGNOSIS — E039 Hypothyroidism, unspecified: Secondary | ICD-10-CM | POA: Diagnosis not present

## 2020-11-17 DIAGNOSIS — M810 Age-related osteoporosis without current pathological fracture: Secondary | ICD-10-CM | POA: Diagnosis not present

## 2020-11-17 DIAGNOSIS — M25511 Pain in right shoulder: Secondary | ICD-10-CM | POA: Diagnosis not present

## 2020-11-17 DIAGNOSIS — E119 Type 2 diabetes mellitus without complications: Secondary | ICD-10-CM | POA: Diagnosis not present

## 2020-11-17 DIAGNOSIS — M519 Unspecified thoracic, thoracolumbar and lumbosacral intervertebral disc disorder: Secondary | ICD-10-CM | POA: Diagnosis not present

## 2020-11-17 DIAGNOSIS — G14 Postpolio syndrome: Secondary | ICD-10-CM | POA: Diagnosis not present

## 2020-11-17 DIAGNOSIS — I10 Essential (primary) hypertension: Secondary | ICD-10-CM | POA: Diagnosis not present

## 2020-11-18 DIAGNOSIS — M545 Low back pain, unspecified: Secondary | ICD-10-CM | POA: Diagnosis not present

## 2020-11-23 DIAGNOSIS — I2510 Atherosclerotic heart disease of native coronary artery without angina pectoris: Secondary | ICD-10-CM | POA: Diagnosis not present

## 2020-11-23 DIAGNOSIS — R9341 Abnormal radiologic findings on diagnostic imaging of renal pelvis, ureter, or bladder: Secondary | ICD-10-CM | POA: Diagnosis not present

## 2020-11-23 DIAGNOSIS — I509 Heart failure, unspecified: Secondary | ICD-10-CM | POA: Diagnosis not present

## 2020-11-23 DIAGNOSIS — E039 Hypothyroidism, unspecified: Secondary | ICD-10-CM | POA: Diagnosis not present

## 2020-11-23 DIAGNOSIS — I10 Essential (primary) hypertension: Secondary | ICD-10-CM | POA: Diagnosis not present

## 2020-11-23 DIAGNOSIS — E785 Hyperlipidemia, unspecified: Secondary | ICD-10-CM | POA: Diagnosis not present

## 2020-11-23 DIAGNOSIS — E1169 Type 2 diabetes mellitus with other specified complication: Secondary | ICD-10-CM | POA: Diagnosis not present

## 2020-11-23 DIAGNOSIS — Z8612 Personal history of poliomyelitis: Secondary | ICD-10-CM | POA: Diagnosis not present

## 2020-11-23 DIAGNOSIS — I700 Atherosclerosis of aorta: Secondary | ICD-10-CM | POA: Diagnosis not present

## 2020-11-25 DIAGNOSIS — I10 Essential (primary) hypertension: Secondary | ICD-10-CM | POA: Diagnosis not present

## 2020-11-25 DIAGNOSIS — M810 Age-related osteoporosis without current pathological fracture: Secondary | ICD-10-CM | POA: Diagnosis not present

## 2020-11-25 DIAGNOSIS — E039 Hypothyroidism, unspecified: Secondary | ICD-10-CM | POA: Diagnosis not present

## 2020-11-25 DIAGNOSIS — Z7984 Long term (current) use of oral hypoglycemic drugs: Secondary | ICD-10-CM | POA: Diagnosis not present

## 2020-11-25 DIAGNOSIS — M519 Unspecified thoracic, thoracolumbar and lumbosacral intervertebral disc disorder: Secondary | ICD-10-CM | POA: Diagnosis not present

## 2020-11-25 DIAGNOSIS — G14 Postpolio syndrome: Secondary | ICD-10-CM | POA: Diagnosis not present

## 2020-11-25 DIAGNOSIS — E119 Type 2 diabetes mellitus without complications: Secondary | ICD-10-CM | POA: Diagnosis not present

## 2020-11-25 DIAGNOSIS — M47816 Spondylosis without myelopathy or radiculopathy, lumbar region: Secondary | ICD-10-CM | POA: Diagnosis not present

## 2020-11-25 DIAGNOSIS — M25511 Pain in right shoulder: Secondary | ICD-10-CM | POA: Diagnosis not present

## 2020-12-15 ENCOUNTER — Encounter: Payer: Self-pay | Admitting: Physical Medicine and Rehabilitation

## 2020-12-17 DIAGNOSIS — G14 Postpolio syndrome: Secondary | ICD-10-CM | POA: Diagnosis not present

## 2020-12-20 DIAGNOSIS — H04123 Dry eye syndrome of bilateral lacrimal glands: Secondary | ICD-10-CM | POA: Diagnosis not present

## 2020-12-20 DIAGNOSIS — E119 Type 2 diabetes mellitus without complications: Secondary | ICD-10-CM | POA: Diagnosis not present

## 2020-12-20 DIAGNOSIS — Z961 Presence of intraocular lens: Secondary | ICD-10-CM | POA: Diagnosis not present

## 2020-12-20 DIAGNOSIS — H26493 Other secondary cataract, bilateral: Secondary | ICD-10-CM | POA: Diagnosis not present

## 2021-01-17 DIAGNOSIS — G14 Postpolio syndrome: Secondary | ICD-10-CM | POA: Diagnosis not present

## 2021-02-07 ENCOUNTER — Encounter (HOSPITAL_BASED_OUTPATIENT_CLINIC_OR_DEPARTMENT_OTHER): Payer: Self-pay | Admitting: Cardiovascular Disease

## 2021-02-07 ENCOUNTER — Ambulatory Visit (HOSPITAL_BASED_OUTPATIENT_CLINIC_OR_DEPARTMENT_OTHER): Payer: Medicare HMO | Admitting: Cardiovascular Disease

## 2021-02-07 ENCOUNTER — Other Ambulatory Visit: Payer: Self-pay

## 2021-02-07 VITALS — BP 117/84 | HR 63

## 2021-02-07 DIAGNOSIS — I10 Essential (primary) hypertension: Secondary | ICD-10-CM

## 2021-02-07 DIAGNOSIS — L97829 Non-pressure chronic ulcer of other part of left lower leg with unspecified severity: Secondary | ICD-10-CM | POA: Diagnosis not present

## 2021-02-07 DIAGNOSIS — I480 Paroxysmal atrial fibrillation: Secondary | ICD-10-CM

## 2021-02-07 DIAGNOSIS — L97921 Non-pressure chronic ulcer of unspecified part of left lower leg limited to breakdown of skin: Secondary | ICD-10-CM

## 2021-02-07 DIAGNOSIS — E7800 Pure hypercholesterolemia, unspecified: Secondary | ICD-10-CM

## 2021-02-07 DIAGNOSIS — L97909 Non-pressure chronic ulcer of unspecified part of unspecified lower leg with unspecified severity: Secondary | ICD-10-CM

## 2021-02-07 HISTORY — DX: Non-pressure chronic ulcer of unspecified part of unspecified lower leg with unspecified severity: L97.909

## 2021-02-07 HISTORY — DX: Pure hypercholesterolemia, unspecified: E78.00
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
## 2021-02-07 NOTE — Assessment & Plan Note (Signed)
Blood pressure well have been controlled.  She has not been taking amlodipine and her blood pressure is still at goal.  We will take it off her list.  Continue carvedilol, telmisartan, and torsemide.

## 2021-02-07 NOTE — Assessment & Plan Note (Signed)
Continue atorvastatin

## 2021-02-07 NOTE — Assessment & Plan Note (Signed)
LVEF normalized.  She is euvolemic.  Continue torsemide.

## 2021-02-07 NOTE — Assessment & Plan Note (Signed)
[name] has a nonhealing leg wound.  She injured her left anterior tibia nearly a month ago and the wound is still present.  She has poor peripheral pulses bilaterally.  We will get arterial Dopplers.  I instructed her to stop using peroxide.  We will send in neomycin ointment.  If this does not improve she will need to see the wound clinic.  It is between the size of a nickel and a quarter.

## 2021-02-07 NOTE — Patient Instructions (Signed)
Medication Instructions:  APPLY NEOMYCIN OINTMENT TO YOUR LEGS SEVERAL TIMES A DAY   *If you need a refill on your cardiac medications before your next appointment, please call your pharmacy*  Lab Work: NONE   Testing/Procedures: Your physician has requested that you have an ankle brachial index (ABI). During this test an ultrasound and blood pressure cuff are used to evaluate the arteries that supply the arms and legs with blood. Allow thirty minutes for this exam. There are no restrictions or special instructions.  Your physician has requested that you have a lower or upper extremity arterial duplex. This test is an ultrasound of the arteries in the legs or arms. It looks at arterial blood flow in the legs and arms. Allow one hour for Lower and Upper Arterial scans. There are no restrictions or special instructions LOWER EXTREMITY   Follow-Up: At [facility], you and your health needs are our priority.  As part of our continuing mission to provide you with exceptional heart care, we have created designated Provider Care Teams.  These Care Teams include your primary Cardiologist (physician) and Advanced Practice Providers (APPs -  Physician Assistants and Nurse Practitioners) who all work together to provide you with the care you need, when you need it.  We recommend signing up for the patient portal called "MyChart".  Sign up information is provided on this After Visit Summary.  MyChart is used to connect with patients for Virtual Visits (Telemedicine).  Patients are able to view lab/test results, encounter notes, upcoming appointments, etc.  Non-urgent messages can be sent to your provider as well.   To learn more about what you can do with MyChart, go to [email or web address].    Your next appointment:   6 month(s)  The format for your next appointment:   In Person  Provider:   Skeet Latch, MD or [name], NP

## 2021-02-08 NOTE — Addendum Note (Signed)
Addended by: Vennie Homans on: 02/08/2021 07:58 AM   Modules accepted: Orders

## 2021-02-16 DIAGNOSIS — G14 Postpolio syndrome: Secondary | ICD-10-CM | POA: Diagnosis not present

## 2021-02-24 ENCOUNTER — Encounter (HOSPITAL_BASED_OUTPATIENT_CLINIC_OR_DEPARTMENT_OTHER): Payer: Medicare HMO

## 2021-02-25 ENCOUNTER — Ambulatory Visit (INDEPENDENT_AMBULATORY_CARE_PROVIDER_SITE_OTHER): Payer: Medicare HMO

## 2021-02-25 ENCOUNTER — Other Ambulatory Visit: Payer: Self-pay

## 2021-02-25 DIAGNOSIS — L97829 Non-pressure chronic ulcer of other part of left lower leg with unspecified severity: Secondary | ICD-10-CM

## 2021-02-25 DIAGNOSIS — L97828 Non-pressure chronic ulcer of other part of left lower leg with other specified severity: Secondary | ICD-10-CM

## 2021-03-04 ENCOUNTER — Encounter: Payer: Medicare HMO | Admitting: Physical Medicine and Rehabilitation

## 2021-03-15 DIAGNOSIS — Z23 Encounter for immunization: Secondary | ICD-10-CM | POA: Diagnosis not present

## 2021-03-15 DIAGNOSIS — I10 Essential (primary) hypertension: Secondary | ICD-10-CM | POA: Diagnosis not present

## 2021-03-15 DIAGNOSIS — Z7409 Other reduced mobility: Secondary | ICD-10-CM | POA: Diagnosis not present

## 2021-03-15 DIAGNOSIS — S80812A Abrasion, left lower leg, initial encounter: Secondary | ICD-10-CM | POA: Diagnosis not present

## 2021-03-15 DIAGNOSIS — Z8612 Personal history of poliomyelitis: Secondary | ICD-10-CM | POA: Diagnosis not present

## 2021-03-15 DIAGNOSIS — G4486 Cervicogenic headache: Secondary | ICD-10-CM | POA: Diagnosis not present

## 2021-03-19 DIAGNOSIS — G14 Postpolio syndrome: Secondary | ICD-10-CM | POA: Diagnosis not present

## 2021-03-22 DIAGNOSIS — R7989 Other specified abnormal findings of blood chemistry: Secondary | ICD-10-CM | POA: Diagnosis not present

## 2021-03-22 DIAGNOSIS — Z8612 Personal history of poliomyelitis: Secondary | ICD-10-CM | POA: Diagnosis not present

## 2021-03-22 DIAGNOSIS — G4700 Insomnia, unspecified: Secondary | ICD-10-CM | POA: Diagnosis not present

## 2021-03-22 DIAGNOSIS — E611 Iron deficiency: Secondary | ICD-10-CM | POA: Diagnosis not present

## 2021-03-22 DIAGNOSIS — I700 Atherosclerosis of aorta: Secondary | ICD-10-CM | POA: Diagnosis not present

## 2021-03-22 DIAGNOSIS — I2510 Atherosclerotic heart disease of native coronary artery without angina pectoris: Secondary | ICD-10-CM | POA: Diagnosis not present

## 2021-03-22 DIAGNOSIS — I509 Heart failure, unspecified: Secondary | ICD-10-CM | POA: Diagnosis not present

## 2021-03-22 DIAGNOSIS — I10 Essential (primary) hypertension: Secondary | ICD-10-CM | POA: Diagnosis not present

## 2021-03-22 DIAGNOSIS — M810 Age-related osteoporosis without current pathological fracture: Secondary | ICD-10-CM | POA: Diagnosis not present

## 2021-03-22 DIAGNOSIS — E039 Hypothyroidism, unspecified: Secondary | ICD-10-CM | POA: Diagnosis not present

## 2021-03-22 DIAGNOSIS — E1169 Type 2 diabetes mellitus with other specified complication: Secondary | ICD-10-CM | POA: Diagnosis not present

## 2021-03-22 DIAGNOSIS — E785 Hyperlipidemia, unspecified: Secondary | ICD-10-CM | POA: Diagnosis not present

## 2021-03-22 DIAGNOSIS — R202 Paresthesia of skin: Secondary | ICD-10-CM | POA: Diagnosis not present

## 2021-03-22 DIAGNOSIS — Z7409 Other reduced mobility: Secondary | ICD-10-CM | POA: Diagnosis not present

## 2021-04-26 DIAGNOSIS — L739 Follicular disorder, unspecified: Secondary | ICD-10-CM | POA: Diagnosis not present

## 2021-04-26 DIAGNOSIS — B372 Candidiasis of skin and nail: Secondary | ICD-10-CM | POA: Diagnosis not present

## 2021-05-05 DIAGNOSIS — E1169 Type 2 diabetes mellitus with other specified complication: Secondary | ICD-10-CM | POA: Diagnosis not present

## 2021-05-05 DIAGNOSIS — I509 Heart failure, unspecified: Secondary | ICD-10-CM | POA: Diagnosis not present

## 2021-05-05 DIAGNOSIS — I10 Essential (primary) hypertension: Secondary | ICD-10-CM | POA: Diagnosis not present

## 2021-05-05 DIAGNOSIS — I2510 Atherosclerotic heart disease of native coronary artery without angina pectoris: Secondary | ICD-10-CM | POA: Diagnosis not present

## 2021-05-05 DIAGNOSIS — G4700 Insomnia, unspecified: Secondary | ICD-10-CM | POA: Diagnosis not present

## 2021-05-05 DIAGNOSIS — E039 Hypothyroidism, unspecified: Secondary | ICD-10-CM | POA: Diagnosis not present

## 2021-05-05 DIAGNOSIS — E785 Hyperlipidemia, unspecified: Secondary | ICD-10-CM | POA: Diagnosis not present

## 2021-05-05 DIAGNOSIS — I480 Paroxysmal atrial fibrillation: Secondary | ICD-10-CM | POA: Diagnosis not present

## 2021-05-05 DIAGNOSIS — E611 Iron deficiency: Secondary | ICD-10-CM | POA: Diagnosis not present

## 2021-05-23 ENCOUNTER — Ambulatory Visit: Payer: Medicare HMO | Admitting: Physical Medicine and Rehabilitation

## 2021-05-30 ENCOUNTER — Other Ambulatory Visit: Payer: Self-pay

## 2021-05-30 ENCOUNTER — Encounter: Payer: Self-pay | Admitting: Physical Medicine and Rehabilitation

## 2021-05-30 ENCOUNTER — Encounter
Payer: Medicare Other | Attending: Physical Medicine and Rehabilitation | Admitting: Physical Medicine and Rehabilitation

## 2021-05-30 VITALS — BP 129/61 | HR 68

## 2021-05-30 DIAGNOSIS — G14 Postpolio syndrome: Secondary | ICD-10-CM

## 2021-05-30 DIAGNOSIS — Z993 Dependence on wheelchair: Secondary | ICD-10-CM
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
## 2021-05-30 NOTE — Patient Instructions (Signed)
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 I think she's developed post polio syndrome as well as L ankle contracture.     Husband wants to try H/H for therapy again. Placed referral- for H/H PT- my concern is that she won't be able to regain strength like we discussed, but will definitely try!  2. DO exercises- home exercise 5+ days/week. Do daily- with goal of seeing if can improve strength.   3.  Pain per Dr Veverly Fells.    4. W/C is from [employer]- both wheelchairs from them.     5.  I think the goal would be to try and maintain weight- but it's very hard to lose weight in power w/c.   6. Pedal bike for arms- can work arms more- which might help upper body strength, which will be helpful for mobilizing.   7.  Goal to get in/out of bed per husband.    8. F/U in 77months

## 2021-05-31 ENCOUNTER — Telehealth: Payer: Self-pay | Admitting: Physical Medicine and Rehabilitation

## 2021-05-31 NOTE — Telephone Encounter (Signed)
I have faxed a referral to [facility] for patient, fax # [phone].  Waiting for a response if they will accept the referral.

## 2021-06-17 ENCOUNTER — Telehealth: Payer: Self-pay

## 2021-06-17 NOTE — Telephone Encounter (Signed)
Verbal okay given to [place] the in-home RN with [facility] given.  Patient to receive in-home physical therapy care twice a week for 3 weeks. Then reassessment.   Patient stated the [facility] home nurse came by her home today of a visit.

## 2021-06-20 ENCOUNTER — Other Ambulatory Visit: Payer: Self-pay | Admitting: Obstetrics and Gynecology

## 2021-06-20 DIAGNOSIS — Z1231 Encounter for screening mammogram for malignant neoplasm of breast: Secondary | ICD-10-CM

## 2021-06-28 ENCOUNTER — Ambulatory Visit: Payer: Medicare Other

## 2021-06-29 ENCOUNTER — Encounter (HOSPITAL_COMMUNITY): Payer: Medicare Other

## 2021-07-01 ENCOUNTER — Ambulatory Visit: Payer: Medicare Other

## 2021-07-11 ENCOUNTER — Ambulatory Visit (HOSPITAL_BASED_OUTPATIENT_CLINIC_OR_DEPARTMENT_OTHER): Payer: Medicare HMO | Admitting: Cardiovascular Disease

## 2021-07-14 ENCOUNTER — Telehealth: Payer: Self-pay | Admitting: *Deleted

## 2021-07-14 NOTE — Telephone Encounter (Signed)
[name] from [facility] requesting a visit from MSW to assist with community services.  Approval given. ?

## 2021-08-01 ENCOUNTER — Other Ambulatory Visit: Payer: Self-pay

## 2021-08-01 ENCOUNTER — Ambulatory Visit
Admission: RE | Admit: 2021-08-01 | Discharge: 2021-08-01 | Disposition: A | Discharge status: Home / Self Care | Payer: Medicare Other | Source: Ambulatory Visit | Attending: Obstetrics and Gynecology | Admitting: Obstetrics and Gynecology

## 2021-08-01 DIAGNOSIS — Z1231 Encounter for screening mammogram for malignant neoplasm of breast: Secondary | ICD-10-CM

## 2021-08-02 ENCOUNTER — Ambulatory Visit (INDEPENDENT_AMBULATORY_CARE_PROVIDER_SITE_OTHER): Payer: Medicare Other | Admitting: Family

## 2021-08-02 ENCOUNTER — Encounter (HOSPITAL_BASED_OUTPATIENT_CLINIC_OR_DEPARTMENT_OTHER): Payer: Self-pay | Admitting: Family

## 2021-08-02 VITALS — BP 138/80 | HR 67 | Ht 59.0 in

## 2021-08-02 DIAGNOSIS — I5181 Takotsubo syndrome: Secondary | ICD-10-CM | POA: Diagnosis not present

## 2021-08-02 DIAGNOSIS — I10 Essential (primary) hypertension: Secondary | ICD-10-CM

## 2021-08-02 DIAGNOSIS — E785 Hyperlipidemia, unspecified: Secondary | ICD-10-CM | POA: Diagnosis not present

## 2021-08-02 DIAGNOSIS — I25118 Atherosclerotic heart disease of native coronary artery with other forms of angina pectoris: Secondary | ICD-10-CM

## 2021-08-02 MED ORDER — NITROGLYCERIN 0.4 MG SL SUBL: 0.4000 mg | SUBLINGUAL_TABLET | SUBLINGUAL | 3 refills | Status: DC | PRN

## 2021-08-02 NOTE — Progress Notes (Signed)
? ?Office Visit  ?  ?Patient Name: [name] ?Date of Encounter: 08/02/2021 ? ?PCP:  Sueanne Margarita, DO ?  ?[place]  ?Cardiologist:  Skeet Latch, MD  ?Advanced Practice Provider:  No care team member to display ?Electrophysiologist:  None  ?   ? ?Chief Complaint  ?  ?[name] is a 76 y.o. female with a hx of hypertension, mild nonobstructive coronary artery disease, Takotsubo syndrome, DM 2, postpolio syndrome, hypothyroidism presents today for 6 month follow up of hypertension.  ? ?Past Medical History  ?  ?Past Medical History:  ?Diagnosis Date  ? Angina pectoris ([place]) 08/02/2016  ? Anxiety   ? Arthritis   ? "hands, knees, back" (07/18/2013)  ? Asthma   ? CHF (congestive heart failure) ([place])   ? Chronic back pain   ? Chronic lower back pain   ? Family history of anesthesia complication   ? Mother and Sister- N/V  ? GERD (gastroesophageal reflux disease)   ? H/O hiatal hernia   ? Headache(784.0)   ? "weekly" (07/18/2013)  ? Hyperlipemia   ? Hypertension   ? Hypothyroidism   ? Insomnia   ? Kidney stones   ? Mental disorder   ? Migraine   ? "years ago" (07/18/2013)  ? Osteopenia   ? Post-polio syndrome   ? Post-polio syndrome   ? Pure hypercholesterolemia 02/07/2021  ? Sleep apnea   ? Takotsubo cardiomyopathy   ? Type II diabetes mellitus ([place])   ? Ulcer of leg, chronic ([place]) 02/07/2021  ? ?Past Surgical History:  ?Procedure Laterality Date  ? ABDOMINAL HYSTERECTOMY    ? APPENDECTOMY    ? CARPAL TUNNEL RELEASE Bilateral   ? CHOLECYSTECTOMY    ? COLONOSCOPY    ? CYSTOSCOPY    ? KNEE ARTHROSCOPY Left   ? KNEE SURGERY Left   ? LEG SURGERY Left   ? polio  ? ORIF PATELLA Left 07/18/2013  ? Procedure: OPEN REDUCTION INTERNAL (ORIF) LEFT FIXATION PATELLA;  Surgeon: Augustin Schooling, MD;  Location: [facility];  Service: Orthopedics;  Laterality: Left;  ? ORIF PATELLA FRACTURE Left 07/18/2013  ? TUBAL LIGATION    ? WISDOM TOOTH EXTRACTION    ? WRIST TENDON TRANSFER Right   ? ? ?Allergies ? ?Allergies   ?Allergen Reactions  ? Tamsulosin   ?  Other reaction(s): Other (See Comments) ?Caused uncontrollable movements  ? Ciprofloxacin Rash  ? Carisoprodol-Aspirin   ? Penicillins Itching and Other (See Comments)  ?  Unknown ?Has patient had a PCN reaction causing immediate rash, facial/tongue/throat swelling, SOB or lightheadedness with hypotension: no ?Has patient had a PCN reaction causing severe rash involving mucus membranes or skin necrosis: unknown ?Has patient had a PCN reaction that required hospitalization : dr's office ?Has patient had a PCN reaction occurring within the last 10 years: no ?If all of the above answers are "NO", then may proceed with Cephalosporin use. ?  ? Sulfa Antibiotics Other (See Comments)  ?  unknown ?Unknown.  States as always told had an allergy  ? Acetaminophen Itching  ? Aspirin Other (See Comments)  ?  Chest pain, itching ?Chest pain, itching  ? Macrodantin [Nitrofurantoin Macrocrystal] Rash  ? Nsaids Other (See Comments)  ?  Chest pain and stomach pain  ? Percocet [Oxycodone-Acetaminophen] Itching  ? Propoxyphene   ?  Chest/stomach pain  ? Propoxyphene N-Acetaminophen Other (See Comments)  ?  Chest/stomach pain  ? Soma Compound [Carisoprodol-Aspirin] Other (See Comments)  ?  "  cant judge distance"  ? Tolectin [Tolmetin] Itching  ? ? ?History of Present Illness  ?  ?[name] is a 76 y.o. female with a hx of hypertension, mild nonobstructive coronary artery disease, Takotsubo syndrome, DM 2, postpolio syndrome, hypothyroidism last seen 02/07/2021 by Dr. Oval Linsey. ? ?Initially seen in 2016 for hypertension and atypical chest pain.  She had lower extremity edema and torsemide 3 times weekly was added.  Myoview 12/2014 with LVEF 71%, no evidence of ischemia.  She had Lexiscan Myoview 08/2016 for repeat chest pain with LVEF 67% with concern for basal to mid inferior ischemia.  Subsequent cardiac CTA revealing 30% LAD stenosis and no obstructive disease. ? ?Metoprolol previously  transition to carvedilol due to elevated blood pressure and low heart rate.  Amlodipine previously discontinued due to lower extremity edema but did tolerate lower dose.Marland Kitchen  Her blood pressure has been labile and she has had difficulties with hyponatremia.  She additionally follows with nephrology.  She had renal artery Dopplers revealing normal renal artery flow but 70 to 99% SMA stenosis.  She was referred to Dr. Sophronia Simas and not felt that SMA stenosis was contributing to her symptoms of elevated blood pressure or upset stomach with certain foods.  She went to [facility] for second opinion and they agreed she did not need intervention. ? ?She was last seen 01/2021 by Dr. Oval Linsey.  She had a recent ED visit for kidney stones.  Her amlodipine had been stopped due to relative hypotension.  She had nonhealing wound and was recommended for lower extremity ABI. ? ?She presents today for follow-up. Since last seen she tells me has had decrease in mobility and is predominantly wheelchair bound though is working with [facility] PT on transfers. Her blood sugar at home has been running in the low 100s. Her blod pressure at home is usually in the 120s. Reports no shortness of breath nor dyspnea on exertion. Reports no chest pain, pressure, or tightness. No edema, orthopnea, PND. Reports no palpitations.  Requests refill of her nitroglycerin as it is expired. ? ?EKGs/Labs/Other Studies Reviewed:  ? ?The following studies were reviewed today: ? ?LE duplex 02/25/21 ?ABI Findings:  ?+---------+------------------+-----+---------+--------+  ?Right   Rt Pressure (mmHg)IndexWaveform Comment   ?+---------+------------------+-----+---------+--------+  ?Brachial 133                                       ?+---------+------------------+-----+---------+--------+  ?PTA     254               1.91 triphasic          ?+---------+------------------+-----+---------+--------+  ?DP      254               1.91 triphasic           ?+---------+------------------+-----+---------+--------+  ?Great [id number]               0.86 Normal             ?+---------+------------------+-----+---------+--------+  ? ?+---------+------------------+-----+---------+-------+  ?Left    Lt Pressure (mmHg)IndexWaveform Comment  ?+---------+------------------+-----+---------+-------+  ?Brachial 132                                      ?+---------+------------------+-----+---------+-------+  ?PTA     162  1.22 triphasic         ?+---------+------------------+-----+---------+-------+  ?DP      254               1.91 triphasic         ?+---------+------------------+-----+---------+-------+  ?Great Toe162               1.22 Normal            ?+---------+------------------+-----+---------+-------+  ? ?+-------+-----------+-----------+------------+------------+  ?ABI/TBIToday's ABIToday's TBIPrevious ABIPrevious TBI  ?+-------+-----------+-----------+------------+------------+  ?Right N/C        .86                                  ?+-------+-----------+-----------+------------+------------+  ?Left  N/C        1.22                                 ?+-------+-----------+-----------+------------+------------+  ? ?  ?Unable to Doppler peroneal arteries due to patients inability to move  ?legs.  ?  ?Summary:  ?Right: Resting right ankle-brachial index indicates noncompressible right  ?lower extremity arteries. The right toe-brachial index is normal.  ? ?Left: Resting left ankle-brachial index indicates noncompressible left  ?lower extremity arteries. The left toe-brachial index is normal.  ?  ?Renal Artery Duplex 02/17/2020: ?Summary:  ?Renal:  ?   ?Right: No evidence of right renal artery stenosis. RRV flow present.  ?       Normal size right kidney. Normal right Resisitive Index.  ?       Normal cortical thickness of right kidney.  ?Left:  No evidence of left renal artery stenosis. LRV flow present.  ?        Normal size of left kidney. Normal left Resistive Index.  ?       Normal cortical thickness of the left kidney.  ?Mesenteric:  ?Normal Celiac artery findings. 70 to 99% stenosis in the superior  ?mesenteric  ?artery. Areas of limited visceral study include right kidney size. ?  ?Cardiac CT-A 4/17/1

## 2021-08-02 NOTE — Patient Instructions (Signed)
Medication Instructions:  ?Continue your current medications.  ? ?Refill of nitroglycerin sent to your pharmacy to be used as needed for chest pain.  ? ?*If you need a refill on your cardiac medications before your next appointment, please call your pharmacy* ? ? ?Lab Work: ?None ordered today.  ? ?If you have labs (blood work) drawn today and your tests are completely normal, you will receive your results only by: ?MyChart Message (if you have MyChart) OR ?A paper copy in the mail ?If you have any lab test that is abnormal or we need to change your treatment, we will call you to review the results. ? ? ?Testing/Procedures: ?Your EKG today showed normal sinus rhythm which is a good result.  ? ? ?Follow-Up: ?At [facility], you and your health needs are our priority.  As part of our continuing mission to provide you with exceptional heart care, we have created designated Provider Care Teams.  These Care Teams include your primary Cardiologist (physician) and Advanced Practice Providers (APPs -  Physician Assistants and Nurse Practitioners) who all work together to provide you with the care you need, when you need it. ? ?We recommend signing up for the patient portal called "MyChart".  Sign up information is provided on this After Visit Summary.  MyChart is used to connect with patients for Virtual Visits (Telemedicine).  Patients are able to view lab/test results, encounter notes, upcoming appointments, etc.  Non-urgent messages can be sent to your provider as well.   ?To learn more about what you can do with MyChart, go to [email or web address].   ? ?Your next appointment:   ?September 26th at 11:20 AM with Dr. Oval Linsey ? ? ?Other Instructions ? ?Heart Healthy Diet Recommendations: ?A low-salt diet is recommended. Meats should be grilled, baked, or boiled. Avoid fried foods. Focus on lean protein sources like fish or chicken with vegetables and fruits. The American Heart Association is a Microbiologist!   American Heart Association Diet and Lifeystyle Recommendations   ? ?Exercise recommendations: ?Keep up with your physical therapy exercises! ? ?  ?

## 2021-09-02 ENCOUNTER — Encounter: Payer: Medicare Other | Admitting: Physical Medicine and Rehabilitation

## 2021-11-08 ENCOUNTER — Encounter: Payer: Self-pay | Admitting: *Deleted

## 2021-11-08 NOTE — Progress Notes (Deleted)
[facility]  PATIENT: SHAKIARA [name] DOB: March 08, 1946  REFERRING CLINICIAN: Sueanne Margarita, DO HISTORY FROM: *** REASON FOR VISIT: headaches, memory loss   HISTORICAL  CHIEF COMPLAINT:  No chief complaint on file.   HISTORY OF PRESENT ILLNESS:  ***     [facility]  PATIENT: [name] DOB: May 02, 1946  REFERRING CLINICIAN: Sueanne Margarita, DO HISTORY FROM: *** REASON FOR VISIT: memory loss   HISTORICAL  CHIEF COMPLAINT:  No chief complaint on file.   TBI: *** No past history of TBI Stroke: *** no past history of stroke Seizures: *** no past history of seizures Sleep: *** no history of sleep apnea.  Has *** never had sleep study.  STOP BANG score *** Mood: *** patient denies anxiety and depression  Functional status: independent in all ** ADLs and IADLs Patient lives with *** in a *** with *** stairs. Cooking: *** Cleaning: *** Shopping: *** Bathing: *** Toileting: *** Driving: *** Bills: *** Medications: *** Ever left the stove on by accident?: *** Forget how to use items around the house?: *** Getting lost going to familiar places?: *** Forgetting loved ones names?: *** Word finding difficulty? *** Sleep: ***   OTHER MEDICAL CONDITIONS: HTN, afib, hypothyroidism, DM, nephrolithiasis, lumbar stenosis, hx Takotsubo syndrome, CAD, hx poliomyelitis   REVIEW OF SYSTEMS: Full 14 system review of systems performed and negative with exception of: ***  ALLERGIES: Allergies  Allergen Reactions   Tamsulosin     Other reaction(s): Other (See Comments) Caused uncontrollable movements   Ciprofloxacin Rash   Carisoprodol-Aspirin    Penicillins Itching and Other (See Comments)    Unknown Has patient had a PCN reaction causing immediate rash, facial/tongue/throat swelling, SOB or lightheadedness with hypotension: no Has patient had a PCN reaction causing severe rash involving mucus membranes or skin necrosis:  unknown Has patient had a PCN reaction that required hospitalization : dr's office Has patient had a PCN reaction occurring within the last 10 years: no If all of the above answers are "NO", then may proceed with Cephalosporin use.    Sulfa Antibiotics Other (See Comments)    unknown Unknown.  States as always told had an allergy   Acetaminophen Itching   Aspirin Other (See Comments)    Chest pain, itching Chest pain, itching   Macrodantin [Nitrofurantoin Macrocrystal] Rash   Nsaids Other (See Comments)    Chest pain and stomach pain   Percocet [Oxycodone-Acetaminophen] Itching   Propoxyphene     Chest/stomach pain   Propoxyphene N-Acetaminophen Other (See Comments)    Chest/stomach pain   Soma Compound [Carisoprodol-Aspirin] Other (See Comments)    "cant judge distance"   Tolectin [Tolmetin] Itching    HOME MEDICATIONS: Outpatient Medications Prior to Visit  Medication Sig Dispense Refill   ACCU-CHEK AVIVA PLUS test strip      amitriptyline (ELAVIL) 25 MG tablet amitriptyline 25 mg tablet  TAKE ONE tab BY MOUTH AT BEDTIME     amLODipine (NORVASC) 2.5 MG tablet TAKE ONE TABLET AT BEDTIME. 90 tablet 1   ascorbic acid (VITAMIN C) 500 MG tablet Take 500 mg by mouth daily.     aspirin 81 MG tablet Take 81 mg by mouth at bedtime.      atorvastatin (LIPITOR) 40 MG tablet Take 40 mg by mouth daily.     b complex vitamins tablet Take 1 tablet by mouth daily.     benzonatate (TESSALON) 100 MG capsule Take 100 mg by mouth 3 (three) times daily  as needed for cough.      busPIRone (BUSPAR) 7.5 MG tablet Take 7.5 mg by mouth daily.     Calcium Carbonate-Vitamin D 600-400 MG-UNIT chew tablet Chew 1 tablet by mouth 2 (two) times daily.      carvedilol (COREG) 25 MG tablet carvedilol 25 mg tablet     Cholecalciferol (D-3-5) 125 MCG (5000 UT) capsule Take 5,000 Units by mouth daily.      Cholecalciferol 10 MCG (400 UNIT) CAPS Take by mouth.     Ciclopirox 0.77 % gel ciclopirox 0.77 % topical  gel  APPLY TO THE AFFECTED AREA(S) EXTERNALLY ONCE DAILY     clonazePAM (KLONOPIN) 0.5 MG tablet Take 0.5 mg by mouth 2 (two) times daily.      cycloSPORINE (RESTASIS) 0.05 % ophthalmic emulsion Restasis 0.05 % eye drops in a dropperette     diclofenac Sodium (VOLTAREN) 1 % GEL Voltaren 1 % topical gel  APPLY 2 GRAM TO THE AFFECTED AREA(S) BY TOPICAL ROUTE 2-3 TIMES PER DAY     doxycycline (VIBRA-TABS) 100 MG tablet doxycycline hyclate 100 mg tablet  TAKE ONE TABLET BY MOUTH TWICE DAILY FOR 10 DAYS     famotidine (PEPCID) 20 MG tablet Take 20 mg by mouth daily.      ferrous sulfate 325 (65 FE) MG tablet FeroSul 325 mg (65 mg iron) tablet  take 1 tablet, take apart from thyroid medicine, once daily     fish oil-omega-3 fatty acids 1000 MG capsule Take 2 g by mouth 2 (two) times daily.       gabapentin (NEURONTIN) 300 MG capsule Take 300 mg by mouth at bedtime.     hydrALAZINE (APRESOLINE) 50 MG tablet hydralazine 50 mg tablet     hydrochlorothiazide (HYDRODIURIL) 25 MG tablet hydrochlorothiazide 25 mg tablet     HYDROXYZINE PAMOATE PO Take 25 mg by mouth.     Hyoscyamine Sulfate SL 0.125 MG SUBL      levothyroxine (SYNTHROID, LEVOTHROID) 100 MCG tablet Take 100 mcg by mouth daily before breakfast.     loratadine (CLARITIN) 10 MG tablet Take 10 mg by mouth daily.     Melatonin 5 MG CAPS Take 5 mg by mouth daily.     metFORMIN (GLUCOPHAGE-XR) 500 MG 24 hr tablet Take 1 tablet by mouth in the morning and at bedtime.      mirabegron ER (MYRBETRIQ) 50 MG TB24 tablet Take 50 mg by mouth every other day.      Multiple Vitamins-Minerals (MULTIVITAMIN WITH MINERALS) tablet Take 1 tablet by mouth daily.       nitroGLYCERIN (NITROSTAT) 0.4 MG SL tablet Place 1 tablet (0.4 mg total) under the tongue every 5 (five) minutes as needed for chest pain. 25 tablet 3   nystatin cream (MYCOSTATIN) Apply 1 application topically daily as needed for dry skin (irritation. boils on buttocks). Use as needed  0    pantoprazole (PROTONIX) 40 MG tablet Take 1 tablet by mouth daily. Take 1 tab daily  98   sertraline (ZOLOFT) 50 MG tablet sertraline 50 mg tablet  TAKE ONE TABLET BY MOUTH DAILY.     telmisartan (MICARDIS) 80 MG tablet Take 1 tablet by mouth daily. Take 1 tab daily  0   tiZANidine (ZANAFLEX) 4 MG tablet Take 4 mg by mouth 5 (five) times daily as needed for muscle spasms. PAIN     torsemide (DEMADEX) 10 MG tablet Take by mouth.     torsemide (DEMADEX) 5 MG tablet Take 5 mg  by mouth daily.     traMADol (ULTRAM) 50 MG tablet Take 1 tablet (50 mg total) by mouth every 6 (six) hours as needed. 12 tablet 0   venlafaxine XR (EFFEXOR-XR) 37.5 MG 24 hr capsule venlafaxine ER 37.5 mg capsule,extended release 24 hr  TAKE ONE CAPSULE BY MOUTH ONCE DAILY WITH FOOD     vitamin E 180 MG (400 UNITS) capsule Take 400 Units by mouth daily.     Zinc 50 MG TABS Take 50 mg by mouth daily.     zinc gluconate 50 MG tablet Take 50 mg by mouth daily.     zolpidem (AMBIEN) 5 MG tablet Take 5 mg by mouth at bedtime as needed for sleep.     No facility-administered medications prior to visit.    PAST MEDICAL HISTORY: Past Medical History:  Diagnosis Date   Angina pectoris ([place]) 08/02/2016   Anxiety    Arthritis    "hands, knees, back" (07/18/2013)   Asthma    CHF (congestive heart failure) (HCC)    Chronic back pain    Chronic lower back pain    Family history of anesthesia complication    Mother and Sister- N/V   GERD (gastroesophageal reflux disease)    H/O hiatal hernia    Headache(784.0)    "weekly" (07/18/2013)   Hyperlipemia    Hypertension    Hypothyroidism    Insomnia    Kidney stones    Memory changes    Mental disorder    Migraine    "years ago" (07/18/2013)   Osteopenia    Post-polio syndrome    Pure hypercholesterolemia 02/07/2021   Sleep apnea    Takotsubo cardiomyopathy    Type II diabetes mellitus ([place])    Ulcer of leg, chronic ([place]) 02/07/2021    PAST SURGICAL HISTORY: Past  Surgical History:  Procedure Laterality Date   ABDOMINAL HYSTERECTOMY     APPENDECTOMY     CARPAL TUNNEL RELEASE Bilateral    CHOLECYSTECTOMY     COLONOSCOPY     CYSTOSCOPY     KNEE ARTHROSCOPY Left    KNEE SURGERY Left    LEG SURGERY Left    polio   ORIF PATELLA Left 07/18/2013   Procedure: OPEN REDUCTION INTERNAL (ORIF) LEFT FIXATION PATELLA;  Surgeon: Augustin Schooling, MD;  Location: [facility];  Service: Orthopedics;  Laterality: Left;   ORIF PATELLA FRACTURE Left 07/18/2013   TUBAL LIGATION     WISDOM TOOTH EXTRACTION     WRIST TENDON TRANSFER Right     FAMILY HISTORY: Family History  Problem Relation Age of Onset   Heart failure Mother    Parkinson's disease Mother    Diabetes Father    Cancer Father    Heart failure Father    Breast cancer Maternal Grandmother    Allergies Grandchild     SOCIAL HISTORY: Social History   Socioeconomic History   Marital status: Married    Spouse name: Not on file   Number of children: Not on file   Years of education: Not on file   Highest education level: Not on file  Occupational History   Occupation: Retired   Tobacco Use   Smoking status: Never   Smokeless tobacco: Never  Vaping Use   Vaping Use: Never used  Substance and Sexual Activity   Alcohol use: No   Drug use: No   Sexual activity: Never  Other Topics Concern   Not on file  Social History Narrative   Not on  file   Social Determinants of Health   Financial Resource Strain: Not on file  Food Insecurity: Not on file  Transportation Needs: Not on file  Physical Activity: Not on file  Stress: Not on file  Social Connections: Not on file  Intimate Partner Violence: Not on file     PHYSICAL EXAM ***  GENERAL EXAM/CONSTITUTIONAL: Vitals: There were no vitals filed for this visit. There is no height or weight on file to calculate BMI. Wt Readings from Last 3 Encounters:  01/01/19 185 lb (83.9 kg)  11/04/18 185 lb (83.9 kg)  10/01/18 185 lb (83.9 kg)    Patient is in no distress; well developed, nourished and groomed; neck is supple  CARDIOVASCULAR: Examination of carotid arteries is normal; no carotid bruits Regular rate and rhythm, no murmurs Examination of peripheral vascular system by observation and palpation is normal  EYES: Pupils round and reactive to light, Visual fields full to confrontation, Extraocular movements intacts,   MUSCULOSKELETAL: Gait, strength, tone, movements noted in Neurologic exam below  NEUROLOGIC: MENTAL STATUS:      No data to display         awake, alert, oriented to person, place and time recent and remote memory intact normal attention and concentration language fluent, comprehension intact, naming intact fund of knowledge appropriate  CRANIAL NERVE:  2nd - no papilledema or hemorrhages on fundoscopic exam 2nd, 3rd, 4th, 6th - pupils equal and reactive to light, visual fields full to confrontation, extraocular muscles intact, no nystagmus 5th - facial sensation symmetric 7th - facial strength symmetric 8th - hearing intact 9th - palate elevates symmetrically, uvula midline 11th - shoulder shrug symmetric 12th - tongue protrusion midline  MOTOR:  normal bulk and tone, full strength in the BUE, BLE  SENSORY:  normal and symmetric to light touch, pinprick, temperature, vibration  COORDINATION:  finger-nose-finger, fine finger movements normal  REFLEXES:  deep tendon reflexes present and symmetric  GAIT/STATION:  normal     DIAGNOSTIC DATA (LABS, IMAGING, TESTING) - I reviewed patient records, labs, notes, testing and imaging myself where available.  Lab Results  Component Value Date   WBC 12.5 (H) 09/24/2017   HGB 13.3 09/24/2017   HCT 39.0 09/24/2017   MCV 88.5 09/24/2017   PLT 467 (H) 09/24/2017      Component Value Date/Time   NA 130 (L) 09/05/2019 1142   K 4.4 09/05/2019 1142   CL 89 (L) 09/05/2019 1142   CO2 26 09/05/2019 1142   GLUCOSE 134 (H) 09/05/2019  1142   GLUCOSE 103 (H) 09/24/2017 2128   BUN 16 09/05/2019 1142   CREATININE 0.66 09/05/2019 1142   CALCIUM 9.9 09/05/2019 1142   PROT 7.3 09/24/2017 2112   ALBUMIN 3.8 09/24/2017 2112   AST 19 09/24/2017 2112   ALT 19 09/24/2017 2112   ALKPHOS 63 09/24/2017 2112   BILITOT 0.9 09/24/2017 2112   GFRNONAA 87 09/05/2019 1142   GFRAA 100 09/05/2019 1142   No results found for: "CHOL", "HDL", "LDLCALC", "LDLDIRECT", "TRIG", "CHOLHDL" Lab Results  Component Value Date   HGBA1C  06/05/2010    5.2 (NOTE)  According to the ADA Clinical Practice Recommendations for 2011, when HbA1c is used as a screening test:   >=6.5%   Diagnostic of Diabetes Mellitus           (if abnormal result  is confirmed)  5.7-6.4%   Increased risk of developing Diabetes Mellitus  References:Diagnosis and Classification of Diabetes Mellitus,Diabetes EHUD,1497,02(OVZCH 1):S62-S69 and Standards of Medical Care in         Diabetes - 2011,Diabetes YIFO,2774,12  (Suppl 1):S11-S61.   No results found for: "VITAMINB12" Lab Results  Component Value Date   TSH 3.874 06/07/2010    ***    ASSESSMENT AND PLAN  76 y.o. year old female with ***   No diagnosis found.    PLAN:   No orders of the defined types were placed in this encounter.   No orders of the defined types were placed in this encounter.   No follow-ups on file.    Genia Harold, MD  I spent an average of *** chart reviewing and counseling the patient, with at least 50% of the time face to face with the patient.   [facility] [address], [place], [place] [address] [phone]

## 2021-11-09 ENCOUNTER — Ambulatory Visit: Payer: Medicare Other | Admitting: Psychiatry

## 2021-11-10 ENCOUNTER — Ambulatory Visit: Payer: Medicare Other | Admitting: Psychiatry

## 2021-12-07 ENCOUNTER — Encounter: Payer: Self-pay | Admitting: Psychiatry

## 2021-12-07 ENCOUNTER — Ambulatory Visit: Payer: Medicare Other | Admitting: Psychiatry

## 2021-12-07 VITALS — BP 143/68 | HR 69 | Ht 60.0 in

## 2021-12-07 DIAGNOSIS — R413 Other amnesia: Secondary | ICD-10-CM

## 2021-12-07 NOTE — Patient Instructions (Signed)
Plan: -Blood work: vitamin B12, TSH -Brain MRI  Natural supplements that can reduce headaches: Magnesium Oxide or Magnesium Glycinate 500 mg at bed (up to 800 mg daily) Coenzyme Q10 300 mg in AM Vitamin B2- 200 mg twice a day  Add 1 supplement at a time since even natural supplements can have undesirable side effects. You can sometimes buy supplements cheaper (especially Coenzyme Q10) at [email or web address]-perez.com/ or at [employer].  Magnesium: Magnesium (250 mg twice a day or 500 mg at bed) has a relaxant effect on smooth muscles such as blood vessels. Individuals suffering from frequent or daily headache usually have low magnesium levels which can be increase with daily supplementation of 400-800 mg. Three trials found 40-90% average headache reduction  when used as a preventative. Magnesium also demonstrated the benefit in menstrually related migraine.  Magnesium is part of the messenger system in the serotonin cascade and it is a good muscle relaxant.  It is also useful for constipation. Good sources include nuts, whole grains, and tomatoes. Side Effects: loose stool/diarrhea  Riboflavin (vitamin B 2) 200 mg twice a day. This vitamin assists nerve cells in the production of ATP a principal energy storing molecule.  It is necessary for many chemical reactions in the body.  There have been at least 3 clinical trials of riboflavin using 400 mg per day all of which suggested that migraine frequency can be decreased.  All 3 trials showed significant improvement in over half of migraine sufferers.  The supplement is found in bread, cereal, milk, meat, and poultry.  Most Americans get more riboflavin than the recommended daily allowance, however riboflavin deficiency is not necessary for the supplements to help prevent headache. Side effects: energizing, green urine  Coenzyme Q10: This is present in almost all cells in the body and is critical component for the conversion of energy.  Recent studies have shown that a  nutritional supplement of CoQ10 can reduce the frequency of migraine attacks by improving the energy production of cells as with riboflavin.  Doses of 150 mg twice a day have been shown to be effective.
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
## 2021-12-08 ENCOUNTER — Telehealth: Payer: Self-pay

## 2021-12-08 ENCOUNTER — Telehealth: Payer: Self-pay | Admitting: Psychiatry

## 2021-12-08 LAB — TSH: TSH: 2.18 u[IU]/mL (ref 0.450–4.500)

## 2021-12-08 LAB — VITAMIN B12: Vitamin B-12: 1146 pg/mL (ref 232–1245)

## 2021-12-08 NOTE — Telephone Encounter (Signed)
-----   Message from Genia Harold, MD sent at 12/08/2021  8:17 AM EDT ----- Thyroid and B12 levels are normal

## 2021-12-08 NOTE — Telephone Encounter (Signed)
[employer] medicare NPR sent to [facility] 

## 2021-12-08 NOTE — Telephone Encounter (Signed)
Called pt to notify her about her labwork. She appreciated the call and thanked.

## 2021-12-09 ENCOUNTER — Encounter: Payer: Medicare Other | Admitting: Physical Medicine and Rehabilitation

## 2021-12-09 ENCOUNTER — Telehealth: Payer: Self-pay | Admitting: Psychiatry

## 2021-12-09 NOTE — Telephone Encounter (Signed)
Pt said, [facility] informed pt that because can not walk or stand would need to be schedulesd at a different place for MRI of the brain. Would like a call back.

## 2021-12-12 ENCOUNTER — Other Ambulatory Visit (HOSPITAL_COMMUNITY): Payer: Self-pay | Admitting: *Deleted

## 2021-12-12 NOTE — Telephone Encounter (Signed)
I [facility] the [facility] to [facility] to schedule

## 2021-12-14 ENCOUNTER — Inpatient Hospital Stay (HOSPITAL_COMMUNITY): Admission: RE | Admit: 2021-12-14 | Payer: Medicare Other | Source: Ambulatory Visit

## 2021-12-20 ENCOUNTER — Other Ambulatory Visit: Payer: Self-pay | Admitting: Cardiovascular Disease

## 2021-12-20 NOTE — Telephone Encounter (Signed)
Rx request sent to pharmacy.  

## 2021-12-22 ENCOUNTER — Ambulatory Visit (HOSPITAL_COMMUNITY)
Admission: RE | Admit: 2021-12-22 | Discharge: 2021-12-22 | Disposition: A | Discharge status: Home / Self Care | Payer: Medicare Other | Source: Ambulatory Visit | Attending: Psychiatry | Admitting: Psychiatry

## 2021-12-22 DIAGNOSIS — R413 Other amnesia: Secondary | ICD-10-CM | POA: Diagnosis present

## 2021-12-22 MED ORDER — GADOBUTROL 1 MMOL/ML IV SOLN
8.0000 mL | Freq: Once | INTRAVENOUS | Status: AC | PRN
  Administered 2021-12-22: 8 mL via INTRAVENOUS

## 2021-12-27 ENCOUNTER — Telehealth: Payer: Self-pay | Admitting: *Deleted

## 2021-12-27 ENCOUNTER — Other Ambulatory Visit: Payer: Self-pay | Admitting: Psychiatry

## 2021-12-27 DIAGNOSIS — R413 Other amnesia: Secondary | ICD-10-CM

## 2021-12-27 NOTE — Telephone Encounter (Signed)
LVM requesting call back for results. 

## 2021-12-28 ENCOUNTER — Telehealth: Payer: Self-pay | Admitting: Psychiatry

## 2021-12-28 NOTE — Telephone Encounter (Signed)
Attempted to reach patient twice,  got message: your call cannot be completed as dialed.

## 2021-12-28 NOTE — Telephone Encounter (Signed)
Referral sent to [facility] [phone]

## 2022-01-02 ENCOUNTER — Encounter: Payer: Self-pay | Admitting: *Deleted

## 2022-01-11 ENCOUNTER — Telehealth: Payer: Self-pay

## 2022-01-11 NOTE — Telephone Encounter (Signed)
Patient called in earlier wondering where her neuropsych referral was sent since she had not heard from them yet. She was given the phone number for [facility]. She just called back to advise that they do not take her insurance.   She asked for a call back today to discuss this as she goes out of town tomorrow and wants to get this handled.  Thanks!

## 2022-01-12 NOTE — Telephone Encounter (Signed)
[name], try to resend her referral to [facility] phone number [phone] Fax [phone]

## 2022-01-12 NOTE — Telephone Encounter (Signed)
Have resent referral to [facility]

## 2022-02-07 ENCOUNTER — Ambulatory Visit (HOSPITAL_BASED_OUTPATIENT_CLINIC_OR_DEPARTMENT_OTHER): Payer: Medicare Other | Admitting: Cardiovascular Disease

## 2022-03-06 ENCOUNTER — Encounter (HOSPITAL_BASED_OUTPATIENT_CLINIC_OR_DEPARTMENT_OTHER): Payer: Self-pay | Admitting: Cardiovascular Disease

## 2022-03-06 ENCOUNTER — Ambulatory Visit (HOSPITAL_BASED_OUTPATIENT_CLINIC_OR_DEPARTMENT_OTHER): Payer: Medicare Other | Admitting: Cardiovascular Disease

## 2022-03-06 VITALS — BP 112/62 | HR 56

## 2022-03-06 DIAGNOSIS — I10 Essential (primary) hypertension: Secondary | ICD-10-CM

## 2022-03-06 DIAGNOSIS — I5181 Takotsubo syndrome: Secondary | ICD-10-CM

## 2022-03-06 DIAGNOSIS — E7800 Pure hypercholesterolemia, unspecified: Secondary | ICD-10-CM

## 2022-03-06 NOTE — Patient Instructions (Signed)
Medication Instructions:  Your physician recommends that you continue on your current medications as directed. Please refer to the Current Medication list given to you today.   *If you need a refill on your cardiac medications before your next appointment, please call your pharmacy*  Lab Work: NONE  Testing/Procedures: NONE  Follow-Up: At [facility], you and your health needs are our priority.  As part of our continuing mission to provide you with exceptional heart care, we have created designated Provider Care Teams.  These Care Teams include your primary Cardiologist (physician) and Advanced Practice Providers (APPs -  Physician Assistants and Nurse Practitioners) who all work together to provide you with the care you need, when you need it.  We recommend signing up for the patient portal called "MyChart".  Sign up information is provided on this After Visit Summary.  MyChart is used to connect with patients for Virtual Visits (Telemedicine).  Patients are able to view lab/test results, encounter notes, upcoming appointments, etc.  Non-urgent messages can be sent to your provider as well.   To learn more about what you can do with MyChart, go to [email or web address].    Your next appointment:   6 month(s)  The format for your next appointment:   In Person  Provider:   Tiffany Alleghany, MD       

## 2022-03-06 NOTE — Assessment & Plan Note (Signed)
Systolic function normalized.  She is euvolemic and BP controlled.  Continue torsemide.

## 2022-03-06 NOTE — Assessment & Plan Note (Signed)
BP well-controlled.  Amlodipine was stopped 2/2 polypharmacy and BP remains controlled.  Continue carvedilol and telmisartan. Edema has also improved.

## 2022-03-06 NOTE — Assessment & Plan Note (Signed)
Continue atorvastatin.  Lipids are well-controlled. 
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
## 2022-03-27 ENCOUNTER — Encounter
Payer: Medicare Other | Attending: Physical Medicine and Rehabilitation | Admitting: Physical Medicine and Rehabilitation

## 2022-05-22 ENCOUNTER — Telehealth (HOSPITAL_BASED_OUTPATIENT_CLINIC_OR_DEPARTMENT_OTHER): Payer: Self-pay | Admitting: Cardiovascular Disease

## 2022-05-22 NOTE — Telephone Encounter (Signed)
Returned call to patient who states she has noticed increased edema of lower legs and hands.  Pt states she is short of breath with no cough.  During call noted patient is able to speak in complete sentences with no shortness of breath noted (pt speaks rapidly providing a lot of information). Pt states she weighed "a week or 2 ago, "  and her weight was 165 lbs in Dr. Shann Medal office. Informed patient Probation officer will share information with Dr. Oval Linsey and her nurse. Pt agreed to plan.  Georgana Curio MHA RN CCM

## 2022-05-22 NOTE — Telephone Encounter (Signed)
Pt c/o swelling: STAT is pt has developed SOB within 24 hours  How much weight have you gained and in what time span? no  If swelling, where is the swelling located? Leg, hands   Are you currently taking a fluid pill? yes  Are you currently SOB? Yes (a week)  Do you have a log of your daily weights (if so, list)?    Have you gained 3 pounds in a day or 5 pounds in a week?   Have you traveled recently? no    [facility] patient on 1/12

## 2022-05-26 ENCOUNTER — Ambulatory Visit (HOSPITAL_BASED_OUTPATIENT_CLINIC_OR_DEPARTMENT_OTHER): Payer: Medicare Other | Admitting: Cardiovascular Disease

## 2022-05-26 ENCOUNTER — Encounter (HOSPITAL_BASED_OUTPATIENT_CLINIC_OR_DEPARTMENT_OTHER): Payer: Self-pay | Admitting: Cardiovascular Disease

## 2022-05-26 ENCOUNTER — Ambulatory Visit (INDEPENDENT_AMBULATORY_CARE_PROVIDER_SITE_OTHER): Payer: Medicare Other

## 2022-05-26 DIAGNOSIS — I10 Essential (primary) hypertension: Secondary | ICD-10-CM

## 2022-05-26 DIAGNOSIS — R600 Localized edema: Secondary | ICD-10-CM | POA: Diagnosis not present

## 2022-05-26 DIAGNOSIS — R0602 Shortness of breath: Secondary | ICD-10-CM

## 2022-05-26 DIAGNOSIS — I480 Paroxysmal atrial fibrillation: Secondary | ICD-10-CM

## 2022-05-26 LAB — ECHOCARDIOGRAM LIMITED
AR max vel: 2.06 cm2
AV Area VTI: 2.26 cm2
AV Area mean vel: 2.05 cm2
AV Mean grad: 3 mmHg
AV Peak grad: 5.6 mmHg
Ao pk vel: 1.18 m/s
Area-P 1/2: 2.85 cm2
Height: 60 in
S' Lateral: 2.53 cm
Weight: 2963.2 oz

## 2022-05-26 MED ORDER — SPIRONOLACTONE 25 MG PO TABS: ORAL_TABLET | ORAL | 1 refills | Status: DC

## 2022-05-26 NOTE — Patient Instructions (Signed)
Medication Instructions:  STOP AMLODIPINE   START SPIRONOLACTONE 25 MG 1/2 TABLET DAILY   *If you need a refill on your cardiac medications before your next appointment, please call your pharmacy*  Lab Work: BMET/BNP 1 WEEK   If you have labs (blood work) drawn today and your tests are completely normal, you will receive your results only by: [place] (if you have MyChart) OR A paper copy in the mail If you have any lab test that is abnormal or we need to change your treatment, we will call you to review the results.  Testing/Procedures: Your physician has requested that you have an echocardiogram. Echocardiography is a painless test that uses sound waves to create images of your heart. It provides your doctor with information about the size and shape of your heart and how well your heart's chambers and valves are working. This procedure takes approximately one hour. There are no restrictions for this procedure. Please do NOT wear cologne, perfume, aftershave, or lotions (deodorant is allowed). Please arrive 15 minutes prior to your appointment time.   Follow-Up: At [facility], you and your health needs are our priority.  As part of our continuing mission to provide you with exceptional heart care, we have created designated Provider Care Teams.  These Care Teams include your primary Cardiologist (physician) and Advanced Practice Providers (APPs -  Physician Assistants and Nurse Practitioners) who all work together to provide you with the care you need, when you need it.  We recommend signing up for the patient portal called "MyChart".  Sign up information is provided on this After Visit Summary.  MyChart is used to connect with patients for Virtual Visits (Telemedicine).  Patients are able to view lab/test results, encounter notes, upcoming appointments, etc.  Non-urgent messages can be sent to your provider as well.   To learn more about what you can do with MyChart, go to  [email or web address].    Your next appointment:   2 month(s)  Provider:   [name], NP

## 2022-05-26 NOTE — Telephone Encounter (Signed)
Patient to see Dr Oval Linsey today

## 2022-05-26 NOTE — Addendum Note (Signed)
Addended by: Alvina Filbert B on: 05/26/2022 10:57 AM   Modules accepted: Orders

## 2022-05-26 NOTE — Assessment & Plan Note (Signed)
Remote atrial fibrillation.  She is not had any clinical atrial fibrillation since that time.  No anticoagulation.
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
## 2022-05-26 NOTE — Assessment & Plan Note (Signed)
She continues to have lower extremity edema that I think is mostly dependent edema from being in the chair constantly.  Continue with lymphedema pumps.  We will check an echocardiogram as she does have a history of Takotsubo cardiomyopathy.  Continue torsemide.  Switching amlodipine to spironolactone as above.  Check BNP.

## 2022-05-26 NOTE — Assessment & Plan Note (Signed)
Her blood pressure has been running low.  She also continues to struggle with low risk extremity edema.  We will stop the amlodipine and start spironolactone 12.5 mg daily.  Check a BMP in a week.

## 2022-06-03 LAB — BRAIN NATRIURETIC PEPTIDE: BNP: 62.2 pg/mL (ref 0.0–100.0)

## 2022-06-03 LAB — BASIC METABOLIC PANEL
BUN/Creatinine Ratio: 40 — ABNORMAL HIGH (ref 12–28)
BUN: 19 mg/dL (ref 8–27)
CO2: 24 mmol/L (ref 20–29)
Calcium: 9.1 mg/dL (ref 8.7–10.3)
Chloride: 99 mmol/L (ref 96–106)
Creatinine, Ser: 0.48 mg/dL — ABNORMAL LOW (ref 0.57–1.00)
Glucose: 147 mg/dL — ABNORMAL HIGH (ref 70–99)
Potassium: 5.1 mmol/L (ref 3.5–5.2)
Sodium: 137 mmol/L (ref 134–144)
eGFR: 97 mL/min/{1.73_m2} (ref >59)

## 2022-06-04 ENCOUNTER — Emergency Department (HOSPITAL_COMMUNITY)
Admission: EM | Admit: 2022-06-04 | Discharge: 2022-06-04 | Disposition: A | Discharge status: Home / Self Care | Payer: Medicare Other | Attending: Emergency Medicine | Admitting: Emergency Medicine

## 2022-06-04 ENCOUNTER — Other Ambulatory Visit: Payer: Self-pay

## 2022-06-04 ENCOUNTER — Emergency Department (HOSPITAL_COMMUNITY): Payer: Medicare Other

## 2022-06-04 DIAGNOSIS — I10 Essential (primary) hypertension: Secondary | ICD-10-CM | POA: Diagnosis not present

## 2022-06-04 DIAGNOSIS — Z7982 Long term (current) use of aspirin: Secondary | ICD-10-CM | POA: Diagnosis not present

## 2022-06-04 DIAGNOSIS — Z79899 Other long term (current) drug therapy: Secondary | ICD-10-CM | POA: Insufficient documentation

## 2022-06-04 DIAGNOSIS — E119 Type 2 diabetes mellitus without complications: Secondary | ICD-10-CM | POA: Diagnosis not present

## 2022-06-04 DIAGNOSIS — I4891 Unspecified atrial fibrillation: Secondary | ICD-10-CM | POA: Diagnosis not present

## 2022-06-04 DIAGNOSIS — N3000 Acute cystitis without hematuria: Secondary | ICD-10-CM

## 2022-06-04 DIAGNOSIS — R109 Unspecified abdominal pain: Secondary | ICD-10-CM | POA: Diagnosis present

## 2022-06-04 LAB — URINALYSIS, ROUTINE W REFLEX MICROSCOPIC
Bilirubin Urine: NEGATIVE
Glucose, UA: NEGATIVE mg/dL
Hgb urine dipstick: NEGATIVE
Ketones, ur: NEGATIVE mg/dL
Nitrite: POSITIVE — AB
Protein, ur: NEGATIVE mg/dL
Specific Gravity, Urine: 1.012 (ref 1.005–1.030)
WBC, UA: >50 WBC/hpf — ABNORMAL HIGH (ref 0–5)
pH: 6 (ref 5.0–8.0)

## 2022-06-04 LAB — CBC
HCT: 35.8 % — ABNORMAL LOW (ref 36.0–46.0)
Hemoglobin: 11.6 g/dL — ABNORMAL LOW (ref 12.0–15.0)
MCH: 30.4 pg (ref 26.0–34.0)
MCHC: 32.4 g/dL (ref 30.0–36.0)
MCV: 94 fL (ref 80.0–100.0)
Platelets: 371 10*3/uL (ref 150–400)
RBC: 3.81 MIL/uL — ABNORMAL LOW (ref 3.87–5.11)
RDW: 14.2 % (ref 11.5–15.5)
WBC: 12.4 10*3/uL — ABNORMAL HIGH (ref 4.0–10.5)
nRBC: 0 % (ref 0.0–0.2)

## 2022-06-04 LAB — LIPASE, BLOOD: Lipase: 26 U/L (ref 11–51)

## 2022-06-04 LAB — COMPREHENSIVE METABOLIC PANEL
ALT: 17 U/L (ref 0–44)
AST: 21 U/L (ref 15–41)
Albumin: 3 g/dL — ABNORMAL LOW (ref 3.5–5.0)
Alkaline Phosphatase: 61 U/L (ref 38–126)
Anion gap: 6 (ref 5–15)
BUN: 17 mg/dL (ref 8–23)
CO2: 28 mmol/L (ref 22–32)
Calcium: 8.9 mg/dL (ref 8.9–10.3)
Chloride: 97 mmol/L — ABNORMAL LOW (ref 98–111)
Creatinine, Ser: 0.5 mg/dL (ref 0.44–1.00)
GFR, Estimated: >60 mL/min (ref >60)
Glucose, Bld: 112 mg/dL — ABNORMAL HIGH (ref 70–99)
Potassium: 4.5 mmol/L (ref 3.5–5.1)
Sodium: 131 mmol/L — ABNORMAL LOW (ref 135–145)
Total Bilirubin: 0.9 mg/dL (ref 0.3–1.2)
Total Protein: 6.8 g/dL (ref 6.5–8.1)

## 2022-06-04 LAB — LACTIC ACID, PLASMA: Lactic Acid, Venous: 1.3 mmol/L (ref 0.5–1.9)

## 2022-06-04 MED ORDER — LACTATED RINGERS IV BOLUS
1000.0000 mL | Freq: Once | INTRAVENOUS | Status: AC
  Administered 2022-06-04: 1000 mL via INTRAVENOUS

## 2022-06-04 MED ORDER — MORPHINE SULFATE (PF) 4 MG/ML IV SOLN: 4.0000 mg | Freq: Once | INTRAVENOUS | Status: DC

## 2022-06-04 MED ORDER — IOHEXOL 300 MG/ML  SOLN
100.0000 mL | Freq: Once | INTRAMUSCULAR | Status: AC | PRN
  Administered 2022-06-04: 100 mL via INTRAVENOUS

## 2022-06-04 MED ORDER — FENTANYL CITRATE PF 50 MCG/ML IJ SOSY
50.0000 ug | PREFILLED_SYRINGE | INTRAMUSCULAR | Status: AC | PRN
  Administered 2022-06-04 (×2): 50 ug via INTRAVENOUS
  Filled 2022-06-04 (×2): qty 1

## 2022-06-04 MED ORDER — ONDANSETRON HCL 4 MG/2ML IJ SOLN
4.0000 mg | Freq: Once | INTRAMUSCULAR | Status: AC
  Administered 2022-06-04: 4 mg via INTRAVENOUS
  Filled 2022-06-04: qty 2

## 2022-06-04 MED ORDER — MORPHINE SULFATE (PF) 4 MG/ML IV SOLN
4.0000 mg | Freq: Once | INTRAVENOUS | Status: AC
  Administered 2022-06-04: 4 mg via SUBCUTANEOUS
  Filled 2022-06-04: qty 1

## 2022-06-04 MED ORDER — CEPHALEXIN 500 MG PO CAPS: 500.0000 mg | ORAL_CAPSULE | Freq: Two times a day (BID) | ORAL | 0 refills | Status: AC

## 2022-06-04 MED ORDER — TRAMADOL HCL 50 MG PO TABS: 50.0000 mg | ORAL_TABLET | Freq: Four times a day (QID) | ORAL | 0 refills | Status: DC | PRN

## 2022-06-04 NOTE — Discharge Instructions (Addendum)
Thank you for letting us take care of you today.  Your workup today showed a urinary tract infection. I have printed the prescription for Keflex, an antibiotic, as we discussed. Please fill this and take as instructed.  A prescription for Ultram has been sent to [facility] for you to fill for pain control at home as well.  Please follow up with your PCP by the end of next week for recheck unless you do not notice an improvement in your symptoms in the next 24-48 hours and then please follow up sooner.  If you develop worsening abdominal pain, uncontrollable vomiting, high fevers, confusion, or other concerns, please return to nearest emergency department for re-evaluation.

## 2022-06-04 NOTE — ED Provider Notes (Signed)
I provided a substantive portion of the care of this patient.  I personally performed the entirety of the medical decision making for this encounter.  EKG Interpretation  Date/Time:  Sunday June 04 2022 06:30:52 EST Ventricular Rate:  72 PR Interval:  149 QRS Duration: 90 QT Interval:  395 QTC Calculation: 433 R Axis:   -6 Text Interpretation: Sinus rhythm Low voltage, precordial leads Nonspecific T abnormalities, anterior leads No significant change since last tracing Confirmed by Lacretia Leigh ([id number]) on 06/04/2022 10:34:06 AM   Patient presented with right-sided flank pain.  Has history of UTIs and is seen by Dr. Lawerance Bach from neurology.  Urinalysis positive for infection.  CT scan showed no evidence of stone.  She has no overt evidence of pyelonephritis at this time.  Will treat with pain medication and antibiotics and return precautions given.   Lacretia Leigh, MD 06/04/22 1249

## 2022-06-04 NOTE — ED Provider Notes (Signed)
[name]   CSN: [id number] Arrival date & time: 06/04/22  0547     History  Chief Complaint  Patient presents with   Flank [name]    Kathy [name] is a 77 y.o. female past medical history hypertension, Kathy [name], Kathy [name], Kathy [name], Kathy [name], Kathy [name] 6 hours ago.  Patient states that [name] is intermittent and will radiate into the right lower abdomen as well.  Patient reports that prior abdominal surgeries include a cholecystectomy.  She also reports some urinary symptoms but states that these are not uncommon for her so she is unsure if they are related.  Mild dysuria, urinary urgency, and urinary frequency.  No recent treatment for UTI.  She does also have a history of kidney stones.  No nausea, vomiting, diarrhea, chest [name], shortness of breath, diarrhea, or recent illness.   Home Medications Prior to Admission medications   Medication Sig Start Date End Date Taking? Authorizing Provider  cephALEXin (KEFLEX) 500 MG capsule Take 1 capsule (500 mg total) by mouth 2 (two) times daily for 7 days. 06/04/22 06/11/22 Yes Norelle Runnion L, PA-C  traMADol (ULTRAM) 50 MG tablet Take 1 tablet (50 mg total) by mouth every 6 (six) hours as needed. 06/04/22  Yes Lacretia Leigh, MD  amitriptyline (ELAVIL) 25 MG tablet     [provider]  ascorbic acid (VITAMIN C) 500 MG tablet Take 500 mg by mouth daily.    [provider]  aspirin 81 MG tablet Take 81 mg by mouth at bedtime.     [provider]  atorvastatin (LIPITOR) 40 MG tablet Take 40 mg by mouth daily.    [provider]  b complex vitamins tablet Take 1 tablet by mouth daily.    [provider]  Calcium Carbonate-Vitamin D 600-400 MG-UNIT chew tablet Chew 1 tablet by mouth 2 (two) times daily.     [provider]  carvedilol (COREG) 25 MG tablet carvedilol 25 mg tablet    [provider]  clonazePAM (KLONOPIN) 0.5 MG tablet Take 0.5 mg by mouth 2 (two) times daily.    [provider]  gabapentin (NEURONTIN) 300 MG capsule Take 300 mg by mouth at bedtime.    [provider]  Iron, Ferrous Sulfate, 325 (65 Fe) MG TABS Take 1 tablet by mouth daily.    [provider]  levothyroxine (SYNTHROID, LEVOTHROID) 100 MCG tablet Take 100 mcg by mouth daily before breakfast.    [provider]  loratadine (CLARITIN) 10 MG tablet Take 10 mg by mouth daily. 12/17/19   [provider]  metFORMIN (GLUCOPHAGE-XR) 500 MG 24 hr tablet Take 1 tablet by mouth in the morning and at bedtime.  12/24/18   [provider]  mirabegron ER (MYRBETRIQ) 50 MG TB24 tablet Take 50 mg by mouth every other day.     [provider]  Multiple Vitamins-Minerals (MULTIVITAMIN WITH MINERALS) tablet Take 1 tablet by mouth daily.    [provider]  nitroGLYCERIN (NITROSTAT) 0.4 MG SL tablet Place 1 tablet (0.4 mg total) under the tongue every 5 (five) minutes as needed for chest [name]. 08/02/21   Loel Dubonnet, NP  pantoprazole (PROTONIX) 40 MG tablet Take 1 tablet by mouth daily. Take 1 tab daily 12/21/14   [provider]  spironolactone (ALDACTONE) 25 MG tablet  TAKE 1/2 TABLET DAILY 05/26/22   Skeet Latch, MD  telmisartan (MICARDIS) 80 MG tablet Take 1 tablet by mouth daily. Take 1 tab daily 12/14/14   [provider]  tiZANidine (ZANAFLEX) 4 MG tablet Take 4 mg by mouth 5 (five) times daily as needed for muscle spasms. [name]    [provider]  torsemide (DEMADEX) 5 MG tablet Take 5 mg by mouth daily.    [provider]  vitamin E 180 MG (400 UNITS) capsule Take 400 Units by mouth daily.    [provider]  Zinc 50 MG TABS Take 50 mg by mouth daily.    [provider]  zolpidem (AMBIEN) 5 MG tablet  Take 5 mg by mouth at bedtime as needed for sleep.    [provider]      Allergies    Tamsulosin, Ciprofloxacin, Carisoprodol-aspirin, Penicillins, Sulfa antibiotics, Acetaminophen, Aspirin, Macrodantin [nitrofurantoin macrocrystal], Nsaids, Percocet [oxycodone-acetaminophen], Propoxyphene, Propoxyphene n-acetaminophen, Soma compound [carisoprodol-aspirin], and Tolectin [tolmetin]    Review of Systems   Review of Systems  Constitutional:  Negative for activity change, appetite change, chills and fever.  HENT:  Negative for congestion, ear [name], rhinorrhea and sore throat.   Eyes:  Negative for [name] and visual disturbance.  Respiratory:  Negative for cough, chest tightness and shortness of breath.   Cardiovascular:  Negative for chest [name], palpitations and leg swelling.  Gastrointestinal:  Negative for abdominal [name], blood in stool, constipation, diarrhea, nausea and vomiting.  Genitourinary:  Positive for dysuria, flank [name], frequency and urgency. Negative for hematuria.  Musculoskeletal:  Negative for arthralgias.  Skin:  Negative for color change and rash.  Neurological:  Negative for dizziness, seizures, syncope, weakness, light-headedness and headaches.  All other systems reviewed and are negative.   Physical Exam Updated Vital Signs BP 97/66   Pulse (!) 58   Temp 98.1 F (36.7 C) (Oral)   Resp 16   Ht 5' (1.524 m)   Wt 82 kg   SpO2 97%   BMI 35.31 kg/m  Physical Exam Vitals and nursing note reviewed.  Constitutional:      Appearance: Normal appearance.     Comments: No acute distress but uncomfortable appearing secondary to [name]  HENT:     Head: Normocephalic and atraumatic.     Mouth/Throat:     Mouth: Mucous membranes are moist.  Eyes:     General: No scleral icterus.    Conjunctiva/sclera: Conjunctivae normal.  Cardiovascular:     Rate and Rhythm: Normal rate and regular rhythm.     Heart sounds: No murmur heard. Pulmonary:     Effort: Pulmonary  effort is normal.     Breath sounds: Normal breath sounds.  Abdominal:     General: Abdomen is flat. There is no distension.     Palpations: Abdomen is soft.     Tenderness: There is abdominal tenderness (RLQ and suprapubic). There is no right CVA tenderness, left CVA tenderness, guarding or rebound.  Musculoskeletal:        General: Normal range of motion.     Cervical back: Neck supple.     Right lower leg: No edema.     Left lower leg: No edema.  Skin:    General: Skin is warm and dry.     Capillary Refill: Capillary refill takes less than 2 seconds.  Neurological:     Mental Status: She is alert. Mental status is at baseline.  Psychiatric:        Mood  and Affect: Mood normal.        Behavior: Behavior normal.     ED Results / Procedures / Treatments   Labs (all labs ordered are listed, but only abnormal results are displayed) Labs Reviewed  COMPREHENSIVE METABOLIC PANEL - Abnormal; Notable for the following components:      Result Value   Sodium 131 (*)    Chloride 97 (*)    Glucose, Bld 112 (*)    Albumin 3.0 (*)    All other components within normal limits  CBC - Abnormal; Notable for the following components:   WBC 12.4 (*)    RBC 3.81 (*)    Hemoglobin 11.6 (*)    HCT 35.8 (*)    All other components within normal limits  URINALYSIS, ROUTINE W REFLEX MICROSCOPIC - Abnormal; Notable for the following components:   APPearance CLOUDY (*)    Nitrite POSITIVE (*)    Leukocytes,Ua LARGE (*)    WBC, UA >50 (*)    Bacteria, UA RARE (*)    All other components within normal limits  LIPASE, BLOOD  LACTIC ACID, PLASMA    EKG EKG Interpretation  Date/Time:  Sunday June 04 2022 06:30:52 EST Ventricular Rate:  72 PR Interval:  149 QRS Duration: 90 QT Interval:  395 QTC Calculation: 433 R Axis:   -6 Text Interpretation: Sinus rhythm Low voltage, precordial leads Nonspecific T abnormalities, anterior leads No significant change since last tracing Confirmed by  Lacretia Leigh ([id number]) on 06/04/2022 10:34:06 AM  Radiology CT ABDOMEN PELVIS W CONTRAST  Result Date: 06/04/2022 CLINICAL DATA:  Flank [name]. EXAM: CT ABDOMEN AND PELVIS WITH CONTRAST TECHNIQUE: Multidetector CT imaging of the abdomen and pelvis was performed using the standard protocol following bolus administration of intravenous contrast. RADIATION DOSE REDUCTION: This exam was performed according to the departmental dose-optimization program which includes automated exposure control, adjustment of the mA and/or kV according to patient size and/or use of iterative reconstruction technique. CONTRAST:  138m OMNIPAQUE IOHEXOL 300 MG/ML  SOLN COMPARISON:  02/07/2018 FINDINGS: Lower chest: Very minimal dependent bibasilar atelectasis as the lung bases are otherwise unremarkable. Minimal calcified plaque over the descending thoracic aorta. Hepatobiliary: Previous cholecystectomy. Liver and biliary tree are normal. Pancreas: Normal. Spleen: Normal. Adrenals/Urinary Tract: Adrenal glands are normal. Kidneys are normal in size without hydronephrosis or nephrolithiasis. Ureters and bladder are normal. Stomach/Bowel: Mild prominence of the gastric wall over the proximal to mid body of the stomach which is unchanged to slightly improved compared to the prior exam. These findings could be due to Kathy gastritis. Small bowel is normal. Appendix not visualized. Colon is normal. Vascular/Lymphatic: Mild-to-moderate calcified plaque over the abdominal aorta which is normal in caliber. Remaining vascular structures are unremarkable. No adenopathy. Reproductive: Prior hysterectomy.  Ovaries are normal. Other: No free fluid or focal inflammatory change. Musculoskeletal: Degenerative changes of the spine with moderate curvature of the thoracolumbar spine. Degenerative changes of the hips. Small amount of fluid over the left hip joint. IMPRESSION: 1. No acute findings in the abdomen/pelvis. 2. Mild prominence of the gastric  wall over the proximal to mid body of the stomach which is unchanged to slightly improved compared to the prior exam. These findings could be due to Kathy gastritis. 3. Aortic atherosclerosis. Aortic Atherosclerosis (ICD10-I70.0). Electronically Signed   By: DMarin OlpM.D.   On: 06/04/2022 08:36    Procedures Procedures    Medications Ordered in ED Medications  morphine (PF) 4 MG/ML injection 4 mg (has no administration  in time range)  fentaNYL (SUBLIMAZE) injection 50 mcg (50 mcg Intravenous Given 06/04/22 0827)  ondansetron (ZOFRAN) injection 4 mg (4 mg Intravenous Given 06/04/22 2440)  lactated ringers bolus 1,000 mL (0 mLs Intravenous Stopped 06/04/22 1038)  iohexol (OMNIPAQUE) 300 MG/ML solution 100 mL (100 mLs Intravenous Contrast Given 06/04/22 0759)  morphine (PF) 4 MG/ML injection 4 mg (4 mg Subcutaneous Given 06/04/22 1040)    ED Course/ Medical Decision Making/ A&P                             Medical Decision Making Amount and/or Complexity of Data Reviewed Labs: ordered. Decision-making details documented in ED Course. Radiology: ordered. Decision-making details documented in ED Course.  Risk Prescription drug management.   This is a 77 year old female presenting to ED with acute right flank and lower abdominal [name] onset last night with report of urinary symptoms as well. The causes of abdominal [name] include but are not limited to AAA, mesenteric ischemia, appendicitis, diverticulitis, DKA, gastritis, gastroenteritis, AMI, nephrolithiasis, pancreatitis, peritonitis, intestinal ischemia, constipation, UTI, pyelonephritis, SBO/LBO, splenic rupture, biliary disease, IBD, IBS, PUD, or hepatitis. EKG NSR with no acute ST-T changes concerning for STEMI and pt with no chest [name] or other symptoms typical for ischemia. She did have reported history of kidney stones so this was high on differential. She also reported history of urinary tract infections though has not had Kathy in  about Kathy year. She does not meet sepsis criteria and with a normal lactate, very low suspicion for mesenteric ischemia or other severe disease. On my initial and repeat examinations, she is non-toxic appearing with mild RLQ and moderate suprapubic tenderness but no CVA tenderness, no tachycardia. Mild leukocytosis at 12.4, CT AP without acute findings. Extensive ED stay due to pending UA, contacted lab and specimen had been placed in the fridge and UA was not initiated until this call, however, unable to disposition without this specimen. On multiple re-examinations, pt increasingly c/o burning with urination, frequency, and [name] over suprapubic abdomen vs right flank as described earlier in visit. UA consistent with acute UTI. Pt has multiple [name] and antibiotic allergies listed. Reviewed this with pt and she confirmed she is able to take Keflex without a reaction. Typically, would treat for complicated UTI in this age group, however, with inability to confirm tolerability of antibiotics such as cipro and cefpodoxime, we will proceed with Keflex at this time and pt given strict instruction to follow up closely with primary care and return to ED for any new or worsening symptoms. Pt/husband expressed understanding of this plan. Reviewed management with attending MD who also evaluated pt and is in agreement with plan. Pt given morphine dos x 2 for [name] control and prescription for short course of Ultram also sent to pharmacy. All questions answered and pt stable for discharge.          Final Clinical Impression(s) / ED Diagnoses Final diagnoses:  Acute cystitis without hematuria    Rx / DC Orders ED Discharge Orders          Ordered    cephALEXin (KEFLEX) 500 MG capsule  2 times daily        06/04/22 1252    traMADol (ULTRAM) 50 MG tablet  Every 6 hours PRN        06/04/22 1254              Shalaya Swailes, Arvella Merles, PA-C 06/04/22 1317  Lacretia Leigh, MD 06/05/22 1325

## 2022-06-04 NOTE — ED Provider Notes (Signed)
Given that shift changes tenderness from now I went and took a brief history for patient.  And ordered labs and imaging.  Patient is a 77 year old female with past medical history significant for nephrolithiasis, atrial fibrillation on aspirin, hypothyroidism, DM2   Patient brought into ER by EMS from home complaining of right flank pain that radiates to her right lower abdomen that began overnight between 12 AM and 1 AM.  She states the pain has been constant since it is waxed and waned slightly she states that she has had some intermittent episodes of urinary frequency urgency dysuria she is uncertain of blood in her urine.  She denies any chest pain or difficulty breathing.  Her pain in her flank is not worsened or improved with breathing.   Physical exam  CONSTITUTIONAL:  well-appearing, but uncomfortable NEURO:  Alert and oriented x 3, conversant EYES:  pupils equal and reactive ENT/NECK:  trachea midline, no JVD CARDIO:  reg rate, well-perfused PULM:  None labored breathing GI/GU:  Abdomen non-distended, tender in right lower quadrant and right periumbilical with right CVA tenderness and some paravertebral muscular tenderness on the right side. MSK/SPINE:  No gross deformities, no edema SKIN:  no rash obvious, atraumatic, no ecchymosis  PSYCH:  Appropriate speech and behavior   CT abdomen pelvis with contrast, basic labs, fentanyl Zofran gentle fluids.   Pati Gallo [place], [place] 03/17/14 [id number]    Delora Fuel, MD 59/29/24 [phone]

## 2022-06-04 NOTE — ED Triage Notes (Signed)
Pt bib gems from home. Pt c/o right flank pain the radiates down hip. Pt hx of kidney stones, arthritis. Pt aox4. Pain started lastnight. 108/72 62HR 16RR 93% RA

## 2022-06-05 ENCOUNTER — Other Ambulatory Visit: Payer: Self-pay

## 2022-06-05 ENCOUNTER — Emergency Department (HOSPITAL_COMMUNITY)
Admission: EM | Admit: 2022-06-05 | Discharge: 2022-06-05 | Disposition: A | Discharge status: Home / Self Care | Payer: Medicare Other | Attending: Emergency Medicine | Admitting: Emergency Medicine

## 2022-06-05 ENCOUNTER — Encounter (HOSPITAL_COMMUNITY): Payer: Self-pay

## 2022-06-05 DIAGNOSIS — Z7984 Long term (current) use of oral hypoglycemic drugs: Secondary | ICD-10-CM | POA: Insufficient documentation

## 2022-06-05 DIAGNOSIS — R531 Weakness: Secondary | ICD-10-CM

## 2022-06-05 DIAGNOSIS — Z7982 Long term (current) use of aspirin: Secondary | ICD-10-CM | POA: Diagnosis not present

## 2022-06-05 LAB — CBC WITH DIFFERENTIAL/PLATELET
Abs Immature Granulocytes: 0.07 10*3/uL (ref 0.00–0.07)
Basophils Absolute: 0.1 10*3/uL (ref 0.0–0.1)
Basophils Relative: 1 %
Eosinophils Absolute: 0.7 10*3/uL — ABNORMAL HIGH (ref 0.0–0.5)
Eosinophils Relative: 6 %
HCT: 37.6 % (ref 36.0–46.0)
Hemoglobin: 11.8 g/dL — ABNORMAL LOW (ref 12.0–15.0)
Immature Granulocytes: 1 %
Lymphocytes Relative: 27 %
Lymphs Abs: 3.1 10*3/uL (ref 0.7–4.0)
MCH: 30.6 pg (ref 26.0–34.0)
MCHC: 31.4 g/dL (ref 30.0–36.0)
MCV: 97.7 fL (ref 80.0–100.0)
Monocytes Absolute: 1.3 10*3/uL — ABNORMAL HIGH (ref 0.1–1.0)
Monocytes Relative: 11 %
Neutro Abs: 6.3 10*3/uL (ref 1.7–7.7)
Neutrophils Relative %: 54 %
Platelets: 412 10*3/uL — ABNORMAL HIGH (ref 150–400)
RBC: 3.85 MIL/uL — ABNORMAL LOW (ref 3.87–5.11)
RDW: 14.3 % (ref 11.5–15.5)
WBC: 11.5 10*3/uL — ABNORMAL HIGH (ref 4.0–10.5)
nRBC: 0 % (ref 0.0–0.2)

## 2022-06-05 LAB — BASIC METABOLIC PANEL
Anion gap: 7 (ref 5–15)
BUN: 15 mg/dL (ref 8–23)
CO2: 28 mmol/L (ref 22–32)
Calcium: 8.8 mg/dL — ABNORMAL LOW (ref 8.9–10.3)
Chloride: 99 mmol/L (ref 98–111)
Creatinine, Ser: 0.64 mg/dL (ref 0.44–1.00)
GFR, Estimated: >60 mL/min (ref >60)
Glucose, Bld: 105 mg/dL — ABNORMAL HIGH (ref 70–99)
Potassium: 4.9 mmol/L (ref 3.5–5.1)
Sodium: 134 mmol/L — ABNORMAL LOW (ref 135–145)

## 2022-06-05 LAB — URINALYSIS, ROUTINE W REFLEX MICROSCOPIC
Bilirubin Urine: NEGATIVE
Glucose, UA: NEGATIVE mg/dL
Hgb urine dipstick: NEGATIVE
Ketones, ur: NEGATIVE mg/dL
Nitrite: NEGATIVE
Protein, ur: NEGATIVE mg/dL
Specific Gravity, Urine: 1.008 (ref 1.005–1.030)
WBC, UA: >50 WBC/hpf — ABNORMAL HIGH (ref 0–5)
pH: 5 (ref 5.0–8.0)

## 2022-06-05 LAB — CBG MONITORING, ED: Glucose-Capillary: 100 mg/dL — ABNORMAL HIGH (ref 70–99)

## 2022-06-05 MED ORDER — SODIUM CHLORIDE 0.9 % IV BOLUS
500.0000 mL | Freq: Once | INTRAVENOUS | Status: AC
  Administered 2022-06-05: 500 mL via INTRAVENOUS

## 2022-06-05 NOTE — Discharge Instructions (Addendum)
Please follow-up with your primary care doctor, make sure you are drinking lots of fluids, and return to the ER if you get lightheaded, dizzy, weak, or have severe nausea or vomiting.

## 2022-06-05 NOTE — ED Provider Notes (Signed)
[facility] Provider Note   CSN: [id number] Arrival date & time: 06/05/22  1348     History  Chief Complaint  Patient presents with   Cystitis    [name] is a 77 y.o. female, who presents to the ED secondary to an episode of weakness today, where she called the paramedics because she just felt very weak.  States that she was at the hospital yesterday was not allowed to eat or drink, went home and slept for a long time, and then woke up and took a muscle relaxer and just felt weak and afterwards.  Denies any dizziness, chest pain, shortness of breath, or abdominal pain.  States that she has not eaten or drank since being at the hospital.  Her friends gave her a biscuit this morning but she did not want to eat it since her stomach was upset.  States abdominal pain is improving.     Home Medications Prior to Admission medications   Medication Sig Start Date End Date Taking? Authorizing Provider  amitriptyline (ELAVIL) 25 MG tablet     [provider]  ascorbic acid (VITAMIN C) 500 MG tablet Take 500 mg by mouth daily.    [provider]  aspirin 81 MG tablet Take 81 mg by mouth at bedtime.     [provider]  atorvastatin (LIPITOR) 40 MG tablet Take 40 mg by mouth daily.    [provider]  b complex vitamins tablet Take 1 tablet by mouth daily.    [provider]  Calcium Carbonate-Vitamin D 600-400 MG-UNIT chew tablet Chew 1 tablet by mouth 2 (two) times daily.     [provider]  carvedilol (COREG) 25 MG tablet carvedilol 25 mg tablet    [provider]  cephALEXin (KEFLEX) 500 MG capsule Take 1 capsule (500 mg total) by mouth 2 (two) times daily for 7 days. 06/04/22 06/11/22  Gowens, Mariah L, PA-C  clonazePAM (KLONOPIN) 0.5 MG tablet Take 0.5 mg by mouth 2 (two) times daily.    [provider]  gabapentin (NEURONTIN) 300 MG capsule Take 300 mg by mouth at  bedtime.    [provider]  Iron, Ferrous Sulfate, 325 (65 Fe) MG TABS Take 1 tablet by mouth daily.    [provider]  levothyroxine (SYNTHROID, LEVOTHROID) 100 MCG tablet Take 100 mcg by mouth daily before breakfast.    [provider]  loratadine (CLARITIN) 10 MG tablet Take 10 mg by mouth daily. 12/17/19   [provider]  metFORMIN (GLUCOPHAGE-XR) 500 MG 24 hr tablet Take 1 tablet by mouth in the morning and at bedtime.  12/24/18   [provider]  mirabegron ER (MYRBETRIQ) 50 MG TB24 tablet Take 50 mg by mouth every other day.     [provider]  Multiple Vitamins-Minerals (MULTIVITAMIN WITH MINERALS) tablet Take 1 tablet by mouth daily.    [provider]  nitroGLYCERIN (NITROSTAT) 0.4 MG SL tablet Place 1 tablet (0.4 mg total) under the tongue every 5 (five) minutes as needed for chest pain. 08/02/21   Loel Dubonnet, NP  pantoprazole (PROTONIX) 40 MG tablet Take 1 tablet by mouth daily. Take 1 tab daily 12/21/14   [provider]  spironolactone (ALDACTONE) 25 MG tablet TAKE 1/2 TABLET DAILY 05/26/22   Skeet Latch, MD  telmisartan (MICARDIS) 80 MG tablet Take 1 tablet by mouth daily. Take 1 tab daily 12/14/14   [provider]  tiZANidine (ZANAFLEX) 4 MG tablet Take 4 mg by mouth 5 (five) times daily as needed for muscle spasms. PAIN    [provider]  torsemide (DEMADEX) 5 MG tablet Take 5 mg by mouth daily.    [provider]  traMADol (ULTRAM) 50 MG tablet Take 1 tablet (50 mg total) by mouth every 6 (six) hours as needed. 06/04/22   Lacretia Leigh, MD  vitamin E 180 MG (400 UNITS) capsule Take 400 Units by mouth daily.    [provider]  Zinc 50 MG TABS Take 50 mg by mouth daily.    [provider]  zolpidem (AMBIEN) 5 MG tablet Take 5 mg by mouth at bedtime as needed for sleep.    [provider]      Allergies    Tamsulosin, Ciprofloxacin,  Carisoprodol-aspirin, Penicillins, Sulfa antibiotics, Acetaminophen, Aspirin, Macrodantin [nitrofurantoin macrocrystal], Nsaids, Percocet [oxycodone-acetaminophen], Propoxyphene, Propoxyphene n-acetaminophen, Soma compound [carisoprodol-aspirin], and Tolectin [tolmetin]    Review of Systems   Review of Systems  Respiratory:  Negative for shortness of breath.   Gastrointestinal:  Positive for nausea. Negative for abdominal pain.    Physical Exam Updated Vital Signs BP (!) 112/45   Pulse 64   Temp 97.8 F (36.6 C)   Resp (!) 9   Ht 5' (1.524 m)   Wt 81.6 kg   SpO2 100%   BMI 35.15 kg/m  Physical Exam Vitals and nursing note reviewed.  Constitutional:      General: She is not in acute distress.    Appearance: She is well-developed.  HENT:     Head: Normocephalic and atraumatic.  Eyes:     Conjunctiva/sclera: Conjunctivae normal.  Cardiovascular:     Rate and Rhythm: Normal rate and regular rhythm.     Heart sounds: No murmur heard. Pulmonary:     Effort: Pulmonary effort is normal. No respiratory distress.     Breath sounds: Normal breath sounds.  Abdominal:     Palpations: Abdomen is soft.     Tenderness: There is no abdominal tenderness.  Musculoskeletal:        General: No swelling.     Cervical back: Neck supple.  Skin:    General: Skin is warm and dry.     Capillary Refill: Capillary refill takes less than 2 seconds.  Neurological:     Mental Status: She is alert.  Psychiatric:        Mood and Affect: Mood normal.     ED Results / Procedures / Treatments   Labs (all labs ordered are listed, but only abnormal results are displayed) Labs Reviewed  BASIC METABOLIC PANEL - Abnormal; Notable for the following components:      Result Value   Sodium 134 (*)    Glucose, Bld 105 (*)    Calcium 8.8 (*)    All other components within normal limits  CBC WITH DIFFERENTIAL/PLATELET - Abnormal; Notable for the following components:   WBC 11.5 (*)    RBC 3.85 (*)     Hemoglobin 11.8 (*)    Platelets 412 (*)    Monocytes Absolute 1.3 (*)    Eosinophils Absolute 0.7 (*)    All other components within normal limits  CBG MONITORING, ED - Abnormal; Notable for the following components:   Glucose-Capillary 100 (*)    All other components within normal limits  URINE CULTURE  URINALYSIS, ROUTINE W REFLEX MICROSCOPIC    EKG None  Radiology CT ABDOMEN PELVIS W CONTRAST  Result Date:  06/04/2022 CLINICAL DATA:  Flank pain. EXAM: CT ABDOMEN AND PELVIS WITH CONTRAST TECHNIQUE: Multidetector CT imaging of the abdomen and pelvis was performed using the standard protocol following bolus administration of intravenous contrast. RADIATION DOSE REDUCTION: This exam was performed according to the departmental dose-optimization program which includes automated exposure control, adjustment of the mA and/or kV according to patient size and/or use of iterative reconstruction technique. CONTRAST:  124m OMNIPAQUE IOHEXOL 300 MG/ML  SOLN COMPARISON:  02/07/2018 FINDINGS: Lower chest: Very minimal dependent bibasilar atelectasis as the lung bases are otherwise unremarkable. Minimal calcified plaque over the descending thoracic aorta. Hepatobiliary: Previous cholecystectomy. Liver and biliary tree are normal. Pancreas: Normal. Spleen: Normal. Adrenals/Urinary Tract: Adrenal glands are normal. Kidneys are normal in size without hydronephrosis or nephrolithiasis. Ureters and bladder are normal. Stomach/Bowel: Mild prominence of the gastric wall over the proximal to mid body of the stomach which is unchanged to slightly improved compared to the prior exam. These findings could be due to chronic gastritis. [name] bowel is normal. Appendix not visualized. Colon is normal. Vascular/Lymphatic: Mild-to-moderate calcified plaque over the abdominal aorta which is normal in caliber. Remaining vascular structures are unremarkable. No adenopathy. Reproductive: Prior hysterectomy.  Ovaries are normal.  Other: No free fluid or focal inflammatory change. Musculoskeletal: Degenerative changes of the spine with moderate curvature of the thoracolumbar spine. Degenerative changes of the hips. [name] amount of fluid over the left hip joint. IMPRESSION: 1. No acute findings in the abdomen/pelvis. 2. Mild prominence of the gastric wall over the proximal to mid body of the stomach which is unchanged to slightly improved compared to the prior exam. These findings could be due to chronic gastritis. 3. Aortic atherosclerosis. Aortic Atherosclerosis (ICD10-I70.0). Electronically Signed   By: DMarin OlpM.D.   On: 06/04/2022 08:36    Procedures Procedures    Medications Ordered in ED Medications  sodium chloride 0.9 % bolus 500 mL (has no administration in time range)    ED Course/ Medical Decision Making/ A&P                             Medical Decision Making Patient is a 77year old female, here for weakness earlier today, but now improved.  States she has not eaten, and took a muscle relaxer this a.m. which she typically does not do, notes that she called the paramedics because she felt very weak and her blood pressure was low.  Denies any chest pain, shortness of breath, current weakness, nausea, vomiting, diarrhea, abdominal pain.  She states that she did have suprapubic pain yesterday which has now improved.  We will obtain basic labs, urine culture, and CBC and CMP for further evaluation.  Additionally we will obtain EKG. urine culture was not obtained yesterday.  Will also give her some IV fluids and a sandwich to help with her weakness.  Amount and/or Complexity of Data Reviewed Labs: ordered.    Details: Creatinine within normal limits, CBC downtrending from 12-11.5k now ECG/medicine tests:     Details: NSR, 66bpm, Qtc 4272mDiscussion of management or test interpretation with external provider(s): Patient is a [age]ear old female, here for weakness, she has no neurodeficits, that are new, she  does have bilateral lower extremity weakness, which she states is chronic from polio.  QTc is within normal limits, CBC is downtrending, we will obtain a urine culture as this was not obtained yesterday.  She will need to complete her antibiotics.  She is well-appearing  overall, I believe that her symptoms are likely secondary to poor p.o. intake, and use of muscle relaxers.  She will receive some IV fluids, review valve pending Dr. Regenia Skeeter.    Final Clinical Impression(s) / ED Diagnoses Final diagnoses:  Weakness    Rx / DC Orders ED Discharge Orders     None         Diamantina Monks, Si Gaul, PA 06/05/22 1911    Sherwood Gambler, MD 06/05/22 2012

## 2022-06-05 NOTE — ED Provider Triage Note (Signed)
Emergency Medicine Provider Triage Evaluation Note  [name] , a 77 y.o. female  was evaluated in triage.  Pt complains of concerns for cystitis. Was seen at [facility] yesterday and diagnosed with UTI and treated with abx and discharged home. No stone noted on renal CT. patient notes that she thinks she might of taken an additional dose of her tramadol.  She has been compliant with her antibiotics for her acute cystitis.  EMS notes that patient was found to be hypotensive.  Patient denies hitting her head or LOC.   Pt was given 500 mL IVF by EMS.   Review of Systems  Positive:  Negative:   Physical Exam  BP 106/73   Pulse 62   Temp 97.8 F (36.6 C)   Resp 20   Ht 5' (1.524 m)   Wt 81.6 kg   SpO2 99%   BMI 35.15 kg/m  Gen:   Awake, no distress   Resp:  Normal effort  MSK:   Moves extremities without difficulty  Other:  No abdominal TTP.   Medical Decision Making  Medically screening exam initiated at 2:31 PM.  Appropriate orders placed.  [name] was informed that the remainder of the evaluation will be completed by another provider, this initial triage assessment does not replace that evaluation, and the importance of remaining in the ED until their evaluation is complete.  Work-up initiated.    Dontea Corlew A, PA-C 06/05/22 1444

## 2022-06-05 NOTE — ED Notes (Signed)
Pt ambulated to bathroom with assistance. Bed back in bed with bed in the lowest position and call light within reach.

## 2022-06-05 NOTE — ED Triage Notes (Signed)
Patient was discharged yesterday from [facility] dx with cystitis and prescribed ultram and keflex.  EMS brought patient in bc another medic found her to be hypotensive and hyperglycemia but never gave [employer] numbers and when she took vitals and cbg everything was normal.  20g RH

## 2022-06-07 LAB — URINE CULTURE

## 2022-06-14 ENCOUNTER — Ambulatory Visit: Payer: Medicare Other | Admitting: Family Medicine

## 2022-06-14 ENCOUNTER — Encounter: Payer: Self-pay | Admitting: Family Medicine

## 2022-06-14 VITALS — BP 174/80 | HR 76

## 2022-06-14 DIAGNOSIS — R413 Other amnesia: Secondary | ICD-10-CM | POA: Diagnosis not present
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
## 2022-06-14 NOTE — Patient Instructions (Signed)
Below is our plan:  We will continue to monitor. Please call [facility] to schedule neurocognitive evaluation.  [phone]. We will follow up pending their review.   Ask Dr Francesco Sor about gabapentin and amitriptyline. Do you still need these?   Please make sure you are staying well hydrated. I recommend 50-60 ounces daily. Well balanced diet and regular exercise encouraged. Consistent sleep schedule with 6-8 hours recommended.   Please continue follow up with care team as directed.   Follow up with me pending neurocognitive evaluation.   You may receive a survey regarding today's visit. I encourage you to leave honest feed back as I do use this information to improve patient care. Thank you for seeing me today!   Management of Memory Problems   There are some general things you can do to help manage your memory problems.  Your memory may not in fact recover, but by using techniques and strategies you will be able to manage your memory difficulties better.   1)  Establish a routine. Try to establish and then stick to a regular routine.  By doing this, you will get used to what to expect and you will reduce the need to rely on your memory.  Also, try to do things at the same time of day, such as taking your medication or checking your calendar first thing in the morning. Think about think that you can do as a part of a regular routine and make a list.  Then enter them into a daily planner to remind you.  This will help you establish a routine.   2)  Organize your environment. Organize your environment so that it is uncluttered.  Decrease visual stimulation.  Place everyday items such as keys or cell phone in the same place every day (ie.  Basket next to front door) Use post it notes with a brief message to yourself (ie. Turn off light, lock the door) Use labels to indicate where things go (ie. Which cupboards are for food, dishes, etc.) Keep a notepad and pen by the telephone to  take messages   3)  Memory Aids A diary or journal/notebook/daily planner Making a list (shopping list, chore list, to do list that needs to be done) Using an alarm as a reminder (kitchen timer or cell phone alarm) Using cell phone to store information (Notes, Calendar, Reminders) Calendar/White board placed in a prominent position Post-it notes   In order for memory aids to be useful, you need to have good habits.  It's no good remembering to make a note in your journal if you don't remember to look in it.  Try setting aside a certain time of day to look in journal.   4)  Improving mood and managing fatigue. There may be other factors that contribute to memory difficulties.  Factors, such as anxiety, depression and tiredness can affect memory. Regular gentle exercise can help improve your mood and give you more energy. Exercise: there are short videos created by the [employer] on Health specially for older adults: https://bit.ly/2I30q97.  Mediterranean diet: which emphasizes fruits, vegetables, whole grains, legumes, fish, and other seafood; unsaturated fats such as olive oils; and low amounts of red meat, eggs, and sweets. A variation of this, called MIND ([place] Intervention for Neurodegenerative Delay) incorporates the DASH (Dietary Approaches to Stop Hypertension) diet, which has been shown to lower high blood pressure, a risk factor for Alzheimer's disease. More information at: RepublicForum.gl.  Aerobic exercise that improve heart health  is also good for the mind.  [employer] on Aging have short videos for exercises that you can do at home: GoldCloset.com.ee Simple relaxation techniques may help relieve symptoms of anxiety Try to get back to completing activities or hobbies you enjoyed doing in the past. Learn to pace yourself through activities to decrease fatigue. Find out about  some local support groups where you can share experiences with others. Try and achieve 7-8 hours of sleep at night.

## 2022-06-15 ENCOUNTER — Telehealth: Payer: Self-pay | Admitting: Cardiovascular Disease

## 2022-06-15 NOTE — Telephone Encounter (Signed)
Returned call to patient and provided her recent echo results! Patient and husband verbalize understanding.

## 2022-06-15 NOTE — Telephone Encounter (Signed)
Pt states that she is returning a call and believes it may be in regards to results. Requesting return call.

## 2022-06-16 ENCOUNTER — Other Ambulatory Visit: Payer: Self-pay | Admitting: Physical Medicine and Rehabilitation

## 2022-06-16 DIAGNOSIS — M5459 Other low back pain: Secondary | ICD-10-CM

## 2022-06-21 ENCOUNTER — Other Ambulatory Visit: Payer: Self-pay | Admitting: Internal Medicine

## 2022-06-21 ENCOUNTER — Other Ambulatory Visit: Payer: Self-pay | Admitting: Orthopedic Surgery

## 2022-06-21 DIAGNOSIS — M5137 Other intervertebral disc degeneration, lumbosacral region: Secondary | ICD-10-CM

## 2022-06-21 DIAGNOSIS — Z1231 Encounter for screening mammogram for malignant neoplasm of breast: Secondary | ICD-10-CM

## 2022-06-26 IMAGING — MG MM DIGITAL SCREENING BILAT W/ TOMO AND CAD
8 series · 8 of 24 positions shown · non-contrast
Comparison: Previous exam(s).

CLINICAL DATA: Screening.

EXAM:
DIGITAL SCREENING BILATERAL MAMMOGRAM WITH TOMOSYNTHESIS AND CAD
TECHNIQUE: Bilateral screening digital craniocaudal and mediolateral oblique
mammograms were obtained. Bilateral screening digital breast
tomosynthesis was performed. The images were evaluated with
computer-aided detection.

[R MLO synth-2D]
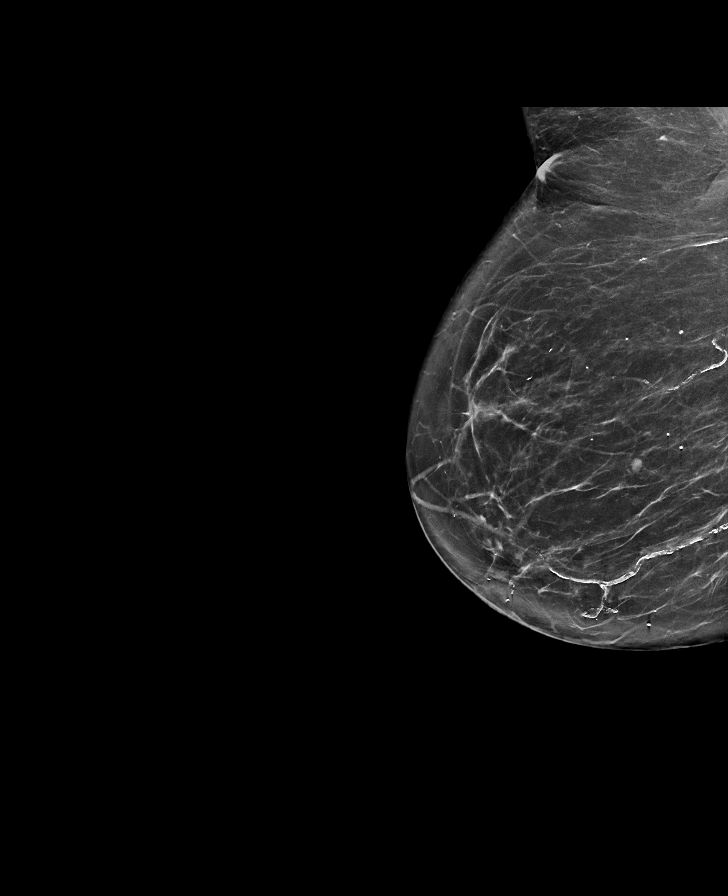

[R CC synth-2D]
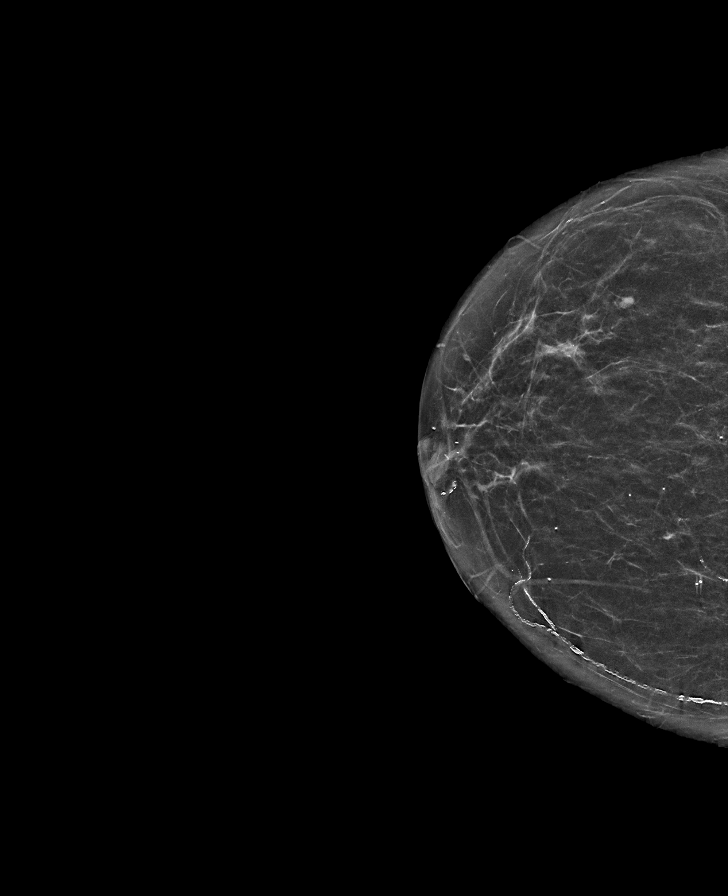

[L CC synth-2D]
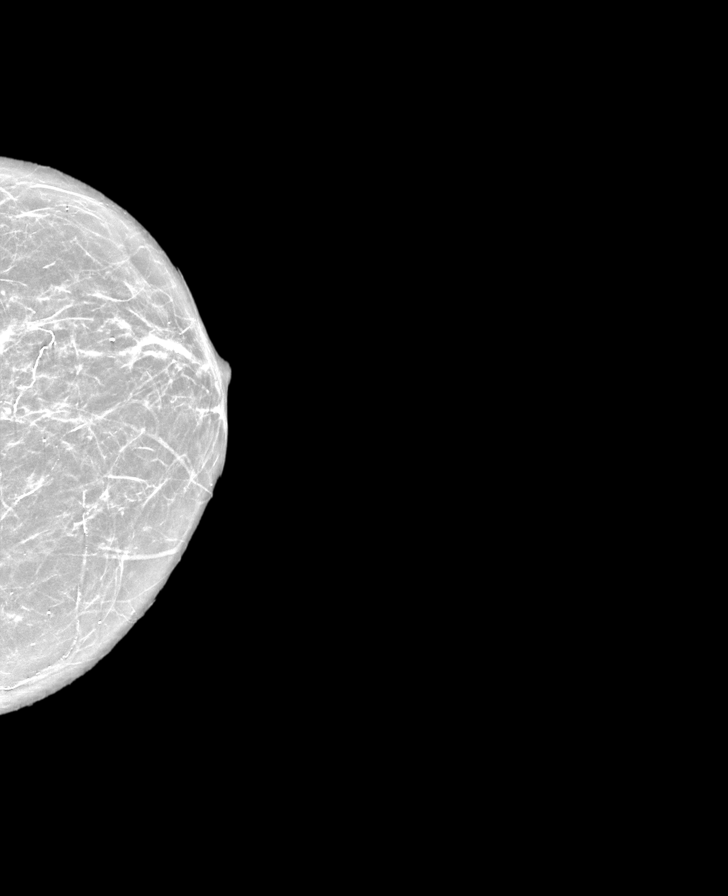

[L MLO synth-2D]
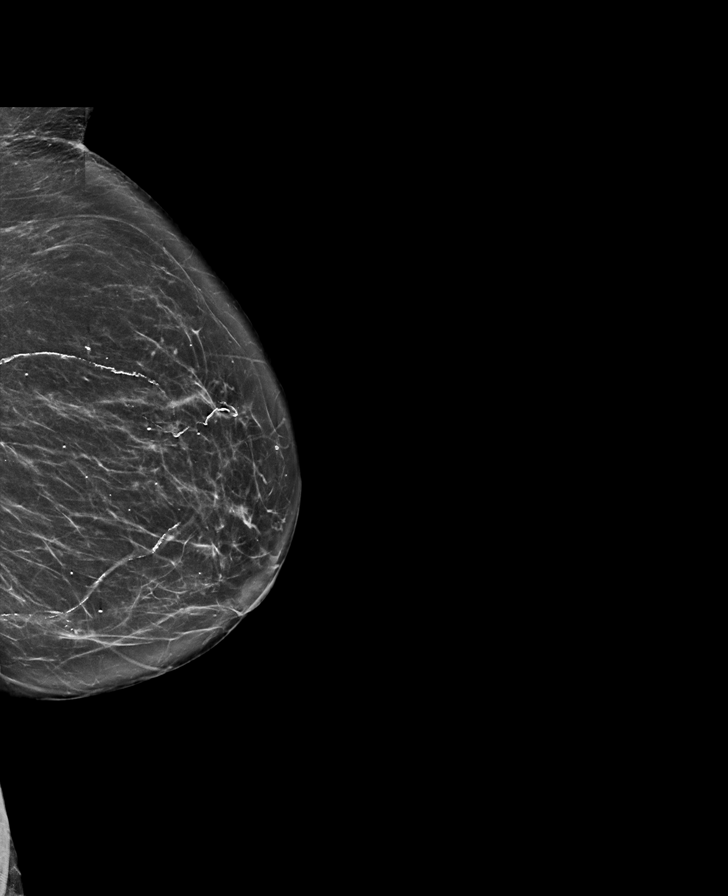

[L CC tomo · tomo slice 7/13.0]
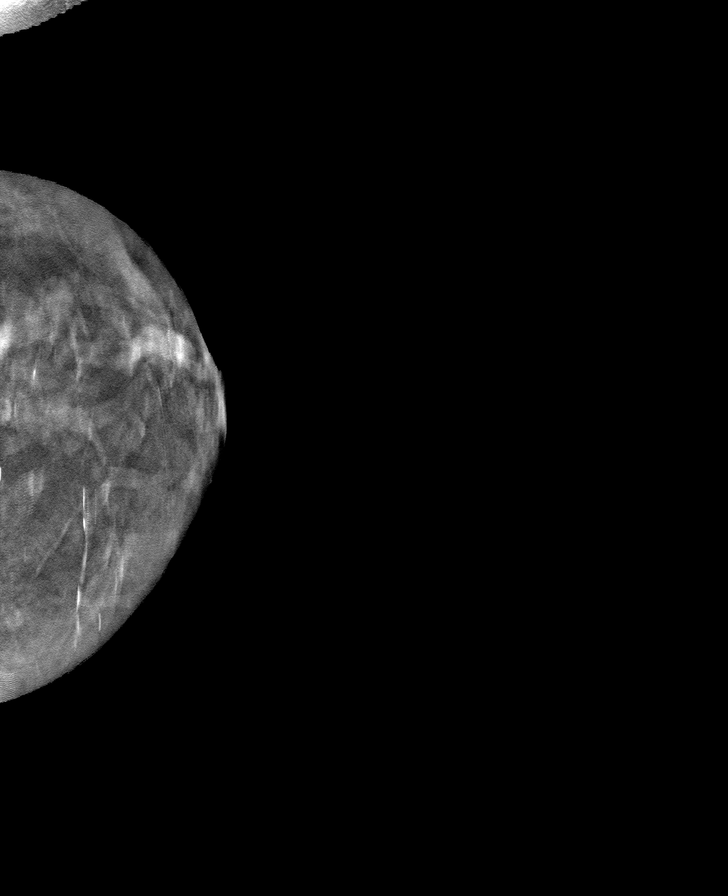

[R CC tomo · tomo slice 29/56.0]
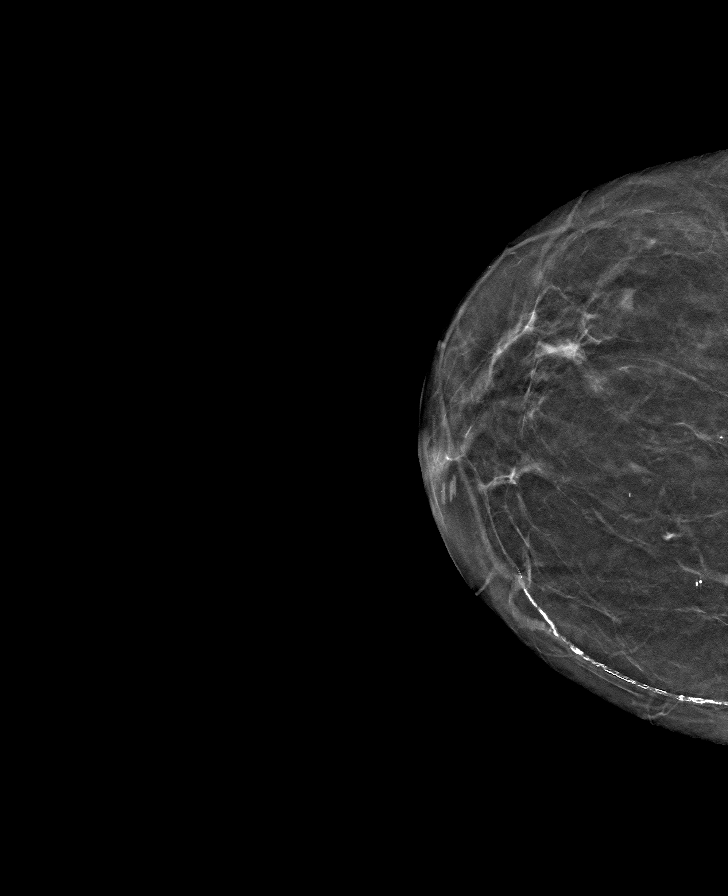

[R MLO tomo · tomo slice 35/70.0]
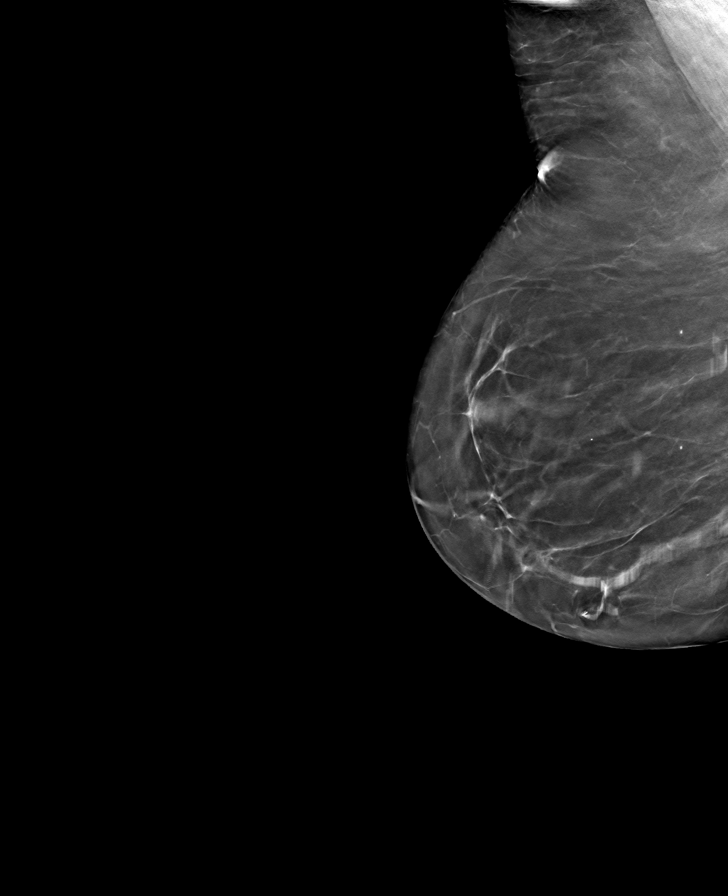

[L MLO tomo · tomo slice 35/69.0]
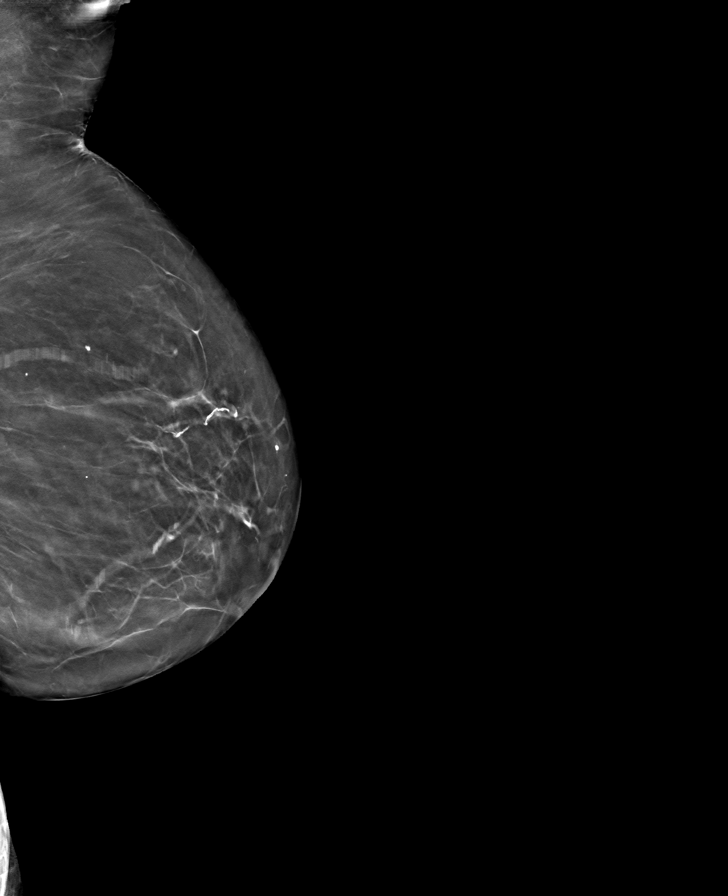

[8 of 24 positions shown; findings below may reference images not displayed]

ACR Breast Density Category b: There are scattered areas of
fibroglandular density.
FINDINGS: There are no findings suspicious for malignancy.
IMPRESSION: No mammographic evidence of malignancy. A result letter of this
screening mammogram will be mailed directly to the patient.

RECOMMENDATION:
Screening mammogram in one year. (Code:51-O-LD2)

BI-RADS CATEGORY  1: Negative.

## 2022-07-10 ENCOUNTER — Telehealth (HOSPITAL_COMMUNITY): Payer: Self-pay

## 2022-07-10 NOTE — Telephone Encounter (Signed)
Called to schedule epidural injection, no answer, left vm. AB

## 2022-07-13 ENCOUNTER — Encounter (HOSPITAL_COMMUNITY): Payer: Self-pay

## 2022-07-13 ENCOUNTER — Inpatient Hospital Stay (HOSPITAL_COMMUNITY): Admission: RE | Admit: 2022-07-13 | Payer: Medicare Other | Source: Ambulatory Visit

## 2022-07-17 ENCOUNTER — Telehealth (HOSPITAL_BASED_OUTPATIENT_CLINIC_OR_DEPARTMENT_OTHER): Payer: Self-pay | Admitting: Family

## 2022-07-17 ENCOUNTER — Ambulatory Visit (HOSPITAL_COMMUNITY)
Admission: RE | Admit: 2022-07-17 | Discharge: 2022-07-17 | Disposition: A | Discharge status: Home / Self Care | Payer: Medicare Other | Source: Ambulatory Visit | Attending: Physical Medicine and Rehabilitation | Admitting: Physical Medicine and Rehabilitation

## 2022-07-17 DIAGNOSIS — M5459 Other low back pain: Secondary | ICD-10-CM | POA: Diagnosis present

## 2022-07-17 NOTE — Telephone Encounter (Signed)
Spoke with patient regarding her appointment date and time---08/06/22 at 2:20 pm--rescheduled from 07/25/22 at 1:30 pm (provider not in the office)---will mail information to patient and she voiced her understanding

## 2022-07-18 ENCOUNTER — Other Ambulatory Visit (HOSPITAL_COMMUNITY): Payer: Self-pay | Admitting: Physician Assistant

## 2022-07-18 ENCOUNTER — Telehealth (HOSPITAL_COMMUNITY): Payer: Self-pay

## 2022-07-18 NOTE — Telephone Encounter (Signed)
-----   Message from Suzette Battiest, MD sent at 07/10/2022 10:47 AM EST ----- Regarding: RE: Epidural injection Happy to do, but yes please get MRI first.  I see some mention of this in the order, maybe Dr. Netta Cedars has already ordered?  As long as its completed prior to the procedure we're good.  Thanks,  Dylan ----- Message ----- From: Chad Cordial Sent: 07/10/2022   8:14 AM EST To: Suzette Battiest, MD Subject: Epidural injection                             Morning Suttle,   I need to schedule this pt for an L4/L5 ESI. The first available is 3/6 and she can't be done at GI bc she requires a lift. Can I put this on for you that day since you are here at [facility]?  I just noticed that she hasn't had an MRI. She would need that done first, correct?  Thanks,  Lia Foyer

## 2022-07-19 ENCOUNTER — Ambulatory Visit (HOSPITAL_COMMUNITY)
Admission: RE | Admit: 2022-07-19 | Discharge: 2022-07-19 | Disposition: A | Discharge status: Home / Self Care | Payer: Medicare Other | Source: Ambulatory Visit | Attending: Orthopedic Surgery | Admitting: Orthopedic Surgery

## 2022-07-19 DIAGNOSIS — M5137 Other intervertebral disc degeneration, lumbosacral region: Secondary | ICD-10-CM

## 2022-07-19 HISTORY — PX: IR INJECT/THERA/INC NEEDLE/CATH/PLC EPI/LUMB/SAC W/IMG: IMG6130

## 2022-07-19 MED ORDER — IOHEXOL 180 MG/ML  SOLN
INTRAMUSCULAR | Status: AC
  Filled 2022-07-19: qty 10

## 2022-07-19 MED ORDER — LIDOCAINE HCL (PF) 1 % IJ SOLN
INTRAMUSCULAR | Status: AC
  Administered 2022-07-19: 8 mL
  Filled 2022-07-19: qty 30

## 2022-07-19 MED ORDER — METHYLPREDNISOLONE ACETATE 40 MG/ML IJ SUSP
INTRAMUSCULAR | Status: AC
  Filled 2022-07-19: qty 1

## 2022-07-19 MED ORDER — STERILE WATER FOR INJECTION IJ SOLN
INTRAMUSCULAR | Status: AC
  Filled 2022-07-19: qty 10

## 2022-07-19 MED ORDER — METHYLPREDNISOLONE ACETATE 80 MG/ML IJ SUSP
INTRAMUSCULAR | Status: AC
  Administered 2022-07-19: 80 mg
  Filled 2022-07-19: qty 1

## 2022-07-19 MED ORDER — IOHEXOL 180 MG/ML  SOLN: 20.0000 mL | Freq: Once | INTRAMUSCULAR | Status: DC | PRN

## 2022-07-25 ENCOUNTER — Ambulatory Visit (HOSPITAL_BASED_OUTPATIENT_CLINIC_OR_DEPARTMENT_OTHER): Payer: Medicare Other | Admitting: Family

## 2022-08-03 ENCOUNTER — Ambulatory Visit (HOSPITAL_BASED_OUTPATIENT_CLINIC_OR_DEPARTMENT_OTHER): Payer: Medicare Other | Admitting: Family

## 2022-08-03 ENCOUNTER — Ambulatory Visit: Payer: Medicare Other

## 2022-08-24 ENCOUNTER — Other Ambulatory Visit (HOSPITAL_COMMUNITY): Payer: Self-pay | Admitting: Orthopedic Surgery

## 2022-08-24 DIAGNOSIS — M51379 Other intervertebral disc degeneration, lumbosacral region without mention of lumbar back pain or lower extremity pain: Secondary | ICD-10-CM

## 2022-08-24 DIAGNOSIS — M5137 Other intervertebral disc degeneration, lumbosacral region: Secondary | ICD-10-CM

## 2022-08-29 ENCOUNTER — Telehealth (HOSPITAL_COMMUNITY): Payer: Self-pay

## 2022-08-29 NOTE — Telephone Encounter (Signed)
Called to give pt epdural injection appt info, no answer, left vm w/instructions. AB

## 2022-08-30 ENCOUNTER — Emergency Department (HOSPITAL_COMMUNITY)
Admission: EM | Admit: 2022-08-30 | Discharge: 2022-08-30 | Disposition: A | Discharge status: Home / Self Care | Payer: Medicare Other | Attending: Emergency Medicine | Admitting: Emergency Medicine

## 2022-08-30 ENCOUNTER — Emergency Department (HOSPITAL_COMMUNITY): Payer: Medicare Other

## 2022-08-30 DIAGNOSIS — Z79899 Other long term (current) drug therapy: Secondary | ICD-10-CM | POA: Diagnosis not present

## 2022-08-30 DIAGNOSIS — M545 Low back pain, unspecified: Secondary | ICD-10-CM | POA: Insufficient documentation

## 2022-08-30 DIAGNOSIS — Z7982 Long term (current) use of aspirin: Secondary | ICD-10-CM | POA: Diagnosis not present

## 2022-08-30 DIAGNOSIS — R413 Other amnesia: Secondary | ICD-10-CM | POA: Diagnosis not present

## 2022-08-30 DIAGNOSIS — I509 Heart failure, unspecified: Secondary | ICD-10-CM | POA: Diagnosis not present

## 2022-08-30 DIAGNOSIS — E119 Type 2 diabetes mellitus without complications: Secondary | ICD-10-CM | POA: Insufficient documentation

## 2022-08-30 DIAGNOSIS — R2243 Localized swelling, mass and lump, lower limb, bilateral: Secondary | ICD-10-CM | POA: Diagnosis not present

## 2022-08-30 DIAGNOSIS — Z7984 Long term (current) use of oral hypoglycemic drugs: Secondary | ICD-10-CM | POA: Diagnosis not present

## 2022-08-30 DIAGNOSIS — E039 Hypothyroidism, unspecified: Secondary | ICD-10-CM | POA: Diagnosis not present

## 2022-08-30 LAB — I-STAT VENOUS BLOOD GAS, ED
Acid-Base Excess: 3 mmol/L — ABNORMAL HIGH (ref 0.0–2.0)
Bicarbonate: 28.9 mmol/L — ABNORMAL HIGH (ref 20.0–28.0)
Calcium, Ion: 1.22 mmol/L (ref 1.15–1.40)
HCT: 39 % (ref 36.0–46.0)
Hemoglobin: 13.3 g/dL (ref 12.0–15.0)
O2 Saturation: 85 %
Potassium: 4.9 mmol/L (ref 3.5–5.1)
Sodium: 134 mmol/L — ABNORMAL LOW (ref 135–145)
TCO2: 30 mmol/L (ref 22–32)
pCO2, Ven: 48 mmHg (ref 44–60)
pH, Ven: 7.388 (ref 7.25–7.43)
pO2, Ven: 51 mmHg — ABNORMAL HIGH (ref 32–45)

## 2022-08-30 LAB — CBC WITH DIFFERENTIAL/PLATELET
Abs Immature Granulocytes: 0.06 10*3/uL (ref 0.00–0.07)
Basophils Absolute: 0.1 10*3/uL (ref 0.0–0.1)
Basophils Relative: 1 %
Eosinophils Absolute: 0.6 10*3/uL — ABNORMAL HIGH (ref 0.0–0.5)
Eosinophils Relative: 4 %
HCT: 38.5 % (ref 36.0–46.0)
Hemoglobin: 13.2 g/dL (ref 12.0–15.0)
Immature Granulocytes: 1 %
Lymphocytes Relative: 27 %
Lymphs Abs: 3.6 10*3/uL (ref 0.7–4.0)
MCH: 32 pg (ref 26.0–34.0)
MCHC: 34.3 g/dL (ref 30.0–36.0)
MCV: 93.4 fL (ref 80.0–100.0)
Monocytes Absolute: 1.1 10*3/uL — ABNORMAL HIGH (ref 0.1–1.0)
Monocytes Relative: 8 %
Neutro Abs: 8 10*3/uL — ABNORMAL HIGH (ref 1.7–7.7)
Neutrophils Relative %: 59 %
Platelets: 427 10*3/uL — ABNORMAL HIGH (ref 150–400)
RBC: 4.12 MIL/uL (ref 3.87–5.11)
RDW: 15 % (ref 11.5–15.5)
WBC: 13.3 10*3/uL — ABNORMAL HIGH (ref 4.0–10.5)
nRBC: 0 % (ref 0.0–0.2)

## 2022-08-30 LAB — COMPREHENSIVE METABOLIC PANEL
ALT: 21 U/L (ref 0–44)
AST: 22 U/L (ref 15–41)
Albumin: 3.2 g/dL — ABNORMAL LOW (ref 3.5–5.0)
Alkaline Phosphatase: 73 U/L (ref 38–126)
Anion gap: 9 (ref 5–15)
BUN: 13 mg/dL (ref 8–23)
CO2: 27 mmol/L (ref 22–32)
Calcium: 9.6 mg/dL (ref 8.9–10.3)
Chloride: 98 mmol/L (ref 98–111)
Creatinine, Ser: 0.5 mg/dL (ref 0.44–1.00)
GFR, Estimated: >60 mL/min (ref >60)
Glucose, Bld: 109 mg/dL — ABNORMAL HIGH (ref 70–99)
Potassium: 4.9 mmol/L (ref 3.5–5.1)
Sodium: 134 mmol/L — ABNORMAL LOW (ref 135–145)
Total Bilirubin: 0.9 mg/dL (ref 0.3–1.2)
Total Protein: 6.8 g/dL (ref 6.5–8.1)

## 2022-08-30 LAB — URINALYSIS, ROUTINE W REFLEX MICROSCOPIC
Bilirubin Urine: NEGATIVE
Glucose, UA: NEGATIVE mg/dL
Hgb urine dipstick: NEGATIVE
Ketones, ur: NEGATIVE mg/dL
Leukocytes,Ua: NEGATIVE
Nitrite: NEGATIVE
Protein, ur: NEGATIVE mg/dL
Specific Gravity, Urine: 1.005 (ref 1.005–1.030)
pH: 5 (ref 5.0–8.0)

## 2022-08-30 LAB — CBG MONITORING, ED: Glucose-Capillary: 113 mg/dL — ABNORMAL HIGH (ref 70–99)

## 2022-08-30 LAB — AMMONIA: Ammonia: 10 umol/L (ref 9–35)

## 2022-08-30 LAB — RAPID URINE DRUG SCREEN, HOSP PERFORMED
Amphetamines: NOT DETECTED
Barbiturates: NOT DETECTED
Benzodiazepines: NOT DETECTED
Cocaine: NOT DETECTED
Opiates: NOT DETECTED
Tetrahydrocannabinol: NOT DETECTED

## 2022-08-30 MED ORDER — ONDANSETRON HCL 4 MG/2ML IJ SOLN
4.0000 mg | Freq: Once | INTRAMUSCULAR | Status: AC
  Administered 2022-08-30: 4 mg via INTRAVENOUS
  Filled 2022-08-30: qty 2

## 2022-08-30 MED ORDER — LIDOCAINE 5 % EX PTCH
1.0000 | MEDICATED_PATCH | CUTANEOUS | Status: DC
  Administered 2022-08-30: 1 via TRANSDERMAL
  Filled 2022-08-30: qty 1

## 2022-08-30 MED ORDER — MORPHINE SULFATE (PF) 4 MG/ML IV SOLN
4.0000 mg | Freq: Once | INTRAVENOUS | Status: AC
  Administered 2022-08-30: 4 mg via INTRAVENOUS
  Filled 2022-08-30: qty 1

## 2022-08-30 MED ORDER — FENTANYL CITRATE PF 50 MCG/ML IJ SOSY
50.0000 ug | PREFILLED_SYRINGE | Freq: Once | INTRAMUSCULAR | Status: AC
  Administered 2022-08-30: 50 ug via INTRAVENOUS
  Filled 2022-08-30: qty 1

## 2022-08-30 NOTE — ED Triage Notes (Signed)
Patient BIB [facility] from home for evaluation of lower back pain that started three days ago. Patient is bed bound, denies recent injury. Caregiver at bedside also reports patient has had some difficulty with memory over the last few weeks, mis-remembering events and being forgetful. Patient is currently alert and in no apparent distress at this time.

## 2022-08-30 NOTE — Discharge Instructions (Addendum)
You are seen today in the emergency department due to back pain.  The CT scan was reassuring, also no indication of infection.  Your laboratory workup is very reassuring as well.  Will continue to follow-up with your primary doctor regarding pain medicine, take Tylenol and the tenacity and at home.  Regarding the change in mental status, continue to follow-up with [facility] neurology.  Also please call call [facility] for scheduling a follow-up appointment regarding memory changes, this was supposed to be scheduled in January for additional testing and that is the neck step working at the memory changes.  Return to the ED for fevers, lateralized weakness numbness,, new or concerning worsening symptoms  Below is our plan:   We will continue to monitor. Please call [facility] to schedule neurocognitive evaluation.  [phone]. We will follow up pending their review.    Ask Dr Thornell Mule about gabapentin and amitriptyline. Do you still need these?    Please make sure you are staying well hydrated. I recommend 50-60 ounces daily. Well balanced diet and regular exercise encouraged. Consistent sleep schedule with 6-8 hours recommended.    Please continue follow up with care team as directed.    Follow up with me pending neurocognitive evaluation.    You may receive a survey regarding today's visit. I encourage you to leave honest feed back as I do use this information to improve patient care. Thank you for seeing me today!    Management of Memory Problems   There are some general things you can do to help manage your memory problems.  Your memory may not in fact recover, but by using techniques and strategies you will be able to manage your memory difficulties better.   1)  Establish a routine. Try to establish and then stick to a regular routine.  By doing this, you will get used to what to expect and you will reduce the need to rely on your memory.  Also, try to do things at the same time  of day, such as taking your medication or checking your calendar first thing in the morning. Think about think that you can do as a part of a regular routine and make a list.  Then enter them into a daily planner to remind you.  This will help you establish a routine.   2)  Organize your environment. Organize your environment so that it is uncluttered.  Decrease visual stimulation.  Place everyday items such as keys or cell phone in the same place every day (ie.  Basket next to front door) Use post it notes with a brief message to yourself (ie. Turn off light, lock the door) Use labels to indicate where things go (ie. Which cupboards are for food, dishes, etc.) Keep a notepad and pen by the telephone to take messages   3)  Memory Aids A diary or journal/notebook/daily planner Making a list (shopping list, chore list, to do list that needs to be done) Using an alarm as a reminder (kitchen timer or cell phone alarm) Using cell phone to store information (Notes, Calendar, Reminders) Calendar/White board placed in a prominent position Post-it notes   In order for memory aids to be useful, you need to have good habits.  It's no good remembering to make a note in your journal if you don't remember to look in it.  Try setting aside a certain time of day to look in journal.   4)  Improving mood and managing fatigue. There may  be other factors that contribute to memory difficulties.  Factors, such as anxiety, depression and tiredness can affect memory. Regular gentle exercise can help improve your mood and give you more energy. Exercise: there are short videos created by the [employer] on Health specially for older adults: https://bit.ly/2I30q97.  Mediterranean diet: which emphasizes fruits, vegetables, whole grains, legumes, fish, and other seafood; unsaturated fats such as olive oils; and low amounts of red meat, eggs, and sweets. A variation of this, called MIND (Mediterranean-DASH  Intervention for Neurodegenerative Delay) incorporates the DASH (Dietary Approaches to Stop Hypertension) diet, which has been shown to lower high blood pressure, a risk factor for Alzheimer's disease. More information at: ExitMarketing.de.  Aerobic exercise that improve heart health is also good for the mind.  [employer] on Aging have short videos for exercises that you can do at home: BlindWorkshop.com.pt Simple relaxation techniques may help relieve symptoms of anxiety Try to get back to completing activities or hobbies you enjoyed doing in the past. Learn to pace yourself through activities to decrease fatigue. Find out about some local support groups where you can share experiences with others. Try and achieve 7-8 hours of sleep at night.

## 2022-08-30 NOTE — ED Provider Notes (Signed)
[place] EMERGENCY DEPARTMENT AT [facility] Provider Note   CSN: [id number] Arrival date & time: 08/30/22  1431     History  Chief Complaint  Patient presents with   Back Pain    [name] is a 77 y.o. female.   Back Pain    Patient with medical history of hyperlipidemia, CHF, chronic lower back pain, hypothyroid, recent memory changes, Takotsubo cardiomyopathy, postpolio syndrome, diabetes, wheelchair dependent/bedbound at bedside presents to the emergency department due to low back pain.  Patient has a history of this, it is lower with radiation into her "tailbone".  It started 3 days ago atraumatically, denies any falls or injuries.  It is constant, worse with movement.  At baseline she uses pure wick, unable to utilize lower extremity secondary to polio complications.  She has not had any dysuria, hematuria, abdominal pain, nausea, vomiting, chest pain, shortness of breath, headaches, lateralized weakness or numbness.  Her nausea and was changed to once at night as opposed to 3 times daily but other than that no recent changes in her medication. Family is concerned about mental status changes, over the last 2 to 3 months patient has been acting differently.    Specifically she has been confused not remembering taking her pills or not.  No previous spinal surgeries, no saddle anesthesia.  She has urinary incontinence at baseline, she does have sensation when she urinates.  Home Medications Prior to Admission medications   Medication Sig Start Date End Date Taking? Authorizing Provider  amitriptyline (ELAVIL) 25 MG tablet 25 mg at bedtime.   Yes [provider]  ascorbic acid (VITAMIN C) 500 MG tablet Take 500 mg by mouth daily.   Yes [provider]  aspirin 81 MG tablet Take 81 mg by mouth at bedtime.    Yes [provider]  atorvastatin (LIPITOR) 40 MG tablet Take 40 mg by mouth daily.   Yes [provider]  b complex  vitamins tablet Take 1 tablet by mouth daily.   Yes [provider]  carvedilol (COREG) 25 MG tablet carvedilol 25 mg tablet   Yes [provider]  clonazePAM (KLONOPIN) 0.5 MG tablet Take 0.5 mg by mouth 2 (two) times daily.   Yes [provider]  gabapentin (NEURONTIN) 300 MG capsule Take 300 mg by mouth at bedtime.   Yes [provider]  Iron, Ferrous Sulfate, 325 (65 Fe) MG TABS Take 1 tablet by mouth daily.   Yes [provider]  levothyroxine (SYNTHROID, LEVOTHROID) 100 MCG tablet Take 100 mcg by mouth daily before breakfast.   Yes [provider]  loratadine (CLARITIN) 10 MG tablet Take 10 mg by mouth daily. 12/17/19  Yes [provider]  metFORMIN (GLUCOPHAGE-XR) 500 MG 24 hr tablet Take 1 tablet by mouth in the morning and at bedtime.  12/24/18  Yes [provider]  mirabegron ER (MYRBETRIQ) 50 MG TB24 tablet Take 50 mg by mouth every other day.    Yes [provider]  Multiple Vitamins-Minerals (MULTIVITAMIN WITH MINERALS) tablet Take 1 tablet by mouth daily.   Yes [provider]  pantoprazole (PROTONIX) 40 MG tablet Take 1 tablet by mouth daily. Take 1 tab daily 12/21/14  Yes [provider]  telmisartan (MICARDIS) 80 MG tablet Take 1 tablet by mouth daily. Take 1 tab daily 12/14/14  Yes [provider]  tiZANidine (ZANAFLEX) 4 MG tablet Take 4 mg by mouth 5 (five) times daily as needed for muscle spasms.  PAIN   Yes [provider]  Vitamin D, Ergocalciferol, (DRISDOL) 1.25 MG (50000 UNIT) CAPS capsule Take 50,000 Units by mouth every 7 (seven) days.   Yes [provider]  vitamin E 180 MG (400 UNITS) capsule Take 400 Units by mouth daily.   Yes [provider]  Zinc 50 MG TABS Take 50 mg by mouth daily.   Yes [provider]  zolpidem (AMBIEN) 5 MG tablet Take 5 mg by mouth at bedtime as needed for sleep.   Yes [provider]  Calcium  Carbonate-Vitamin D 600-400 MG-UNIT chew tablet Chew 1 tablet by mouth 2 (two) times daily.  Patient not taking: Reported on 08/30/2022    [provider]  nitroGLYCERIN (NITROSTAT) 0.4 MG SL tablet Place 1 tablet (0.4 mg total) under the tongue every 5 (five) minutes as needed for chest pain. 08/02/21   Alver Sorrow, NP  spironolactone (ALDACTONE) 25 MG tablet TAKE 1/2 TABLET DAILY 05/26/22   Chilton Si, MD  torsemide (DEMADEX) 5 MG tablet Take 5 mg by mouth daily.    [provider]  traMADol (ULTRAM) 50 MG tablet Take 1 tablet (50 mg total) by mouth every 6 (six) hours as needed. Patient not taking: Reported on 08/30/2022 06/04/22   Lorre Nick, MD      Allergies    Tamsulosin, Ciprofloxacin, Carisoprodol-aspirin, Penicillins, Sulfa antibiotics, Acetaminophen, Macrodantin [nitrofurantoin macrocrystal], Nsaids, Percocet [oxycodone-acetaminophen], Propoxyphene, Propoxyphene n-acetaminophen, and Soma compound [carisoprodol-aspirin]    Review of Systems   Review of Systems  Musculoskeletal:  Positive for back pain.    Physical Exam Updated Vital Signs BP (!) 162/61   Pulse (!) 56   Temp 98 F (36.7 C) (Oral)   Resp 16   SpO2 97%  Physical Exam Vitals and nursing note reviewed. Exam conducted with a chaperone present.  Constitutional:      Appearance: Normal appearance. She is obese.  HENT:     Head: Normocephalic and atraumatic.  Eyes:     General: No scleral icterus.       Right eye: No discharge.        Left eye: No discharge.     Extraocular Movements: Extraocular movements intact.     Pupils: Pupils are equal, round, and reactive to light.  Cardiovascular:     Rate and Rhythm: Normal rate and regular rhythm.     Pulses: Normal pulses.     Heart sounds: Normal heart sounds.     No friction rub. No gallop.  Pulmonary:     Effort: Pulmonary effort is normal. No respiratory distress.     Breath sounds: Normal breath sounds.  Abdominal:      General: Abdomen is flat. Bowel sounds are normal. There is no distension.     Palpations: Abdomen is soft.     Tenderness: There is no abdominal tenderness.  Musculoskeletal:        General: Tenderness present.     Cervical back: Normal range of motion.     Right lower leg: Edema present.     Left lower leg: Edema present.     Comments: Midline tenderness back.  Skin:    General: Skin is warm and dry.     Coloration: Skin is not jaundiced.     Comments: Patient turned over, no decubitus ulcer or lower abdominal abscess.  Neurological:     Mental Status: She is alert. Mental status is at baseline.     Coordination: Coordination normal.  Comments: Oriented x 3, pleasant, answers questions and confused with me.     ED Results / Procedures / Treatments   Labs (all labs ordered are listed, but only abnormal results are displayed) Labs Reviewed  COMPREHENSIVE METABOLIC PANEL - Abnormal; Notable for the following components:      Result Value   Sodium 134 (*)    Glucose, Bld 109 (*)    Albumin 3.2 (*)    All other components within normal limits  CBC WITH DIFFERENTIAL/PLATELET - Abnormal; Notable for the following components:   WBC 13.3 (*)    Platelets 427 (*)    Neutro Abs 8.0 (*)    Monocytes Absolute 1.1 (*)    Eosinophils Absolute 0.6 (*)    All other components within normal limits  CBG MONITORING, ED - Abnormal; Notable for the following components:   Glucose-Capillary 113 (*)    All other components within normal limits  I-STAT VENOUS BLOOD GAS, ED - Abnormal; Notable for the following components:   pO2, Ven 51 (*)    Bicarbonate 28.9 (*)    Acid-Base Excess 3.0 (*)    Sodium 134 (*)    All other components within normal limits  URINALYSIS, ROUTINE W REFLEX MICROSCOPIC  RAPID URINE DRUG SCREEN, HOSP PERFORMED  AMMONIA  BLOOD GAS, VENOUS    EKG EKG Interpretation  Date/Time:  Wednesday August 30 2022 15:31:40 EDT Ventricular Rate:  53 PR Interval:  153 QRS  Duration: 95 QT Interval:  436 QTC Calculation: 410 R Axis:   44 Text Interpretation: Sinus rhythm Atrial premature complexes Low voltage, precordial leads \ No significant change since last tracing Confirmed by Elayne Snare ([id number]) on 08/30/2022 8:01:24 PM  Radiology CT Lumbar Spine Wo Contrast  Result Date: 08/30/2022 CLINICAL DATA:  Lumbar plexopathy, traumatic EXAM: CT LUMBAR SPINE WITHOUT CONTRAST TECHNIQUE: Multidetector CT imaging of the lumbar spine was performed without intravenous contrast administration. Multiplanar CT image reconstructions were also generated. RADIATION DOSE REDUCTION: This exam was performed according to the departmental dose-optimization program which includes automated exposure control, adjustment of the mA and/or kV according to patient size and/or use of iterative reconstruction technique. COMPARISON:  MRI lumbar spine July 17, 2022. FINDINGS: Segmentation: Standard. Alignment: Levoconvex lumbar curvature. Vertebrae: No evidence of acute fracture. Mild height loss of multiple levels appears similar to the prior MRI. Osteopenia. Paraspinal and other soft tissues: aorta bi-iliac calcific atherosclerosis. Cholecystectomy. Disc levels: Multilevel degenerative change, including severe left subarticular and foraminal stenosis at L4-L5 and multilevel at least moderate subarticular recess and foraminal stenosis. IMPRESSION: 1. No evidence of acute fracture or traumatic malalignment. 2. Multilevel degenerative change, including severe left subarticular and foraminal stenosis at L4-L5 and multilevel at least moderate subarticular recess and foraminal stenosis. An MRI could better assess the canal and foramina if clinically warranted. 3. Levoconvex lumbar curvature. 4. Osteopenia. 5. Aortic Atherosclerosis (ICD10-I70.0). Electronically Signed   By: Feliberto Harts M.D.   On: 08/30/2022 18:15   CT Head Wo Contrast  Result Date: 08/30/2022 CLINICAL DATA:  Mental status change,  unknown cause EXAM: CT HEAD WITHOUT CONTRAST TECHNIQUE: Contiguous axial images were obtained from the base of the skull through the vertex without intravenous contrast. RADIATION DOSE REDUCTION: This exam was performed according to the departmental dose-optimization program which includes automated exposure control, adjustment of the mA and/or kV according to patient size and/or use of iterative reconstruction technique. COMPARISON:  06/30/2013 FINDINGS: Brain: Mild age related volume loss. No acute intracranial abnormality. Specifically, no hemorrhage, hydrocephalus,  mass lesion, acute infarction, or significant intracranial injury. Vascular: No hyperdense vessel or unexpected calcification. Skull: No acute calvarial abnormality. Sinuses/Orbits: No acute findings Other: None IMPRESSION: No acute intracranial abnormality. Electronically Signed   By: Charlett Nose M.D.   On: 08/30/2022 15:54   DG Chest Portable 1 View  Result Date: 08/30/2022 CLINICAL DATA:  weakness EXAM: PORTABLE CHEST - 1 VIEW COMPARISON:  09/24/2017 FINDINGS: Cardiac silhouette is unremarkable. No pneumothorax or pleural effusion. The lungs are clear. Aorta is calcified. The visualized skeletal structures are unremarkable. IMPRESSION: No acute cardiopulmonary process. Electronically Signed   By: Layla Maw M.D.   On: 08/30/2022 15:35    Procedures Procedures    Medications Ordered in ED Medications  lidocaine (LIDODERM) 5 % 1 patch (1 patch Transdermal Patch Applied 08/30/22 1817)  morphine (PF) 4 MG/ML injection 4 mg (4 mg Intravenous Given 08/30/22 1841)  ondansetron (ZOFRAN) injection 4 mg (4 mg Intravenous Given 08/30/22 1840)  fentaNYL (SUBLIMAZE) injection 50 mcg (50 mcg Intravenous Given 08/30/22 2048)    ED Course/ Medical Decision Making/ A&P                             Medical Decision Making Amount and/or Complexity of Data Reviewed Labs: ordered. Radiology: ordered.  Risk Prescription drug  management.   Patient presents to the emergency department due to low back pain and changes in memory.  Differential is broad and includes skull fracture, dislocation, myalgia, acute on chronic low back pain, cauda equina, epidural abscess, cord compression, malignancy, UTI.  She has a long history of low back pain and no recent injuries I do suspect it is more likely not adequately controlled pain at home given decreased dosing of her home medications There is no skin ulceration or decubitus ulcer, no erythema or abscess to the lower back on visualization.    Regarding the mental status change differential includes electrolyte derangement, dehydration, infection, hypoglycemia, medication reaction, encephalopathy.  Given this is not going on for multiple months gradually and patient is completely oriented and not confused on my evaluation I suspect this may be due to dementia versus medications, does not sound like an acute process though will check labs.  Patient's home health nurses independent story.  I also reviewed external medical records including previous imaging studies.  Patient was recently seen by neurology for memory loss in January of this year.  This appears to be gradually progressing issue rather than sudden onset altered and patient is not altered on my exam. - Patient had MRI lumbar spine 07/17/2022 slightly over a month ago which shows DDD and stenosis.  Laboratory workup is reassuring.  No acidosis, CMP is without gross electrolyte derangement, AKI or transaminitis.  Slightly hypoalbumin 3.2.  CBC with a slight leukocytosis, historically she tends to have leukocytosis as well.  Patient is not hypoglycemic, UDS is negative, UA is negative for UTI.  Clinically, no SIRS criteria met and I do not think patient is septic.  There is no obvious source of infection on imaging studies, no pneumonia, CT's negative for acute intracranial process or acute fractures. Given MRI month ago was negative  she has no infectious symptoms low suspicion for epidural abscess. No red flag symptoms for cauda equina.   Discussed the workup with the patient and husband who is now bedside.  We discussed previous recommendations from neurology, they have not followed up with neuropsychologist so I provided them with contact informations.  Strict return precautions were discussed.          Final Clinical Impression(s) / ED Diagnoses Final diagnoses:  Bilateral low back pain without sciatica, unspecified chronicity  Memory loss    Rx / DC Orders ED Discharge Orders     None         Theron Arista, PA-C 08/30/22 2239    Elayne Snare K, DO 08/30/22 2254

## 2022-08-31 ENCOUNTER — Ambulatory Visit (HOSPITAL_COMMUNITY): Payer: Medicare Other

## 2022-08-31 ENCOUNTER — Encounter (HOSPITAL_COMMUNITY): Payer: Self-pay

## 2022-09-11 ENCOUNTER — Ambulatory Visit (HOSPITAL_COMMUNITY)
Admission: RE | Admit: 2022-09-11 | Discharge: 2022-09-11 | Disposition: A | Discharge status: Home / Self Care | Payer: Medicare Other | Source: Ambulatory Visit | Attending: Orthopedic Surgery | Admitting: Orthopedic Surgery

## 2022-09-11 DIAGNOSIS — M5137 Other intervertebral disc degeneration, lumbosacral region: Secondary | ICD-10-CM | POA: Insufficient documentation

## 2022-09-11 HISTORY — PX: IR INJECT/THERA/INC NEEDLE/CATH/PLC EPI/LUMB/SAC W/IMG: IMG6130

## 2022-09-11 MED ORDER — IOHEXOL 180 MG/ML  SOLN
INTRAMUSCULAR | Status: AC
  Filled 2022-09-11: qty 10

## 2022-09-11 MED ORDER — LIDOCAINE HCL (PF) 1 % IJ SOLN
30.0000 mL | Freq: Once | INTRAMUSCULAR | Status: AC
  Administered 2022-09-11: 10 mL via INTRADERMAL

## 2022-09-11 MED ORDER — METHYLPREDNISOLONE ACETATE 80 MG/ML IJ SUSP
80.0000 mg | Freq: Once | INTRAMUSCULAR | Status: AC
  Administered 2022-09-11: 80 mg via INTRAMUSCULAR

## 2022-09-11 MED ORDER — IOHEXOL 180 MG/ML  SOLN
20.0000 mL | Freq: Once | INTRAMUSCULAR | Status: AC | PRN
  Administered 2022-09-11: 3 mL via INTRAVENOUS

## 2022-09-11 MED ORDER — METHYLPREDNISOLONE ACETATE 80 MG/ML IJ SUSP
INTRAMUSCULAR | Status: AC
  Filled 2022-09-11: qty 1

## 2022-09-11 MED ORDER — LIDOCAINE HCL (PF) 1 % IJ SOLN
INTRAMUSCULAR | Status: AC
  Filled 2022-09-11: qty 30

## 2022-09-11 NOTE — Procedures (Signed)
Interventional Radiology Procedure Note  Hx: Patient report "lower back pain", across entire low back.  Non-ambulatory.  No referred pain. Reports improved after last ESI.   Procedure:  Lumbar ESI.  L3-L4 level, left side. 4cc 1% lidocaine + 80mg  kenalog.   Complications: None  Recommendations:  - Ok to shower tomorrow - Do not submerge for 7 days - Routine care   Signed,  Yvone Neu. Loreta Ave, DO

## 2022-09-18 ENCOUNTER — Ambulatory Visit
Admission: RE | Admit: 2022-09-18 | Discharge: 2022-09-18 | Disposition: A | Discharge status: Home / Self Care | Payer: Medicare Other | Source: Ambulatory Visit | Attending: Internal Medicine | Admitting: Internal Medicine

## 2022-09-18 DIAGNOSIS — Z1231 Encounter for screening mammogram for malignant neoplasm of breast: Secondary | ICD-10-CM

## 2022-10-25 ENCOUNTER — Inpatient Hospital Stay (HOSPITAL_COMMUNITY)
Admission: EM | Admit: 2022-10-25 | Discharge: 2022-11-01 | DRG: 871 | Disposition: A | Discharge status: Home Health | Payer: Medicare Other | Attending: Family Medicine | Admitting: Family Medicine

## 2022-10-25 ENCOUNTER — Other Ambulatory Visit: Payer: Self-pay

## 2022-10-25 ENCOUNTER — Emergency Department (HOSPITAL_COMMUNITY): Payer: Medicare Other

## 2022-10-25 ENCOUNTER — Encounter (HOSPITAL_COMMUNITY): Payer: Self-pay | Admitting: Internal Medicine

## 2022-10-25 DIAGNOSIS — M159 Polyosteoarthritis, unspecified: Secondary | ICD-10-CM | POA: Diagnosis present

## 2022-10-25 DIAGNOSIS — K219 Gastro-esophageal reflux disease without esophagitis: Secondary | ICD-10-CM | POA: Diagnosis present

## 2022-10-25 DIAGNOSIS — Z79899 Other long term (current) drug therapy: Secondary | ICD-10-CM

## 2022-10-25 DIAGNOSIS — I959 Hypotension, unspecified: Secondary | ICD-10-CM | POA: Diagnosis present

## 2022-10-25 DIAGNOSIS — E860 Dehydration: Secondary | ICD-10-CM | POA: Diagnosis present

## 2022-10-25 DIAGNOSIS — E1165 Type 2 diabetes mellitus with hyperglycemia: Secondary | ICD-10-CM | POA: Diagnosis present

## 2022-10-25 DIAGNOSIS — I10 Essential (primary) hypertension: Secondary | ICD-10-CM | POA: Diagnosis present

## 2022-10-25 DIAGNOSIS — Z833 Family history of diabetes mellitus: Secondary | ICD-10-CM

## 2022-10-25 DIAGNOSIS — Z8249 Family history of ischemic heart disease and other diseases of the circulatory system: Secondary | ICD-10-CM

## 2022-10-25 DIAGNOSIS — N10 Acute tubulo-interstitial nephritis: Secondary | ICD-10-CM | POA: Diagnosis present

## 2022-10-25 DIAGNOSIS — A4151 Sepsis due to Escherichia coli [E. coli]: Principal | ICD-10-CM | POA: Diagnosis present

## 2022-10-25 DIAGNOSIS — N3000 Acute cystitis without hematuria: Secondary | ICD-10-CM | POA: Diagnosis not present

## 2022-10-25 DIAGNOSIS — R001 Bradycardia, unspecified: Secondary | ICD-10-CM | POA: Diagnosis present

## 2022-10-25 DIAGNOSIS — Z993 Dependence on wheelchair: Secondary | ICD-10-CM | POA: Diagnosis not present

## 2022-10-25 DIAGNOSIS — G8929 Other chronic pain: Secondary | ICD-10-CM | POA: Diagnosis present

## 2022-10-25 DIAGNOSIS — R571 Hypovolemic shock: Secondary | ICD-10-CM | POA: Diagnosis present

## 2022-10-25 DIAGNOSIS — E669 Obesity, unspecified: Secondary | ICD-10-CM | POA: Diagnosis present

## 2022-10-25 DIAGNOSIS — Z6833 Body mass index (BMI) 33.0-33.9, adult: Secondary | ICD-10-CM

## 2022-10-25 DIAGNOSIS — E035 Myxedema coma: Secondary | ICD-10-CM | POA: Diagnosis present

## 2022-10-25 DIAGNOSIS — Z886 Allergy status to analgesic agent status: Secondary | ICD-10-CM

## 2022-10-25 DIAGNOSIS — E039 Hypothyroidism, unspecified: Secondary | ICD-10-CM | POA: Diagnosis present

## 2022-10-25 DIAGNOSIS — E876 Hypokalemia: Secondary | ICD-10-CM | POA: Diagnosis present

## 2022-10-25 DIAGNOSIS — E7800 Pure hypercholesterolemia, unspecified: Secondary | ICD-10-CM | POA: Diagnosis present

## 2022-10-25 DIAGNOSIS — N390 Urinary tract infection, site not specified: Secondary | ICD-10-CM | POA: Diagnosis present

## 2022-10-25 DIAGNOSIS — G14 Postpolio syndrome: Secondary | ICD-10-CM

## 2022-10-25 DIAGNOSIS — R6521 Severe sepsis with septic shock: Secondary | ICD-10-CM

## 2022-10-25 DIAGNOSIS — Z90710 Acquired absence of both cervix and uterus: Secondary | ICD-10-CM

## 2022-10-25 DIAGNOSIS — Z881 Allergy status to other antibiotic agents status: Secondary | ICD-10-CM

## 2022-10-25 DIAGNOSIS — S81802A Unspecified open wound, left lower leg, initial encounter: Secondary | ICD-10-CM | POA: Diagnosis present

## 2022-10-25 DIAGNOSIS — E782 Mixed hyperlipidemia: Secondary | ICD-10-CM

## 2022-10-25 DIAGNOSIS — L03116 Cellulitis of left lower limb: Secondary | ICD-10-CM | POA: Diagnosis present

## 2022-10-25 DIAGNOSIS — E119 Type 2 diabetes mellitus without complications: Secondary | ICD-10-CM

## 2022-10-25 DIAGNOSIS — F05 Delirium due to known physiological condition: Secondary | ICD-10-CM | POA: Diagnosis not present

## 2022-10-25 DIAGNOSIS — A419 Sepsis, unspecified organism: Secondary | ICD-10-CM

## 2022-10-25 DIAGNOSIS — M545 Low back pain, unspecified: Secondary | ICD-10-CM | POA: Diagnosis present

## 2022-10-25 DIAGNOSIS — Z7984 Long term (current) use of oral hypoglycemic drugs: Secondary | ICD-10-CM

## 2022-10-25 DIAGNOSIS — M7989 Other specified soft tissue disorders: Secondary | ICD-10-CM | POA: Diagnosis not present

## 2022-10-25 DIAGNOSIS — G9340 Encephalopathy, unspecified: Secondary | ICD-10-CM | POA: Diagnosis not present

## 2022-10-25 DIAGNOSIS — E871 Hypo-osmolality and hyponatremia: Secondary | ICD-10-CM | POA: Diagnosis not present

## 2022-10-25 DIAGNOSIS — M25561 Pain in right knee: Secondary | ICD-10-CM | POA: Diagnosis not present

## 2022-10-25 DIAGNOSIS — Z8612 Personal history of poliomyelitis: Secondary | ICD-10-CM | POA: Diagnosis not present

## 2022-10-25 DIAGNOSIS — Z79890 Hormone replacement therapy (postmenopausal): Secondary | ICD-10-CM

## 2022-10-25 DIAGNOSIS — Z6841 Body Mass Index (BMI) 40.0 and over, adult: Secondary | ICD-10-CM

## 2022-10-25 DIAGNOSIS — Z7982 Long term (current) use of aspirin: Secondary | ICD-10-CM

## 2022-10-25 DIAGNOSIS — G9341 Metabolic encephalopathy: Secondary | ICD-10-CM | POA: Diagnosis present

## 2022-10-25 DIAGNOSIS — Z880 Allergy status to penicillin: Secondary | ICD-10-CM

## 2022-10-25 DIAGNOSIS — Z882 Allergy status to sulfonamides status: Secondary | ICD-10-CM

## 2022-10-25 DIAGNOSIS — Z885 Allergy status to narcotic agent status: Secondary | ICD-10-CM

## 2022-10-25 DIAGNOSIS — E8809 Other disorders of plasma-protein metabolism, not elsewhere classified: Secondary | ICD-10-CM | POA: Diagnosis present

## 2022-10-25 DIAGNOSIS — E785 Hyperlipidemia, unspecified: Secondary | ICD-10-CM | POA: Diagnosis present

## 2022-10-25 DIAGNOSIS — Z87440 Personal history of urinary (tract) infections: Secondary | ICD-10-CM

## 2022-10-25 DIAGNOSIS — F419 Anxiety disorder, unspecified: Secondary | ICD-10-CM | POA: Diagnosis present

## 2022-10-25 DIAGNOSIS — Z888 Allergy status to other drugs, medicaments and biological substances status: Secondary | ICD-10-CM

## 2022-10-25 DIAGNOSIS — Z9049 Acquired absence of other specified parts of digestive tract: Secondary | ICD-10-CM

## 2022-10-25 DIAGNOSIS — Z7401 Bed confinement status: Secondary | ICD-10-CM

## 2022-10-25 LAB — URINALYSIS, ROUTINE W REFLEX MICROSCOPIC
Bilirubin Urine: NEGATIVE
Glucose, UA: NEGATIVE mg/dL
Ketones, ur: NEGATIVE mg/dL
Nitrite: POSITIVE — AB
Protein, ur: NEGATIVE mg/dL
Specific Gravity, Urine: 1.006 (ref 1.005–1.030)
WBC, UA: >50 WBC/hpf (ref 0–5)
pH: 5 (ref 5.0–8.0)

## 2022-10-25 LAB — COMPREHENSIVE METABOLIC PANEL
ALT: 15 U/L (ref 0–44)
AST: 19 U/L (ref 15–41)
Albumin: 2.6 g/dL — ABNORMAL LOW (ref 3.5–5.0)
Alkaline Phosphatase: 60 U/L (ref 38–126)
Anion gap: 11 (ref 5–15)
BUN: 8 mg/dL (ref 8–23)
CO2: 22 mmol/L (ref 22–32)
Calcium: 8.7 mg/dL — ABNORMAL LOW (ref 8.9–10.3)
Chloride: 93 mmol/L — ABNORMAL LOW (ref 98–111)
Creatinine, Ser: 0.72 mg/dL (ref 0.44–1.00)
GFR, Estimated: >60 mL/min (ref >60)
Glucose, Bld: 157 mg/dL — ABNORMAL HIGH (ref 70–99)
Potassium: 4 mmol/L (ref 3.5–5.1)
Sodium: 126 mmol/L — ABNORMAL LOW (ref 135–145)
Total Bilirubin: 0.6 mg/dL (ref 0.3–1.2)
Total Protein: 5.7 g/dL — ABNORMAL LOW (ref 6.5–8.1)

## 2022-10-25 LAB — CBC WITH DIFFERENTIAL/PLATELET
Abs Immature Granulocytes: 0.06 10*3/uL (ref 0.00–0.07)
Basophils Absolute: 0.1 10*3/uL (ref 0.0–0.1)
Basophils Relative: 1 %
Eosinophils Absolute: 0.9 10*3/uL — ABNORMAL HIGH (ref 0.0–0.5)
Eosinophils Relative: 8 %
HCT: 33 % — ABNORMAL LOW (ref 36.0–46.0)
Hemoglobin: 10.7 g/dL — ABNORMAL LOW (ref 12.0–15.0)
Immature Granulocytes: 1 %
Lymphocytes Relative: 32 %
Lymphs Abs: 3.7 10*3/uL (ref 0.7–4.0)
MCH: 31.7 pg (ref 26.0–34.0)
MCHC: 32.4 g/dL (ref 30.0–36.0)
MCV: 97.6 fL (ref 80.0–100.0)
Monocytes Absolute: 1.2 10*3/uL — ABNORMAL HIGH (ref 0.1–1.0)
Monocytes Relative: 11 %
Neutro Abs: 5.4 10*3/uL (ref 1.7–7.7)
Neutrophils Relative %: 47 %
Platelets: 462 10*3/uL — ABNORMAL HIGH (ref 150–400)
RBC: 3.38 MIL/uL — ABNORMAL LOW (ref 3.87–5.11)
RDW: 13.5 % (ref 11.5–15.5)
WBC: 11.3 10*3/uL — ABNORMAL HIGH (ref 4.0–10.5)
nRBC: 0 % (ref 0.0–0.2)

## 2022-10-25 LAB — GLUCOSE, CAPILLARY: Glucose-Capillary: 126 mg/dL — ABNORMAL HIGH (ref 70–99)

## 2022-10-25 MED ORDER — TRAMADOL HCL 50 MG PO TABS
50.0000 mg | ORAL_TABLET | Freq: Four times a day (QID) | ORAL | Status: DC | PRN
  Administered 2022-10-25 – 2022-10-26 (×3): 50 mg via ORAL
  Filled 2022-10-25 (×4): qty 1

## 2022-10-25 MED ORDER — AMITRIPTYLINE HCL 50 MG PO TABS
25.0000 mg | ORAL_TABLET | Freq: Every day | ORAL | Status: DC
  Administered 2022-10-25 – 2022-10-26 (×2): 25 mg via ORAL
  Filled 2022-10-25 (×2): qty 1

## 2022-10-25 MED ORDER — SODIUM CHLORIDE 0.9 % IV SOLN
2.0000 g | Freq: Once | INTRAVENOUS | Status: AC
  Administered 2022-10-25: 2 g via INTRAVENOUS
  Filled 2022-10-25: qty 20

## 2022-10-25 MED ORDER — ATORVASTATIN CALCIUM 40 MG PO TABS
40.0000 mg | ORAL_TABLET | Freq: Every day | ORAL | Status: DC
  Administered 2022-10-25 – 2022-11-01 (×7): 40 mg via ORAL
  Filled 2022-10-25 (×7): qty 1

## 2022-10-25 MED ORDER — CARVEDILOL 25 MG PO TABS
25.0000 mg | ORAL_TABLET | Freq: Two times a day (BID) | ORAL | Status: DC
  Administered 2022-10-25 – 2022-10-26 (×3): 25 mg via ORAL
  Filled 2022-10-25 (×3): qty 1

## 2022-10-25 MED ORDER — ENOXAPARIN SODIUM 40 MG/0.4ML IJ SOSY
40.0000 mg | PREFILLED_SYRINGE | INTRAMUSCULAR | Status: DC
  Administered 2022-10-25 – 2022-10-31 (×7): 40 mg via SUBCUTANEOUS
  Filled 2022-10-25 (×7): qty 0.4

## 2022-10-25 MED ORDER — IRBESARTAN 300 MG PO TABS
300.0000 mg | ORAL_TABLET | Freq: Every day | ORAL | Status: DC
  Administered 2022-10-25 – 2022-10-26 (×2): 300 mg via ORAL
  Filled 2022-10-25 (×3): qty 1

## 2022-10-25 MED ORDER — KETOROLAC TROMETHAMINE 60 MG/2ML IM SOLN
30.0000 mg | Freq: Once | INTRAMUSCULAR | Status: AC
  Administered 2022-10-25: 30 mg via INTRAMUSCULAR
  Filled 2022-10-25: qty 2

## 2022-10-25 MED ORDER — SODIUM CHLORIDE 0.9 % IV SOLN: INTRAVENOUS | Status: AC

## 2022-10-25 MED ORDER — CLONAZEPAM 0.5 MG PO TABS
0.5000 mg | ORAL_TABLET | Freq: Two times a day (BID) | ORAL | Status: DC
  Administered 2022-10-25 – 2022-10-26 (×3): 0.5 mg via ORAL
  Filled 2022-10-25 (×3): qty 1

## 2022-10-25 MED ORDER — SODIUM CHLORIDE 0.9 % IV SOLN: INTRAVENOUS | Status: DC

## 2022-10-25 MED ORDER — FENTANYL CITRATE PF 50 MCG/ML IJ SOSY
25.0000 ug | PREFILLED_SYRINGE | INTRAMUSCULAR | Status: DC | PRN
  Administered 2022-10-25 – 2022-10-26 (×5): 25 ug via INTRAVENOUS
  Filled 2022-10-25 (×5): qty 1

## 2022-10-25 MED ORDER — ZOLPIDEM TARTRATE 5 MG PO TABS
5.0000 mg | ORAL_TABLET | Freq: Every evening | ORAL | Status: DC | PRN
  Administered 2022-10-25 – 2022-10-26 (×2): 5 mg via ORAL
  Filled 2022-10-25 (×2): qty 1

## 2022-10-25 MED ORDER — GABAPENTIN 300 MG PO CAPS
300.0000 mg | ORAL_CAPSULE | Freq: Every day | ORAL | Status: DC
  Administered 2022-10-25 – 2022-10-26 (×2): 300 mg via ORAL
  Filled 2022-10-25 (×2): qty 1

## 2022-10-25 MED ORDER — MIRABEGRON ER 50 MG PO TB24
50.0000 mg | ORAL_TABLET | ORAL | Status: DC
  Administered 2022-10-26 – 2022-11-01 (×4): 50 mg via ORAL
  Filled 2022-10-25 (×4): qty 1

## 2022-10-25 MED ORDER — TIZANIDINE HCL 4 MG PO TABS
4.0000 mg | ORAL_TABLET | Freq: Every day | ORAL | Status: DC | PRN
  Administered 2022-10-26 (×2): 4 mg via ORAL
  Filled 2022-10-25 (×2): qty 1

## 2022-10-25 MED ORDER — SODIUM CHLORIDE 0.9 % IV SOLN
1.0000 g | INTRAVENOUS | Status: DC
  Administered 2022-10-26: 1 g via INTRAVENOUS
  Filled 2022-10-25: qty 10

## 2022-10-25 MED ORDER — PANTOPRAZOLE SODIUM 40 MG PO TBEC
40.0000 mg | DELAYED_RELEASE_TABLET | Freq: Every day | ORAL | Status: DC
  Administered 2022-10-25 – 2022-11-01 (×7): 40 mg via ORAL
  Filled 2022-10-25 (×7): qty 1

## 2022-10-25 MED ORDER — LORATADINE 10 MG PO TABS
10.0000 mg | ORAL_TABLET | Freq: Every day | ORAL | Status: DC
  Administered 2022-10-25 – 2022-10-26 (×2): 10 mg via ORAL
  Filled 2022-10-25 (×2): qty 1

## 2022-10-25 MED ORDER — INSULIN ASPART 100 UNIT/ML IJ SOLN
0.0000 [IU] | Freq: Three times a day (TID) | INTRAMUSCULAR | Status: DC
  Administered 2022-10-26 (×2): 1 [IU] via SUBCUTANEOUS
  Administered 2022-10-27 (×2): 2 [IU] via SUBCUTANEOUS

## 2022-10-25 MED ORDER — LEVOTHYROXINE SODIUM 100 MCG PO TABS
100.0000 ug | ORAL_TABLET | Freq: Every day | ORAL | Status: DC
  Administered 2022-10-26 – 2022-10-27 (×2): 100 ug via ORAL
  Filled 2022-10-25 (×2): qty 1

## 2022-10-25 NOTE — H&P (Addendum)
History and Physical    Patient: [name] LOV:[id number] DOB: 1945/09/25 DOA: 10/25/2022 DOS: the patient was seen and examined on 10/25/2022 PCP: Charlane Ferretti, DO  Patient coming from: Home  Chief Complaint:  Chief Complaint  Patient presents with   Knee Pain   HPI: [name] is a 77 y.o. female with medical history significant of hypertension, wheelchair-bound secondary to polio, hypothyroidism, nephrolithiasis, arthritis, chronic back pain, GERD who presents with complaints of right knee pain.  Patient refers to her right knee is "Loopsey" and had been told previously that she had arthritis as a cause for her symptoms.  At home she had been taking over-the-counter arthritis medication without relief.  Denies having any recent falls or injury to that leg.  The patient and her family had gone to the beach week and a half ago and her husband had made a device to help get her on the boat, but in trying to get her on the boat the device failed and injured her left leg causing a open wound.  She had been seen at the emergency department at that time and being given a tetanus shot and advised to dress the wound.  Family note that they have been dressing the wound as advised.  She also had been recently diagnosed with a urinary tract infection and started on Macrobid to complete a 10-day course.  Patient daughter is present at bedside and notes that patient may have missed a dose here and there.  Patient does report still having discomfort with urination that is present.  Family makes note that she gets frequent urinary tract infections.  Normally patient uses a lyses a pure wick at home due to her being immobile.   In the emergency department patient was noted to be afebrile with heart rates 48-55, blood pressures 95/51, and all other vital signs maintained.  Labs significant for hemoglobin 11.3, hemoglobin 10.7, platelets 462, sodium 126.  X-rays of the pelvis and right leg did not show any  acute fracture.  Urinalysis was positive for large leukocytes positive nitrites, and many bacteria.  Patient has been started on empiric antibiotics of Rocephin.  Review of Systems: As mentioned in the history of present illness. All other systems reviewed and are negative. Past Medical History:  Diagnosis Date   Angina pectoris (HCC) 08/02/2016   Anxiety    Arthritis    "hands, knees, back" (07/18/2013)   Asthma    CHF (congestive heart failure) (HCC)    Chronic back pain    Chronic lower back pain    Family history of anesthesia complication    Mother and Sister- N/V   GERD (gastroesophageal reflux disease)    H/O hiatal hernia    Headache(784.0)    "weekly" (07/18/2013)   Hypertension    Hypothyroidism    Insomnia    Kidney stones    Memory changes    Mental disorder    Migraine    "years ago" (07/18/2013)   Osteopenia    Post-polio syndrome    Pure hypercholesterolemia 02/07/2021   Sleep apnea    Takotsubo cardiomyopathy    Type II diabetes mellitus (HCC)    Ulcer of leg, chronic (HCC) 02/07/2021   Past Surgical History:  Procedure Laterality Date   ABDOMINAL HYSTERECTOMY     APPENDECTOMY     CARPAL TUNNEL RELEASE Bilateral    CHOLECYSTECTOMY     COLONOSCOPY     CYSTOSCOPY     IR INJECT/THERA/INC NEEDLE/CATH/PLC EPI/LUMB/SAC W/IMG  07/19/2022   IR INJECT/THERA/INC NEEDLE/CATH/PLC EPI/LUMB/SAC W/IMG  09/11/2022   KNEE ARTHROSCOPY Left    KNEE SURGERY Left    LEG SURGERY Left    polio   ORIF PATELLA Left 07/18/2013   Procedure: OPEN REDUCTION INTERNAL (ORIF) LEFT FIXATION PATELLA;  Surgeon: Verlee Rossetti, MD;  Location: [facility] OR;  Service: Orthopedics;  Laterality: Left;   ORIF PATELLA FRACTURE Left 07/18/2013   TUBAL LIGATION     WISDOM TOOTH EXTRACTION     WRIST TENDON TRANSFER Right    Social History:  reports that she has never smoked. She has never used smokeless tobacco. She reports that she does not drink alcohol and does not use drugs.  Allergies  Allergen  Reactions   Tamsulosin     Other reaction(s): Other (See Comments) Caused uncontrollable movements   Ciprofloxacin Rash   Carisprodol [Carisoprodol] Itching   Penicillins Itching and Other (See Comments)    Unknown Has patient had a PCN reaction causing immediate rash, facial/tongue/throat swelling, SOB or lightheadedness with hypotension: no Has patient had a PCN reaction causing severe rash involving mucus membranes or skin necrosis: unknown Has patient had a PCN reaction that required hospitalization : dr's office Has patient had a PCN reaction occurring within the last 10 years: no If all of the above answers are "NO", then may proceed with Cephalosporin use.    Sulfa Antibiotics Other (See Comments)    unknown Unknown.  States as always told had an allergy   Acetaminophen Itching   Macrodantin [Nitrofurantoin Macrocrystal] Rash   Nsaids Other (See Comments)    Chest pain and stomach pain   Percocet [Oxycodone-Acetaminophen] Itching   Propoxyphene     Chest/stomach pain   Propoxyphene N-Acetaminophen Other (See Comments)    Chest/stomach pain    Family History  Problem Relation Age of Onset   Heart failure Mother    Parkinson's disease Mother    Diabetes Father    Cancer Father    Heart failure Father    Breast cancer Maternal Grandmother    Allergies Grandchild     Prior to Admission medications   Medication Sig Start Date End Date Taking? Authorizing Provider  amitriptyline (ELAVIL) 25 MG tablet Take 25 mg by mouth at bedtime.   Yes [provider]  ascorbic acid (VITAMIN C) 500 MG tablet Take 500 mg by mouth daily.   Yes [provider]  aspirin 81 MG tablet Take 81 mg by mouth at bedtime.    Yes [provider]  atorvastatin (LIPITOR) 40 MG tablet Take 40 mg by mouth daily.   Yes [provider]  b complex vitamins tablet Take 1 tablet by mouth daily.   Yes [provider]  carvedilol (COREG) 25 MG tablet Take 25 mg by  mouth 2 (two) times daily with a meal.   Yes [provider]  Calcium Carbonate-Vitamin D 600-400 MG-UNIT chew tablet Chew 1 tablet by mouth 2 (two) times daily.  Patient not taking: Reported on 08/30/2022    [provider]  clonazePAM (KLONOPIN) 0.5 MG tablet Take 0.5 mg by mouth 2 (two) times daily.    [provider]  gabapentin (NEURONTIN) 300 MG capsule Take 300 mg by mouth at bedtime.    [provider]  Iron, Ferrous Sulfate, 325 (65 Fe) MG TABS Take 1 tablet by mouth daily.    [provider]  levothyroxine (SYNTHROID, LEVOTHROID) 100 MCG tablet Take 100 mcg by mouth daily before breakfast.  [provider]  loratadine (CLARITIN) 10 MG tablet Take 10 mg by mouth daily. 12/17/19   [provider]  metFORMIN (GLUCOPHAGE-XR) 500 MG 24 hr tablet Take 1 tablet by mouth in the morning and at bedtime.  12/24/18   [provider]  mirabegron ER (MYRBETRIQ) 50 MG TB24 tablet Take 50 mg by mouth every other day.     [provider]  Multiple Vitamins-Minerals (MULTIVITAMIN WITH MINERALS) tablet Take 1 tablet by mouth daily.    [provider]  nitroGLYCERIN (NITROSTAT) 0.4 MG SL tablet Place 1 tablet (0.4 mg total) under the tongue every 5 (five) minutes as needed for chest pain. 08/02/21   Alver Sorrow, NP  pantoprazole (PROTONIX) 40 MG tablet Take 1 tablet by mouth daily. Take 1 tab daily 12/21/14   [provider]  spironolactone (ALDACTONE) 25 MG tablet TAKE 1/2 TABLET DAILY 05/26/22   Chilton Si, MD  telmisartan (MICARDIS) 80 MG tablet Take 1 tablet by mouth daily. Take 1 tab daily 12/14/14   [provider]  tiZANidine (ZANAFLEX) 4 MG tablet Take 4 mg by mouth 5 (five) times daily as needed for muscle spasms. PAIN    [provider]  torsemide (DEMADEX) 5 MG tablet Take 5 mg by mouth daily.    [provider]  traMADol (ULTRAM) 50 MG tablet Take 1 tablet (50 mg  total) by mouth every 6 (six) hours as needed. Patient not taking: Reported on 08/30/2022 06/04/22   Lorre Nick, MD  Vitamin D, Ergocalciferol, (DRISDOL) 1.25 MG (50000 UNIT) CAPS capsule Take 50,000 Units by mouth every 7 (seven) days.    [provider]  vitamin E 180 MG (400 UNITS) capsule Take 400 Units by mouth daily.    [provider]  Zinc 50 MG TABS Take 50 mg by mouth daily.    [provider]  zolpidem (AMBIEN) 5 MG tablet Take 5 mg by mouth at bedtime as needed for sleep.    [provider]    Physical Exam: Vitals:   10/25/22 0640 10/25/22 0700 10/25/22 0715 10/25/22 0729  BP: 121/73 114/65 (!) 115/57   Pulse: (!) 55 (!) 50 (!) 52   Resp: 16     Temp: (!) 97.5 F (36.4 C)     TempSrc: Oral     SpO2: 100% 100% 97%   Weight:    77.1 kg  Height:    5' (1.524 m)   Exam  Constitutional: Obese female currently in no acute distress able to follow commands Eyes: PERRL, lids and conjunctivae normal ENMT: Mucous membranes are moist. Normal dentition.  Neck: normal, supple, no masses, no thyromegaly Respiratory: clear to auscultation bilaterally, no wheezing, no crackles. Normal respiratory effort.  Cardiovascular: Regular rate and rhythm, no murmurs / rubs / gallops. 2+ pedal pulses.\ Abdomen: Suprapubic tenderness to palpation appreciated.  Bowel sounds positive.  Musculoskeletal: no clubbing / cyanosis.  Decreased muscle tone of the bilateral lower extremities Skin: Wound of the left lateral aspect of the leg as seen below without significant erythema appreciated.  Neurologic: CN 2-12 grossly intact.  Psychiatric: Normal judgment and insight. Alert and oriented x 3. Normal mood.   Data Reviewed:  Reviewed labs, imaging, and pertinent records as noted in this document.  Assessment and Plan: Urinary tract infection  Patient reported having discomfort with urination.   Patient had been on Macrobid for to complete a 10-day course last  week without improvement in symptoms. Urinalysis positive for large leukocytes,  positive nitrites, and many bacteria. Family note patient has a pure wick system at home due to her being sedimentary and bedbound. -Admit to a medical telemetry bed -Follow-up urine culture -Rocephin IV.  Adjust antibiotics as needed based off cultures  Right leg pain Patient reported having increased pain of the right knee and leg.  Denies having any trauma to the affected knee.  X-ray imaging is of the pelvis and right femur noted osteopenia without acute fracture. -Tramadol/fentanyl IV as needed for pain   Hyponatremia Acute on chronic.  Sodium 126.  Baseline sodium had previously been in the mid 130s. Possibly related to patient's diuretics. -Hold diuretics -Gentle IV fluids at 75 mL/h x 1 liter -Recheck sodium levels in a.m.  Left leg wound Patient suffered a left leg wound while trying to get on the boat last week.  She had been seen and family reported that she was given an tetanus booster.  No erythema or signs of infection appreciated on exam. -Wound care  Controlled diabetes mellitus type 2, without long-term use of insulin Patient appears to be relatively well-controlled on metformin.  Blood sugars 157 on admission -Hypoglycemic protocols -Hold metformin -Carb modified diet -CBGs before every meal with sensitive SSI  Essential hypertension -Hold diuretics due to hyponatremia -Continue all other blood pressure medication  Hyperlipidemia -Continue atorvastatin  Hypothyroidism -Continue levothyroxine  History of polio  wheelchair-bound -Continue gabapentin and Zanaflex as needed  Obesity  BMI 33.2 kg/m   DVT prophylaxis: Lovenox Advance Care Planning:   Code Status: Full Code   Consults: none  Family Communication: Patient's daughter updated at bedside  Severity of Illness: The appropriate patient status for this patient is INPATIENT. Inpatient status is judged to be  reasonable and necessary in order to provide the required intensity of service to ensure the patient's safety. The patient's presenting symptoms, physical exam findings, and initial radiographic and laboratory data in the context of their chronic comorbidities is felt to place them at high risk for further clinical deterioration. Furthermore, it is not anticipated that the patient will be medically stable for discharge from the hospital within 2 midnights of admission.   * I certify that at the point of admission it is my clinical judgment that the patient will require inpatient hospital care spanning beyond 2 midnights from the point of admission due to high intensity of service, high risk for further deterioration and high frequency of surveillance required.*  Author: Clydie Braun, MD 10/25/2022 8:13 AM  For on call review [email or web address].

## 2022-10-25 NOTE — ED Notes (Signed)
ED TO INPATIENT HANDOFF REPORT  ED Nurse Name and Phone #: [phone]  S Name/Age/Gender [name] 77 y.o. female Room/Bed: 021C/021C  Code Status   Code Status: Prior  Home/SNF/Other Home Patient oriented to: self, place, time, and situation Is this baseline? Yes   Triage Complete: Triage complete  Chief Complaint UTI (urinary tract infection) [N39.0]  Triage Note Pt brought in by EMS for right knee pain from her arthritis for the past two months but the past two days has gotten worse. Pain is 10/10 per pt. Per EMS, pt also has a wound left ankle that is wrapped, wound care was suppose to come but they never at her home.    Allergies Allergies  Allergen Reactions   Tamsulosin     Other reaction(s): Other (See Comments) Caused uncontrollable movements   Ciprofloxacin Rash   Carisprodol [Carisoprodol] Itching   Penicillins Itching and Other (See Comments)    Unknown Has patient had a PCN reaction causing immediate rash, facial/tongue/throat swelling, SOB or lightheadedness with hypotension: no Has patient had a PCN reaction causing severe rash involving mucus membranes or skin necrosis: unknown Has patient had a PCN reaction that required hospitalization : dr's office Has patient had a PCN reaction occurring within the last 10 years: no If all of the above answers are "NO", then may proceed with Cephalosporin use.    Sulfa Antibiotics Other (See Comments)    unknown Unknown.  States as always told had an allergy   Acetaminophen Itching   Macrodantin [Nitrofurantoin Macrocrystal] Rash   Nsaids Other (See Comments)    Chest pain and stomach pain   Percocet [Oxycodone-Acetaminophen] Itching   Propoxyphene     Chest/stomach pain   Propoxyphene N-Acetaminophen Other (See Comments)    Chest/stomach pain    Level of Care/Admitting Diagnosis ED Disposition     ED Disposition  Admit   Condition  --   Comment  Hospital Area: [facility]  [[id number]]  Level of Care: Telemetry Medical [104]  May admit patient to [facility] or [facility] if equivalent level of care is available:: No  Covid Evaluation: Asymptomatic - no recent exposure (last 10 days) testing not required  Diagnosis: UTI (urinary tract infection) [161096]  Admitting Physician: Clydie Braun [[id number]]  Attending Physician: Clydie Braun [[id number]]  Certification:: I certify this patient will need inpatient services for at least 2 midnights  Estimated Length of Stay: 2          B Medical/Surgery History Past Medical History:  Diagnosis Date   Angina pectoris (HCC) 08/02/2016   Anxiety    Arthritis    "hands, knees, back" (07/18/2013)   Asthma    CHF (congestive heart failure) (HCC)    Chronic back pain    Chronic lower back pain    Family history of anesthesia complication    Mother and Sister- N/V   GERD (gastroesophageal reflux disease)    H/O hiatal hernia    Headache(784.0)    "weekly" (07/18/2013)   Hypertension    Hypothyroidism    Insomnia    Kidney stones    Memory changes    Mental disorder    Migraine    "years ago" (07/18/2013)   Osteopenia    Post-polio syndrome    Pure hypercholesterolemia 02/07/2021   Sleep apnea    Takotsubo cardiomyopathy    Type II diabetes mellitus (HCC)    Ulcer of leg, chronic (HCC) 02/07/2021   Past Surgical History:  Procedure Laterality Date   ABDOMINAL HYSTERECTOMY     APPENDECTOMY     CARPAL TUNNEL RELEASE Bilateral    CHOLECYSTECTOMY     COLONOSCOPY     CYSTOSCOPY     IR INJECT/THERA/INC NEEDLE/CATH/PLC EPI/LUMB/SAC W/IMG  07/19/2022   IR INJECT/THERA/INC NEEDLE/CATH/PLC EPI/LUMB/SAC W/IMG  09/11/2022   KNEE ARTHROSCOPY Left    KNEE SURGERY Left    LEG SURGERY Left    polio   ORIF PATELLA Left 07/18/2013   Procedure: OPEN REDUCTION INTERNAL (ORIF) LEFT FIXATION PATELLA;  Surgeon: Verlee Rossetti, MD;  Location: [facility] OR;  Service: Orthopedics;  Laterality: Left;   ORIF PATELLA FRACTURE Left  07/18/2013   TUBAL LIGATION     WISDOM TOOTH EXTRACTION     WRIST TENDON TRANSFER Right      A IV Location/Drains/Wounds Patient Lines/Drains/Airways Status     Active Line/Drains/Airways     Name Placement date Placement time Site Days   Peripheral IV 10/25/22 22 G 1.16" Anterior;Left Forearm 10/25/22  0741  Forearm  less than 1            Intake/Output Last 24 hours  Intake/Output Summary (Last 24 hours) at 10/25/2022 6295 Last data filed at 10/25/2022 0845 Gross per 24 hour  Intake 100 ml  Output --  Net 100 ml    Labs/Imaging Results for orders placed or performed during the hospital encounter of 10/25/22 (from the past 48 hour(s))  Urinalysis, Routine w reflex microscopic -Urine, Catheterized     Status: Abnormal   Collection Time: 10/25/22  5:33 AM  Result Value Ref Range   Color, Urine YELLOW YELLOW   APPearance CLOUDY (A) CLEAR   Specific Gravity, Urine 1.006 1.005 - 1.030   pH 5.0 5.0 - 8.0   Glucose, UA NEGATIVE NEGATIVE mg/dL   Hgb urine dipstick SMALL (A) NEGATIVE   Bilirubin Urine NEGATIVE NEGATIVE   Ketones, ur NEGATIVE NEGATIVE mg/dL   Protein, ur NEGATIVE NEGATIVE mg/dL   Nitrite POSITIVE (A) NEGATIVE   Leukocytes,Ua LARGE (A) NEGATIVE   RBC / HPF 0-5 0 - 5 RBC/hpf   WBC, UA >50 0 - 5 WBC/hpf   Bacteria, UA MANY (A) NONE SEEN   Squamous Epithelial / HPF 0-5 0 - 5 /HPF   WBC Clumps PRESENT     Comment: Performed at [facility], [address], [place], [place] [address]  CBC with Differential     Status: Abnormal   Collection Time: 10/25/22  6:02 AM  Result Value Ref Range   WBC 11.3 (H) 4.0 - 10.5 K/uL   RBC 3.38 (L) 3.87 - 5.11 MIL/uL   Hemoglobin 10.7 (L) 12.0 - 15.0 g/dL   HCT 24.4 (L) 01.0 - 27.2 %   MCV 97.6 80.0 - 100.0 fL   MCH 31.7 26.0 - 34.0 pg   MCHC 32.4 30.0 - 36.0 g/dL   RDW 53.6 64.4 - 03.4 %   Platelets 462 (H) 150 - 400 K/uL   nRBC 0.0 0.0 - 0.2 %   Neutrophils Relative % 47 %   Neutro Abs 5.4 1.7 - 7.7 K/uL    Lymphocytes Relative 32 %   Lymphs Abs 3.7 0.7 - 4.0 K/uL   Monocytes Relative 11 %   Monocytes Absolute 1.2 (H) 0.1 - 1.0 K/uL   Eosinophils Relative 8 %   Eosinophils Absolute 0.9 (H) 0.0 - 0.5 K/uL   Basophils Relative 1 %   Basophils Absolute 0.1 0.0 - 0.1 K/uL   Immature  Granulocytes 1 %   Abs Immature Granulocytes 0.06 0.00 - 0.07 K/uL    Comment: Performed at [facility], [address] [place], [place] [address]  Comprehensive metabolic panel     Status: Abnormal   Collection Time: 10/25/22  6:02 AM  Result Value Ref Range   Sodium 126 (L) 135 - 145 mmol/L   Potassium 4.0 3.5 - 5.1 mmol/L   Chloride 93 (L) 98 - 111 mmol/L   CO2 22 22 - 32 mmol/L   Glucose, Bld 157 (H) 70 - 99 mg/dL    Comment: Glucose reference range applies only to samples taken after fasting for at least 8 hours.   BUN 8 8 - 23 mg/dL   Creatinine, Ser 2.13 0.44 - 1.00 mg/dL   Calcium 8.7 (L) 8.9 - 10.3 mg/dL   Total Protein 5.7 (L) 6.5 - 8.1 g/dL   Albumin 2.6 (L) 3.5 - 5.0 g/dL   AST 19 15 - 41 U/L   ALT 15 0 - 44 U/L   Alkaline Phosphatase 60 38 - 126 U/L   Total Bilirubin 0.6 0.3 - 1.2 mg/dL   GFR, Estimated >08 >65 mL/min    Comment: (NOTE) Calculated using the CKD-EPI Creatinine Equation (2021)    Anion gap 11 5 - 15    Comment: Performed at [facility], [address] [place], [place] [address]   DG Pelvis 1-2 Views  Result Date: 10/25/2022 CLINICAL DATA:  [id number] with leg pain for 2 weeks. EXAM: PELVIS - 1-2 VIEW COMPARISON:  CT abdomen and pelvis and reconstructions 06/04/2022 FINDINGS: There is no evidence of pelvic fracture or diastasis. No pelvic bone lesions are seen. There is osteopenia and mild-to-moderate joint space loss of both hips, spurring of the SI joints and pubic symphysis. There are degenerative changes of the visualized lower lumbar spine with partially visible lumbar levoscoliosis. Calcifications are again noted in the internal iliac arteries. Compare: Stable  radiographically. IMPRESSION: 1. Osteopenia and degenerative change without evidence of fractures. 2. Vascular calcifications. 3. Partially visible lumbar levoscoliosis and degenerative changes. Electronically Signed   By: Almira Bar M.D.   On: 10/25/2022 03:26   DG Femur Min 2 Views Right  Result Date: 10/25/2022 CLINICAL DATA:  Leg pain EXAM: RIGHT FEMUR 2 VIEWS COMPARISON:  None Available. FINDINGS: There is no evidence of fracture or other focal bone lesions. Soft tissues are unremarkable. IMPRESSION: Negative. Electronically Signed   By: Charlett Nose M.D.   On: 10/25/2022 03:22    Pending Labs Unresulted Labs (From admission, onward)     Start     Ordered   10/25/22 0458  Urine Culture (for pregnant, neutropenic or urologic patients or patients with an indwelling urinary catheter)  (Urine Labs)  Once,   URGENT       Question:  Indication  Answer:  Suprapubic pain   10/25/22 0458            Vitals/Pain Today's Vitals   10/25/22 0755 10/25/22 0800 10/25/22 0815 10/25/22 0930  BP:  (!) 95/51 106/81 113/70  Pulse:  (!) 50 (!) 55 (!) 48  Resp:    18  Temp:      TempSrc:      SpO2:  100% 99% 98%  Weight:      Height:      PainSc: 10-Worst pain ever       Isolation Precautions No active isolations  Medications Medications  ketorolac (TORADOL) injection 30 mg (30 mg Intramuscular  Given 10/25/22 0258)  cefTRIAXone (ROCEPHIN) 2 g in sodium chloride 0.9 % 100 mL IVPB (0 g Intravenous Stopped 10/25/22 0845)    Mobility non-ambulatory     Focused Assessments Wound on RLE   R Recommendations: See Admitting Provider Note  Report given to:   Additional Notes: patient presented for right knee pain, but admitting for leg wound. Rocephin given.

## 2022-10-25 NOTE — ED Notes (Signed)
PIV attempted by this RN x2, unsuccessful. Will need USIV consult.

## 2022-10-25 NOTE — ED Provider Notes (Signed)
[place] EMERGENCY DEPARTMENT AT [facility] Provider Note  CSN: [id number] Arrival date & time: 10/25/22 0205  Chief Complaint(s) Knee Pain  HPI [name] is a 77 y.o. female with a past medical history listed below including polio resulting in being wheelchair-bound who presents to the emergency department with right knee and leg pain.  This is a chronic issue but was severe last night prompting her visit.  She denies any fall or trauma.  No redness or swelling.  Patient reported that she sustained any left leg wound while [place] the beach.  This was being cared for by her husband with wrapping and cleaning.  She denies any pain surrounding the wound.  I also spoke to the patient's daughter over the phone who reported that the patient was being treated for urinary tract infection.  The patient nor the daughter remember the name of the antibiotic.  Patient reports that she still having suprapubic discomfort.  The history is provided by the patient.    Past Medical History Past Medical History:  Diagnosis Date   Angina pectoris (HCC) 08/02/2016   Anxiety    Arthritis    "hands, knees, back" (07/18/2013)   Asthma    CHF (congestive heart failure) (HCC)    Chronic back pain    Chronic lower back pain    Family history of anesthesia complication    Mother and Sister- N/V   GERD (gastroesophageal reflux disease)    H/O hiatal hernia    Headache(784.0)    "weekly" (07/18/2013)   Hypertension    Hypothyroidism    Insomnia    Kidney stones    Memory changes    Mental disorder    Migraine    "years ago" (07/18/2013)   Osteopenia    Post-polio syndrome    Pure hypercholesterolemia 02/07/2021   Sleep apnea    Takotsubo cardiomyopathy    Type II diabetes mellitus (HCC)    Ulcer of leg, chronic (HCC) 02/07/2021   Patient Active Problem List   Diagnosis Date Noted   Wheelchair dependence 05/30/2021   Ulcer of leg, chronic (HCC) 02/07/2021   Pure hypercholesterolemia  02/07/2021   Angina pectoris (HCC) 08/02/2016   Post-polio syndrome 07/20/2013            07/20/2013   Left patella fracture 07/18/2013   HYPOTHYROIDISM 03/12/2009   DM 03/12/2009   Essential hypertension 03/12/2009   ATRIAL FIBRILLATION 03/12/2009   TAKOTSUBO SYNDROME 03/12/2009   Edema 03/12/2009   Home Medication(s) Prior to Admission medications   Medication Sig Start Date End Date Taking? Authorizing Provider  amitriptyline (ELAVIL) 25 MG tablet Take 25 mg by mouth at bedtime.   Yes [provider]  ascorbic acid (VITAMIN C) 500 MG tablet Take 500 mg by mouth daily.   Yes [provider]  aspirin 81 MG tablet Take 81 mg by mouth at bedtime.    Yes [provider]  atorvastatin (LIPITOR) 40 MG tablet Take 40 mg by mouth daily.   Yes [provider]  b complex vitamins tablet Take 1 tablet by mouth daily.   Yes [provider]  carvedilol (COREG) 25 MG tablet Take 25 mg by mouth 2 (two) times daily with a meal.   Yes [provider]  Calcium Carbonate-Vitamin D 600-400 MG-UNIT chew tablet Chew 1 tablet by mouth 2 (two) times daily.  Patient not taking: Reported on 08/30/2022    [provider]  clonazePAM (KLONOPIN) 0.5 MG tablet Take 0.5  mg by mouth 2 (two) times daily.    [provider]  gabapentin (NEURONTIN) 300 MG capsule Take 300 mg by mouth at bedtime.    [provider]  Iron, Ferrous Sulfate, 325 (65 Fe) MG TABS Take 1 tablet by mouth daily.    [provider]  levothyroxine (SYNTHROID, LEVOTHROID) 100 MCG tablet Take 100 mcg by mouth daily before breakfast.    [provider]  loratadine (CLARITIN) 10 MG tablet Take 10 mg by mouth daily. 12/17/19   [provider]  metFORMIN (GLUCOPHAGE-XR) 500 MG 24 hr tablet Take 1 tablet by mouth in the morning and at bedtime.  12/24/18   [provider]  mirabegron ER (MYRBETRIQ) 50 MG TB24 tablet Take 50 mg by mouth every  other day.     [provider]  Multiple Vitamins-Minerals (MULTIVITAMIN WITH MINERALS) tablet Take 1 tablet by mouth daily.    [provider]  nitroGLYCERIN (NITROSTAT) 0.4 MG SL tablet Place 1 tablet (0.4 mg total) under the tongue every 5 (five) minutes as needed for chest pain. 08/02/21   Alver Sorrow, NP  pantoprazole (PROTONIX) 40 MG tablet Take 1 tablet by mouth daily. Take 1 tab daily 12/21/14   [provider]  spironolactone (ALDACTONE) 25 MG tablet TAKE 1/2 TABLET DAILY 05/26/22   Chilton Si, MD  telmisartan (MICARDIS) 80 MG tablet Take 1 tablet by mouth daily. Take 1 tab daily 12/14/14   [provider]  tiZANidine (ZANAFLEX) 4 MG tablet Take 4 mg by mouth 5 (five) times daily as needed for muscle spasms. PAIN    [provider]  torsemide (DEMADEX) 5 MG tablet Take 5 mg by mouth daily.    [provider]  traMADol (ULTRAM) 50 MG tablet Take 1 tablet (50 mg total) by mouth every 6 (six) hours as needed. Patient not taking: Reported on 08/30/2022 06/04/22   Lorre Nick, MD  Vitamin D, Ergocalciferol, (DRISDOL) 1.25 MG (50000 UNIT) CAPS capsule Take 50,000 Units by mouth every 7 (seven) days.    [provider]  vitamin E 180 MG (400 UNITS) capsule Take 400 Units by mouth daily.    [provider]  Zinc 50 MG TABS Take 50 mg by mouth daily.    [provider]  zolpidem (AMBIEN) 5 MG tablet Take 5 mg by mouth at bedtime as needed for sleep.    [provider]                                                                                                                                    Allergies Tamsulosin, Ciprofloxacin, Carisprodol [carisoprodol], Penicillins, Sulfa antibiotics, Acetaminophen, Macrodantin [nitrofurantoin macrocrystal], Nsaids, Percocet [oxycodone-acetaminophen], Propoxyphene, and Propoxyphene n-acetaminophen  Review of Systems Review of Systems As noted in  HPI  Physical Exam Vital Signs  I have reviewed the triage vital signs BP (!) 115/57   Pulse (!) 52  Temp (!) 97.5 F (36.4 C) (Oral)   Resp 16   SpO2 97%   Physical Exam Vitals reviewed.  Constitutional:      General: She is not in acute distress.    Appearance: She is well-developed. She is not diaphoretic.  HENT:     Head: Normocephalic and atraumatic.     Right Ear: External ear normal.     Left Ear: External ear normal.     Nose: Nose normal.  Eyes:     General: No scleral icterus.    Conjunctiva/sclera: Conjunctivae normal.  Neck:     Trachea: Phonation normal.  Cardiovascular:     Rate and Rhythm: Normal rate and regular rhythm.  Pulmonary:     Effort: Pulmonary effort is normal. No respiratory distress.     Breath sounds: No stridor.  Abdominal:     General: There is no distension.     Tenderness: There is abdominal tenderness in the suprapubic area.  Musculoskeletal:        General: Normal range of motion.     Cervical back: Normal range of motion.     Right upper leg: Tenderness present. No bony tenderness.     Right lower leg: 2+ Pitting Edema present.     Left lower leg: 2+ Pitting Edema present.       Legs:  Neurological:     Mental Status: She is alert and oriented to person, place, and time.  Psychiatric:        Behavior: Behavior normal.     ED Results and Treatments Labs (all labs ordered are listed, but only abnormal results are displayed) Labs Reviewed  URINALYSIS, ROUTINE W REFLEX MICROSCOPIC - Abnormal; Notable for the following components:      Result Value   APPearance CLOUDY (*)    Hgb urine dipstick SMALL (*)    Nitrite POSITIVE (*)    Leukocytes,Ua LARGE (*)    Bacteria, UA MANY (*)    All other components within normal limits  CBC WITH DIFFERENTIAL/PLATELET - Abnormal; Notable for the following components:   WBC 11.3 (*)    RBC 3.38 (*)    Hemoglobin 10.7 (*)    HCT 33.0 (*)    Platelets 462 (*)    Monocytes Absolute 1.2  (*)    Eosinophils Absolute 0.9 (*)    All other components within normal limits  COMPREHENSIVE METABOLIC PANEL - Abnormal; Notable for the following components:   Sodium 126 (*)    Chloride 93 (*)    Glucose, Bld 157 (*)    Calcium 8.7 (*)    Total Protein 5.7 (*)    Albumin 2.6 (*)    All other components within normal limits  URINE CULTURE                                                                                                                         EKG  EKG Interpretation  Date/Time:    Ventricular Rate:  PR Interval:    QRS Duration:   QT Interval:    QTC Calculation:   R Axis:     Text Interpretation:         Radiology DG Pelvis 1-2 Views  Result Date: 10/25/2022 CLINICAL DATA:  94497 with leg pain for 2 weeks. EXAM: PELVIS - 1-2 VIEW COMPARISON:  CT abdomen and pelvis and reconstructions 06/04/2022 FINDINGS: There is no evidence of pelvic fracture or diastasis. No pelvic bone lesions are seen. There is osteopenia and mild-to-moderate joint space loss of both hips, spurring of the SI joints and pubic symphysis. There are degenerative changes of the visualized lower lumbar spine with partially visible lumbar levoscoliosis. Calcifications are again noted in the internal iliac arteries. Compare: Stable radiographically. IMPRESSION: 1. Osteopenia and degenerative change without evidence of fractures. 2. Vascular calcifications. 3. Partially visible lumbar levoscoliosis and degenerative changes. Electronically Signed   By: Almira Bar M.D.   On: 10/25/2022 03:26   DG Femur Min 2 Views Right  Result Date: 10/25/2022 CLINICAL DATA:  Leg pain EXAM: RIGHT FEMUR 2 VIEWS COMPARISON:  None Available. FINDINGS: There is no evidence of fracture or other focal bone lesions. Soft tissues are unremarkable. IMPRESSION: Negative. Electronically Signed   By: Charlett Nose M.D.   On: 10/25/2022 03:22    Medications Ordered in ED Medications  cefTRIAXone (ROCEPHIN) 2 g in sodium  chloride 0.9 % 100 mL IVPB (has no administration in time range)  ketorolac (TORADOL) injection 30 mg (30 mg Intramuscular Given 10/25/22 0258)   Procedures Procedures  (including critical care time) Medical Decision Making / ED Course   Medical Decision Making Amount and/or Complexity of Data Reviewed Labs: ordered. Decision-making details documented in ED Course. Radiology: ordered and independent interpretation performed. Decision-making details documented in ED Course.  Risk Prescription drug management. Decision regarding hospitalization.    Patient initially presented for right leg pain.  No reported trauma.  Baseline edema.  No evidence of infection and the right lower extremity.  Patient does have a history of osteopenia, will rule out occult fracture but I have low suspicion for this. Plain films negative  Patient also  reported left lower extremity wound.  It is nontender with granulated tissue mildly hyperemic.  Does not appear to be infected.  Also having suprapubic discomfort and recent treatment for UTI. UA is consistent with urinary tract infection.  On review of  records, Urine cultures have grown out numerous bacterial strains.  CBC with leukocytosis.  Metabolic panel with  hyponatremia.  Unsure of etiology but possibly due to volume overload given patient's exam.  Patient started on IV Rocephin.  Urine culture sent.  Patient has failed outpatient management for the urinary tract infection so we will discuss admission to await cultures and work up hyponatremia.    Final Clinical Impression(s) / ED Diagnoses Final diagnoses:  Acute cystitis without hematuria  Wound of left lower extremity, initial encounter  Hyponatremia    This chart was dictated using voice recognition software.  Despite best efforts to proofread,  errors can occur which can change the documentation meaning.    Nira Conn, MD 10/25/22 [phone]

## 2022-10-25 NOTE — ED Triage Notes (Signed)
Pt brought in by EMS for right knee pain from her arthritis for the past two months but the past two days has gotten worse. Pain is 10/10 per pt. Per EMS, pt also has a wound left ankle that is wrapped, wound care was suppose to come but they never at her home.

## 2022-10-26 DIAGNOSIS — N3000 Acute cystitis without hematuria: Secondary | ICD-10-CM | POA: Diagnosis not present

## 2022-10-26 LAB — CBC
HCT: 36.6 % (ref 36.0–46.0)
Hemoglobin: 12 g/dL (ref 12.0–15.0)
MCH: 31.8 pg (ref 26.0–34.0)
MCHC: 32.8 g/dL (ref 30.0–36.0)
MCV: 97.1 fL (ref 80.0–100.0)
Platelets: 470 10*3/uL — ABNORMAL HIGH (ref 150–400)
RBC: 3.77 MIL/uL — ABNORMAL LOW (ref 3.87–5.11)
RDW: 13.3 % (ref 11.5–15.5)
WBC: 13.2 10*3/uL — ABNORMAL HIGH (ref 4.0–10.5)
nRBC: 0 % (ref 0.0–0.2)

## 2022-10-26 LAB — URINE CULTURE

## 2022-10-26 LAB — HEMOGLOBIN A1C
Hgb A1c MFr Bld: 5.7 % — ABNORMAL HIGH (ref 4.8–5.6)
Mean Plasma Glucose: 116.89 mg/dL

## 2022-10-26 LAB — BASIC METABOLIC PANEL
Anion gap: 16 — ABNORMAL HIGH (ref 5–15)
BUN: 8 mg/dL (ref 8–23)
CO2: 14 mmol/L — ABNORMAL LOW (ref 22–32)
Calcium: 8.5 mg/dL — ABNORMAL LOW (ref 8.9–10.3)
Chloride: 91 mmol/L — ABNORMAL LOW (ref 98–111)
Creatinine, Ser: 0.41 mg/dL — ABNORMAL LOW (ref 0.44–1.00)
GFR, Estimated: >60 mL/min (ref >60)
Glucose, Bld: 164 mg/dL — ABNORMAL HIGH (ref 70–99)
Potassium: 4.2 mmol/L (ref 3.5–5.1)
Sodium: 121 mmol/L — ABNORMAL LOW (ref 135–145)

## 2022-10-26 LAB — GLUCOSE, CAPILLARY
Glucose-Capillary: 158 mg/dL — ABNORMAL HIGH (ref 70–99)
Glucose-Capillary: 193 mg/dL — ABNORMAL HIGH (ref 70–99)
Glucose-Capillary: 163 mg/dL — ABNORMAL HIGH (ref 70–99)
Glucose-Capillary: 143 mg/dL — ABNORMAL HIGH (ref 70–99)
Glucose-Capillary: 129 mg/dL — ABNORMAL HIGH (ref 70–99)

## 2022-10-26 LAB — TSH: TSH: 14.427 u[IU]/mL — ABNORMAL HIGH (ref 0.350–4.500)

## 2022-10-26 LAB — OSMOLALITY, URINE: Osmolality, Ur: 255 mOsm/kg — ABNORMAL LOW (ref 300–900)

## 2022-10-26 LAB — OSMOLALITY: Osmolality: 258 mOsm/kg — ABNORMAL LOW (ref 275–295)

## 2022-10-26 LAB — URIC ACID: Uric Acid, Serum: 6.2 mg/dL (ref 2.5–7.1)

## 2022-10-26 LAB — SODIUM, URINE, RANDOM: Sodium, Ur: 24 mmol/L

## 2022-10-26 MED ORDER — POLYETHYLENE GLYCOL 3350 17 G PO PACK: 17.0000 g | PACK | Freq: Every day | ORAL | Status: DC | PRN

## 2022-10-26 MED ORDER — PROCHLORPERAZINE EDISYLATE 10 MG/2ML IJ SOLN: 5.0000 mg | Freq: Four times a day (QID) | INTRAMUSCULAR | Status: DC | PRN

## 2022-10-26 MED ORDER — SENNA 8.6 MG PO TABS
1.0000 | ORAL_TABLET | Freq: Two times a day (BID) | ORAL | Status: DC
  Administered 2022-10-26 – 2022-11-01 (×12): 8.6 mg via ORAL
  Filled 2022-10-26 (×12): qty 1

## 2022-10-26 MED ORDER — POLYETHYLENE GLYCOL 3350 17 G PO PACK
17.0000 g | PACK | Freq: Every day | ORAL | Status: DC
  Administered 2022-10-26 – 2022-11-01 (×6): 17 g via ORAL
  Filled 2022-10-26 (×6): qty 1

## 2022-10-26 MED ORDER — BENZONATATE 100 MG PO CAPS
200.0000 mg | ORAL_CAPSULE | Freq: Three times a day (TID) | ORAL | Status: DC | PRN
  Administered 2022-10-26: 200 mg via ORAL
  Filled 2022-10-26: qty 2

## 2022-10-26 MED ORDER — PROCHLORPERAZINE EDISYLATE 10 MG/2ML IJ SOLN
5.0000 mg | INTRAMUSCULAR | Status: AC
  Administered 2022-10-26: 5 mg via INTRAVENOUS
  Filled 2022-10-26: qty 2

## 2022-10-26 MED ORDER — HYDROMORPHONE HCL 1 MG/ML IJ SOLN
0.5000 mg | INTRAMUSCULAR | Status: AC
  Administered 2022-10-26: 0.5 mg via INTRAVENOUS
  Filled 2022-10-26: qty 0.5

## 2022-10-26 MED ORDER — GUAIFENESIN-DM 100-10 MG/5ML PO SYRP
5.0000 mL | ORAL_SOLUTION | ORAL | Status: DC | PRN
  Administered 2022-10-26: 5 mL via ORAL
  Filled 2022-10-26: qty 10

## 2022-10-26 MED ORDER — LIDOCAINE 5 % EX PTCH
1.0000 | MEDICATED_PATCH | CUTANEOUS | Status: DC
  Administered 2022-10-26 – 2022-10-31 (×5): 1 via TRANSDERMAL
  Filled 2022-10-26 (×5): qty 1

## 2022-10-26 NOTE — Progress Notes (Signed)
PROGRESS NOTE  [name]  ZOX:[id number] DOB: 18-Jun-1945 DOA: 10/25/2022 PCP: Charlane Ferretti, DO   Brief Narrative: Patient is 77 year old female with history of hypertension, wheelchair-bound secondary to polio, hypothyroidism, nephrolithiasis, chronic back pain, GERD who presented with right knee pain.  Patient was also recently diagnosed with UTI and was on Macrobid.  Patient was having dysuria on presentation.  On presentation, she was hemodynamically stable but had soft blood pressure.  Labs showed sodium of 126.  X-ray of the pelvis and right leg did not show any fracture or dislocation.  Urinalysis shows you have positive nitrates, many bacteria.Urine culture showing Ecoli.  Currently on ceftriaxone.   Assessment & Plan:  Principal Problem:   UTI (urinary tract infection) Active Problems:   Right knee pain   Hyponatremia   Leg wound, left   Controlled type 2 diabetes mellitus without complication, without long-term current use of insulin (HCC)   Essential hypertension   Hyperlipidemia   Hypothyroidism   Post-polio syndrome   Wheelchair dependence   Obesity (BMI 30-39.9)   UTI: Presented with dysuria.  Had mild leukocytosis presentation.  UA suggestive of UTI.  Was recently taking Macrobid for UTI without improvement.  Started on ceftriaxone.  Urine culture showing E. coli, will follow-up susceptibility.  Remains afebrile  Right knee pain/left leg wound: Her main concern is right knee pain.  Patient has history of arthritis and follows with orthopedics and takes steroid shots.  This is most likely from osteoarthritis.  X-ray did not show any fracture or dislocation.  Continue supportive care Sustained left leg wound while trying to get on a boat last week.  Already had tetanus booster at emergency department.  No signs of infection.  Continue wound care  Hyponatremia: Baseline sodium in the 130s.  Patient was taking diuretics at home.  Started on gentle IV fluids.  Further drop  in the sodium to 121.  This is most likely SIADH.  Will continue fluid restriction.  Will check SIADH panel with urine sodium, urine osmolality, plasma osmolality, TSH   Diabetes type 2: Appears well-controlled on metformin.  Continue sliding scale here, monitor blood sugars  Hypertension: Hypotensive on presentation.  Monitor blood pressure.  Currently stable  Hyperlipidemia: On Lipitor  Hypothyroidism: On Synthyroid  History of polio/wheelchair-bound/debility: On gabapentin, Zanaflex.  Patient is nonambulatory.  Obesity: BMI 33.2          DVT prophylaxis:enoxaparin (LOVENOX) injection 40 mg Start: 10/25/22 1800     Code Status: Full Code  Family Communication: Husband at bedside  Patient status:Inpatient  Patient is from :Home  Anticipated discharge WJ:XBJY  Estimated DC date:1-2 days   Consultants: None  Procedures:None  Antimicrobials:  Anti-infectives (From admission, onward)    Start     Dose/Rate Route Frequency Ordered Stop   10/26/22 0800  cefTRIAXone (ROCEPHIN) 1 g in sodium chloride 0.9 % 100 mL IVPB        1 g 200 mL/hr over 30 Minutes Intravenous Every 24 hours 10/25/22 2117     10/25/22 0630  cefTRIAXone (ROCEPHIN) 2 g in sodium chloride 0.9 % 100 mL IVPB        2 g 200 mL/hr over 30 Minutes Intravenous  Once 10/25/22 7829 10/25/22 0845       Subjective: Patient seen and examined at bedside today.  Her main concern is right knee pain.  She continues to complain of right knee pain.  Right knee examined at the bedside, it does not appear red or swollen.  Has tenderness.  Again explained the findings of x-ray.  Denies dysuria this morning.  Objective: Vitals:   10/26/22 0033 10/26/22 0215 10/26/22 0502 10/26/22 0729  BP: (!) 159/62 (!) 154/67 109/76 118/81  Pulse: 62 60 66 68  Resp: 18  16 18   Temp: (!) 97.5 F (36.4 C)  98.7 F (37.1 C)   TempSrc:   Oral   SpO2: 96% 97% 94% 95%  Weight:      Height:        Intake/Output Summary (Last  24 hours) at 10/26/2022 0752 Last data filed at 10/25/2022 2030 Gross per 24 hour  Intake 100 ml  Output 500 ml  Net -400 ml   Filed Weights   10/25/22 0729  Weight: 77.1 kg    Examination:  General exam: Overall comfortable, not in distress, morbidly obese HEENT: PERRL Respiratory system:  no wheezes or crackles  Cardiovascular system: S1 & S2 heard, RRR.  Gastrointestinal system: Abdomen is nondistended, soft and nontender. Central nervous system: Alert and oriented Extremities: No edema, no clubbing ,no cyanosis, contractures of lower extremities, wound on the left leg Skin: No rashes, no ulcers,no icterus     Data Reviewed: I have personally reviewed following labs and imaging studies  CBC: Recent Labs  Lab 10/25/22 0602  WBC 11.3*  NEUTROABS 5.4  HGB 10.7*  HCT 33.0*  MCV 97.6  PLT 462*   Basic Metabolic Panel: Recent Labs  Lab 10/25/22 0602  NA 126*  K 4.0  CL 93*  CO2 22  GLUCOSE 157*  BUN 8  CREATININE 0.72  CALCIUM 8.7*     Recent Results (from the past 240 hour(s))  Urine Culture (for pregnant, neutropenic or urologic patients or patients with an indwelling urinary catheter)     Status: Abnormal (Preliminary result)   Collection Time: 10/25/22  5:33 AM   Specimen: In/Out Cath Urine  Result Value Ref Range Status   Specimen Description IN/OUT CATH URINE  Final   Special Requests   Final    NONE Performed at [facility], [address], [place], [place] [address]    Culture >=100,000 COLONIES/mL GRAM NEGATIVE RODS (A)  Final   Report Status PENDING  Incomplete     Radiology Studies: DG Pelvis 1-2 Views  Result Date: 10/25/2022 CLINICAL DATA:  [id number] with leg pain for 2 weeks. EXAM: PELVIS - 1-2 VIEW COMPARISON:  CT abdomen and pelvis and reconstructions 06/04/2022 FINDINGS: There is no evidence of pelvic fracture or diastasis. No pelvic bone lesions are seen. There is osteopenia and mild-to-moderate joint space loss of both hips,  spurring of the SI joints and pubic symphysis. There are degenerative changes of the visualized lower lumbar spine with partially visible lumbar levoscoliosis. Calcifications are again noted in the internal iliac arteries. Compare: Stable radiographically. IMPRESSION: 1. Osteopenia and degenerative change without evidence of fractures. 2. Vascular calcifications. 3. Partially visible lumbar levoscoliosis and degenerative changes. Electronically Signed   By: Almira Bar M.D.   On: 10/25/2022 03:26   DG Femur Min 2 Views Right  Result Date: 10/25/2022 CLINICAL DATA:  Leg pain EXAM: RIGHT FEMUR 2 VIEWS COMPARISON:  None Available. FINDINGS: There is no evidence of fracture or other focal bone lesions. Soft tissues are unremarkable. IMPRESSION: Negative. Electronically Signed   By: Charlett Nose M.D.   On: 10/25/2022 03:22    Scheduled Meds:  amitriptyline  25 mg Oral QHS   atorvastatin  40 mg Oral Daily   carvedilol  25  mg Oral BID WC   clonazePAM  0.5 mg Oral BID   enoxaparin (LOVENOX) injection  40 mg Subcutaneous Q24H   gabapentin  300 mg Oral QHS   insulin aspart  0-6 Units Subcutaneous TID WC   irbesartan  300 mg Oral Daily   levothyroxine  100 mcg Oral QAC breakfast   loratadine  10 mg Oral Daily   mirabegron ER  50 mg Oral QODAY   pantoprazole  40 mg Oral Daily   Continuous Infusions:  cefTRIAXone (ROCEPHIN)  IV       LOS: 1 day   Burnadette Pop, MD [facility] P6/13/2024, 7:52 AM

## 2022-10-26 NOTE — Consult Note (Signed)
WOC Nurse Consult Note: Reason for Consult:left leg wound Sustained injury from transfer device, engineered by family to assist with getting patient on and off a boat at the beach.  Wound type: trauma/skin tear Pressure Injury POA: NA Measurement: see nursing notes Wound ZOX:WRUE flap missing over majority of the wound bed, 100% pink, 100% granulation tissue Drainage (amount, consistency, odor) scant, serosanguinous  Periwound:edema  Dressing procedure/placement/frequency: Single layer of xeroform gauze, change every other day. Cover with foam dressing or wrap with kerlix/conform gauze.    Re consult if needed, will not follow at this time. Thanks  Rayen Palen [employer], RN,CWOCN, CNS, CWON-AP [phone])

## 2022-10-26 NOTE — TOC Initial Note (Signed)
Transition of Care [facility]) - Initial/Assessment Note    Patient Details  Name: [name] MRN: [id number] Date of Birth: 1945-07-04  Transition [facility]) CM/SW Contact:    Janae Bridgeman, RN Phone Number: 10/26/2022, 12:31 PM  Clinical Narrative:                 Cm met with the patient and daughter, [name] in the room to discuss TOC needs.  The patient lives at home with her husband who provides 24 hour care along with assistance from family/private caregivers.  Patient does not have a home health agency involved in the home at this time.  Patient has all DME in the home at this time.  Patient was admitted for UTI with pending urine cultures, IVF and low Na level and has pending discharge to home in the next 1-2 days.  No TOC needs and patient will discharge home by car when medically stable to discharge.  Expected Discharge Plan: Home/Self Care Barriers to Discharge: Continued Medical Work up   Patient Goals and CMS Choice Patient states their goals for this hospitalization and ongoing recovery are:: To return home CMS Medicare.gov Compare Post Acute Care list provided to:: Other (Comment Required) (daughter, [name] at bedside) Choice offered to / list presented to : Adult Children [place] ownership interest in [facility].provided to:: Adult Children    Expected Discharge Plan and Services   Discharge Planning Services: CM Consult Post Acute Care Choice: Resumption of Svcs/PTA Provider Living arrangements for the past 2 months: Single Family Home                                      Prior Living Arrangements/Services Living arrangements for the past 2 months: Single Family Home Lives with:: Spouse Patient language and need for interpreter reviewed:: Yes Do you feel safe going back to the place where you live?: Yes      Need for Family Participation in Patient Care: Yes (Comment) Care giver support system in place?: Yes  (comment) Current home services: DME (braces, crutches, hospital bed, WC, hoyer lift, recliner, [employer], BSC,  Patient has family and private pay caregivers available at the home) Criminal Activity/Legal Involvement Pertinent to Current Situation/Hospitalization: No - Comment as needed  Activities of Daily Living      Permission Sought/Granted Permission sought to share information with : Case Manager, Family Supports Permission granted to share information with : Yes, Verbal Permission Granted        Permission granted to share info w Relationship: Kaidee Schieferstein - spouse - [phone]     Emotional Assessment Appearance:: Appears stated age Attitude/Demeanor/Rapport: Inconsistent, Gracious Affect (typically observed): Accepting Orientation: : Oriented to Self, Oriented to Place, Oriented to  Time, Oriented to Situation, Fluctuating Orientation (Suspected and/or reported Sundowners) Alcohol / Substance Use: Not Applicable Psych Involvement: No (comment)  Admission diagnosis:  Hyponatremia [E87.1] UTI (urinary tract infection) [N39.0] Acute cystitis without hematuria [N30.00] Wound of left lower extremity, initial encounter [S81.802A] Patient Active Problem List   Diagnosis Date Noted   UTI (urinary tract infection) 10/25/2022   Right knee pain 10/25/2022   Hyponatremia 10/25/2022   Leg wound, left 10/25/2022   Hyperlipidemia 10/25/2022   Obesity (BMI 30-39.9) 10/25/2022   Wheelchair dependence 05/30/2021   Ulcer of leg, chronic (HCC) 02/07/2021   Pure hypercholesterolemia 02/07/2021   Angina pectoris (HCC) 08/02/2016   Post-polio syndrome  07/20/2013            07/20/2013   Left patella fracture 07/18/2013   Hypothyroidism 03/12/2009   Controlled type 2 diabetes mellitus without complication, without long-term current use of insulin (HCC) 03/12/2009   Essential hypertension 03/12/2009   ATRIAL FIBRILLATION 03/12/2009   TAKOTSUBO SYNDROME 03/12/2009   Edema 03/12/2009    PCP:  Charlane Ferretti, DO Pharmacy:   [facility] [place], [place] - [address] [place] [address] Phone: [phone] Fax: [phone]  [facility] - [place], [place] - [address] [place] [address] Phone: [phone] Fax: [phone]     Social Determinants of Health (SDOH) Social History: SDOH Screenings   Depression [phone]): Low Risk  (05/30/2021)  Tobacco Use: Low Risk  (10/25/2022)   SDOH Interventions:     Readmission Risk Interventions    10/26/2022   12:31 PM  Readmission Risk Prevention Plan  Transportation Screening Complete  PCP or Specialist Appt within 5-7 Days Complete  Home Care Screening Complete  Medication Review (RN CM) Complete

## 2022-10-27 ENCOUNTER — Inpatient Hospital Stay (HOSPITAL_COMMUNITY): Payer: Medicare Other

## 2022-10-27 DIAGNOSIS — M7989 Other specified soft tissue disorders: Secondary | ICD-10-CM

## 2022-10-27 DIAGNOSIS — R6521 Severe sepsis with septic shock: Secondary | ICD-10-CM | POA: Diagnosis not present

## 2022-10-27 DIAGNOSIS — N10 Acute tubulo-interstitial nephritis: Secondary | ICD-10-CM

## 2022-10-27 DIAGNOSIS — A419 Sepsis, unspecified organism: Secondary | ICD-10-CM

## 2022-10-27 DIAGNOSIS — N3000 Acute cystitis without hematuria: Secondary | ICD-10-CM | POA: Diagnosis not present

## 2022-10-27 LAB — BASIC METABOLIC PANEL
Anion gap: 10 (ref 5–15)
Anion gap: 8 (ref 5–15)
Anion gap: 11 (ref 5–15)
BUN: 12 mg/dL (ref 8–23)
BUN: 12 mg/dL (ref 8–23)
BUN: 12 mg/dL (ref 8–23)
CO2: 19 mmol/L — ABNORMAL LOW (ref 22–32)
CO2: 20 mmol/L — ABNORMAL LOW (ref 22–32)
CO2: 18 mmol/L — ABNORMAL LOW (ref 22–32)
Calcium: 8.1 mg/dL — ABNORMAL LOW (ref 8.9–10.3)
Calcium: 8.1 mg/dL — ABNORMAL LOW (ref 8.9–10.3)
Calcium: 8.2 mg/dL — ABNORMAL LOW (ref 8.9–10.3)
Chloride: 91 mmol/L — ABNORMAL LOW (ref 98–111)
Chloride: 92 mmol/L — ABNORMAL LOW (ref 98–111)
Chloride: 93 mmol/L — ABNORMAL LOW (ref 98–111)
Creatinine, Ser: 0.72 mg/dL (ref 0.44–1.00)
Creatinine, Ser: 0.73 mg/dL (ref 0.44–1.00)
Creatinine, Ser: 0.6 mg/dL (ref 0.44–1.00)
GFR, Estimated: >60 mL/min (ref >60)
GFR, Estimated: >60 mL/min (ref >60)
GFR, Estimated: >60 mL/min (ref >60)
Glucose, Bld: 231 mg/dL — ABNORMAL HIGH (ref 70–99)
Glucose, Bld: 232 mg/dL — ABNORMAL HIGH (ref 70–99)
Glucose, Bld: 265 mg/dL — ABNORMAL HIGH (ref 70–99)
Potassium: 4 mmol/L (ref 3.5–5.1)
Potassium: 4 mmol/L (ref 3.5–5.1)
Potassium: 4 mmol/L (ref 3.5–5.1)
Sodium: 120 mmol/L — ABNORMAL LOW (ref 135–145)
Sodium: 120 mmol/L — ABNORMAL LOW (ref 135–145)
Sodium: 122 mmol/L — ABNORMAL LOW (ref 135–145)

## 2022-10-27 LAB — CBC WITH DIFFERENTIAL/PLATELET
Abs Immature Granulocytes: 0.15 10*3/uL — ABNORMAL HIGH (ref 0.00–0.07)
Basophils Absolute: 0.1 10*3/uL (ref 0.0–0.1)
Basophils Relative: 1 %
Eosinophils Absolute: 0.5 10*3/uL (ref 0.0–0.5)
Eosinophils Relative: 3 %
HCT: 34.3 % — ABNORMAL LOW (ref 36.0–46.0)
Hemoglobin: 11.8 g/dL — ABNORMAL LOW (ref 12.0–15.0)
Immature Granulocytes: 1 %
Lymphocytes Relative: 11 %
Lymphs Abs: 1.9 10*3/uL (ref 0.7–4.0)
MCH: 31 pg (ref 26.0–34.0)
MCHC: 34.4 g/dL (ref 30.0–36.0)
MCV: 90 fL (ref 80.0–100.0)
Monocytes Absolute: 1.5 10*3/uL — ABNORMAL HIGH (ref 0.1–1.0)
Monocytes Relative: 8 %
Neutro Abs: 13.6 10*3/uL — ABNORMAL HIGH (ref 1.7–7.7)
Neutrophils Relative %: 76 %
Platelets: 442 10*3/uL — ABNORMAL HIGH (ref 150–400)
RBC: 3.81 MIL/uL — ABNORMAL LOW (ref 3.87–5.11)
RDW: 13.3 % (ref 11.5–15.5)
WBC: 17.7 10*3/uL — ABNORMAL HIGH (ref 4.0–10.5)
nRBC: 0 % (ref 0.0–0.2)

## 2022-10-27 LAB — COMPREHENSIVE METABOLIC PANEL
ALT: 20 U/L (ref 0–44)
AST: 31 U/L (ref 15–41)
Albumin: 2.9 g/dL — ABNORMAL LOW (ref 3.5–5.0)
Alkaline Phosphatase: 71 U/L (ref 38–126)
Anion gap: 12 (ref 5–15)
BUN: 8 mg/dL (ref 8–23)
CO2: 20 mmol/L — ABNORMAL LOW (ref 22–32)
Calcium: 8.8 mg/dL — ABNORMAL LOW (ref 8.9–10.3)
Chloride: 90 mmol/L — ABNORMAL LOW (ref 98–111)
Creatinine, Ser: 0.63 mg/dL (ref 0.44–1.00)
GFR, Estimated: >60 mL/min (ref >60)
Glucose, Bld: 174 mg/dL — ABNORMAL HIGH (ref 70–99)
Potassium: 3.8 mmol/L (ref 3.5–5.1)
Sodium: 122 mmol/L — ABNORMAL LOW (ref 135–145)
Total Bilirubin: 1.1 mg/dL (ref 0.3–1.2)
Total Protein: 6.6 g/dL (ref 6.5–8.1)

## 2022-10-27 LAB — GLUCOSE, CAPILLARY
Glucose-Capillary: 204 mg/dL — ABNORMAL HIGH (ref 70–99)
Glucose-Capillary: 222 mg/dL — ABNORMAL HIGH (ref 70–99)
Glucose-Capillary: 226 mg/dL — ABNORMAL HIGH (ref 70–99)
Glucose-Capillary: 249 mg/dL — ABNORMAL HIGH (ref 70–99)
Glucose-Capillary: 214 mg/dL — ABNORMAL HIGH (ref 70–99)

## 2022-10-27 LAB — MRSA NEXT GEN BY PCR, NASAL: MRSA by PCR Next Gen: NOT DETECTED

## 2022-10-27 LAB — LACTIC ACID, PLASMA
Lactic Acid, Venous: 2.5 mmol/L (ref 0.5–1.9)
Lactic Acid, Venous: 1.8 mmol/L (ref 0.5–1.9)
Lactic Acid, Venous: 1.4 mmol/L (ref 0.5–1.9)
Lactic Acid, Venous: 1 mmol/L (ref 0.5–1.9)

## 2022-10-27 LAB — PHOSPHORUS: Phosphorus: 2.9 mg/dL (ref 2.5–4.6)

## 2022-10-27 LAB — URINE CULTURE

## 2022-10-27 LAB — SODIUM, URINE, RANDOM: Sodium, Ur: <10 mmol/L

## 2022-10-27 LAB — BLOOD GAS, VENOUS
Acid-base deficit: 2.1 mmol/L — ABNORMAL HIGH (ref 0.0–2.0)
Bicarbonate: 22.3 mmol/L (ref 20.0–28.0)
O2 Saturation: 79.7 %
Patient temperature: 37.2
pCO2, Ven: 36 mmHg — ABNORMAL LOW (ref 44–60)
pH, Ven: 7.4 (ref 7.25–7.43)
pO2, Ven: 48 mmHg — ABNORMAL HIGH (ref 32–45)

## 2022-10-27 LAB — OSMOLALITY, URINE: Osmolality, Ur: 118 mOsm/kg — ABNORMAL LOW (ref 300–900)

## 2022-10-27 LAB — BRAIN NATRIURETIC PEPTIDE: B Natriuretic Peptide: 49.7 pg/mL (ref 0.0–100.0)

## 2022-10-27 LAB — T4, FREE: Free T4: 1.54 ng/dL — ABNORMAL HIGH (ref 0.61–1.12)

## 2022-10-27 LAB — CORTISOL: Cortisol, Plasma: 12.4 ug/dL

## 2022-10-27 LAB — MAGNESIUM: Magnesium: 0.9 mg/dL — CL (ref 1.7–2.4)

## 2022-10-27 LAB — SODIUM: Sodium: 131 mmol/L — ABNORMAL LOW (ref 135–145)

## 2022-10-27 MED ORDER — IBUPROFEN 100 MG/5ML PO SUSP
400.0000 mg | Freq: Once | ORAL | Status: AC
  Administered 2022-10-27: 400 mg via ORAL
  Filled 2022-10-27: qty 20

## 2022-10-27 MED ORDER — SODIUM CHLORIDE 0.9 % IV SOLN
2.0000 g | INTRAVENOUS | Status: AC
  Administered 2022-10-27 – 2022-10-31 (×5): 2 g via INTRAVENOUS
  Filled 2022-10-27 (×5): qty 20

## 2022-10-27 MED ORDER — SODIUM CHLORIDE 0.9 % IV SOLN: INTRAVENOUS | Status: DC

## 2022-10-27 MED ORDER — SODIUM CHLORIDE 0.9 % IV SOLN: 200.0000 mg | Freq: Once | INTRAVENOUS | Status: DC

## 2022-10-27 MED ORDER — SODIUM CHLORIDE 1 G PO TABS
2.0000 g | ORAL_TABLET | Freq: Three times a day (TID) | ORAL | Status: DC
  Administered 2022-10-27: 2 g via ORAL
  Filled 2022-10-27: qty 2

## 2022-10-27 MED ORDER — SODIUM CHLORIDE 0.9 % IV SOLN: 250.0000 mL | INTRAVENOUS | Status: DC

## 2022-10-27 MED ORDER — HYDROCORTISONE SOD SUC (PF) 100 MG IJ SOLR
100.0000 mg | Freq: Three times a day (TID) | INTRAMUSCULAR | Status: DC
  Administered 2022-10-27 – 2022-10-28 (×3): 100 mg via INTRAVENOUS
  Filled 2022-10-27 (×5): qty 2

## 2022-10-27 MED ORDER — BISACODYL 10 MG RE SUPP: 10.0000 mg | Freq: Once | RECTAL | Status: DC

## 2022-10-27 MED ORDER — LEVOTHYROXINE SODIUM 100 MCG/5ML IV SOLN
100.0000 ug | Freq: Every day | INTRAVENOUS | Status: DC
  Administered 2022-10-28: 100 ug via INTRAVENOUS
  Filled 2022-10-27: qty 5

## 2022-10-27 MED ORDER — MELATONIN 3 MG PO TABS
3.0000 mg | ORAL_TABLET | Freq: Once | ORAL | Status: AC
  Administered 2022-10-27: 3 mg via ORAL
  Filled 2022-10-27: qty 1

## 2022-10-27 MED ORDER — CHLORHEXIDINE GLUCONATE CLOTH 2 % EX PADS
6.0000 | MEDICATED_PAD | Freq: Every day | CUTANEOUS | Status: DC
  Administered 2022-10-27 – 2022-11-01 (×4): 6 via TOPICAL

## 2022-10-27 MED ORDER — VANCOMYCIN HCL 1250 MG/250ML IV SOLN
1250.0000 mg | Freq: Once | INTRAVENOUS | Status: AC
  Administered 2022-10-27: 1250 mg via INTRAVENOUS
  Filled 2022-10-27 (×2): qty 250

## 2022-10-27 MED ORDER — SODIUM CHLORIDE 0.9 % IV BOLUS
1000.0000 mL | Freq: Once | INTRAVENOUS | Status: AC
  Administered 2022-10-27: 1000 mL via INTRAVENOUS

## 2022-10-27 MED ORDER — VANCOMYCIN HCL 750 MG/150ML IV SOLN
750.0000 mg | INTRAVENOUS | Status: DC
  Administered 2022-10-28: 750 mg via INTRAVENOUS
  Filled 2022-10-27: qty 150

## 2022-10-27 MED ORDER — IOHEXOL 350 MG/ML SOLN
75.0000 mL | Freq: Once | INTRAVENOUS | Status: AC | PRN
  Administered 2022-10-27: 75 mL via INTRAVENOUS

## 2022-10-27 MED ORDER — LEVOTHYROXINE SODIUM 25 MCG PO TABS: 125.0000 ug | ORAL_TABLET | Freq: Every day | ORAL | Status: DC

## 2022-10-27 MED ORDER — KETOROLAC TROMETHAMINE 15 MG/ML IJ SOLN
15.0000 mg | Freq: Once | INTRAMUSCULAR | Status: AC
  Administered 2022-10-27: 15 mg via INTRAVENOUS
  Filled 2022-10-27: qty 1

## 2022-10-27 MED ORDER — MAGNESIUM SULFATE 4 GM/100ML IV SOLN
4.0000 g | INTRAVENOUS | Status: AC
  Administered 2022-10-27: 4 g via INTRAVENOUS
  Filled 2022-10-27: qty 100

## 2022-10-27 MED ORDER — NOREPINEPHRINE 4 MG/250ML-% IV SOLN
INTRAVENOUS | Status: AC
  Administered 2022-10-27: 4 ug/min via INTRAVENOUS
  Filled 2022-10-27: qty 250

## 2022-10-27 MED ORDER — LEVOTHYROXINE SODIUM 100 MCG/5ML IV SOLN
200.0000 ug | Freq: Once | INTRAVENOUS | Status: AC
  Administered 2022-10-27: 200 ug via INTRAVENOUS
  Filled 2022-10-27: qty 10

## 2022-10-27 MED ORDER — MIDODRINE HCL 5 MG PO TABS
5.0000 mg | ORAL_TABLET | Freq: Three times a day (TID) | ORAL | Status: DC
  Administered 2022-10-28: 5 mg via ORAL
  Filled 2022-10-27 (×3): qty 1

## 2022-10-27 MED ORDER — INSULIN ASPART 100 UNIT/ML IJ SOLN
0.0000 [IU] | INTRAMUSCULAR | Status: DC
  Administered 2022-10-27 (×2): 7 [IU] via SUBCUTANEOUS
  Administered 2022-10-28 (×2): 4 [IU] via SUBCUTANEOUS

## 2022-10-27 MED ORDER — NOREPINEPHRINE 4 MG/250ML-% IV SOLN
2.0000 ug/min | INTRAVENOUS | Status: DC
  Administered 2022-10-27: 4 ug/min via INTRAVENOUS

## 2022-10-27 NOTE — Progress Notes (Signed)
Afternoon eval   Venous US results reviewed: no DVT  Uosmo now 118  uNa <10  which would still be c/w volume depletion (I wonder if earlier urine studies were error).  U osmo was reported at 255 and UNa 24.  BNP negative   Most recent Na from chemistry: up to 122 after more aggressive NaCl replacement   Plan Cont current NaCl hydration strategy  Cont steroid replacement  Follow up am chem      Simonne Martinet ACNP-BC [facility] Pulmonary/Critical Care Pager # [phone] OR # [phone] if no answer

## 2022-10-27 NOTE — Progress Notes (Addendum)
PROGRESS NOTE  [name]  ZOX:[id number] DOB: [birth year] DOA: 10/25/2022 PCP: Charlane Ferretti, DO   Brief Narrative: Patient is 77 year old female with history of hypertension, wheelchair-bound secondary to polio, hypothyroidism, nephrolithiasis, chronic back pain, GERD who presented with right knee pain.  Patient was also recently diagnosed with UTI and was on Macrobid.  Patient was having dysuria on presentation.  On presentation, she was hemodynamically stable but had soft blood pressure.  Labs showed sodium of 126.  X-ray of the pelvis and right leg did not show any fracture or dislocation.  Urinalysis shows you have positive nitrates, many bacteria.Urine culture showing Ecoli. Started on IV antibiotic.  This morning, patient became hypotensive, febrile, confused, hyponatremia worsened.  PCCM consulted, transferred to ICU  Assessment & Plan:  Principal Problem:   UTI (urinary tract infection) Active Problems:   Right knee pain   Hyponatremia   Leg wound, left   Controlled type 2 diabetes mellitus without complication, without long-term current use of insulin (HCC)   Essential hypertension   Hyperlipidemia   Hypothyroidism   Post-polio syndrome   Wheelchair dependence   Obesity (BMI 30-39.9)   Sepsis due to Escherichia coli with encephalopathy and septic shock (HCC)   Sepsis/UTI: Presented with dysuria.  Had mild leukocytosis presentation.  UA suggestive of UTI.  Was recently taking Macrobid for UTI without improvement.  Started on ceftriaxone.  Urine culture showing E. Coli. Patient became febrile, hypotensive in the early morning today.  Antibiotics broadened.  Blood culture sent. Patient was also complaining of abdominal discomfort.  Ordered CT abdomen/pelvis with contrast.  No bowel movement for last few days.  Continue bowel regimen.  Has good bowel sounds  Right knee pain/left leg wound: Her main concern was right knee pain.  Patient has history of arthritis and follows with  orthopedics and takes steroid shots.  This is most likely from osteoarthritis.  X-ray did not show any fracture or dislocation.  Continue supportive care Sustained left leg wound while trying to get on a boat last week.  Already had tetanus booster at emergency department.  No signs of infection.  Continue wound care  Hyponatremia: Baseline sodium in the 130s.  Patient was taking diuretics at home.  Started on gentle IV fluids.  Further drop in the sodium to 121.  TSH elevated.  Urine sodium in the range of 20s.  Severe hypomagnesemia: Supplemented  Diabetes type 2: Appears well-controlled on metformin.  Continue sliding scale here, monitor blood sugars  Hypertension: Hypotensive on presentation.  Monitor blood pressure.  Currently stable  Hyperlipidemia: On Lipitor  Hypothyroidism: On Synthyroid.  TSH more than 10.  Increase the dose of Synthroid to 125 mcg  History of polio/wheelchair-bound/debility: On gabapentin, Zanaflex.  Patient is nonambulatory.  Obesity: BMI 33.2          DVT prophylaxis:enoxaparin (LOVENOX) injection 40 mg Start: 10/25/22 1800     Code Status: Full Code  Family Communication: Husband/daughter at bedside  Patient status:Inpatient  Patient is from :Home  Anticipated discharge WJ:XBJY  Estimated DC date: Not sure of  Consultants: PCCM  Procedures:None  Antimicrobials:  Anti-infectives (From admission, onward)    Start     Dose/Rate Route Frequency Ordered Stop   10/28/22 1000  vancomycin (VANCOREADY) IVPB 750 mg/150 mL        750 mg 150 mL/hr over 60 Minutes Intravenous Every 24 hours 10/27/22 0448     10/27/22 0800  cefTRIAXone (ROCEPHIN) 2 g in sodium chloride 0.9 % 100 mL  IVPB        2 g 200 mL/hr over 30 Minutes Intravenous Every 24 hours 10/27/22 0422     10/27/22 0545  vancomycin (VANCOREADY) IVPB 1250 mg/250 mL        1,250 mg 166.7 mL/hr over 90 Minutes Intravenous  Once 10/27/22 0448 10/27/22 0710   10/26/22 0800  cefTRIAXone  (ROCEPHIN) 1 g in sodium chloride 0.9 % 100 mL IVPB  Status:  Discontinued        1 g 200 mL/hr over 30 Minutes Intravenous Every 24 hours 10/25/22 2117 10/27/22 0422   10/25/22 0630  cefTRIAXone (ROCEPHIN) 2 g in sodium chloride 0.9 % 100 mL IVPB        2 g 200 mL/hr over 30 Minutes Intravenous  Once 10/25/22 1478 10/25/22 0845       Subjective: Patient seen and examined at bedside.  Family members were also there.  During my evaluation, her blood pressure was soft but stable.  Earlier this morning, her blood pressure was soft, she became confused. She complains of some abdominal discomfort today.  Abdomen remains soft, mildly distended, nontender, bowel sounds are present.  Long discussion held at bedside with family about her management plan Objective: Vitals:   10/27/22 0800 10/27/22 1047 10/27/22 1053 10/27/22 1105  BP: (!) 100/58 (!) 71/30 (!) 81/42 (!) 79/41  Pulse:    69  Resp:    16  Temp:      TempSrc:      SpO2:      Weight:      Height:        Intake/Output Summary (Last 24 hours) at 10/27/2022 1156 Last data filed at 10/26/2022 2200 Gross per 24 hour  Intake 100 ml  Output 1120 ml  Net -1020 ml   Filed Weights   10/25/22 0729  Weight: 77.1 kg    Examination:    General exam: weak,lying on bed HEENT: PERRL Respiratory system:  no wheezes or crackles  Cardiovascular system: S1 & S2 heard, RRR.  Gastrointestinal system: Abdomen is mildly distended, soft and nontender.BS present Central nervous system: Alert and oriented but droswy and sleepy Extremities: Chronic bilateral lower extremity edema, contractures of lower extremities Skin: No rashes, no ulcers,no icterus     Data Reviewed: I have personally reviewed following labs and imaging studies  CBC: Recent Labs  Lab 10/25/22 0602 10/26/22 0833 10/27/22 0544  WBC 11.3* 13.2* 17.7*  NEUTROABS 5.4  --  13.6*  HGB 10.7* 12.0 11.8*  HCT 33.0* 36.6 34.3*  MCV 97.6 97.1 90.0  PLT 462* 470* 442*    Basic Metabolic Panel: Recent Labs  Lab 10/25/22 0602 10/26/22 0833 10/27/22 0506  NA 126* 121* 122*  K 4.0 4.2 3.8  CL 93* 91* 90*  CO2 22 14* 20*  GLUCOSE 157* 164* 174*  BUN 8 8 8   CREATININE 0.72 0.41* 0.63  CALCIUM 8.7* 8.5* 8.8*  MG  --   --  0.9*  PHOS  --   --  2.9     Recent Results (from the past 240 hour(s))  Urine Culture (for pregnant, neutropenic or urologic patients or patients with an indwelling urinary catheter)     Status: Abnormal (Preliminary result)   Collection Time: 10/25/22  5:33 AM   Specimen: In/Out Cath Urine  Result Value Ref Range Status   Specimen Description IN/OUT CATH URINE  Final   Special Requests NONE  Final   Culture (A)  Final    >=100,000 COLONIES/mL ESCHERICHIA  COLI CULTURE REINCUBATED FOR BETTER GROWTH Performed at [facility], [address], [place], [place] [address]    Report Status PENDING  Incomplete   Organism ID, Bacteria ESCHERICHIA COLI (A)  Final      Susceptibility   Escherichia coli - MIC*    AMPICILLIN >=32 RESISTANT Resistant     CEFAZOLIN <=4 SENSITIVE Sensitive     CEFEPIME <=0.12 SENSITIVE Sensitive     CEFTRIAXONE <=0.25 SENSITIVE Sensitive     CIPROFLOXACIN 1 RESISTANT Resistant     GENTAMICIN <=1 SENSITIVE Sensitive     IMIPENEM <=0.25 SENSITIVE Sensitive     NITROFURANTOIN 64 INTERMEDIATE Intermediate     TRIMETH/SULFA <=20 SENSITIVE Sensitive     AMPICILLIN/SULBACTAM 16 INTERMEDIATE Intermediate     PIP/TAZO <=4 SENSITIVE Sensitive     * >=100,000 COLONIES/mL ESCHERICHIA COLI  MRSA Next Gen by PCR, Nasal     Status: None   Collection Time: 10/27/22  5:42 AM   Specimen: Nasal Mucosa; Nasal Swab  Result Value Ref Range Status   MRSA by PCR Next Gen NOT DETECTED NOT DETECTED Final    Comment: (NOTE) The GeneXpert MRSA Assay (FDA approved for NASAL specimens only), is one component of a comprehensive MRSA colonization surveillance program. It is not intended to diagnose MRSA infection nor to  guide or monitor treatment for MRSA infections. Test performance is not FDA approved in patients less than 29 years old. Performed at [facility], [address], [place], [place] [address]      Radiology Studies: DG CHEST PORT 1 VIEW  Result Date: 10/27/2022 CLINICAL DATA:  [id number] with fever. EXAM: PORTABLE CHEST 1 VIEW COMPARISON:  Portable chest 08/30/2022 FINDINGS: 4:48 a.m. The heart is enlarged. There are no findings of acute CHF. The mediastinum is stable. There is aortic tortuosity and atherosclerosis. There is no substantial pleural effusion. There is a low inspiration with increased opacity in the right infrahilar area and retrocardiac left base which could be due to atelectasis or pneumonia. The remaining lungs are clear, with chronic mild elevation of the right diaphragm. There is osteopenia, dextroscoliosis and degenerative change of the spine and shoulders. IMPRESSION: 1. Increased opacity in the right infrahilar area and retrocardiac left base which could be due to atelectasis or pneumonia. 2. Low inspiration study. Follow-up study recommended in full inspiration. 3. Cardiomegaly without evidence of acute CHF. 4. Aortic atherosclerosis. Electronically Signed   By: Almira Bar M.D.   On: 10/27/2022 05:19    Scheduled Meds:  atorvastatin  40 mg Oral Daily   bisacodyl  10 mg Rectal Once   enoxaparin (LOVENOX) injection  40 mg Subcutaneous Q24H   hydrocortisone sod succinate (SOLU-CORTEF) inj  100 mg Intravenous Q8H   insulin aspart  0-6 Units Subcutaneous TID WC   [START ON 10/28/2022] levothyroxine  100 mcg Intravenous Daily   levothyroxine  200 mcg Intravenous Once   lidocaine  1 patch Transdermal Q24H   midodrine  5 mg Oral TID WC   mirabegron ER  50 mg Oral QODAY   pantoprazole  40 mg Oral Daily   polyethylene glycol  17 g Oral Daily   senna  1 tablet Oral BID   Continuous Infusions:  sodium chloride 100 mL/hr at 10/27/22 1031   sodium chloride     cefTRIAXone  (ROCEPHIN)  IV     norepinephrine (LEVOPHED) Adult infusion     sodium chloride     [START ON 10/28/2022] vancomycin  LOS: 2 days   Burnadette Pop, MD [facility] P6/14/2024, 11:56 AM

## 2022-10-27 NOTE — Care Management Important Message (Signed)
Important Message  Patient Details  Name: NAMIAH KALLIN MRN: [id number] Date of Birth: [birth year]   Medicare Important Message Given:  Yes  Patient left prior to IM delivery will mal a copy to the patient home address.    Renaye Janicki 10/27/2022, 3:42 PM

## 2022-10-27 NOTE — Progress Notes (Signed)
   10/27/22 1554  Spiritual Encounters  Type of Visit Initial  Care provided to: Pt and family  Conversation partners present during encounter Nurse  Referral source Nurse (RN/NT/LPN)  Reason for visit Urgent spiritual support  OnCall Visit No  Spiritual Framework  Presenting Themes Meaning/purpose/sources of inspiration;Impactful experiences and emotions;Values and beliefs  Community/Connection Family  Patient Stress Factors Health changes  Family Stress Factors Exhausted  Interventions  Spiritual Care Interventions Made Established relationship of care and support;Compassionate presence;Reflective listening;Normalization of emotions;Narrative/life review;Explored values/beliefs/practices/strengths;Prayer;Encouragement  Intervention Outcomes  Outcomes Connection to spiritual care;Awareness around self/spiritual resourses;Awareness of support;Reduced anxiety;Reduced isolation;Patient family open to resources  Spiritual Care Plan  Spiritual Care Issues Still Outstanding No further spiritual care needs at this time (see row info)   Patient's daughter requested a visit from chaplains. Chaplain provided spiritual and emotional support for patient's daughters, husband and brother. Chaplain met separately with daughter Callie Fielding. No further spiritual care needs at this time.   Arlyce Dice, Chaplain Resident [phone]

## 2022-10-27 NOTE — Progress Notes (Addendum)
Went into patient's room at [phone] due to a BP of 83/44. MD requested manual BP. Manual BP was 100/58. Patient was disoriented x4, while husband was at bedside. Patient complained of difficulty to urinate. No output of urine was observed. MD ordered a foley and foley 14 Fr was placed. Patient had an output of . Fluids were ordered to increase from 50 to 100 ml. ICU NP came by the room and stated that because patient's blood pressure and sodium was low, she would possibly be sent to ICU. No success from increase of fluids. NP ordered a bolus and MD ordered a transfer for ICU. Report was given to ICU nurse. Patient had magnesium going with only one available IV. After multiple failed attempts at IV and IV nurse came in and started one with ultrasound. Bolus, magnesium and rocephin was started before patient was sent to ICU. Before patient left the floor, she was oriented to self and situation. Patient has been transferred to ICU; due to low and unstable BP and HR.  Report has been given to nurse and transferred has been complete. All questions have been answered for ICU nurse and family.    Lawana Pai, RN

## 2022-10-27 NOTE — Progress Notes (Signed)
MD notified of pt's continued AMS since receiving dilaudid x1 for pain management on 6/13. Pt on tele and has new tachycardia in the 110's. Morning vitals obtained with new fever of 103F. Pt is lethargic, arouses to verbal stimuli but drifts off to sleep. Pt with urinary retention during dayshift requiring in/out cath x1. Current bladder scan reveals 257cc.

## 2022-10-27 NOTE — Progress Notes (Signed)
Received a call from bedside RN regarding the patient having an acute change in mental status.  Associated with a fever with Tmax of 103, and tachycardia in the 110s.  Presented at bedside.  The patient is lethargic and arouses to verbal stimuli.  She is confused, does not respond to questions appropriately and drifts off to sleep.  Due to concern for sepsis, peripheral blood cultures were obtained x 2, lactic acid, chest x-ray and routine labs.  Due to hypersomnolence venous blood gas was obtained to assess her pH and pCO2 levels.  Examined the patient at bedside.  Added MRSA screening test and MRSA coverage, IV vancomycin, to broaden coverage, for left lower extremity wound.  DC IV vancomycin if MRSA screen test is negative.  Urine culture returned positive for E. coli, sensitivities are pending.  The patient is currently on Rocephin empirically.  Salt tablets 2 g 3 times daily x 3 doses ordered for worsening serum sodium 121 from 126.  Repeat BMP is pending at the time of this dictation.  Judicious IV fluid hydration, NS at 50 cc/h x 6 hours due to lower extremity edema in the setting of hypoalbuminemia 2.6.  Updated the patient's husband at bedside.    We will continue to closely monitor and treat as indicated.   Time: 20 minutes.

## 2022-10-27 NOTE — Progress Notes (Signed)
eLink Physician-Brief Progress Note Patient Name: [name] DOB: 05-27-1945 MRN: [id number]   Date of Service  10/27/2022  HPI/Events of Note  Patient asking for something for sleep and pain. She has 3+ extremity edema and has put out > 2 liters of urine.  eICU Interventions  NS gtt rate reduced to 75 ml / hour. Toradol 15 mg iv x 1, Melatonin 3 mg po x 1.        [name] 10/27/2022, 10:28 PM

## 2022-10-27 NOTE — Progress Notes (Signed)
Pharmacy Antibiotic Note  [name] is a 77 y.o. female admitted on 10/25/2022 with sepsis.  Pharmacy has been consulted for Vancomycin dosing. WBC mildly elevated. Renal function ok.   Plan: Vancomycin 750 mg IV q24h >>>Estimated AUC: 500 Ceftriaxone per MD Trend WBC, temp, renal function  F/U infectious work-up Drug levels as indicated   Height: 5' (152.4 cm) Weight: 77.1 kg (170 lb) IBW/kg (Calculated) : 45.5  Temp (24hrs), Avg:99.9 F (37.7 C), Min:98 F (36.7 C), Max:103 F (39.4 C)  Recent Labs  Lab 10/25/22 0602 10/26/22 0833  WBC 11.3* 13.2*  CREATININE 0.72 0.41*    Estimated Creatinine Clearance: 53.2 mL/min (A) (by C-G formula based on SCr of 0.41 mg/dL (L)).    Allergies  Allergen Reactions   Tamsulosin Other (See Comments)    Caused uncontrollable movements   Ciprofloxacin Rash   Carisprodol [Carisoprodol] Itching   Penicillins Itching   Sulfa Antibiotics Other (See Comments)    Unknown.  States as always told had an allergy   Acetaminophen Itching   Macrodantin [Nitrofurantoin Macrocrystal] Rash   Nsaids Other (See Comments)    Chest pain and stomach pain   Percocet [Oxycodone-Acetaminophen] Itching   Propoxyphene Other (See Comments)    Chest/stomach pain   Propoxyphene N-Acetaminophen Other (See Comments)    Chest/stomach pain    Abran Duke, PharmD, BCPS Clinical Pharmacist Phone: [phone]

## 2022-10-27 NOTE — Progress Notes (Signed)
Right lower extremity venous duplex has been completed. Preliminary results can be found in CV Proc through chart review.   10/27/22 4:27 PM Olen Cordial RVT

## 2022-10-27 NOTE — Consult Note (Addendum)
NAME:  [name], MRN:  [id number], DOB:  [birth year], LOS: 2 ADMISSION DATE:  10/25/2022, CONSULTATION DATE:  6/14 REFERRING MD:  Renford Dills, CHIEF COMPLAINT:  sepsis    History of Present Illness:  77-year-old female patient who was admitted from home on 6/12 with chief complaint of worsening lower leg discomfort, involving the right leg, also had a new wound of the left lower extremity after trying to get on a boat during family vacation.  They had been caring for the wound has instructed at home.  She did receive a tetanus shot.  Also recently diagnosed with a urinary tract infection for which she completed a 10-day course of Macrobid. In the emergency room her heart rate was in the 40s to 50s blood pressure 95 systolic sodium initially 126 she had an x-ray of the pelvis and right leg which was negative for fractures urinalysis suggested urinary tract infection and she was admitted with working diagnosis of UTI, hyponatremia in addition to left lower extremity wound.  On 6/13 preliminary cultures were showing E. coli in urine, this was sensitive to ceftriaxone, and urine sodium had dropped from 1 26-1 21.  Because of this Her fluids were restricted, then serum osmolality was checked this was 258, urine osmolality was also checked this was 255 urine sodium 24.  Her TSH came back at 14.4.    Pertinent  Medical History  Polio, wheelchair-bound.  Hypertension, hypothyroidism, nephrolithiasis, arthritis, chronic back pain, GERD. Significant Hospital Events: Including procedures, antibiotic start and stop dates in addition to other pertinent events   6/12 admitted w/ UTI  6/13 Na low TSH > 14 6/14 fevers, hypotensive. Decreased MS. Started stress dose steroids. NE infusion, change thyroid to IV   Interim History / Subjective:  Now hypotensive and lethargic   Objective   Blood pressure (Abnormal) 100/58, pulse 85, temperature 99 F (37.2 C), temperature source Oral, resp. rate 18, height 5'  (1.524 m), weight 77.1 kg, SpO2 95 %.        Intake/Output Summary (Last 24 hours) at 10/27/2022 1039 Last data filed at 10/26/2022 2200 Gross per 24 hour  Intake 100 ml  Output 1120 ml  Net -1020 ml   Filed Weights   10/25/22 0729  Weight: 77.1 kg    Examination: General: obese 77 year old female lethargic HENT: NCAT no JVD  Lungs: dec bases  Cardiovascular: RRR    Abdomen: soft  Extremities: RLE swollen and edematous warm to touch. LLE w/ dressing intact Neuro: opens eyes. Will follow commands. Sp slurred oriented x 1 GU: cl yellow   Resolved Hospital Problem list     Assessment & Plan:  Septic shock 2/2 e-coli UTI Plan IV fluid challenge Cont ctx, vanc added last night. 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  Suspect mixed picture here: sepsis, hypothyroidism, residual narcotics/sedating meds. Hypothyroidism may also be playing a role  Plan Treat sepsis Treat hypothyroidism Hold sedating meds (including her zanaflex and gabapentin)   Hypovolemic hypoosmolar hyponatremia  Plan NS bolus  Serial labs Stopping salt tabs  RLE pain (new) She is immobile at baseline Plan RLE [place]   Left leg wound Plan Wound care as  outlined   Type II DM w/ hyperglycemia Plan Ssi   Best Practice (right click and "Reselect all SmartList Selections" daily)   Diet/type: NPO w/ oral meds DVT prophylaxis: LMWH GI prophylaxis: N/A Lines: N/A Foley:  Yes, and it is still needed Code Status:  full code Last date of multidisciplinary goals of care discussion [pending]  Labs   CBC: Recent Labs  Lab 10/25/22 0602 10/26/22 0833 10/27/22 0544  WBC 11.3* 13.2* 17.7*   NEUTROABS 5.4  --  13.6*  HGB 10.7* 12.0 11.8*  HCT 33.0* 36.6 34.3*  MCV 97.6 97.1 90.0  PLT 462* 470* 442*    Basic Metabolic Panel: Recent Labs  Lab 10/25/22 0602 10/26/22 0833 10/27/22 0506  NA 126* 121* 122*  K 4.0 4.2 3.8  CL 93* 91* 90*  CO2 22 14* 20*  GLUCOSE 157* 164* 174*  BUN 8 8 8   CREATININE 0.72 0.41* 0.63  CALCIUM 8.7* 8.5* 8.8*  MG  --   --  0.9*  PHOS  --   --  2.9   GFR: Estimated Creatinine Clearance: 53.2 mL/min (by C-G formula based on SCr of 0.63 mg/dL). Recent Labs  Lab 10/25/22 0602 10/26/22 0833 10/27/22 0506 10/27/22 0544 10/27/22 0758  WBC 11.3* 13.2*  --  17.7*  --   LATICACIDVEN  --   --  2.5*  --  1.8    Liver Function Tests: Recent Labs  Lab 10/25/22 0602 10/27/22 0506  AST 19 31  ALT 15 20  ALKPHOS 60 71  BILITOT 0.6 1.1  PROT 5.7* 6.6  ALBUMIN 2.6* 2.9*   No results for input(s): "LIPASE", "AMYLASE" in the last 168 hours. No results for input(s): "AMMONIA" in the last 168 hours.  ABG    Component Value Date/Time   HCO3 22.3 10/27/2022 0542   TCO2 30 08/30/2022 1859   ACIDBASEDEF 2.1 (H) 10/27/2022 0542   O2SAT 79.7 10/27/2022 0542     Coagulation Profile: No results for input(s): "INR", "PROTIME" in the last 168 hours.  Cardiac Enzymes: No results for input(s): "CKTOTAL", "CKMB", "CKMBINDEX", "TROPONINI" in the last 168 hours.  HbA1C: Hgb A1c MFr Bld  Date/Time Value Ref Range Status  10/26/2022 04:16 AM 5.7 (H) 4.8 - 5.6 % Final    Comment:    (NOTE) Pre diabetes:          5.7%-6.4%  Diabetes:              >6.4%  Glycemic control for   <7.0% adults with diabetes   06/05/2010 07:20 AM  <5.7 % Final   5.2 (NOTE)                                                                       According to the ADA Clinical Practice Recommendations for 2011, when HbA1c is used as a screening test:   >=6.5%   Diagnostic of Diabetes Mellitus           (if abnormal result  is confirmed)  5.7-6.4%   Increased risk of  developing Diabetes Mellitus  References:Diagnosis and Classification of Diabetes Mellitus,Diabetes Care,2011,34(Suppl 1):S62-S69 and Standards of Medical Care in         Diabetes - 2011,Diabetes Care,2011,34  (Suppl 1):S11-S61.  CBG: Recent Labs  Lab 10/26/22 0758 10/26/22 1133 10/26/22 1654 10/26/22 2057 10/27/22 0805  GLUCAP 193* 163* 143* 129* 204*    Review of Systems:   Not able   Past Medical History:  She,  has a past medical history of Angina pectoris (HCC) (08/02/2016), Anxiety, Arthritis, Asthma, CHF (congestive heart failure) (HCC), Chronic back pain, Chronic lower back pain, Family history of anesthesia complication, GERD (gastroesophageal reflux disease), H/O hiatal hernia, Headache(784.0), Hypertension, Hypothyroidism, Insomnia, Kidney stones, Memory changes, Mental disorder, Migraine, Osteopenia, Post-polio syndrome, Pure hypercholesterolemia (02/07/2021), Sleep apnea, Takotsubo cardiomyopathy, Type II diabetes mellitus (HCC), and Ulcer of leg, chronic (HCC) (02/07/2021).   Surgical History:   Past Surgical History:  Procedure Laterality Date   ABDOMINAL HYSTERECTOMY     APPENDECTOMY     CARPAL TUNNEL RELEASE Bilateral    CHOLECYSTECTOMY     COLONOSCOPY     CYSTOSCOPY     IR INJECT/THERA/INC NEEDLE/CATH/PLC EPI/LUMB/SAC W/IMG  07/19/2022   IR INJECT/THERA/INC NEEDLE/CATH/PLC EPI/LUMB/SAC W/IMG  09/11/2022   KNEE ARTHROSCOPY Left    KNEE SURGERY Left    LEG SURGERY Left    polio   ORIF PATELLA Left 07/18/2013   Procedure: OPEN REDUCTION INTERNAL (ORIF) LEFT FIXATION PATELLA;  Surgeon: Verlee Rossetti, MD;  Location: [facility] OR;  Service: Orthopedics;  Laterality: Left;   ORIF PATELLA FRACTURE Left 07/18/2013   TUBAL LIGATION     WISDOM TOOTH EXTRACTION     WRIST TENDON TRANSFER Right      Social History:   reports that she has never smoked. She has never used smokeless tobacco. She reports that she does not drink alcohol and does not use drugs.   Family History:   Her family history includes Allergies in her grandchild; Breast cancer in her maternal grandmother; Cancer in her father; Diabetes in her father; Heart failure in her father and mother; Parkinson's disease in her mother.   Allergies Allergies  Allergen Reactions   Tamsulosin Other (See Comments)    Caused uncontrollable movements   Ciprofloxacin Rash   Carisprodol [Carisoprodol] Itching   Penicillins Itching   Sulfa Antibiotics Other (See Comments)    Unknown.  States as always told had an allergy   Acetaminophen Itching   Macrodantin [Nitrofurantoin Macrocrystal] Rash   Nsaids Other (See Comments)    Chest pain and stomach pain   Percocet [Oxycodone-Acetaminophen] Itching   Propoxyphene Other (See Comments)    Chest/stomach pain   Propoxyphene N-Acetaminophen Other (See Comments)    Chest/stomach pain     Home Medications  Prior to Admission medications   Medication Sig Start Date End Date Taking? Authorizing Provider  amitriptyline (ELAVIL) 25 MG tablet Take 25 mg by mouth at bedtime.   Yes [provider]  ascorbic acid (VITAMIN C) 500 MG tablet Take 500 mg by mouth daily.   Yes [provider]  aspirin 81 MG tablet Take 81 mg by mouth at bedtime.    Yes [provider]  atorvastatin (LIPITOR) 40 MG tablet Take 40 mg by mouth daily.   Yes [provider]  b complex vitamins tablet Take 1 tablet by mouth daily.   Yes [provider]  carvedilol (COREG) 25 MG tablet Take 25 mg by mouth 2 (two) times daily with a meal.   Yes [provider]  clonazePAM (KLONOPIN) 0.5 MG tablet Take 0.5 mg by mouth 2 (two) times daily.   Yes [provider]  gabapentin (NEURONTIN) 300 MG capsule Take 300 mg by  mouth at bedtime.   Yes [provider]  Iron, Ferrous Sulfate, 325 (65 Fe) MG TABS Take 1 tablet by mouth daily.   Yes [provider]  levothyroxine (SYNTHROID, LEVOTHROID) 100 MCG tablet Take 100 mcg by mouth  daily before breakfast.   Yes [provider]  loratadine (CLARITIN) 10 MG tablet Take 10 mg by mouth daily. 12/17/19  Yes [provider]  metFORMIN (GLUCOPHAGE-XR) 500 MG 24 hr tablet Take 1 tablet by mouth in the morning and at bedtime.  12/24/18  Yes [provider]  mirabegron ER (MYRBETRIQ) 50 MG TB24 tablet Take 50 mg by mouth daily.   Yes [provider]  Multiple Vitamins-Minerals (MULTIVITAMIN WITH MINERALS) tablet Take 1 tablet by mouth daily.   Yes [provider]  nitroGLYCERIN (NITROSTAT) 0.4 MG SL tablet Place 1 tablet (0.4 mg total) under the tongue every 5 (five) minutes as needed for chest pain. 08/02/21  Yes Alver Sorrow, NP  pantoprazole (PROTONIX) 40 MG tablet Take 1 tablet by mouth daily. Take 1 tab daily 12/21/14  Yes [provider]  telmisartan (MICARDIS) 80 MG tablet Take 1 tablet by mouth daily. Take 1 tab daily 12/14/14  Yes [provider]  tiZANidine (ZANAFLEX) 4 MG tablet Take 4 mg by mouth 5 (five) times daily as needed for muscle spasms. PAIN   Yes [provider]  torsemide (DEMADEX) 5 MG tablet Take 5 mg by mouth daily.   Yes [provider]  Vitamin D, Ergocalciferol, (DRISDOL) 1.25 MG (50000 UNIT) CAPS capsule Take 50,000 Units by mouth every 7 (seven) days.   Yes [provider]  vitamin E 180 MG (400 UNITS) capsule Take 400 Units by mouth daily.   Yes [provider]  Zinc 50 MG TABS Take 50 mg by mouth daily.   Yes [provider]  zolpidem (AMBIEN) 5 MG tablet Take 5 mg by mouth at bedtime as needed for sleep.   Yes [provider]  spironolactone (ALDACTONE) 25 MG tablet TAKE 1/2 TABLET DAILY Patient not taking: Reported on 10/25/2022 05/26/22   Chilton Si, MD  traMADol (ULTRAM) 50 MG tablet Take 1 tablet (50 mg total) by mouth every 6 (six) hours as needed. Patient not taking: Reported on 08/30/2022 06/04/22   Lorre Nick, MD      Critical care time: 40 minutes

## 2022-10-28 DIAGNOSIS — A419 Sepsis, unspecified organism: Secondary | ICD-10-CM | POA: Diagnosis not present

## 2022-10-28 DIAGNOSIS — G9340 Encephalopathy, unspecified: Secondary | ICD-10-CM

## 2022-10-28 LAB — GLUCOSE, CAPILLARY
Glucose-Capillary: 176 mg/dL — ABNORMAL HIGH (ref 70–99)
Glucose-Capillary: 171 mg/dL — ABNORMAL HIGH (ref 70–99)
Glucose-Capillary: 171 mg/dL — ABNORMAL HIGH (ref 70–99)
Glucose-Capillary: 130 mg/dL — ABNORMAL HIGH (ref 70–99)
Glucose-Capillary: 137 mg/dL — ABNORMAL HIGH (ref 70–99)

## 2022-10-28 LAB — BASIC METABOLIC PANEL
Anion gap: 10 (ref 5–15)
BUN: 9 mg/dL (ref 8–23)
CO2: 21 mmol/L — ABNORMAL LOW (ref 22–32)
Calcium: 8.4 mg/dL — ABNORMAL LOW (ref 8.9–10.3)
Chloride: 100 mmol/L (ref 98–111)
Creatinine, Ser: 0.44 mg/dL (ref 0.44–1.00)
GFR, Estimated: >60 mL/min (ref >60)
Glucose, Bld: 179 mg/dL — ABNORMAL HIGH (ref 70–99)
Potassium: 3.3 mmol/L — ABNORMAL LOW (ref 3.5–5.1)
Sodium: 131 mmol/L — ABNORMAL LOW (ref 135–145)

## 2022-10-28 LAB — SODIUM
Sodium: 129 mmol/L — ABNORMAL LOW (ref 135–145)
Sodium: 128 mmol/L — ABNORMAL LOW (ref 135–145)

## 2022-10-28 LAB — URINE CULTURE: Culture: >=100000 — AB

## 2022-10-28 LAB — MAGNESIUM: Magnesium: 1.7 mg/dL (ref 1.7–2.4)

## 2022-10-28 LAB — T3, FREE: T3, Free: 1.6 pg/mL — ABNORMAL LOW (ref 2.0–4.4)

## 2022-10-28 MED ORDER — INSULIN ASPART 100 UNIT/ML IJ SOLN: 0.0000 [IU] | Freq: Every day | INTRAMUSCULAR | Status: DC

## 2022-10-28 MED ORDER — KETOROLAC TROMETHAMINE 15 MG/ML IJ SOLN
15.0000 mg | Freq: Three times a day (TID) | INTRAMUSCULAR | Status: AC | PRN
  Administered 2022-10-28 – 2022-10-29 (×3): 15 mg via INTRAVENOUS
  Filled 2022-10-28 (×3): qty 1

## 2022-10-28 MED ORDER — POTASSIUM CHLORIDE 10 MEQ/100ML IV SOLN
10.0000 meq | INTRAVENOUS | Status: AC
  Administered 2022-10-28 (×6): 10 meq via INTRAVENOUS
  Filled 2022-10-28 (×2): qty 100

## 2022-10-28 MED ORDER — MAGNESIUM SULFATE 2 GM/50ML IV SOLN
2.0000 g | Freq: Once | INTRAVENOUS | Status: AC
  Administered 2022-10-28: 2 g via INTRAVENOUS
  Filled 2022-10-28: qty 50

## 2022-10-28 MED ORDER — AMITRIPTYLINE HCL 50 MG PO TABS
25.0000 mg | ORAL_TABLET | Freq: Every day | ORAL | Status: DC
  Administered 2022-10-28 – 2022-10-31 (×4): 25 mg via ORAL
  Filled 2022-10-28 (×5): qty 1

## 2022-10-28 MED ORDER — INSULIN ASPART 100 UNIT/ML IJ SOLN
0.0000 [IU] | Freq: Three times a day (TID) | INTRAMUSCULAR | Status: DC
  Administered 2022-10-28: 3 [IU] via SUBCUTANEOUS
  Administered 2022-10-29: 4 [IU] via SUBCUTANEOUS
  Administered 2022-10-29: 3 [IU] via SUBCUTANEOUS
  Administered 2022-10-29: 4 [IU] via SUBCUTANEOUS
  Administered 2022-10-30 (×3): 3 [IU] via SUBCUTANEOUS
  Administered 2022-10-31: 5 [IU] via SUBCUTANEOUS
  Administered 2022-10-31: 4 [IU] via SUBCUTANEOUS
  Administered 2022-10-31 – 2022-11-01 (×2): 3 [IU] via SUBCUTANEOUS

## 2022-10-28 MED ORDER — CARVEDILOL 12.5 MG PO TABS
12.5000 mg | ORAL_TABLET | Freq: Two times a day (BID) | ORAL | Status: DC
  Administered 2022-10-28 – 2022-11-01 (×8): 12.5 mg via ORAL
  Filled 2022-10-28 (×8): qty 1

## 2022-10-28 MED ORDER — LIP MEDEX EX OINT
TOPICAL_OINTMENT | CUTANEOUS | Status: DC | PRN
  Filled 2022-10-28: qty 7

## 2022-10-28 MED ORDER — ZOLPIDEM TARTRATE 5 MG PO TABS
5.0000 mg | ORAL_TABLET | Freq: Every day | ORAL | Status: DC
  Administered 2022-10-29 – 2022-10-30 (×2): 5 mg via ORAL
  Filled 2022-10-28 (×3): qty 1

## 2022-10-28 MED ORDER — GABAPENTIN 100 MG PO CAPS
100.0000 mg | ORAL_CAPSULE | Freq: Every day | ORAL | Status: DC
  Administered 2022-10-28 – 2022-10-31 (×4): 100 mg via ORAL
  Filled 2022-10-28 (×4): qty 1

## 2022-10-28 MED ORDER — LEVOTHYROXINE SODIUM 100 MCG PO TABS
100.0000 ug | ORAL_TABLET | Freq: Every day | ORAL | Status: DC
  Administered 2022-10-29 – 2022-11-01 (×4): 100 ug via ORAL
  Filled 2022-10-28 (×5): qty 1

## 2022-10-28 MED ORDER — MELATONIN 3 MG PO TABS
3.0000 mg | ORAL_TABLET | Freq: Once | ORAL | Status: AC
  Administered 2022-10-28: 3 mg via ORAL
  Filled 2022-10-28: qty 1

## 2022-10-28 NOTE — Progress Notes (Signed)
eLink Physician-Brief Progress Note Patient Name: [name] DOB: 01-08-1946 MRN: [id number]   Date of Service  10/28/2022  HPI/Events of Note  BSRN is worried about giving ambien to this pt with her ongoing confusion, asking for melatonin PO  eICU Interventions  Melatonin ordered     Intervention Category Minor Interventions: Other:  Ranee Gosselin 10/28/2022, 9:34 PM  02;25 Tylenol ordered for knee pain, has itching as allergy. At home takes trazodone. Encephalopathy- so avoiding same now for pain. Has DM also.  Try Neurontin low dose. Did receive 100 mg at 10 PM. On ketorolac prn also. Cr normal.

## 2022-10-28 NOTE — Progress Notes (Signed)
[facility] ADULT ICU REPLACEMENT PROTOCOL   The patient does apply for the [facility] Adult ICU Electrolyte Replacment Protocol based on the criteria listed below:   1.Exclusion criteria: TCTS, ECMO, Dialysis, and Myasthenia Gravis patients 2. Is GFR >/= 30 ml/min? Yes.    Patient's GFR today is >60 3. Is SCr </= 2? Yes.   Patient's SCr is 0.44 mg/dL 4. Did SCr increase >/= 0.5 in 24 hours? No. 5.Pt's weight >40kg  Yes.   6. Abnormal electrolyte(s): Potassium 3.3, Magnesium 1.7  7. Electrolytes replaced per protocol 8.  Call MD STAT for K+ </= 2.5, Phos </= 1, or Mag </= 1 Physician:  Dr. Namon Cirri A Zaedyn Covin 10/28/2022 5:52 AM

## 2022-10-28 NOTE — Progress Notes (Signed)
NAME:  [name], MRN:  [id number], DOB:  05/21/1945, LOS: 3 ADMISSION DATE:  10/25/2022, CONSULTATION DATE:  6/14 REFERRING MD:  Renford Dills, CHIEF COMPLAINT:  sepsis    History of Present Illness:  77-year-old female patient who was admitted from home on 6/12 with chief complaint of worsening lower leg discomfort, involving the right leg, also had a new wound of the left lower extremity after trying to get on a boat during family vacation.  They had been caring for the wound has instructed at home.  She did receive a tetanus shot.  Also recently diagnosed with a urinary tract infection for which she completed a 10-day course of Macrobid. In the emergency room her heart rate was in the 40s to 50s blood pressure 95 systolic sodium initially 126 she had an x-ray of the pelvis and right leg which was negative for fractures urinalysis suggested urinary tract infection and she was admitted with working diagnosis of UTI, hyponatremia in addition to left lower extremity wound.  On 6/13 preliminary cultures were showing E. coli in urine, this was sensitive to ceftriaxone, and urine sodium had dropped from 1 26-1 21.  Because of this Her fluids were restricted, then serum osmolality was checked this was 258, urine osmolality was also checked this was 255 urine sodium 24.  Her TSH came back at 14.4.    Pertinent  Medical History  Polio, wheelchair-bound.  Hypertension, hypothyroidism, nephrolithiasis, arthritis, chronic back pain, GERD. Significant Hospital Events: Including procedures, antibiotic start and stop dates in addition to other pertinent events   6/12 admitted w/ UTI  6/13 Na low TSH > 14 6/14 fevers, hypotensive. Decreased MS. Started stress dose steroids. NE infusion, change thyroid to IV  6/15 off pressors   Interim History / Subjective:  Off rpessors   Objective   Blood pressure (!) 128/52, pulse 70, temperature 97.8 F (36.6 C), temperature source Oral, resp. rate 13, height 5' (1.524  m), weight 102.9 kg, SpO2 91 %.        Intake/Output Summary (Last 24 hours) at 10/28/2022 0707 Last data filed at 10/28/2022 0600 Gross per 24 hour  Intake 2781.22 ml  Output 3825 ml  Net -1043.78 ml   Filed Weights   10/25/22 0729 10/27/22 1237  Weight: 77.1 kg 102.9 kg    Examination: General:  chronically ill elderly F NAD  HENT:  NCAT pink mm  Lungs: even unlabored  Cardiovascular:  Rrr   Abdomen: soft ndnt  Extremities:  RLE edema  Neuro: Awake alert oriented x 2 GU: yellow urine   Resolved Hospital Problem list     Assessment & Plan:   Acute encephalopathy, improving   -septic shock, hypoperfusion w shock, dehydration P -delirium precautions -tx underlying cause (think sepsis more so than Na, BP resolved)   Septic shock from E coli  and Aerococcus urinae UTI/pyelonephritis +/- component of hypovolemic shock  -urine studies for HypoNa would suggest volume depletion  P -Wean NE for MAP > 65  -cont rocephin  -dc vanc -dc solucortef   Hypothyroidism  -think TSH is this elevated in context of infection, not necessarily reflection of poor control  P -synthroid  -recheck TSH later this admit   Hyponatremia - hypovolemic  Hypomagnesemia  Hypokalemia  P -NS decr to 75 overnight, cont -replace Mag, K  -repeat Na 6/15 afternoon, AM BMP   LLE wound., non purulent   RLE edema  -no DVT RLE  P -WOC   DM2 w hyperglycemia  P -SSI  Best Practice (right click and "Reselect all SmartList Selections" daily)   Diet/type: Regular consistency (see orders) DVT prophylaxis: LMWH GI prophylaxis: N/A Lines: N/A Foley:  Yes, and it is still needed Code Status:  full code Last date of multidisciplinary goals of care discussion [pending]  Labs   CBC: Recent Labs  Lab 10/25/22 0602 10/26/22 0833 10/27/22 0544  WBC 11.3* 13.2* 17.7*  NEUTROABS 5.4  --  13.6*  HGB 10.7* 12.0 11.8*  HCT 33.0* 36.6 34.3*  MCV 97.6 97.1 90.0  PLT 462* 470* 442*    Basic  Metabolic Panel: Recent Labs  Lab 10/27/22 0506 10/27/22 1137 10/27/22 1138 10/27/22 1556 10/27/22 2224 10/28/22 0428  NA 122* 120* 120* 122* 131* 131*  K 3.8 4.0 4.0 4.0  --  3.3*  CL 90* 92* 91* 93*  --  100  CO2 20* 20* 19* 18*  --  21*  GLUCOSE 174* 231* 232* 265*  --  179*  BUN 8 12 12 12   --  9  CREATININE 0.63 0.72 0.73 0.60  --  0.44  CALCIUM 8.8* 8.1* 8.1* 8.2*  --  8.4*  MG 0.9*  --   --   --   --  1.7  PHOS 2.9  --   --   --   --   --    GFR: Estimated Creatinine Clearance: 61.7 mL/min (by C-G formula based on SCr of 0.44 mg/dL). Recent Labs  Lab 10/25/22 0602 10/26/22 0833 10/27/22 0506 10/27/22 0544 10/27/22 0758 10/27/22 1250 10/27/22 1556  WBC 11.3* 13.2*  --  17.7*  --   --   --   LATICACIDVEN  --   --  2.5*  --  1.8 1.4 1.0    Liver Function Tests: Recent Labs  Lab 10/25/22 0602 10/27/22 0506  AST 19 31  ALT 15 20  ALKPHOS 60 71  BILITOT 0.6 1.1  PROT 5.7* 6.6  ALBUMIN 2.6* 2.9*   No results for input(s): "LIPASE", "AMYLASE" in the last 168 hours. No results for input(s): "AMMONIA" in the last 168 hours.  ABG    Component Value Date/Time   HCO3 22.3 10/27/2022 0542   TCO2 30 08/30/2022 1859   ACIDBASEDEF 2.1 (H) 10/27/2022 0542   O2SAT 79.7 10/27/2022 0542     Coagulation Profile: No results for input(s): "INR", "PROTIME" in the last 168 hours.  Cardiac Enzymes: No results for input(s): "CKTOTAL", "CKMB", "CKMBINDEX", "TROPONINI" in the last 168 hours.  HbA1C: Hgb A1c MFr Bld  Date/Time Value Ref Range Status  10/26/2022 04:16 AM 5.7 (H) 4.8 - 5.6 % Final    Comment:    (NOTE) Pre diabetes:          5.7%-6.4%  Diabetes:              >6.4%  Glycemic control for   <7.0% adults with diabetes   06/05/2010 07:20 AM  <5.7 % Final   5.2 (NOTE)                                                                       According to the ADA Clinical Practice Recommendations for 2011, when HbA1c is used as a screening test:   >=6.5%  Diagnostic of Diabetes Mellitus           (if abnormal result  is confirmed)  5.7-6.4%   Increased risk of developing Diabetes Mellitus  References:Diagnosis and Classification of Diabetes Mellitus,Diabetes Care,2011,34(Suppl 1):S62-S69 and Standards of Medical Care in         Diabetes - 2011,Diabetes Care,2011,34  (Suppl 1):S11-S61.    CBG: Recent Labs  Lab 10/27/22 1239 10/27/22 1533 10/27/22 1914 10/27/22 2301 10/28/22 0307  GLUCAP 222* 226* 249* 214* 176*    CCT: n/a   Tessie Fass MSN, AGACNP-BC [place] [facility] Amion for pager 10/28/2022, 12:03 PM

## 2022-10-29 ENCOUNTER — Inpatient Hospital Stay (HOSPITAL_COMMUNITY): Payer: Medicare Other

## 2022-10-29 DIAGNOSIS — N3000 Acute cystitis without hematuria: Secondary | ICD-10-CM | POA: Diagnosis not present

## 2022-10-29 DIAGNOSIS — E039 Hypothyroidism, unspecified: Secondary | ICD-10-CM | POA: Diagnosis not present

## 2022-10-29 DIAGNOSIS — E871 Hypo-osmolality and hyponatremia: Secondary | ICD-10-CM | POA: Diagnosis not present

## 2022-10-29 DIAGNOSIS — G9341 Metabolic encephalopathy: Secondary | ICD-10-CM

## 2022-10-29 DIAGNOSIS — A4151 Sepsis due to Escherichia coli [E. coli]: Principal | ICD-10-CM

## 2022-10-29 DIAGNOSIS — E119 Type 2 diabetes mellitus without complications: Secondary | ICD-10-CM | POA: Diagnosis not present

## 2022-10-29 DIAGNOSIS — G9340 Encephalopathy, unspecified: Secondary | ICD-10-CM

## 2022-10-29 LAB — BASIC METABOLIC PANEL
Anion gap: 11 (ref 5–15)
BUN: 14 mg/dL (ref 8–23)
CO2: 18 mmol/L — ABNORMAL LOW (ref 22–32)
Calcium: 8.1 mg/dL — ABNORMAL LOW (ref 8.9–10.3)
Chloride: 100 mmol/L (ref 98–111)
Creatinine, Ser: 0.62 mg/dL (ref 0.44–1.00)
GFR, Estimated: >60 mL/min (ref >60)
Glucose, Bld: 149 mg/dL — ABNORMAL HIGH (ref 70–99)
Potassium: 4.4 mmol/L (ref 3.5–5.1)
Sodium: 129 mmol/L — ABNORMAL LOW (ref 135–145)

## 2022-10-29 LAB — CBC
HCT: 32.8 % — ABNORMAL LOW (ref 36.0–46.0)
Hemoglobin: 10.6 g/dL — ABNORMAL LOW (ref 12.0–15.0)
MCH: 30.7 pg (ref 26.0–34.0)
MCHC: 32.3 g/dL (ref 30.0–36.0)
MCV: 95.1 fL (ref 80.0–100.0)
Platelets: 400 10*3/uL (ref 150–400)
RBC: 3.45 MIL/uL — ABNORMAL LOW (ref 3.87–5.11)
RDW: 14.3 % (ref 11.5–15.5)
WBC: 12 10*3/uL — ABNORMAL HIGH (ref 4.0–10.5)
nRBC: 0.3 % — ABNORMAL HIGH (ref 0.0–0.2)

## 2022-10-29 LAB — GLUCOSE, CAPILLARY
Glucose-Capillary: 144 mg/dL — ABNORMAL HIGH (ref 70–99)
Glucose-Capillary: 160 mg/dL — ABNORMAL HIGH (ref 70–99)
Glucose-Capillary: 163 mg/dL — ABNORMAL HIGH (ref 70–99)
Glucose-Capillary: 173 mg/dL — ABNORMAL HIGH (ref 70–99)
Glucose-Capillary: 156 mg/dL — ABNORMAL HIGH (ref 70–99)

## 2022-10-29 LAB — CULTURE, BLOOD (ROUTINE X 2): Culture: NO GROWTH

## 2022-10-29 MED ORDER — FUROSEMIDE 10 MG/ML IJ SOLN
20.0000 mg | Freq: Once | INTRAMUSCULAR | Status: AC
  Administered 2022-10-29: 20 mg via INTRAVENOUS
  Filled 2022-10-29: qty 2

## 2022-10-29 MED ORDER — GABAPENTIN 100 MG PO CAPS
100.0000 mg | ORAL_CAPSULE | Freq: Once | ORAL | Status: AC
  Administered 2022-10-29: 100 mg via ORAL
  Filled 2022-10-29: qty 1

## 2022-10-29 MED ORDER — TRAMADOL HCL 50 MG PO TABS
50.0000 mg | ORAL_TABLET | Freq: Four times a day (QID) | ORAL | Status: DC | PRN
  Administered 2022-10-29 – 2022-10-31 (×5): 50 mg via ORAL
  Filled 2022-10-29 (×5): qty 1

## 2022-10-29 MED ORDER — HYDRALAZINE HCL 20 MG/ML IJ SOLN
5.0000 mg | Freq: Four times a day (QID) | INTRAMUSCULAR | Status: DC | PRN
  Administered 2022-10-29: 5 mg via INTRAVENOUS
  Filled 2022-10-29: qty 1

## 2022-10-29 NOTE — Progress Notes (Addendum)
[facility]  PROGRESS NOTE  ROBI EAVEY ZOX:[id number] DOB: 1945-05-17 DOA: 10/25/2022 PCP: Charlane Ferretti, DO   Brief HPI:   77 year old female with history of hypertension, wheelchair-bound secondary to polio, hypothyroidism, nephrolithiasis, chronic back pain, GERD who presented with right knee pain.  Patient was also recently diagnosed with UTI and was on Macrobid.  Patient was having dysuria on presentation.  On presentation, she was hemodynamically stable but had soft blood pressure.  Labs showed sodium of 126.  X-ray of the pelvis and right leg did not show any fracture or dislocation.  Urinalysis showed  positive nitrates, many bacteria.Urine culture showing Ecoli. Started on IV antibiotic.  Patient became hypotensive, febrile, confused, hyponatremia worsened.  PCCM consulted, transferred to ICU.  Found to have altered mental status due to sepsis.  Also had septic shock from E. coli and Aerococcus urinary UTI, required pressor support and now weaned off. Patient transferred to [facility] service on 10/29/2022.     Assessment/Plan:   Septic shock -Weaned off norepinephrine; sepsis physiology has resolved -Solu-Cortef has been discontinued, vancomycin discontinued -Secondary to UTI, urine culture grew E. coli and Aerococcus urinae -E. coli sensitive to Rocephin, continue IV Rocephin -Blood culture to date shows no growth  Acute metabolic encephalopathy -In setting of dehydration, septic shock;?  ICU delirium -Slowly improving with treatment as above  Hypothyroidism -TSH elevated at 14 -Free T3 1.6, free T41.4 -Continue levothyroxine 100 mcg daily -TSH is elevated likely in setting of infection, will need repeat TSH at later date   Hypovolemic hyponatremia -Sodium 129, has improved from sodium 122 on 10/27/2022 with IV fluids -Continue normal saline at 75 mill per hour -Urine sodium less than 10, urine osmolality 118 -Follow serum sodium in a.m.  Diabetes mellitus type  2 -Continue sliding scale insulin NovoLog -CBG well-controlled  Bilateral lower extremity edema -Patient has bilateral lower extremity edema, vascular ultrasound was negative for DVT -Likely in setting of hypoalbuminemia with albumin level 2.9 -Need protein supplementation, will consult dietitian  Hypertension -Blood pressure is stable -Continue Coreg  Hyperlipidemia -Continue Lipitor  History of polio/wheelchair-bound -Continue gabapentin, amitriptyline  Medications     amitriptyline  25 mg Oral QHS   atorvastatin  40 mg Oral Daily   bisacodyl  10 mg Rectal Once   carvedilol  12.5 mg Oral BID WC   Chlorhexidine Gluconate Cloth  6 each Topical Daily   enoxaparin (LOVENOX) injection  40 mg Subcutaneous Q24H   gabapentin  100 mg Oral QHS   insulin aspart  0-20 Units Subcutaneous TID WC   insulin aspart  0-5 Units Subcutaneous QHS   levothyroxine  100 mcg Oral Q0600   lidocaine  1 patch Transdermal Q24H   mirabegron ER  50 mg Oral QODAY   pantoprazole  40 mg Oral Daily   polyethylene glycol  17 g Oral Daily   senna  1 tablet Oral BID   zolpidem  5 mg Oral QHS     Data Reviewed:   CBG:  Recent Labs  Lab 10/28/22 0734 10/28/22 1105 10/28/22 1721 10/28/22 2100 10/29/22 0725  GLUCAP 171* 171* 130* 137* 144*    SpO2: 92 %    Vitals:   10/29/22 0630 10/29/22 0700 10/29/22 0739 10/29/22 0747  BP:  137/84  (!) 119/96  Pulse: 67 71  72  Resp: (!) 21 (!) 21    Temp:   98.1 F (36.7 C)   TempSrc:   Oral   SpO2: 91% 92%    Weight:  Height:          Data Reviewed:  Basic Metabolic Panel: Recent Labs  Lab 10/27/22 0506 10/27/22 1137 10/27/22 1138 10/27/22 1556 10/27/22 2224 10/28/22 0428 10/28/22 1050 10/28/22 1621 10/29/22 0122  NA 122* 120* 120* 122* 131* 131* 129* 128* 129*  K 3.8 4.0 4.0 4.0  --  3.3*  --   --  4.4  CL 90* 92* 91* 93*  --  100  --   --  100  CO2 20* 20* 19* 18*  --  21*  --   --  18*  GLUCOSE 174* 231* 232* 265*  --   179*  --   --  149*  BUN 8 12 12 12   --  9  --   --  14  CREATININE 0.63 0.72 0.73 0.60  --  0.44  --   --  0.62  CALCIUM 8.8* 8.1* 8.1* 8.2*  --  8.4*  --   --  8.1*  MG 0.9*  --   --   --   --  1.7  --   --   --   PHOS 2.9  --   --   --   --   --   --   --   --     CBC: Recent Labs  Lab 10/25/22 0602 10/26/22 0833 10/27/22 0544 10/29/22 0122  WBC 11.3* 13.2* 17.7* 12.0*  NEUTROABS 5.4  --  13.6*  --   HGB 10.7* 12.0 11.8* 10.6*  HCT 33.0* 36.6 34.3* 32.8*  MCV 97.6 97.1 90.0 95.1  PLT 462* 470* 442* 400    LFT Recent Labs  Lab 10/25/22 0602 10/27/22 0506  AST 19 31  ALT 15 20  ALKPHOS 60 71  BILITOT 0.6 1.1  PROT 5.7* 6.6  ALBUMIN 2.6* 2.9*     Antibiotics: Anti-infectives (From admission, onward)    Start     Dose/Rate Route Frequency Ordered Stop   10/28/22 1000  vancomycin (VANCOREADY) IVPB 750 mg/150 mL  Status:  Discontinued        750 mg 150 mL/hr over 60 Minutes Intravenous Every 24 hours 10/27/22 0448 10/28/22 1201   10/27/22 0800  cefTRIAXone (ROCEPHIN) 2 g in sodium chloride 0.9 % 100 mL IVPB        2 g 200 mL/hr over 30 Minutes Intravenous Every 24 hours 10/27/22 0422     10/27/22 0545  vancomycin (VANCOREADY) IVPB 1250 mg/250 mL        1,250 mg 166.7 mL/hr over 90 Minutes Intravenous  Once 10/27/22 0448 10/27/22 0710   10/26/22 0800  cefTRIAXone (ROCEPHIN) 1 g in sodium chloride 0.9 % 100 mL IVPB  Status:  Discontinued        1 g 200 mL/hr over 30 Minutes Intravenous Every 24 hours 10/25/22 2117 10/27/22 0422   10/25/22 0630  cefTRIAXone (ROCEPHIN) 2 g in sodium chloride 0.9 % 100 mL IVPB        2 g 200 mL/hr over 30 Minutes Intravenous  Once 10/25/22 2841 10/25/22 0845        DVT prophylaxis: Lovenox  Code Status: Full code  Family Communication: No family at bedside   CONSULTS PCCM   Subjective   Patient continues to be pleasantly confused.  Keeps on saying that she is going to have surgery today.   Objective    Physical  Examination:   General: Appears in no acute distress Cardiovascular: S1-S2, regular Respiratory: Lungs are clear to auscultation bilaterally  Abdomen: Soft, nontender, no organomegaly Extremities: 2+ edema in lower extremities bilaterally Neurologic: Alert, oriented to self and place only   Status is: Inpatient:             Meredeth Ide   [facility] If 7PM-7AM, please contact night-coverage at [email or web address], Office  [phone]   10/29/2022, 8:33 AM  LOS: 4 days

## 2022-10-30 DIAGNOSIS — N3000 Acute cystitis without hematuria: Secondary | ICD-10-CM | POA: Diagnosis not present

## 2022-10-30 LAB — CULTURE, BLOOD (ROUTINE X 2): Special Requests: ADEQUATE

## 2022-10-30 LAB — GLUCOSE, CAPILLARY
Glucose-Capillary: 149 mg/dL — ABNORMAL HIGH (ref 70–99)
Glucose-Capillary: 142 mg/dL — ABNORMAL HIGH (ref 70–99)
Glucose-Capillary: 135 mg/dL — ABNORMAL HIGH (ref 70–99)
Glucose-Capillary: 132 mg/dL — ABNORMAL HIGH (ref 70–99)

## 2022-10-30 MED ORDER — IRBESARTAN 300 MG PO TABS
300.0000 mg | ORAL_TABLET | Freq: Every day | ORAL | Status: DC
  Administered 2022-10-30 – 2022-11-01 (×3): 300 mg via ORAL
  Filled 2022-10-30 (×4): qty 1

## 2022-10-30 MED ORDER — QUETIAPINE FUMARATE 25 MG PO TABS
25.0000 mg | ORAL_TABLET | ORAL | Status: DC
  Administered 2022-10-30 – 2022-10-31 (×2): 25 mg via ORAL
  Filled 2022-10-30 (×2): qty 1

## 2022-10-30 MED ORDER — ENSURE ENLIVE PO LIQD
237.0000 mL | Freq: Two times a day (BID) | ORAL | Status: DC
  Administered 2022-10-30 – 2022-11-01 (×4): 237 mL via ORAL

## 2022-10-30 MED ORDER — ADULT MULTIVITAMIN W/MINERALS CH
1.0000 | ORAL_TABLET | Freq: Every day | ORAL | Status: DC
  Administered 2022-10-30 – 2022-11-01 (×3): 1
  Filled 2022-10-30 (×3): qty 1

## 2022-10-30 MED ORDER — HYDRALAZINE HCL 20 MG/ML IJ SOLN
10.0000 mg | Freq: Four times a day (QID) | INTRAMUSCULAR | Status: DC | PRN
  Administered 2022-11-01: 10 mg via INTRAVENOUS
  Filled 2022-10-30: qty 1

## 2022-10-30 MED ORDER — MELATONIN 5 MG PO TABS
10.0000 mg | ORAL_TABLET | ORAL | Status: AC
  Administered 2022-10-30: 10 mg via ORAL
  Filled 2022-10-30: qty 2

## 2022-10-30 NOTE — Progress Notes (Signed)
Initial Nutrition Assessment  DOCUMENTATION CODES:   Not applicable  INTERVENTION:   - Ensure Enlive po BID, each supplement provides 350 kcal and 20 grams of protein  - Liberalize diet to Regular to provide more food options at mealtimes  - MVI with minerals daily  NUTRITION DIAGNOSIS:   Increased nutrient needs related to acute illness as evidenced by estimated needs.  GOAL:   Patient will meet greater than or equal to 90% of their needs  MONITOR:   PO intake, Supplement acceptance, Labs, Weight trends, I & O's  REASON FOR ASSESSMENT:   Consult Assessment of nutrition requirement/status  ASSESSMENT:   77 year old female who presented to the ED on 6/12 with knee pain. PMH of HTN, wheelchair-bound secondary to polio, hypothyroidism, nephrolithiasis, arthritis, chronic back pain, GERD, T2DM. Pt admitted with UTI, hyponatremia.  06/14 - pt developed septic shock, transferred to ICU, started on pressors  Discussed pt with RN and during ICU rounds. Pt currently on a Heart Healthy/Carb Modified diet with thin liquids. One meal completion of 80% documented.  Spoke with pt's daughter at bedside. Pt sleeping at time of RD visit. Pt's daughter reports that pt has not slept for 48 hours and asks RD to defer physical exam at this time.  Pt's daughter shares that pt "eats like a bird" and that this started about 6 months ago. Pt's daughter expresses concern regarding dementia and reports that pt was hallucinating this morning. Pt has been referred to someone outpatient to evaluate her neurologic function, but this person was out-of-network with insurance.  Pt's daughter is unaware of pt experiencing any recent weight loss. She reports her UBW as 140 or 160 lbs. She is unsure of exact weight. Current weight is up >20 kg since January 2024. Suspect some of weight gain may be related to volume status. Pt with moderate pitting edema to BLE.  Pt's daughter shares that pt did not like the  food yesterday due to taste. Pt currently on a Heart Healthy/Carb Modified diet. Will liberalize to Regular. Will also add oral nutrition supplements and daily MVI with minerals. Daughter on board with plan.  Admit weight: 77.1 kg Current weight: 104.8 kg  Meal Completion: 80% x 1 documented meal  Medications reviewed and include: SSI, protonix, miralax, senna, IV abx  Labs reviewed. CBG's: 142-173 x 24 hours  UOP: 6950 ml x 24 hours I/O's: -7.6 L since admit  NUTRITION - FOCUSED PHYSICAL EXAM:  Deferred per pt's daughter's preference. Pt resting for the first time in 48 hours.  Diet Order:   Diet Order             Diet regular Room service appropriate? Yes with Assist; Fluid consistency: Thin  Diet effective now                   EDUCATION NEEDS:   No education needs have been identified at this time  Skin:  Skin Assessment: Reviewed RN Assessment (skin tear LLE)  Last BM:  10/29/22 medium type 2  Height:   Ht Readings from Last 1 Encounters:  10/25/22 5' (1.524 m)    Weight:   Wt Readings from Last 1 Encounters:  10/29/22 104.8 kg    Ideal Body Weight:  45.5 kg  BMI:  Body mass index is 45.12 kg/m.  Estimated Nutritional Needs:   Kcal:  1750-1950  Protein:  90-110 grams  Fluid:  1.7-1.9 L    Mertie Clause, MS, RD, LDN Inpatient Clinical Dietitian Please see AMi[facility]  for contact information.

## 2022-10-30 NOTE — Progress Notes (Signed)
Family concerns:  The younger daughter has so many concerns.   she is not happy with the lung sounds and not being on O2.  she has diminished breath sounds and SPO2 98%-99%. Incentive spirometry introduced with teach back. SPO2 dropped to 96% once.  Initiated 0.5 L O2 via nasal canula for the comfort.  and she says BP is still seems high. its 140/78. she requests  to have continuous BP monitoring. Just explained for her.  She needs her mother to have a better bed with air matress.    and not happy with once daily antibiotics. Explained everything clearly.  she has many concerns regarding lab results as well.....   basically she needs same ICU care here for her mom. Explained everything clearly. and the charge nurse too explained.    Made provider aware for all of this via secure chat.  Will continue to monitor.

## 2022-10-30 NOTE — Progress Notes (Addendum)
PROGRESS NOTE    SOLSTICE TSAY  ZOX:[id number] DOB: Jul 08, 1945 DOA: 10/25/2022 PCP: Charlane Ferretti, DO   Brief Narrative:  77 year old female with history of hypertension, wheelchair-bound secondary to polio, hypothyroidism, nephrolithiasis, chronic back pain, GERD who presented with right knee pain.  Patient was also recently diagnosed with UTI and was on Macrobid.  Patient was having dysuria on presentation.  On presentation, she was hemodynamically stable but had soft blood pressure.  Labs showed sodium of 126.  X-ray of the pelvis and right leg did not show any fracture or dislocation.  Urinalysis showed  positive nitrates, many bacteria.Urine culture showing Ecoli. Started on IV antibiotic.  Patient became hypotensive, febrile, confused, hyponatremia worsened.  PCCM consulted, transferred to ICU.  Found to have altered mental status due to sepsis.  Also had septic shock from E. coli and Aerococcus urinary UTI, required pressor support and now weaned off. Patient transferred to [facility] service on 10/29/2022.  Assessment & Plan:   Principal Problem:   UTI (urinary tract infection) Active Problems:   Right knee pain   Hyponatremia   Leg wound, left   Controlled type 2 diabetes mellitus without complication, without long-term current use of insulin (HCC)   Essential hypertension   Hyperlipidemia   Hypothyroidism   Post-polio syndrome   Wheelchair dependence   Obesity (BMI 30-39.9)   Sepsis due to Escherichia coli with encephalopathy and septic shock (HCC)   Septic shock (HCC)   Encephalopathy acute  Septic shock secondary to UTI: Initially admitted to hospital service however developed hypotension, diagnosed with septic shock, transferred to ICU under PCCM on 10/27/2022 and required vasopressors.  Was weaned off within 24 hours, transferred back under TRH on 10/29/2022.  Urine culture growing E. coli which is sensitive to cephalosporins, continue Rocephin for total 7 days with the last dose  on 10/31/2022.    Acute metabolic encephalopathy/acute delirium: Likely secondary to septic shock and UTI.  Patient's mentation is improved but still not back to baseline, verified by the daughter.  Daughter also tells me that earlier, she was having some auditory hallucinations.  She further added that patient has not slept for 48 hours.  I think she is likely having delirium.  I will start her on Seroquel. 1. Avoid benzodiazepines, antihistamines, anticholinergics, and minimize opiate use as these may worsen delirium. 2: Assess, prevent and manage pain as lack of treatment can result in delirium.  3: Provide appropriate lighting and clear signage; a clock and calendar should be easily visible to the patient. 4: Monitor environmental factors. Reduce light and noise at night (close shades, turn off lights, turn off TV, ect). Correct any alterations in sleep cycle. 5: Reorient the patient to person, place, time and situation on each encounter.  6: Correct sensory deficits if possible (replace eye glasses, hearing aids, ect). 7: Avoid restraints if able. Severely delirious patients benefit from constant observation by a sitter.   left leg wound/cellulitis: Daughter at the bedside concerned about the wound on the left leg.  She tells me that the leg was scraped when she patient's husband was trying to place her on Margaret lift when [place] was [place] the beach recently.  On examination, she appears to have open wound with mild erythema around it consistent with cellulitis.  This is concerning for osteomyelitis so I will proceed with CT lower extremity with contrast.  Continue Rocephin for now.   Hyponatremia: Baseline sodium in the 130s.  Patient was taking diuretics at home.  She presented  with sodium of 122, started on gentle IV fluids.,  Sodium improved to 129 today.  Continue to hold diuretics.   Diabetes type 2: Appears well-controlled on metformin.  Continue sliding scale here, monitor blood sugars    Hypertension: Hypotensive on presentation.  Blood pressure now rising and high.  She is on Coreg 12.5 p.o. twice daily, will resume irbesartan and add as needed hydralazine.   Hyperlipidemia: On Lipitor   Hypothyroidism: On Synthyroid   History of polio/wheelchair-bound/debility: On gabapentin, Zanaflex.  Patient is nonambulatory.   Obesity: BMI 33.2  DVT prophylaxis: enoxaparin (LOVENOX) injection 40 mg Start: 10/25/22 1800   Code Status: Full Code  Family Communication: Daughter present at bedside.  Plan of care discussed with patient in length and he/she verbalized understanding and agreed with it.  Status is: Inpatient Remains inpatient appropriate because: Recovering from encephalopathy and still needs IV antibiotics and further workup for left leg wound.   Estimated body mass index is 45.12 kg/m as calculated from the following:   Height as of this encounter: 5' (1.524 m).   Weight as of this encounter: 104.8 kg.    Nutritional Assessment: Body mass index is 45.12 kg/m.Marland Kitchen Seen by dietician.  I agree with the assessment and plan as outlined below: Nutrition Status:        . Skin Assessment: I have examined the patient's skin and I agree with the wound assessment as performed by the wound care RN as outlined below:    Consultants:  None  Procedures:  None  Antimicrobials:  Anti-infectives (From admission, onward)    Start     Dose/Rate Route Frequency Ordered Stop   10/28/22 1000  vancomycin (VANCOREADY) IVPB 750 mg/150 mL  Status:  Discontinued        750 mg 150 mL/hr over 60 Minutes Intravenous Every 24 hours 10/27/22 0448 10/28/22 1201   10/27/22 0800  cefTRIAXone (ROCEPHIN) 2 g in sodium chloride 0.9 % 100 mL IVPB        2 g 200 mL/hr over 30 Minutes Intravenous Every 24 hours 10/27/22 0422 10/31/22 1200   10/27/22 0545  vancomycin (VANCOREADY) IVPB 1250 mg/250 mL        1,250 mg 166.7 mL/hr over 90 Minutes Intravenous  Once 10/27/22 0448 10/27/22 0710    10/26/22 0800  cefTRIAXone (ROCEPHIN) 1 g in sodium chloride 0.9 % 100 mL IVPB  Status:  Discontinued        1 g 200 mL/hr over 30 Minutes Intravenous Every 24 hours 10/25/22 2117 10/27/22 0422   10/25/22 0630  cefTRIAXone (ROCEPHIN) 2 g in sodium chloride 0.9 % 100 mL IVPB        2 g 200 mL/hr over 30 Minutes Intravenous  Once 10/25/22 4098 10/25/22 0845         Subjective: Patient seen and examined.  She is alert and oriented to self and year but could not tell me where she was at and what month it is.  She had no complaint.  Daughter at the bedside.  Objective: Vitals:   10/30/22 0700 10/30/22 0754 10/30/22 0800 10/30/22 0908  BP: (!) 144/110 (!) 195/121 (!) 161/96   Pulse: 67 70 70   Resp: 17  16   Temp:    99.1 F (37.3 C)  TempSrc:    Axillary  SpO2: 94%  96%   Weight:      Height:        Intake/Output Summary (Last 24 hours) at 10/30/2022 1010 Last data filed  at 10/30/2022 0900 Gross per 24 hour  Intake 885.04 ml  Output 7520 ml  Net -6634.96 ml   Filed Weights   10/25/22 0729 10/27/22 1237 10/29/22 0400  Weight: 77.1 kg 102.9 kg 104.8 kg    Examination:  General exam: Appears calm and comfortable  Respiratory system: Clear to auscultation. Respiratory effort normal. Cardiovascular system: S1 & S2 heard, RRR. No JVD, murmurs, rubs, gallops or clicks. No pedal edema. Gastrointestinal system: Abdomen is nondistended, soft and nontender. No organomegaly or masses felt. Normal bowel sounds heard. Central nervous system: Alert and oriented x 1.  Bedbound due to polio Extremities: Symmetric 5 x 5 power. Skin: Large open wound at the left lower extremity with surrounding erythema. Psychiatry: Judgement and insight appear poor   Data Reviewed: I have personally reviewed following labs and imaging studies  CBC: Recent Labs  Lab 10/25/22 0602 10/26/22 0833 10/27/22 0544 10/29/22 0122  WBC 11.3* 13.2* 17.7* 12.0*  NEUTROABS 5.4  --  13.6*  --   HGB 10.7*  12.0 11.8* 10.6*  HCT 33.0* 36.6 34.3* 32.8*  MCV 97.6 97.1 90.0 95.1  PLT 462* 470* 442* 400   Basic Metabolic Panel: Recent Labs  Lab 10/27/22 0506 10/27/22 1137 10/27/22 1138 10/27/22 1556 10/27/22 2224 10/28/22 0428 10/28/22 1050 10/28/22 1621 10/29/22 0122  NA 122* 120* 120* 122* 131* 131* 129* 128* 129*  K 3.8 4.0 4.0 4.0  --  3.3*  --   --  4.4  CL 90* 92* 91* 93*  --  100  --   --  100  CO2 20* 20* 19* 18*  --  21*  --   --  18*  GLUCOSE 174* 231* 232* 265*  --  179*  --   --  149*  BUN 8 12 12 12   --  9  --   --  14  CREATININE 0.63 0.72 0.73 0.60  --  0.44  --   --  0.62  CALCIUM 8.8* 8.1* 8.1* 8.2*  --  8.4*  --   --  8.1*  MG 0.9*  --   --   --   --  1.7  --   --   --   PHOS 2.9  --   --   --   --   --   --   --   --    GFR: Estimated Creatinine Clearance: 62.3 mL/min (by C-G formula based on SCr of 0.62 mg/dL). Liver Function Tests: Recent Labs  Lab 10/25/22 0602 10/27/22 0506  AST 19 31  ALT 15 20  ALKPHOS 60 71  BILITOT 0.6 1.1  PROT 5.7* 6.6  ALBUMIN 2.6* 2.9*   No results for input(s): "LIPASE", "AMYLASE" in the last 168 hours. No results for input(s): "AMMONIA" in the last 168 hours. Coagulation Profile: No results for input(s): "INR", "PROTIME" in the last 168 hours. Cardiac Enzymes: No results for input(s): "CKTOTAL", "CKMB", "CKMBINDEX", "TROPONINI" in the last 168 hours. BNP (last 3 results) No results for input(s): "PROBNP" in the last 8760 hours. HbA1C: No results for input(s): "HGBA1C" in the last 72 hours. CBG: Recent Labs  Lab 10/29/22 1141 10/29/22 1624 10/29/22 1910 10/29/22 2126 10/30/22 0725  GLUCAP 160* 163* 173* 156* 149*   Lipid Profile: No results for input(s): "CHOL", "HDL", "LDLCALC", "TRIG", "CHOLHDL", "LDLDIRECT" in the last 72 hours. Thyroid Function Tests: Recent Labs    10/27/22 1137 10/27/22 1138  FREET4  --  1.54*  T3FREE 1.6*  --  Anemia Panel: No results for input(s): "VITAMINB12", "FOLATE",  "FERRITIN", "TIBC", "IRON", "RETICCTPCT" in the last 72 hours. Sepsis Labs: Recent Labs  Lab 10/27/22 0506 10/27/22 0758 10/27/22 1250 10/27/22 1556  LATICACIDVEN 2.5* 1.8 1.4 1.0    Recent Results (from the past 240 hour(s))  Urine Culture (for pregnant, neutropenic or urologic patients or patients with an indwelling urinary catheter)     Status: Abnormal   Collection Time: 10/25/22  5:33 AM   Specimen: In/Out Cath Urine  Result Value Ref Range Status   Specimen Description IN/OUT CATH URINE  Final   Special Requests   Final    NONE Performed at [facility], [address], [place], [place] [address]    Culture (A)  Final    >=100,000 COLONIES/mL ESCHERICHIA COLI >=100,000 COLONIES/mL AEROCOCCUS URINAE    Report Status 10/28/2022 FINAL  Final   Organism ID, Bacteria ESCHERICHIA COLI (A)  Final      Susceptibility   Escherichia coli - MIC*    AMPICILLIN >=32 RESISTANT Resistant     CEFAZOLIN <=4 SENSITIVE Sensitive     CEFEPIME <=0.12 SENSITIVE Sensitive     CEFTRIAXONE <=0.25 SENSITIVE Sensitive     CIPROFLOXACIN 1 RESISTANT Resistant     GENTAMICIN <=1 SENSITIVE Sensitive     IMIPENEM <=0.25 SENSITIVE Sensitive     NITROFURANTOIN 64 INTERMEDIATE Intermediate     TRIMETH/SULFA <=20 SENSITIVE Sensitive     AMPICILLIN/SULBACTAM 16 INTERMEDIATE Intermediate     PIP/TAZO <=4 SENSITIVE Sensitive     * >=100,000 COLONIES/mL ESCHERICHIA COLI  Culture, blood (Routine X 2) w Reflex to ID Panel     Status: None (Preliminary result)   Collection Time: 10/27/22  5:06 AM   Specimen: BLOOD  Result Value Ref Range Status   Specimen Description BLOOD BLOOD LEFT HAND  Final   Special Requests   Final    BOTTLES DRAWN AEROBIC AND ANAEROBIC Blood Culture adequate volume   Culture   Final    NO GROWTH 3 DAYS Performed at [facility], [address] [place], [place] [address]    Report Status PENDING  Incomplete  Culture, blood (Routine X 2) w Reflex to ID Panel      Status: None (Preliminary result)   Collection Time: 10/27/22  5:06 AM   Specimen: BLOOD  Result Value Ref Range Status   Specimen Description BLOOD BLOOD LEFT HAND  Final   Special Requests   Final    BOTTLES DRAWN AEROBIC AND ANAEROBIC Blood Culture adequate volume   Culture   Final    NO GROWTH 3 DAYS Performed at [facility], [address], [place], [place] [address]    Report Status PENDING  Incomplete  MRSA Next Gen by PCR, Nasal     Status: None   Collection Time: 10/27/22  5:42 AM   Specimen: Nasal Mucosa; Nasal Swab  Result Value Ref Range Status   MRSA by PCR Next Gen NOT DETECTED NOT DETECTED Final    Comment: (NOTE) The GeneXpert MRSA Assay (FDA approved for NASAL specimens only), is one component of a comprehensive MRSA colonization surveillance program. It is not intended to diagnose MRSA infection nor to guide or monitor treatment for MRSA infections. Test performance is not FDA approved in patients less than 69 years old. Performed at [facility], [address], [place], [place] [address]      Radiology Studies: CT HEAD WO CONTRAST ( )  Result Date: 10/29/2022 CLINICAL  DATA:  Altered mental status EXAM: CT HEAD WITHOUT CONTRAST TECHNIQUE: Contiguous axial images were obtained from the base of the skull through the vertex without intravenous contrast. RADIATION DOSE REDUCTION: This exam was performed according to the departmental dose-optimization program which includes automated exposure control, adjustment of the mA and/or kV according to patient size and/or use of iterative reconstruction technique. COMPARISON:  08/30/2022 FINDINGS: Brain: No evidence of acute infarction, hemorrhage, mass, mass effect, or midline shift. No hydrocephalus or extra-axial fluid collection. Age related cerebral atrophy, unchanged from the prior. Periventricular white matter changes, likely the sequela of chronic small vessel ischemic disease. Vascular: No hyperdense  vessel. Skull: Negative for fracture or focal lesion. Sinuses/Orbits: Mucosal thickening in the ethmoid air cells, with an air-fluid level in the posterior right ethmoid air cells. Status post bilateral lens replacements. Other: The mastoid air cells are well aerated. IMPRESSION: No acute intracranial process. Electronically Signed   By: Wiliam Ke M.D.   On: 10/29/2022 21:11   DG Chest 1 View  Result Date: 10/29/2022 CLINICAL DATA:  Dyspnea EXAM: CHEST  1 VIEW COMPARISON:  Chest x-ray 10/27/2022 FINDINGS: Heart is enlarged. There some minimal interstitial opacities in the left lung base with small left pleural effusion. There is no pneumothorax or acute fracture. Findings are similar to prior. IMPRESSION: Cardiomegaly with minimal interstitial opacities in the left lung base and small left pleural effusion. Electronically Signed   By: Darliss Cheney M.D.   On: 10/29/2022 17:09    Scheduled Meds:  amitriptyline  25 mg Oral QHS   atorvastatin  40 mg Oral Daily   bisacodyl  10 mg Rectal Once   carvedilol  12.5 mg Oral BID WC   Chlorhexidine Gluconate Cloth  6 each Topical Daily   enoxaparin (LOVENOX) injection  40 mg Subcutaneous Q24H   gabapentin  100 mg Oral QHS   insulin aspart  0-20 Units Subcutaneous TID WC   insulin aspart  0-5 Units Subcutaneous QHS   irbesartan  300 mg Oral Daily   levothyroxine  100 mcg Oral Q0600   lidocaine  1 patch Transdermal Q24H   mirabegron ER  50 mg Oral QODAY   pantoprazole  40 mg Oral Daily   polyethylene glycol  17 g Oral Daily   QUEtiapine  25 mg Oral Q24H   senna  1 tablet Oral BID   zolpidem  5 mg Oral QHS   Continuous Infusions:  sodium chloride     cefTRIAXone (ROCEPHIN)  IV 2 g (10/30/22 0802)     LOS: 5 days   Hughie Closs, MD [facility]  10/30/2022, 10:10 AM   *Please note that this is a verbal dictation therefore any spelling or grammatical errors are due to the "Dragon Medical One" system interpretation.  Please page via  Amion and do not message via secure chat for urgent patient care matters. Secure chat can be used for non urgent patient care matters.  How to contact the [facility] Attending or Consulting provider 7A - 7P or covering provider during after hours 7P -7A, for this patient?  Check the care team in [facility] and look for a) attending/consulting [facility] provider listed and b) the [facility] team listed. Page or secure chat 7A-7P. Log into [email or web address] and use Maish Vaya's universal password to access. If you do not have the password, please contact the hospital operator. Locate the [facility] provider you are looking for under [facility] and page to a number that you can be directly reached. If you  still have difficulty reaching the provider, please page the [facility] (Director on Call) for the Hospitalists listed on amion for assistance.

## 2022-10-30 NOTE — Progress Notes (Signed)
Patient received from ICU at 1430 PM via bed.Alert to the self.will continue to monitor.

## 2022-10-30 NOTE — Progress Notes (Signed)
Pt transferred to 2W room 37, belongings place at bedside.

## 2022-10-31 ENCOUNTER — Inpatient Hospital Stay (HOSPITAL_COMMUNITY): Payer: Medicare Other

## 2022-10-31 DIAGNOSIS — N3000 Acute cystitis without hematuria: Secondary | ICD-10-CM | POA: Diagnosis not present

## 2022-10-31 LAB — GLUCOSE, CAPILLARY
Glucose-Capillary: 134 mg/dL — ABNORMAL HIGH (ref 70–99)
Glucose-Capillary: 176 mg/dL — ABNORMAL HIGH (ref 70–99)
Glucose-Capillary: 166 mg/dL — ABNORMAL HIGH (ref 70–99)
Glucose-Capillary: 142 mg/dL — ABNORMAL HIGH (ref 70–99)

## 2022-10-31 LAB — CBC WITH DIFFERENTIAL/PLATELET
Abs Immature Granulocytes: 0.09 10*3/uL — ABNORMAL HIGH (ref 0.00–0.07)
Basophils Absolute: 0.1 10*3/uL (ref 0.0–0.1)
Basophils Relative: 1 %
Eosinophils Absolute: 0.6 10*3/uL — ABNORMAL HIGH (ref 0.0–0.5)
Eosinophils Relative: 5 %
HCT: 33.3 % — ABNORMAL LOW (ref 36.0–46.0)
Hemoglobin: 10.8 g/dL — ABNORMAL LOW (ref 12.0–15.0)
Immature Granulocytes: 1 %
Lymphocytes Relative: 29 %
Lymphs Abs: 3.3 10*3/uL (ref 0.7–4.0)
MCH: 30.3 pg (ref 26.0–34.0)
MCHC: 32.4 g/dL (ref 30.0–36.0)
MCV: 93.5 fL (ref 80.0–100.0)
Monocytes Absolute: 1.1 10*3/uL — ABNORMAL HIGH (ref 0.1–1.0)
Monocytes Relative: 10 %
Neutro Abs: 5.9 10*3/uL (ref 1.7–7.7)
Neutrophils Relative %: 54 %
Platelets: 432 10*3/uL — ABNORMAL HIGH (ref 150–400)
RBC: 3.56 MIL/uL — ABNORMAL LOW (ref 3.87–5.11)
RDW: 14.1 % (ref 11.5–15.5)
WBC: 11.1 10*3/uL — ABNORMAL HIGH (ref 4.0–10.5)
nRBC: 0 % (ref 0.0–0.2)

## 2022-10-31 LAB — BASIC METABOLIC PANEL
Anion gap: 8 (ref 5–15)
BUN: 7 mg/dL — ABNORMAL LOW (ref 8–23)
CO2: 24 mmol/L (ref 22–32)
Calcium: 8.6 mg/dL — ABNORMAL LOW (ref 8.9–10.3)
Chloride: 101 mmol/L (ref 98–111)
Creatinine, Ser: 0.47 mg/dL (ref 0.44–1.00)
GFR, Estimated: >60 mL/min (ref >60)
Glucose, Bld: 140 mg/dL — ABNORMAL HIGH (ref 70–99)
Potassium: 4 mmol/L (ref 3.5–5.1)
Sodium: 133 mmol/L — ABNORMAL LOW (ref 135–145)

## 2022-10-31 LAB — CULTURE, BLOOD (ROUTINE X 2): Special Requests: ADEQUATE

## 2022-10-31 MED ORDER — SODIUM CHLORIDE 0.9 % IV SOLN
2.0000 g | INTRAVENOUS | Status: AC
  Administered 2022-11-01: 2 g via INTRAVENOUS
  Filled 2022-10-31: qty 20

## 2022-10-31 MED ORDER — IOHEXOL 350 MG/ML SOLN
75.0000 mL | Freq: Once | INTRAVENOUS | Status: AC | PRN
  Administered 2022-10-31: 75 mL via INTRAVENOUS

## 2022-10-31 NOTE — Progress Notes (Signed)
Family Education:  Family were educated and demonstrated about bed change, linen change and personal care at home.

## 2022-10-31 NOTE — TOC Progression Note (Addendum)
Transition of Care [facility]) - Progression Note    Patient Details  Name: [name] MRN: [id number] Date of Birth: Apr 20, 1946  [facility] CM/SW Contact  Janae Bridgeman, RN Phone Number: 10/31/2022, 2:01 PM  Clinical Narrative:    CM met with the patient and daughters at the bedside to discuss needs for home.  The patient has an electric hospital bed at the home with a romote control that will provide mobility and care for the patient at this time.  The patient states that the side rails need to be more sturdy.  I spoke with the daughters and they are unaware as to what brand the hospital bed is from.  I asked that the daughter's have the PT with [facility] to assist and call the company to fix or provide new side rails.  The patient also uses a Purewick at the home that may be causing increased incidence of UTI in the home.  I made suggestions to limit use.  I provided Medicare choice to the patient regarding home health services and she did not have a preference.  I called [facility] and Kandee Keen accepted for home health services.  The patient sometime does not get to the bedside commode in time to have bowel movement.  I provided the patient's daughters with 2 types of bedpans to take home.  The patient needs home health services to support care in the home regarding wound care and home safety equipment follow up.  Home health orders were placed and I asked attending MD to co-sign.  The patient plans to discharge home by car tomorrow with her husband that has handicap access equipment in the vehicle.   Expected Discharge Plan: Home/Self Care Barriers to Discharge: Continued Medical Work up  Expected Discharge Plan and Services   Discharge Planning Services: CM Consult Post Acute Care Choice: Resumption of Svcs/PTA Provider Living arrangements for the past 2 months: Single Family Home                                       Social Determinants of Health  (SDOH) Interventions SDOH Screenings   Food Insecurity: No Food Insecurity (10/30/2022)  Housing: Low Risk  (10/30/2022)  Transportation Needs: No Transportation Needs (10/30/2022)  Utilities: Not At Risk (10/30/2022)  Depression (PHQ2-9): Low Risk  (05/30/2021)  Tobacco Use: Low Risk  (10/25/2022)    Readmission Risk Interventions    10/26/2022   12:31 PM  Readmission Risk Prevention Plan  Transportation Screening Complete  PCP or Specialist Appt within 5-7 Days Complete  Home Care Screening Complete  Medication Review (RN CM) Complete

## 2022-10-31 NOTE — Progress Notes (Signed)
PROGRESS NOTE    [name]  ZOX:[id number] DOB: 04-10-1946 DOA: 10/25/2022 PCP: Charlane Ferretti, DO   Brief Narrative:  77 year old female with history of hypertension, wheelchair-bound secondary to polio, hypothyroidism, nephrolithiasis, chronic back pain, GERD who presented with right knee pain.  Patient was also recently diagnosed with UTI and was on Macrobid.  Patient was having dysuria on presentation.  On presentation, she was hemodynamically stable but had soft blood pressure.  Labs showed sodium of 126.  X-ray of the pelvis and right leg did not show any fracture or dislocation.  Urinalysis showed  positive nitrates, many bacteria.Urine culture showing Ecoli. Started on IV antibiotic.  Patient became hypotensive, febrile, confused, hyponatremia worsened.  PCCM consulted, transferred to ICU.  Found to have altered mental status due to sepsis.  Also had septic shock from E. coli and Aerococcus urinary UTI, required pressor support and now weaned off. Patient transferred to [facility] service on 10/29/2022.  Assessment & Plan:   Principal Problem:   UTI (urinary tract infection) Active Problems:   Right knee pain   Hyponatremia   Leg wound, left   Controlled type 2 diabetes mellitus without complication, without long-term current use of insulin (HCC)   Essential hypertension   Hyperlipidemia   Hypothyroidism   Post-polio syndrome   Wheelchair dependence   Obesity (BMI 30-39.9)   Sepsis due to Escherichia coli with encephalopathy and septic shock (HCC)   Septic shock (HCC)   Encephalopathy acute  Septic shock secondary to UTI: Initially admitted to hospital service however developed hypotension, diagnosed with septic shock, transferred to ICU under PCCM on 10/27/2022 and required vasopressors.  Was weaned off within 24 hours, transferred back under TRH on 10/29/2022.  Urine culture growing E. coli which is sensitive to cephalosporins, continue Rocephin for total 7 days with the last dose  on 10/31/2022.  However I will resume Rocephin for left lower leg cellulitis.  Acute metabolic encephalopathy/acute delirium: Likely secondary to septic shock and UTI.  Mentation improved, she is fully alert and oriented.  Continue Seroquel. 1. Avoid benzodiazepines, antihistamines, anticholinergics, and minimize opiate use as these may worsen delirium. 2: Assess, prevent and manage pain as lack of treatment can result in delirium.  3: Provide appropriate lighting and clear signage; a clock and calendar should be easily visible to the patient. 4: Monitor environmental factors. Reduce light and noise at night (close shades, turn off lights, turn off TV, ect). Correct any alterations in sleep cycle. 5: Reorient the patient to person, place, time and situation on each encounter.  6: Correct sensory deficits if possible (replace eye glasses, hearing aids, ect). 7: Avoid restraints if able. Severely delirious patients benefit from constant observation by a sitter.   left leg wound/cellulitis: CT leg negative for osteomyelitis or abscess.  Visibly cellulitis/erythema is improving.  Will resume Rocephin as long as she is here.  Will plan on few more days of oral antibiotics at discharge, hopefully tomorrow.  Consulting wound care today.   Hyponatremia: Baseline sodium in the 130s.  Patient was taking diuretics at home.  She presented with sodium of 122, started on gentle IV fluids.,  Sodium improved to 133 today.  Continue to hold diuretics.   Diabetes type 2: Appears well-controlled on metformin.  Continue sliding scale here, monitor blood sugars   Hypertension: Hypotensive on presentation.  Blood pressure now rising and high.  She is on Coreg 12.5 p.o. twice daily, will resume irbesartan and add as needed hydralazine.   Hyperlipidemia: On  Lipitor   Hypothyroidism: On Synthyroid   History of polio/wheelchair-bound/debility: On gabapentin, Zanaflex.  Patient is nonambulatory.   Obesity: BMI  33.2  DVT prophylaxis: enoxaparin (LOVENOX) injection 40 mg Start: 10/25/22 1800   Code Status: Full Code  Family Communication: Husband present at bedside.  Plan of care discussed with patient in length and he/she verbalized understanding and agreed with it.  Also discussed with her daughter Alexia Freestone over the phone.  Status is: Inpatient Remains inpatient appropriate because: Recovering from encephalopathy, family is requesting hospital bed, plan for discharge tomorrow.  TOC consulted.   Estimated body mass index is 45.12 kg/m as calculated from the following:   Height as of this encounter: 5' (1.524 m).   Weight as of this encounter: 104.8 kg.    Nutritional Assessment: Body mass index is 45.12 kg/m.Marland Kitchen Seen by dietician.  I agree with the assessment and plan as outlined below: Nutrition Status: Nutrition Problem: Increased nutrient needs Etiology: acute illness Signs/Symptoms: estimated needs Interventions: Ensure Enlive (each supplement provides 350kcal and 20 grams of protein), Liberalize Diet, MVI  . Skin Assessment: I have examined the patient's skin and I agree with the wound assessment as performed by the wound care RN as outlined below:    Consultants:  None  Procedures:  None  Antimicrobials:  Anti-infectives (From admission, onward)    Start     Dose/Rate Route Frequency Ordered Stop   10/31/22 1330  cefTRIAXone (ROCEPHIN) 2 g in sodium chloride 0.9 % 100 mL IVPB        2 g 200 mL/hr over 30 Minutes Intravenous Every 24 hours 10/31/22 1242 11/01/22 1329   10/28/22 1000  vancomycin (VANCOREADY) IVPB 750 mg/150 mL  Status:  Discontinued        750 mg 150 mL/hr over 60 Minutes Intravenous Every 24 hours 10/27/22 0448 10/28/22 1201   10/27/22 0800  cefTRIAXone (ROCEPHIN) 2 g in sodium chloride 0.9 % 100 mL IVPB        2 g 200 mL/hr over 30 Minutes Intravenous Every 24 hours 10/27/22 0422 10/31/22 0935   10/27/22 0545  vancomycin (VANCOREADY) IVPB 1250 mg/250 mL         1,250 mg 166.7 mL/hr over 90 Minutes Intravenous  Once 10/27/22 0448 10/27/22 0710   10/26/22 0800  cefTRIAXone (ROCEPHIN) 1 g in sodium chloride 0.9 % 100 mL IVPB  Status:  Discontinued        1 g 200 mL/hr over 30 Minutes Intravenous Every 24 hours 10/25/22 2117 10/27/22 0422   10/25/22 0630  cefTRIAXone (ROCEPHIN) 2 g in sodium chloride 0.9 % 100 mL IVPB        2 g 200 mL/hr over 30 Minutes Intravenous  Once 10/25/22 3244 10/25/22 0845         Subjective: Patient seen and examined at the bedside earlier.  Husband at the bedside.  Patient much better clinically and mentally, fully alert and oriented.  Had complained of right leg pain but that seems to be chronic.  Per nursing reports, patient's niece was at the bedside yesterday and she had several impractical concerns as documented in nurses note yesterday.  I had lengthy discussion with patient's daughter Alexia Freestone today and we set clear expectations that I will be talking to 1 person regarding patient's plan of care and that will be Patty.  She also agreed with that.  Kathie Rhodes is requesting hospital bed.  I have informed TOC.  Objective: Vitals:   10/30/22 1800 10/30/22 2043 10/31/22 0458  10/31/22 0822  BP:  124/66 137/62 (!) 148/65  Pulse:  72 72 68  Resp:  16 18   Temp:  97.8 F (36.6 C) 98.1 F (36.7 C)   TempSrc:      SpO2: 95% 96% 96% 94%  Weight:      Height:        Intake/Output Summary (Last 24 hours) at 10/31/2022 1243 Last data filed at 10/31/2022 1101 Gross per 24 hour  Intake --  Output 2800 ml  Net -2800 ml    Filed Weights   10/25/22 0729 10/27/22 1237 10/29/22 0400  Weight: 77.1 kg 102.9 kg 104.8 kg    Examination:  General exam: Appears calm and comfortable, morbidly obese Respiratory system: Clear to auscultation. Respiratory effort normal. Cardiovascular system: S1 & S2 heard, RRR. No JVD, murmurs, rubs, gallops or clicks. No pedal edema. Gastrointestinal system: Abdomen is nondistended, soft and  nontender. No organomegaly or masses felt. Normal bowel sounds heard. Central nervous system: Alert and oriented. No focal neurological deficits. Extremities: Wound at the left lower extremity. Psychiatry: Judgement and insight appear normal. Mood & affect appropriate.    Data Reviewed: I have personally reviewed following labs and imaging studies  CBC: Recent Labs  Lab 10/25/22 0602 10/26/22 0833 10/27/22 0544 10/29/22 0122 10/31/22 0437  WBC 11.3* 13.2* 17.7* 12.0* 11.1*  NEUTROABS 5.4  --  13.6*  --  5.9  HGB 10.7* 12.0 11.8* 10.6* 10.8*  HCT 33.0* 36.6 34.3* 32.8* 33.3*  MCV 97.6 97.1 90.0 95.1 93.5  PLT 462* 470* 442* 400 432*    Basic Metabolic Panel: Recent Labs  Lab 10/27/22 0506 10/27/22 1137 10/27/22 1138 10/27/22 1556 10/27/22 2224 10/28/22 0428 10/28/22 1050 10/28/22 1621 10/29/22 0122 10/31/22 0437  NA 122*   < > 120* 122*   < > 131* 129* 128* 129* 133*  K 3.8   < > 4.0 4.0  --  3.3*  --   --  4.4 4.0  CL 90*   < > 91* 93*  --  100  --   --  100 101  CO2 20*   < > 19* 18*  --  21*  --   --  18* 24  GLUCOSE 174*   < > 232* 265*  --  179*  --   --  149* 140*  BUN 8   < > 12 12  --  9  --   --  14 7*  CREATININE 0.63   < > 0.73 0.60  --  0.44  --   --  0.62 0.47  CALCIUM 8.8*   < > 8.1* 8.2*  --  8.4*  --   --  8.1* 8.6*  MG 0.9*  --   --   --   --  1.7  --   --   --   --   PHOS 2.9  --   --   --   --   --   --   --   --   --    < > = values in this interval not displayed.    GFR: Estimated Creatinine Clearance: 62.3 mL/min (by C-G formula based on SCr of 0.47 mg/dL). Liver Function Tests: Recent Labs  Lab 10/25/22 0602 10/27/22 0506  AST 19 31  ALT 15 20  ALKPHOS 60 71  BILITOT 0.6 1.1  PROT 5.7* 6.6  ALBUMIN 2.6* 2.9*    No results for input(s): "LIPASE", "AMYLASE" in the last 168 hours.  No results for input(s): "AMMONIA" in the last 168 hours. Coagulation Profile: No results for input(s): "INR", "PROTIME" in the last 168 hours. Cardiac  Enzymes: No results for input(s): "CKTOTAL", "CKMB", "CKMBINDEX", "TROPONINI" in the last 168 hours. BNP (last 3 results) No results for input(s): "PROBNP" in the last 8760 hours. HbA1C: No results for input(s): "HGBA1C" in the last 72 hours. CBG: Recent Labs  Lab 10/30/22 1146 10/30/22 1645 10/30/22 2041 10/31/22 0821 10/31/22 1215  GLUCAP 142* 135* 132* 134* 176*    Lipid Profile: No results for input(s): "CHOL", "HDL", "LDLCALC", "TRIG", "CHOLHDL", "LDLDIRECT" in the last 72 hours. Thyroid Function Tests: No results for input(s): "TSH", "T4TOTAL", "FREET4", "T3FREE", "THYROIDAB" in the last 72 hours.  Anemia Panel: No results for input(s): "VITAMINB12", "FOLATE", "FERRITIN", "TIBC", "IRON", "RETICCTPCT" in the last 72 hours. Sepsis Labs: Recent Labs  Lab 10/27/22 0506 10/27/22 0758 10/27/22 1250 10/27/22 1556  LATICACIDVEN 2.5* 1.8 1.4 1.0     Recent Results (from the past 240 hour(s))  Urine Culture (for pregnant, neutropenic or urologic patients or patients with an indwelling urinary catheter)     Status: Abnormal   Collection Time: 10/25/22  5:33 AM   Specimen: In/Out Cath Urine  Result Value Ref Range Status   Specimen Description IN/OUT CATH URINE  Final   Special Requests   Final    NONE Performed at [facility], [address], [place], [place] [address]    Culture (A)  Final    >=100,000 COLONIES/mL ESCHERICHIA COLI >=100,000 COLONIES/mL AEROCOCCUS URINAE    Report Status 10/28/2022 FINAL  Final   Organism ID, Bacteria ESCHERICHIA COLI (A)  Final      Susceptibility   Escherichia coli - MIC*    AMPICILLIN >=32 RESISTANT Resistant     CEFAZOLIN <=4 SENSITIVE Sensitive     CEFEPIME <=0.12 SENSITIVE Sensitive     CEFTRIAXONE <=0.25 SENSITIVE Sensitive     CIPROFLOXACIN 1 RESISTANT Resistant     GENTAMICIN <=1 SENSITIVE Sensitive     IMIPENEM <=0.25 SENSITIVE Sensitive     NITROFURANTOIN 64 INTERMEDIATE Intermediate     TRIMETH/SULFA <=20  SENSITIVE Sensitive     AMPICILLIN/SULBACTAM 16 INTERMEDIATE Intermediate     PIP/TAZO <=4 SENSITIVE Sensitive     * >=100,000 COLONIES/mL ESCHERICHIA COLI  Culture, blood (Routine X 2) w Reflex to ID Panel     Status: None (Preliminary result)   Collection Time: 10/27/22  5:06 AM   Specimen: BLOOD  Result Value Ref Range Status   Specimen Description BLOOD BLOOD LEFT HAND  Final   Special Requests   Final    BOTTLES DRAWN AEROBIC AND ANAEROBIC Blood Culture adequate volume   Culture   Final    NO GROWTH 4 DAYS Performed at [facility], [address], [place], [place] [address]    Report Status PENDING  Incomplete  Culture, blood (Routine X 2) w Reflex to ID Panel     Status: None (Preliminary result)   Collection Time: 10/27/22  5:06 AM   Specimen: BLOOD  Result Value Ref Range Status   Specimen Description BLOOD BLOOD LEFT HAND  Final   Special Requests   Final    BOTTLES DRAWN AEROBIC AND ANAEROBIC Blood Culture adequate volume   Culture   Final    NO GROWTH 4 DAYS Performed at [facility], [address], [place], [place] [address]    Report Status PENDING  Incomplete  MRSA Next Gen by PCR, Nasal  Status: None   Collection Time: 10/27/22  5:42 AM   Specimen: Nasal Mucosa; Nasal Swab  Result Value Ref Range Status   MRSA by PCR Next Gen NOT DETECTED NOT DETECTED Final    Comment: (NOTE) The GeneXpert MRSA Assay (FDA approved for NASAL specimens only), is one component of a comprehensive MRSA colonization surveillance program. It is not intended to diagnose MRSA infection nor to guide or monitor treatment for MRSA infections. Test performance is not FDA approved in patients less than 10 years old. Performed at [facility], [address] [place], [place] [address]      Radiology Studies: CT TIBIA FIBULA LEFT W CONTRAST  Result Date: 10/31/2022 CLINICAL DATA:  Osteomyelitis of the tibia/fibula EXAM: CT OF THE LOWER LEFT EXTREMITY WITH CONTRAST  TECHNIQUE: Multidetector CT imaging of the lower left extremity was performed according to the standard protocol following intravenous contrast administration. RADIATION DOSE REDUCTION: This exam was performed according to the departmental dose-optimization program which includes automated exposure control, adjustment of the mA and/or kV according to patient size and/or use of iterative reconstruction technique. CONTRAST:  75mL OMNIPAQUE IOHEXOL 350 MG/ML SOLN COMPARISON:  Ankle radiographs 09/24/2017 FINDINGS: Bones/Joint/Cartilage No bony destructive findings to indicate tibial or fibular osteomyelitis. Markedly severe end-stage degenerative arthropathy in the knee. Small knee joint effusion. Patellar fixators noted. Moderate degenerative arthropathy at the tibiotalar articulation. Ligaments Suboptimally assessed by CT. Anterior drawer sign in the knee suggesting ACL tear/insufficiency. Muscles and Tendons Markedly severe regional muscular atrophy. Soft tissues Atherosclerotic vascular calcifications. Subcutaneous edema along the calf, especially distally, tracking into the dorsum of the foot. Cutaneous irregularity in the anterior distal calf, cannot exclude blistering or underlying wounds. No drainable abscess identified. IMPRESSION: 1. No bony destructive findings to indicate osteomyelitis. 2. Subcutaneous edema along the calf, especially distally, tracking into the dorsum of the foot. 3. Cutaneous irregularity in the anterior distal calf, cannot exclude blistering or underlying wounds. No drainable abscess identified. 4. Markedly severe end-stage degenerative arthropathy in the knee. Small knee joint effusion. 5. Anterior drawer sign in the knee suggesting ACL tear/insufficiency. 6. Markedly severe regional muscular atrophy. 7. Atherosclerosis. Electronically Signed   By: Gaylyn Rong M.D.   On: 10/31/2022 12:09   CT HEAD WO CONTRAST ( )  Result Date: 10/29/2022 CLINICAL DATA:  Altered mental  status EXAM: CT HEAD WITHOUT CONTRAST TECHNIQUE: Contiguous axial images were obtained from the base of the skull through the vertex without intravenous contrast. RADIATION DOSE REDUCTION: This exam was performed according to the departmental dose-optimization program which includes automated exposure control, adjustment of the mA and/or kV according to patient size and/or use of iterative reconstruction technique. COMPARISON:  08/30/2022 FINDINGS: Brain: No evidence of acute infarction, hemorrhage, mass, mass effect, or midline shift. No hydrocephalus or extra-axial fluid collection. Age related cerebral atrophy, unchanged from the prior. Periventricular white matter changes, likely the sequela of chronic small vessel ischemic disease. Vascular: No hyperdense vessel. Skull: Negative for fracture or focal lesion. Sinuses/Orbits: Mucosal thickening in the ethmoid air cells, with an air-fluid level in the posterior right ethmoid air cells. Status post bilateral lens replacements. Other: The mastoid air cells are well aerated. IMPRESSION: No acute intracranial process. Electronically Signed   By: Wiliam Ke M.D.   On: 10/29/2022 21:11   DG Chest 1 View  Result Date: 10/29/2022 CLINICAL DATA:  Dyspnea EXAM: CHEST  1 VIEW COMPARISON:  Chest x-ray 10/27/2022 FINDINGS: Heart is enlarged. There some minimal interstitial opacities in the left  lung base with small left pleural effusion. There is no pneumothorax or acute fracture. Findings are similar to prior. IMPRESSION: Cardiomegaly with minimal interstitial opacities in the left lung base and small left pleural effusion. Electronically Signed   By: Darliss Cheney M.D.   On: 10/29/2022 17:09    Scheduled Meds:  amitriptyline  25 mg Oral QHS   atorvastatin  40 mg Oral Daily   carvedilol  12.5 mg Oral BID WC   Chlorhexidine Gluconate Cloth  6 each Topical Daily   enoxaparin (LOVENOX) injection  40 mg Subcutaneous Q24H   feeding supplement  237 mL Oral BID BM    gabapentin  100 mg Oral QHS   insulin aspart  0-20 Units Subcutaneous TID WC   insulin aspart  0-5 Units Subcutaneous QHS   irbesartan  300 mg Oral Daily   levothyroxine  100 mcg Oral Q0600   lidocaine  1 patch Transdermal Q24H   mirabegron ER  50 mg Oral QODAY   multivitamin with minerals  1 tablet Per Tube Daily   pantoprazole  40 mg Oral Daily   polyethylene glycol  17 g Oral Daily   QUEtiapine  25 mg Oral Q24H   senna  1 tablet Oral BID   zolpidem  5 mg Oral QHS   Continuous Infusions:  sodium chloride Stopped (10/30/22 1500)   cefTRIAXone (ROCEPHIN)  IV       LOS: 6 days   Hughie Closs, MD [facility]  10/31/2022, 12:43 PM   *Please note that this is a verbal dictation therefore any spelling or grammatical errors are due to the "Dragon Medical One" system interpretation.  Please page via Amion and do not message via secure chat for urgent patient care matters. Secure chat can be used for non urgent patient care matters.  How to contact the [facility] Attending or Consulting provider 7A - 7P or covering provider during after hours 7P -7A, for this patient?  Check the care team in [facility] and look for a) attending/consulting [facility] provider listed and b) the [facility] team listed. Page or secure chat 7A-7P. Log into [email or web address] and use [facility]'s universal password to access. If you do not have the password, please contact the hospital operator. Locate the [facility] provider you are looking for under [facility] and page to a number that you can be directly reached. If you still have difficulty reaching the provider, please page the [facility] (Director on Call) for the Hospitalists listed on amion for assistance.

## 2022-10-31 NOTE — Progress Notes (Signed)
Patient was taken for CT via bed at 0730 AM.    Patient's belongings shifted to the Room 35 as Room 37 needs maintenance. Spouse is at bedside and made him aware.  Patient will be taken to room 35 after CT.

## 2022-10-31 NOTE — Consult Note (Signed)
WOC Nurse Consult Note: Reason for Consult:Left LE wound Wound type:Trauma Pressure Injury POA: N/A  Consult request for wound care guidance for LLE wound. Patient was seen by my associate M. Austin for this wound on 10/26/22.  See note from that Encounter. Orders for topical care are current and active.  WOC nursing team will not follow, but will remain available to this patient, the nursing and medical teams.  Please re-consult if needed.  Thank you for inviting [place] to participate in this patient's Plan of Care.  Ladona Mow, MSN, RN, CNS, GNP, Leda Min, [employer], [employer] phone:  [phone]

## 2022-10-31 NOTE — Progress Notes (Signed)
Patient is back on the floor after CT  

## 2022-11-01 DIAGNOSIS — N3000 Acute cystitis without hematuria: Secondary | ICD-10-CM | POA: Diagnosis not present

## 2022-11-01 LAB — CULTURE, BLOOD (ROUTINE X 2): Culture: NO GROWTH

## 2022-11-01 LAB — GLUCOSE, CAPILLARY: Glucose-Capillary: 140 mg/dL — ABNORMAL HIGH (ref 70–99)

## 2022-11-01 NOTE — Discharge Summary (Signed)
Physician Discharge Summary  [name] ZOX:[id number] DOB: 29-Mar-1946 DOA: 10/25/2022  PCP: Charlane Ferretti, DO  Admit date: 10/25/2022 Discharge date: 11/01/2022 30 Day Unplanned Readmission Risk Score    Flowsheet Row ED to Hosp-Admission (Current) from 10/25/2022 in [place] [facility]  30 Day Unplanned Readmission Risk Score (%) 25.59 Filed at 11/01/2022 0801       This score is the patient's risk of an unplanned readmission within 30 days of being discharged (0 -100%). The score is based on dignosis, age, lab data, medications, orders, and past utilization.   Low:  0-14.9   Medium: 15-21.9   High: 22-29.9   Extreme: 30 and above          Admitted From: Home Disposition: Home  Recommendations for Outpatient Follow-up:  Follow up with PCP in 1-2 weeks Please obtain BMP/CBC in one week Please follow up with your PCP on the following pending results: Unresulted Labs (From admission, onward)    None         Home Health: Yes Equipment/Devices: None  Discharge Condition: Stable CODE STATUS: Full code  Diet recommendation: Cardiac  Subjective: Seen and examined.  No complaints.  Both daughters at the bedside.  Several questions asked which were addressed.  They are in agreement with the discharge plan today.  Brief/Interim Summary: 77 year old female with history of hypertension, wheelchair-bound secondary to polio, hypothyroidism, nephrolithiasis, chronic back pain, GERD who presented with right knee pain.  Patient was also recently diagnosed with UTI and was on Macrobid.  Patient was having dysuria on presentation.  On presentation, she was hemodynamically stable but had soft blood pressure.  Labs showed sodium of 126.  X-ray of the pelvis and right leg did not show any fracture or dislocation.  Urinalysis showed  positive nitrates, many bacteria.Urine culture showing Ecoli. Started on IV antibiotic.  Patient became hypotensive, febrile, confused, hyponatremia  worsened.  PCCM consulted, transferred to ICU.  Found to have altered mental status due to sepsis.  Also had septic shock from E. coli and Aerococcus urinary UTI, required pressor support and now weaned off. Patient transferred to [facility] service on 10/29/2022.   Septic shock secondary to UTI: Some details above.  Urine culture growing E. coli which is sensitive to cephalosporins, patient has received 9 days of Rocephin.  Septic shock has resolved.   Acute metabolic encephalopathy/acute delirium: Likely secondary to septic shock and UTI.  Now resolved with treatment with Seroquel.  She is fully alert and oriented.  Patient appears to be using Klonopin at home which we have been holding for 9 days and patient has not required any medication for anxiety.  I will discontinue Klonopin to further exacerbate or cause delirium at home.  I do think that she may have mild degree of dementia which needs further evaluation by neurology.  Discussed with the family in detail.  They will follow-up with PCP and neurology.   left leg wound/cellulitis: CT leg negative for osteomyelitis or abscess.  Visibly there is no erythema/cellulitis.  There is wound which is healing.  She was seen by wound care.  Wound care recommendations are in place.  We will arrange home health care RN for her.  She does not need any further antibiotics.   Hyponatremia: Baseline sodium in the 130s.  Patient was taking diuretics at home.  She presented with sodium of 122, started on gentle IV fluids.,  Sodium improved to 133.  She is on very low-dose of torsemide which  we will resume.   Diabetes type 2: Appears well-controlled on metformin.     Hypertension: Hypotensive on presentation requiring vasopressors.  Blood pressure started to rise, antihypertensives slowly resumed, currently on half dose of Coreg which is 12.5 mg p.o. twice daily and blood pressure slightly elevated.  Will resume full dose and hopefully that will help with the blood  pressure.    Hyperlipidemia: On Lipitor   Hypothyroidism: On Synthyroid   History of polio/wheelchair-bound/debility: On gabapentin, Zanaflex.  Patient is nonambulatory.   Obesity: BMI 33.2  Discharge plan was discussed with patient and/or family member and they verbalized understanding and agreed with it.  Discharge Diagnoses:  Principal Problem:   UTI (urinary tract infection) Active Problems:   Right knee pain   Hyponatremia   Leg wound, left   Controlled type 2 diabetes mellitus without complication, without long-term current use of insulin (HCC)   Essential hypertension   Hyperlipidemia   Hypothyroidism   Post-polio syndrome   Wheelchair dependence   Obesity (BMI 30-39.9)   Sepsis due to Escherichia coli with encephalopathy and septic shock (HCC)   Septic shock (HCC)   Encephalopathy acute    Discharge Instructions   Allergies as of 11/01/2022       Reactions   Tamsulosin Other (See Comments)   Caused uncontrollable movements   Ciprofloxacin Rash   Carisprodol [carisoprodol] Itching   Penicillins Itching   Sulfa Antibiotics Other (See Comments)   Unknown.  States as always told had an allergy   Acetaminophen Itching   Macrodantin [nitrofurantoin Macrocrystal] Rash   Nsaids Other (See Comments)   Chest pain and stomach pain   Percocet [oxycodone-acetaminophen] Itching   Propoxyphene Other (See Comments)   Chest/stomach pain   Propoxyphene N-acetaminophen Other (See Comments)   Chest/stomach pain        Medication List     STOP taking these medications    spironolactone 25 MG tablet Commonly known as: Aldactone   traMADol 50 MG tablet Commonly known as: ULTRAM       TAKE these medications    amitriptyline 25 MG tablet Commonly known as: ELAVIL Take 25 mg by mouth at bedtime.   ascorbic acid 500 MG tablet Commonly known as: VITAMIN C Take 500 mg by mouth daily.   aspirin 81 MG tablet Take 81 mg by mouth at bedtime.   atorvastatin 40  MG tablet Commonly known as: LIPITOR Take 40 mg by mouth daily.   b complex vitamins tablet Take 1 tablet by mouth daily.   carvedilol 25 MG tablet Commonly known as: COREG Take 25 mg by mouth 2 (two) times daily with a meal.   clonazePAM 0.5 MG tablet Commonly known as: KLONOPIN Take 0.5 mg by mouth 2 (two) times daily.   gabapentin 300 MG capsule Commonly known as: NEURONTIN Take 300 mg by mouth at bedtime.   Iron (Ferrous Sulfate) 325 (65 Fe) MG Tabs Take 1 tablet by mouth daily.   levothyroxine 100 MCG tablet Commonly known as: SYNTHROID Take 100 mcg by mouth daily before breakfast.   loratadine 10 MG tablet Commonly known as: CLARITIN Take 10 mg by mouth daily.   metFORMIN 500 MG 24 hr tablet Commonly known as: GLUCOPHAGE-XR Take 1 tablet by mouth in the morning and at bedtime.   mirabegron ER 50 MG Tb24 tablet Commonly known as: MYRBETRIQ Take 50 mg by mouth daily.   multivitamin with minerals tablet Take 1 tablet by mouth daily.   nitroGLYCERIN 0.4 MG SL tablet  Commonly known as: NITROSTAT Place 1 tablet (0.4 mg total) under the tongue every 5 (five) minutes as needed for chest pain.   pantoprazole 40 MG tablet Commonly known as: PROTONIX Take 1 tablet by mouth daily. Take 1 tab daily   telmisartan 80 MG tablet Commonly known as: MICARDIS Take 1 tablet by mouth daily. Take 1 tab daily   tiZANidine 4 MG tablet Commonly known as: ZANAFLEX Take 4 mg by mouth 5 (five) times daily as needed for muscle spasms. PAIN   torsemide 5 MG tablet Commonly known as: DEMADEX Take 5 mg by mouth daily.   Vitamin D (Ergocalciferol) 1.25 MG (50000 UNIT) Caps capsule Commonly known as: DRISDOL Take 50,000 Units by mouth every 7 (seven) days.   vitamin E 180 MG (400 UNITS) capsule Take 400 Units by mouth daily.   Zinc 50 MG Tabs Take 50 mg by mouth daily.   zolpidem 5 MG tablet Commonly known as: AMBIEN Take 5 mg by mouth at bedtime as needed for sleep.         Follow-up Information     [facility] Follow up.   Specialty: Home Health Services Why: [facility] will be providing home health services.  They will call you in the next 24-48 hours to set up services. Contact information: [address] [place] [address] [phone]         Charlane Ferretti, DO Follow up in 1 week(s).   Specialty: Internal Medicine Contact information: [address] [place] [address] [phone]                Allergies  Allergen Reactions   Tamsulosin Other (See Comments)    Caused uncontrollable movements   Ciprofloxacin Rash   Carisprodol [Carisoprodol] Itching   Penicillins Itching   Sulfa Antibiotics Other (See Comments)    Unknown.  States as always told had an allergy   Acetaminophen Itching   Macrodantin [Nitrofurantoin Macrocrystal] Rash   Nsaids Other (See Comments)    Chest pain and stomach pain   Percocet [Oxycodone-Acetaminophen] Itching   Propoxyphene Other (See Comments)    Chest/stomach pain   Propoxyphene N-Acetaminophen Other (See Comments)    Chest/stomach pain    Consultations: PCCM   Procedures/Studies: CT TIBIA FIBULA LEFT W CONTRAST  Result Date: 10/31/2022 CLINICAL DATA:  Osteomyelitis of the tibia/fibula EXAM: CT OF THE LOWER LEFT EXTREMITY WITH CONTRAST TECHNIQUE: Multidetector CT imaging of the lower left extremity was performed according to the standard protocol following intravenous contrast administration. RADIATION DOSE REDUCTION: This exam was performed according to the departmental dose-optimization program which includes automated exposure control, adjustment of the mA and/or kV according to patient size and/or use of iterative reconstruction technique. CONTRAST:  75mL OMNIPAQUE IOHEXOL 350 MG/ML SOLN COMPARISON:  Ankle radiographs 09/24/2017 FINDINGS: Bones/Joint/Cartilage No bony destructive findings to indicate tibial or fibular osteomyelitis. Markedly severe  end-stage degenerative arthropathy in the knee. Small knee joint effusion. Patellar fixators noted. Moderate degenerative arthropathy at the tibiotalar articulation. Ligaments Suboptimally assessed by CT. Anterior drawer sign in the knee suggesting ACL tear/insufficiency. Muscles and Tendons Markedly severe regional muscular atrophy. Soft tissues Atherosclerotic vascular calcifications. Subcutaneous edema along the calf, especially distally, tracking into the dorsum of the foot. Cutaneous irregularity in the anterior distal calf, cannot exclude blistering or underlying wounds. No drainable abscess identified. IMPRESSION: 1. No bony destructive findings to indicate osteomyelitis. 2. Subcutaneous edema along the calf, especially distally, tracking into the dorsum of the foot. 3.  Cutaneous irregularity in the anterior distal calf, cannot exclude blistering or underlying wounds. No drainable abscess identified. 4. Markedly severe end-stage degenerative arthropathy in the knee. Small knee joint effusion. 5. Anterior drawer sign in the knee suggesting ACL tear/insufficiency. 6. Markedly severe regional muscular atrophy. 7. Atherosclerosis. Electronically Signed   By: Gaylyn Rong M.D.   On: 10/31/2022 12:09   CT HEAD WO CONTRAST ( )  Result Date: 10/29/2022 CLINICAL DATA:  Altered mental status EXAM: CT HEAD WITHOUT CONTRAST TECHNIQUE: Contiguous axial images were obtained from the base of the skull through the vertex without intravenous contrast. RADIATION DOSE REDUCTION: This exam was performed according to the departmental dose-optimization program which includes automated exposure control, adjustment of the mA and/or kV according to patient size and/or use of iterative reconstruction technique. COMPARISON:  08/30/2022 FINDINGS: Brain: No evidence of acute infarction, hemorrhage, mass, mass effect, or midline shift. No hydrocephalus or extra-axial fluid collection. Age related cerebral atrophy, unchanged  from the prior. Periventricular white matter changes, likely the sequela of chronic small vessel ischemic disease. Vascular: No hyperdense vessel. Skull: Negative for fracture or focal lesion. Sinuses/Orbits: Mucosal thickening in the ethmoid air cells, with an air-fluid level in the posterior right ethmoid air cells. Status post bilateral lens replacements. Other: The mastoid air cells are well aerated. IMPRESSION: No acute intracranial process. Electronically Signed   By: Wiliam Ke M.D.   On: 10/29/2022 21:11   DG Chest 1 View  Result Date: 10/29/2022 CLINICAL DATA:  Dyspnea EXAM: CHEST  1 VIEW COMPARISON:  Chest x-ray 10/27/2022 FINDINGS: Heart is enlarged. There some minimal interstitial opacities in the left lung base with small left pleural effusion. There is no pneumothorax or acute fracture. Findings are similar to prior. IMPRESSION: Cardiomegaly with minimal interstitial opacities in the left lung base and small left pleural effusion. Electronically Signed   By: Darliss Cheney M.D.   On: 10/29/2022 17:09   VAS [place] LOWER EXTREMITY VENOUS (DVT)  Result Date: 10/28/2022  Lower Venous DVT Study Patient Name:  [name]  Date of Exam:   10/27/2022 Medical Rec #: [id number]      Accession #:    [id number] Date of Birth: 1945-05-29      Patient Gender: F Patient Age:   77 years Exam Location:  [facility] Procedure:      VAS [place] LOWER EXTREMITY VENOUS (DVT) Referring Phys: Zenia Resides --------------------------------------------------------------------------------  Indications: Swelling.  Risk Factors: None identified. Limitations: Body habitus and poor ultrasound/tissue interface. Comparison Study: No prior studies. Performing Technologist: Chanda Busing RVT  Examination Guidelines: A complete evaluation includes B-mode imaging, spectral Doppler, color Doppler, and power Doppler as needed of all accessible portions of each vessel. Bilateral testing is considered an integral part of a  complete examination. Limited examinations for reoccurring indications may be performed as noted. The reflux portion of the exam is performed with the patient in reverse Trendelenburg.  +---------+---------------+---------+-----------+----------+--------------+ RIGHT    CompressibilityPhasicitySpontaneityPropertiesThrombus Aging +---------+---------------+---------+-----------+----------+--------------+ CFV      Full           Yes      Yes                                 +---------+---------------+---------+-----------+----------+--------------+ SFJ      Full                                                        +---------+---------------+---------+-----------+----------+--------------+  FV Prox  Full                                                        +---------+---------------+---------+-----------+----------+--------------+ FV Mid   Full           Yes      Yes                                 +---------+---------------+---------+-----------+----------+--------------+ FV Distal               Yes      Yes                                 +---------+---------------+---------+-----------+----------+--------------+ PFV      Full                                                        +---------+---------------+---------+-----------+----------+--------------+ POP      Full           Yes      Yes                                 +---------+---------------+---------+-----------+----------+--------------+ PTV      Full                                                        +---------+---------------+---------+-----------+----------+--------------+ PERO     Full                                                        +---------+---------------+---------+-----------+----------+--------------+   +----+---------------+---------+-----------+----------+--------------+ LEFTCompressibilityPhasicitySpontaneityPropertiesThrombus Aging  +----+---------------+---------+-----------+----------+--------------+ CFV Full           Yes      Yes                                 +----+---------------+---------+-----------+----------+--------------+     Summary: RIGHT: - There is no evidence of deep vein thrombosis in the lower extremity. However, portions of this examination were limited- see technologist comments above.  - No cystic structure found in the popliteal fossa.  LEFT: - No evidence of common femoral vein obstruction.  *See table(s) above for measurements and observations. Electronically signed by Heath Lark on 10/28/2022 at 6:07:22 PM.    Final    CT ABDOMEN PELVIS W CONTRAST  Result Date: 10/27/2022 CLINICAL DATA:  Acute abdominal pain with hypotension. EXAM: CT ABDOMEN AND PELVIS WITH CONTRAST TECHNIQUE: Multidetector CT imaging of the abdomen and pelvis was performed using the standard protocol following bolus administration of intravenous contrast. RADIATION DOSE REDUCTION: This exam  was performed according to the departmental dose-optimization program which includes automated exposure control, adjustment of the mA and/or kV according to patient size and/or use of iterative reconstruction technique. CONTRAST:  75mL OMNIPAQUE IOHEXOL 350 MG/ML SOLN COMPARISON:  CT abdomen and pelvis 06/04/2022 FINDINGS: Lower chest: There is atelectasis in the lung bases. Hepatobiliary: No focal liver abnormality is seen. Status post cholecystectomy. No biliary dilatation. Pancreas: Unremarkable. No pancreatic ductal dilatation or surrounding inflammatory changes. Spleen: Normal in size without focal abnormality. Adrenals/Urinary Tract: The bladder is decompressed by Foley catheter. The kidneys and adrenal glands are within normal limits. Stomach/Bowel: Stomach is within normal limits. Appendix is not seen. No evidence of bowel wall thickening, distention, or inflammatory changes. There are few scattered colonic diverticula Vascular/Lymphatic:  Aortic atherosclerosis. No enlarged abdominal or pelvic lymph nodes. Reproductive: Status post hysterectomy. No adnexal masses. Other: There is trace free fluid in the right lower quadrant. There is mild body wall edema. There is a small amount of subcutaneous air in the left anterior abdominal wall likely related to medication injection site. Musculoskeletal: There is levoconvex curvature of the lumbar spine. IMPRESSION: 1. Trace free fluid in the right lower quadrant. 2. Mild body wall edema. 3. Colonic diverticulosis without evidence for diverticulitis. Aortic Atherosclerosis (ICD10-I70.0). Electronically Signed   By: Darliss Cheney M.D.   On: 10/27/2022 17:35   DG CHEST PORT 1 VIEW  Result Date: 10/27/2022 CLINICAL DATA:  [id number] with fever. EXAM: PORTABLE CHEST 1 VIEW COMPARISON:  Portable chest 08/30/2022 FINDINGS: 4:48 a.m. The heart is enlarged. There are no findings of acute CHF. The mediastinum is stable. There is aortic tortuosity and atherosclerosis. There is no substantial pleural effusion. There is a low inspiration with increased opacity in the right infrahilar area and retrocardiac left base which could be due to atelectasis or pneumonia. The remaining lungs are clear, with chronic mild elevation of the right diaphragm. There is osteopenia, dextroscoliosis and degenerative change of the spine and shoulders. IMPRESSION: 1. Increased opacity in the right infrahilar area and retrocardiac left base which could be due to atelectasis or pneumonia. 2. Low inspiration study. Follow-up study recommended in full inspiration. 3. Cardiomegaly without evidence of acute CHF. 4. Aortic atherosclerosis. Electronically Signed   By: Almira Bar M.D.   On: 10/27/2022 05:19   DG Pelvis 1-2 Views  Result Date: 10/25/2022 CLINICAL DATA:  [id number] with leg pain for 2 weeks. EXAM: PELVIS - 1-2 VIEW COMPARISON:  CT abdomen and pelvis and reconstructions 06/04/2022 FINDINGS: There is no evidence of pelvic fracture or  diastasis. No pelvic bone lesions are seen. There is osteopenia and mild-to-moderate joint space loss of both hips, spurring of the SI joints and pubic symphysis. There are degenerative changes of the visualized lower lumbar spine with partially visible lumbar levoscoliosis. Calcifications are again noted in the internal iliac arteries. Compare: Stable radiographically. IMPRESSION: 1. Osteopenia and degenerative change without evidence of fractures. 2. Vascular calcifications. 3. Partially visible lumbar levoscoliosis and degenerative changes. Electronically Signed   By: Almira Bar M.D.   On: 10/25/2022 03:26   DG Femur Min 2 Views Right  Result Date: 10/25/2022 CLINICAL DATA:  Leg pain EXAM: RIGHT FEMUR 2 VIEWS COMPARISON:  None Available. FINDINGS: There is no evidence of fracture or other focal bone lesions. Soft tissues are unremarkable. IMPRESSION: Negative. Electronically Signed   By: Charlett Nose M.D.   On: 10/25/2022 03:22     Discharge Exam: Vitals:   10/31/22 2050 11/01/22 0809  BP:  Marland Kitchen)  189/75  Pulse: 83 69  Resp:  16  Temp:  97.6 F (36.4 C)  SpO2: 98% 96%   Vitals:   10/31/22 1736 10/31/22 2045 10/31/22 2050 11/01/22 0809  BP: (!) 157/79 (!) 158/94  (!) 189/75  Pulse: 69 73 83 69  Resp:  16  16  Temp: (!) 97.4 F (36.3 C) 98.4 F (36.9 C)  97.6 F (36.4 C)  TempSrc:    Oral  SpO2: 100% (!) 81% 98% 96%  Weight:      Height:        General: Pt is alert, awake, not in acute distress Cardiovascular: RRR, S1/S2 +, no rubs, no gallops Respiratory: CTA bilaterally, no wheezing, no rhonchi Abdominal: Soft, NT, ND, bowel sounds + Extremities: +2 pitting edema bilateral lower extremity with wound on the left anterior shin which has no signs of infection.  Patient has known polio.  She is bedbound.    The results of significant diagnostics from this hospitalization (including imaging, microbiology, ancillary and laboratory) are listed below for reference.      Microbiology: Recent Results (from the past 240 hour(s))  Urine Culture (for pregnant, neutropenic or urologic patients or patients with an indwelling urinary catheter)     Status: Abnormal   Collection Time: 10/25/22  5:33 AM   Specimen: In/Out Cath Urine  Result Value Ref Range Status   Specimen Description IN/OUT CATH URINE  Final   Special Requests   Final    NONE Performed at [facility], [address] [place], [place] [address]    Culture (A)  Final    >=100,000 COLONIES/mL ESCHERICHIA COLI >=100,000 COLONIES/mL AEROCOCCUS URINAE    Report Status 10/28/2022 FINAL  Final   Organism ID, Bacteria ESCHERICHIA COLI (A)  Final      Susceptibility   Escherichia coli - MIC*    AMPICILLIN >=32 RESISTANT Resistant     CEFAZOLIN <=4 SENSITIVE Sensitive     CEFEPIME <=0.12 SENSITIVE Sensitive     CEFTRIAXONE <=0.25 SENSITIVE Sensitive     CIPROFLOXACIN 1 RESISTANT Resistant     GENTAMICIN <=1 SENSITIVE Sensitive     IMIPENEM <=0.25 SENSITIVE Sensitive     NITROFURANTOIN 64 INTERMEDIATE Intermediate     TRIMETH/SULFA <=20 SENSITIVE Sensitive     AMPICILLIN/SULBACTAM 16 INTERMEDIATE Intermediate     PIP/TAZO <=4 SENSITIVE Sensitive     * >=100,000 COLONIES/mL ESCHERICHIA COLI  Culture, blood (Routine X 2) w Reflex to ID Panel     Status: None   Collection Time: 10/27/22  5:06 AM   Specimen: BLOOD  Result Value Ref Range Status   Specimen Description BLOOD BLOOD LEFT HAND  Final   Special Requests   Final    BOTTLES DRAWN AEROBIC AND ANAEROBIC Blood Culture adequate volume   Culture   Final    NO GROWTH 5 DAYS Performed at [facility], [address] [place], [place] [address]    Report Status 11/01/2022 FINAL  Final  Culture, blood (Routine X 2) w Reflex to ID Panel     Status: None   Collection Time: 10/27/22  5:06 AM   Specimen: BLOOD  Result Value Ref Range Status   Specimen Description BLOOD BLOOD LEFT HAND  Final   Special Requests   Final     BOTTLES DRAWN AEROBIC AND ANAEROBIC Blood Culture adequate volume   Culture   Final    NO GROWTH 5 DAYS Performed at [facility], [address],  [place], [place] [address]    Report Status 11/01/2022 FINAL  Final  MRSA Next Gen by PCR, Nasal     Status: None   Collection Time: 10/27/22  5:42 AM   Specimen: Nasal Mucosa; Nasal Swab  Result Value Ref Range Status   MRSA by PCR Next Gen NOT DETECTED NOT DETECTED Final    Comment: (NOTE) The GeneXpert MRSA Assay (FDA approved for NASAL specimens only), is one component of a comprehensive MRSA colonization surveillance program. It is not intended to diagnose MRSA infection nor to guide or monitor treatment for MRSA infections. Test performance is not FDA approved in patients less than 64 years old. Performed at [facility], [address], [place], [place] [address]      Labs: BNP (last 3 results) Recent Labs    06/02/22 1217 10/27/22 0544  BNP 62.2 49.7   Basic Metabolic Panel: Recent Labs  Lab 10/27/22 0506 10/27/22 1137 10/27/22 1138 10/27/22 1556 10/27/22 2224 10/28/22 0428 10/28/22 1050 10/28/22 1621 10/29/22 0122 10/31/22 0437  NA 122*   < > 120* 122*   < > 131* 129* 128* 129* 133*  K 3.8   < > 4.0 4.0  --  3.3*  --   --  4.4 4.0  CL 90*   < > 91* 93*  --  100  --   --  100 101  CO2 20*   < > 19* 18*  --  21*  --   --  18* 24  GLUCOSE 174*   < > 232* 265*  --  179*  --   --  149* 140*  BUN 8   < > 12 12  --  9  --   --  14 7*  CREATININE 0.63   < > 0.73 0.60  --  0.44  --   --  0.62 0.47  CALCIUM 8.8*   < > 8.1* 8.2*  --  8.4*  --   --  8.1* 8.6*  MG 0.9*  --   --   --   --  1.7  --   --   --   --   PHOS 2.9  --   --   --   --   --   --   --   --   --    < > = values in this interval not displayed.   Liver Function Tests: Recent Labs  Lab 10/27/22 0506  AST 31  ALT 20  ALKPHOS 71  BILITOT 1.1  PROT 6.6  ALBUMIN 2.9*   No results for input(s): "LIPASE", "AMYLASE" in the last 168  hours. No results for input(s): "AMMONIA" in the last 168 hours. CBC: Recent Labs  Lab 10/26/22 0833 10/27/22 0544 10/29/22 0122 10/31/22 0437  WBC 13.2* 17.7* 12.0* 11.1*  NEUTROABS  --  13.6*  --  5.9  HGB 12.0 11.8* 10.6* 10.8*  HCT 36.6 34.3* 32.8* 33.3*  MCV 97.1 90.0 95.1 93.5  PLT 470* 442* 400 432*   Cardiac Enzymes: No results for input(s): "CKTOTAL", "CKMB", "CKMBINDEX", "TROPONINI" in the last 168 hours. BNP: Invalid input(s): "POCBNP" CBG: Recent Labs  Lab 10/31/22 0821 10/31/22 1215 10/31/22 1812 10/31/22 2048 11/01/22 0812  GLUCAP 134* 176* 166* 142* 140*   D-Dimer No results for input(s): "DDIMER" in the last 72 hours. Hgb A1c No results for input(s): "HGBA1C" in the last 72 hours. Lipid Profile No results for input(s): "CHOL", "HDL", "LDLCALC", "TRIG", "CHOLHDL", "LDLDIRECT" in the  last 72 hours. Thyroid function studies No results for input(s): "TSH", "T4TOTAL", "T3FREE", "THYROIDAB" in the last 72 hours.  Invalid input(s): "FREET3" Anemia work up No results for input(s): "VITAMINB12", "FOLATE", "FERRITIN", "TIBC", "IRON", "RETICCTPCT" in the last 72 hours. Urinalysis    Component Value Date/Time   COLORURINE YELLOW 10/25/2022 0533   APPEARANCEUR CLOUDY (A) 10/25/2022 0533   LABSPEC 1.006 10/25/2022 0533   PHURINE 5.0 10/25/2022 0533   GLUCOSEU NEGATIVE 10/25/2022 0533   HGBUR SMALL (A) 10/25/2022 0533   BILIRUBINUR NEGATIVE 10/25/2022 0533   BILIRUBINUR negative 09/28/2020 1544   KETONESUR NEGATIVE 10/25/2022 0533   PROTEINUR NEGATIVE 10/25/2022 0533   UROBILINOGEN 0.2 09/28/2020 1544   UROBILINOGEN 0.2 09/01/2013 2145   NITRITE POSITIVE (A) 10/25/2022 0533   LEUKOCYTESUR LARGE (A) 10/25/2022 0533   Sepsis Labs Recent Labs  Lab 10/26/22 0833 10/27/22 0544 10/29/22 0122 10/31/22 0437  WBC 13.2* 17.7* 12.0* 11.1*   Microbiology Recent Results (from the past 240 hour(s))  Urine Culture (for pregnant, neutropenic or urologic  patients or patients with an indwelling urinary catheter)     Status: Abnormal   Collection Time: 10/25/22  5:33 AM   Specimen: In/Out Cath Urine  Result Value Ref Range Status   Specimen Description IN/OUT CATH URINE  Final   Special Requests   Final    NONE Performed at [facility], [address] [place], [place] [address]    Culture (A)  Final    >=100,000 COLONIES/mL ESCHERICHIA COLI >=100,000 COLONIES/mL AEROCOCCUS URINAE    Report Status 10/28/2022 FINAL  Final   Organism ID, Bacteria ESCHERICHIA COLI (A)  Final      Susceptibility   Escherichia coli - MIC*    AMPICILLIN >=32 RESISTANT Resistant     CEFAZOLIN <=4 SENSITIVE Sensitive     CEFEPIME <=0.12 SENSITIVE Sensitive     CEFTRIAXONE <=0.25 SENSITIVE Sensitive     CIPROFLOXACIN 1 RESISTANT Resistant     GENTAMICIN <=1 SENSITIVE Sensitive     IMIPENEM <=0.25 SENSITIVE Sensitive     NITROFURANTOIN 64 INTERMEDIATE Intermediate     TRIMETH/SULFA <=20 SENSITIVE Sensitive     AMPICILLIN/SULBACTAM 16 INTERMEDIATE Intermediate     PIP/TAZO <=4 SENSITIVE Sensitive     * >=100,000 COLONIES/mL ESCHERICHIA COLI  Culture, blood (Routine X 2) w Reflex to ID Panel     Status: None   Collection Time: 10/27/22  5:06 AM   Specimen: BLOOD  Result Value Ref Range Status   Specimen Description BLOOD BLOOD LEFT HAND  Final   Special Requests   Final    BOTTLES DRAWN AEROBIC AND ANAEROBIC Blood Culture adequate volume   Culture   Final    NO GROWTH 5 DAYS Performed at [facility], [address] [place], [place] [address]    Report Status 11/01/2022 FINAL  Final  Culture, blood (Routine X 2) w Reflex to ID Panel     Status: None   Collection Time: 10/27/22  5:06 AM   Specimen: BLOOD  Result Value Ref Range Status   Specimen Description BLOOD BLOOD LEFT HAND  Final   Special Requests   Final    BOTTLES DRAWN AEROBIC AND ANAEROBIC Blood Culture adequate volume   Culture   Final    NO GROWTH 5 DAYS Performed at  [facility], [address], [place], [place] [address]    Report Status 11/01/2022 FINAL  Final  MRSA Next Gen by PCR, Nasal     Status:  None   Collection Time: 10/27/22  5:42 AM   Specimen: Nasal Mucosa; Nasal Swab  Result Value Ref Range Status   MRSA by PCR Next Gen NOT DETECTED NOT DETECTED Final    Comment: (NOTE) The GeneXpert MRSA Assay (FDA approved for NASAL specimens only), is one component of a comprehensive MRSA colonization surveillance program. It is not intended to diagnose MRSA infection nor to guide or monitor treatment for MRSA infections. Test performance is not FDA approved in patients less than 79 years old. Performed at [facility], [address] [place], [place] [address]     FURTHER DISCHARGE INSTRUCTIONS:   Get Medicines reviewed and adjusted: Please take all your medications with you for your next visit with your Primary MD   Laboratory/radiological data: Please request your Primary MD to go over all hospital tests and procedure/radiological results at the follow up, please ask your Primary MD to get all Hospital records sent to his/her office.   In some cases, they will be blood work, cultures and biopsy results pending at the time of your discharge. Please request that your primary care M.D. goes through all the records of your hospital data and follows up on these results.   Also Note the following: If you experience worsening of your admission symptoms, develop shortness of breath, life threatening emergency, suicidal or homicidal thoughts you must seek medical attention immediately by calling 911 or calling your MD immediately  if symptoms less severe.   You must read complete instructions/literature along with all the possible adverse reactions/side effects for all the Medicines you take and that have been prescribed to you. Take any new Medicines after you have completely understood and accpet all the possible adverse reactions/side  effects.    Do not drive when taking Pain medications or sleeping medications (Benzodaizepines)   Do not take more than prescribed Pain, Sleep and Anxiety Medications. It is not advisable to combine anxiety,sleep and pain medications without talking with your primary care practitioner   Special Instructions: If you have smoked or chewed Tobacco  in the last 2 yrs please stop smoking, stop any regular Alcohol  and or any Recreational drug use.   Wear Seat belts while driving.   Please note: You were cared for by a hospitalist during your hospital stay. Once you are discharged, your primary care physician will handle any further medical issues. Please note that NO REFILLS for any discharge medications will be authorized once you are discharged, as it is imperative that you return to your primary care physician (or establish a relationship with a primary care physician if you do not have one) for your post hospital discharge needs so that they can reassess your need for medications and monitor your lab values  Time coordinating discharge: Over 30 minutes  SIGNED:   Hughie Closs, MD  [facility] 11/01/2022, 9:37 AM *Please note that this is a verbal dictation therefore any spelling or grammatical errors are due to the "[employer]" system interpretation. If 7PM-7AM, please contact night-coverage [email or web address]

## 2022-11-10 ENCOUNTER — Emergency Department (HOSPITAL_COMMUNITY)
Admission: EM | Admit: 2022-11-10 | Discharge: 2022-11-11 | Disposition: A | Discharge status: Home / Self Care | Payer: Medicare Other | Attending: Emergency Medicine | Admitting: Emergency Medicine

## 2022-11-10 ENCOUNTER — Encounter (HOSPITAL_COMMUNITY): Payer: Self-pay

## 2022-11-10 ENCOUNTER — Other Ambulatory Visit: Payer: Self-pay

## 2022-11-10 DIAGNOSIS — I7090 Unspecified atherosclerosis: Secondary | ICD-10-CM | POA: Diagnosis not present

## 2022-11-10 DIAGNOSIS — R531 Weakness: Secondary | ICD-10-CM | POA: Insufficient documentation

## 2022-11-10 DIAGNOSIS — R197 Diarrhea, unspecified: Secondary | ICD-10-CM | POA: Insufficient documentation

## 2022-11-10 DIAGNOSIS — R1030 Lower abdominal pain, unspecified: Secondary | ICD-10-CM | POA: Insufficient documentation

## 2022-11-10 DIAGNOSIS — Z7982 Long term (current) use of aspirin: Secondary | ICD-10-CM | POA: Insufficient documentation

## 2022-11-10 DIAGNOSIS — R600 Localized edema: Secondary | ICD-10-CM | POA: Diagnosis not present

## 2022-11-10 LAB — CBC
HCT: 44.9 % (ref 36.0–46.0)
Hemoglobin: 13.6 g/dL (ref 12.0–15.0)
MCH: 30.6 pg (ref 26.0–34.0)
MCHC: 30.3 g/dL (ref 30.0–36.0)
MCV: 100.9 fL — ABNORMAL HIGH (ref 80.0–100.0)
Platelets: 529 10*3/uL — ABNORMAL HIGH (ref 150–400)
RBC: 4.45 MIL/uL (ref 3.87–5.11)
RDW: 14 % (ref 11.5–15.5)
WBC: 17.3 10*3/uL — ABNORMAL HIGH (ref 4.0–10.5)
nRBC: 0 % (ref 0.0–0.2)

## 2022-11-10 LAB — COMPREHENSIVE METABOLIC PANEL
ALT: 23 U/L (ref 0–44)
AST: 28 U/L (ref 15–41)
Albumin: 3 g/dL — ABNORMAL LOW (ref 3.5–5.0)
Alkaline Phosphatase: 57 U/L (ref 38–126)
Anion gap: 17 — ABNORMAL HIGH (ref 5–15)
BUN: 5 mg/dL — ABNORMAL LOW (ref 8–23)
CO2: 21 mmol/L — ABNORMAL LOW (ref 22–32)
Calcium: 8.9 mg/dL (ref 8.9–10.3)
Chloride: 92 mmol/L — ABNORMAL LOW (ref 98–111)
Creatinine, Ser: 0.52 mg/dL (ref 0.44–1.00)
GFR, Estimated: >60 mL/min (ref >60)
Glucose, Bld: 135 mg/dL — ABNORMAL HIGH (ref 70–99)
Potassium: 4.4 mmol/L (ref 3.5–5.1)
Sodium: 130 mmol/L — ABNORMAL LOW (ref 135–145)
Total Bilirubin: 1 mg/dL (ref 0.3–1.2)
Total Protein: 6.3 g/dL — ABNORMAL LOW (ref 6.5–8.1)

## 2022-11-10 LAB — LIPASE, BLOOD: Lipase: 21 U/L (ref 11–51)

## 2022-11-10 NOTE — ED Triage Notes (Signed)
Pt arrived from home via [facility] c/o hypotension and diarrhea x 7 days. Pt recently d/c from icu x 1 week ago for sepsis.

## 2022-11-11 ENCOUNTER — Emergency Department (HOSPITAL_COMMUNITY): Payer: Medicare Other

## 2022-11-11 LAB — URINALYSIS, W/ REFLEX TO CULTURE (INFECTION SUSPECTED)
Bilirubin Urine: NEGATIVE
Glucose, UA: NEGATIVE mg/dL
Hgb urine dipstick: NEGATIVE
Ketones, ur: NEGATIVE mg/dL
Nitrite: NEGATIVE
Protein, ur: NEGATIVE mg/dL
Specific Gravity, Urine: 1.008 (ref 1.005–1.030)
pH: 5 (ref 5.0–8.0)

## 2022-11-11 LAB — LACTIC ACID, PLASMA: Lactic Acid, Venous: 2 mmol/L (ref 0.5–1.9)

## 2022-11-11 LAB — TROPONIN I (HIGH SENSITIVITY)
Troponin I (High Sensitivity): 4 ng/L (ref <18)
Troponin I (High Sensitivity): 4 ng/L (ref <18)

## 2022-11-11 LAB — D-DIMER, QUANTITATIVE: D-Dimer, Quant: 1.89 ug/mL-FEU — ABNORMAL HIGH (ref 0.00–0.50)

## 2022-11-11 LAB — BRAIN NATRIURETIC PEPTIDE: B Natriuretic Peptide: 40.4 pg/mL (ref 0.0–100.0)

## 2022-11-11 MED ORDER — SODIUM CHLORIDE 0.9 % IV BOLUS
500.0000 mL | Freq: Once | INTRAVENOUS | Status: AC
  Administered 2022-11-11: 500 mL via INTRAVENOUS

## 2022-11-11 MED ORDER — IOHEXOL 350 MG/ML SOLN
75.0000 mL | Freq: Once | INTRAVENOUS | Status: AC | PRN
  Administered 2022-11-11: 75 mL via INTRAVENOUS

## 2022-11-11 NOTE — Discharge Instructions (Addendum)
You can take florastor, available over the counter as a probiotic.

## 2022-11-11 NOTE — ED Notes (Signed)
In and out cathed patient for UA specimen.

## 2022-11-11 NOTE — ED Provider Notes (Signed)
[place] EMERGENCY DEPARTMENT AT [facility] Provider Note   CSN: [id number] Arrival date & time: 11/10/22  2212     History  Chief Complaint  Patient presents with   Hypotension   Diarrhea    [name] is a 77 y.o. female.  The history is provided by the patient, medical records, a relative and the spouse.  Diarrhea [name] is a 77 y.o. female who presents to the Emergency Department complaining of "abnormal vital signs.  She presents to the emergency department because her daughter checked her vital signs and noted that they were abnormal and called 911.  She had a low blood pressure for her today in the 80s or 90s.  She did complain of some difficulty breathing and chest discomfort at that time.  She has had a few episodes of diarrhea over the last 2 days, nonbloody.  She is also eating less because she is afraid that she is going to have another bowel movement.  No abdominal pain.  No fever, vomiting.  She was recently admitted to the hospital for sepsis with a UTI.  She did have follow-up with urology on Monday and PCP on Tuesday.  She is not currently on antibiotics.  She has a history of polio and is bedbound at baseline.      Home Medications Prior to Admission medications   Medication Sig Start Date End Date Taking? Authorizing Provider  amitriptyline (ELAVIL) 25 MG tablet Take 25 mg by mouth at bedtime.    [provider]  ascorbic acid (VITAMIN C) 500 MG tablet Take 500 mg by mouth daily.    [provider]  aspirin 81 MG tablet Take 81 mg by mouth at bedtime.     [provider]  atorvastatin (LIPITOR) 40 MG tablet Take 40 mg by mouth daily.    [provider]  b complex vitamins tablet Take 1 tablet by mouth daily.    [provider]  carvedilol (COREG) 25 MG tablet Take 25 mg by mouth 2 (two) times daily with a meal.    [provider]  clonazePAM (KLONOPIN) 0.5 MG tablet Take 0.5 mg by mouth 2  (two) times daily.    [provider]  gabapentin (NEURONTIN) 300 MG capsule Take 300 mg by mouth at bedtime.    [provider]  Iron, Ferrous Sulfate, 325 (65 Fe) MG TABS Take 1 tablet by mouth daily.    [provider]  levothyroxine (SYNTHROID, LEVOTHROID) 100 MCG tablet Take 100 mcg by mouth daily before breakfast.    [provider]  loratadine (CLARITIN) 10 MG tablet Take 10 mg by mouth daily. 12/17/19   [provider]  metFORMIN (GLUCOPHAGE-XR) 500 MG 24 hr tablet Take 1 tablet by mouth in the morning and at bedtime.  12/24/18   [provider]  mirabegron ER (MYRBETRIQ) 50 MG TB24 tablet Take 50 mg by mouth daily.    [provider]  Multiple Vitamins-Minerals (MULTIVITAMIN WITH MINERALS) tablet Take 1 tablet by mouth daily.    [provider]  nitroGLYCERIN (NITROSTAT) 0.4 MG SL tablet Place 1 tablet (0.4 mg total) under the tongue every 5 (five) minutes as needed for chest pain. 08/02/21   Alver Sorrow, NP  pantoprazole (PROTONIX) 40 MG tablet Take 1 tablet by mouth daily. Take 1 tab daily 12/21/14   [provider]  telmisartan (MICARDIS) 80 MG tablet Take 1 tablet by mouth daily. Take 1 tab daily  12/14/14   [provider]  tiZANidine (ZANAFLEX) 4 MG tablet Take 4 mg by mouth 5 (five) times daily as needed for muscle spasms. PAIN    [provider]  torsemide (DEMADEX) 5 MG tablet Take 5 mg by mouth daily.    [provider]  Vitamin D, Ergocalciferol, (DRISDOL) 1.25 MG (50000 UNIT) CAPS capsule Take 50,000 Units by mouth every 7 (seven) days.    [provider]  vitamin E 180 MG (400 UNITS) capsule Take 400 Units by mouth daily.    [provider]  Zinc 50 MG TABS Take 50 mg by mouth daily.    [provider]  zolpidem (AMBIEN) 5 MG tablet Take 5 mg by mouth at bedtime as needed for sleep.    [provider]      Allergies    Tamsulosin,  Ciprofloxacin, Carisprodol [carisoprodol], Penicillins, Sulfa antibiotics, Acetaminophen, Macrodantin [nitrofurantoin macrocrystal], Nsaids, Percocet [oxycodone-acetaminophen], Propoxyphene, and Propoxyphene n-acetaminophen    Review of Systems   Review of Systems  Gastrointestinal:  Positive for diarrhea.  All other systems reviewed and are negative.   Physical Exam Updated Vital Signs BP (!) 146/62   Pulse 71   Temp 98.4 F (36.9 C) (Oral)   Resp 15   Ht 5' (1.524 m)   Wt 104.8 kg   SpO2 97%   BMI 45.12 kg/m  Physical Exam Vitals and nursing note reviewed.  Constitutional:      Appearance: She is well-developed.  HENT:     Head: Normocephalic and atraumatic.  Cardiovascular:     Rate and Rhythm: Normal rate and regular rhythm.     Heart sounds: No murmur heard. Pulmonary:     Effort: Pulmonary effort is normal. No respiratory distress.     Breath sounds: Normal breath sounds.  Abdominal:     Palpations: Abdomen is soft.     Tenderness: There is no guarding or rebound.     Comments: Mild lower abdominal tenderness  Genitourinary:    Comments: There is a small amount of pitting edema to the months without any local erythema. Musculoskeletal:     Comments: Diminutive bilateral lower extremities with pitting edema to bilateral lower extremities.  There is a healing wounds to the left shin without significant erythema.  Skin:    General: Skin is warm and dry.  Neurological:     Mental Status: She is alert and oriented to person, place, and time.     Comments: 5 out of 5 grip strength in bilateral upper extremities.  Paresis to bilateral lower extremities.  Psychiatric:        Behavior: Behavior normal.     ED Results / Procedures / Treatments   Labs (all labs ordered are listed, but only abnormal results are displayed) Labs Reviewed  COMPREHENSIVE METABOLIC PANEL - Abnormal; Notable for the following components:      Result Value   Sodium 130 (*)    Chloride 92  (*)    CO2 21 (*)    Glucose, Bld 135 (*)    BUN 5 (*)    Total Protein 6.3 (*)    Albumin 3.0 (*)    Anion gap 17 (*)    All other components within normal limits  CBC - Abnormal; Notable for the following components:   WBC 17.3 (*)    MCV 100.9 (*)    Platelets 529 (*)    All other components within normal limits  URINALYSIS, W/ REFLEX TO CULTURE (INFECTION SUSPECTED) -  Abnormal; Notable for the following components:   APPearance HAZY (*)    Leukocytes,Ua MODERATE (*)    Bacteria, UA RARE (*)    All other components within normal limits  LACTIC ACID, PLASMA - Abnormal; Notable for the following components:   Lactic Acid, Venous 2.0 (*)    All other components within normal limits  D-DIMER, QUANTITATIVE - Abnormal; Notable for the following components:   D-Dimer, Quant 1.89 (*)    All other components within normal limits  URINE CULTURE  LIPASE, BLOOD  BRAIN NATRIURETIC PEPTIDE  TROPONIN I (HIGH SENSITIVITY)  TROPONIN I (HIGH SENSITIVITY)    EKG EKG Interpretation Date/Time:  Friday November 10 2022 22:19:24 EDT Ventricular Rate:  56 PR Interval:  136 QRS Duration:  88 QT Interval:  480 QTC Calculation: 463 R Axis:   38  Text Interpretation: Sinus bradycardia Low voltage QRS Cannot rule out Anterior infarct , age undetermined Abnormal ECG Interpretation limited secondary to artifact Confirmed by Tilden Fossa [phone]) on 11/11/2022 12:39:32 AM  Radiology CT Angio Chest PE W/Cm &/Or Wo Cm  Result Date: 11/11/2022 CLINICAL DATA:  77 year old female with positive D-dimer. Suspected pulmonary embolism. Nonlocalized abdominal pain. EXAM: CT ANGIOGRAPHY CHEST CT ABDOMEN AND PELVIS WITH CONTRAST TECHNIQUE: Multidetector CT imaging of the chest was performed using the standard protocol during bolus administration of intravenous contrast. Multiplanar CT image reconstructions and MIPs were obtained to evaluate the vascular anatomy. Multidetector CT imaging of the abdomen and pelvis  was performed using the standard protocol during bolus administration of intravenous contrast. RADIATION DOSE REDUCTION: This exam was performed according to the departmental dose-optimization program which includes automated exposure control, adjustment of the mA and/or kV according to patient size and/or use of iterative reconstruction technique. CONTRAST:  75mL OMNIPAQUE IOHEXOL 350 MG/ML SOLN COMPARISON:  CT of the abdomen and pelvis 10/27/2022. No prior chest CT. FINDINGS: CTA CHEST FINDINGS Cardiovascular: No filling defects within the pulmonary arterial tree to suggest pulmonary embolism. Heart size is mildly enlarged. There is no significant pericardial fluid, thickening or pericardial calcification. There is aortic atherosclerosis, as well as atherosclerosis of the great vessels of the mediastinum and the coronary arteries, including calcified atherosclerotic plaque in the left anterior descending coronary artery. Aberrant right subclavian artery (normal anatomical variant). Mediastinum/Nodes: No pathologically enlarged mediastinal or hilar lymph nodes. Esophagus is unremarkable in appearance. No axillary lymphadenopathy. Lungs/Pleura: Dependent areas of subsegmental atelectasis are noted in the lung bases posteriorly bilaterally. Trace bilateral pleural effusions. No consolidative airspace disease. No pneumothorax. No suspicious appearing pulmonary nodules or masses are noted. Musculoskeletal: There are no aggressive appearing lytic or blastic lesions noted in the visualized portions of the skeleton. Review of the MIP images confirms the above findings. CT ABDOMEN and PELVIS FINDINGS Hepatobiliary: No suspicious cystic or solid hepatic lesions. In segment 5 of the liver adjacent to the gallbladder fossa (coronal image 39 of series 6) there is a 1.2 cm low-attenuation lesion, compatible with a small cyst (no imaging follow-up recommended). No other aggressive appearing hepatic lesions are noted. No intra or  extrahepatic biliary ductal dilatation. Status post cholecystectomy. Pancreas: No pancreatic mass. No pancreatic ductal dilatation. No pancreatic or peripancreatic fluid collections or inflammatory changes. Spleen: Unremarkable. Adrenals/Urinary Tract: Bilateral kidneys and bilateral adrenal glands are normal in appearance. No hydroureteronephrosis. Urinary bladder is completely decompressed around an indwelling Foley balloon catheter. Stomach/Bowel: The appearance of the stomach is normal. No pathologic dilatation of small bowel or colon. The appendix is not confidently identified and may  be surgically absent. Regardless, there are no inflammatory changes noted adjacent to the cecum to suggest the presence of an acute appendicitis at this time. Vascular/Lymphatic: Atherosclerosis in the abdominal aorta and pelvic vasculature. No lymphadenopathy noted in the abdomen or pelvis. Reproductive: Status post hysterectomy.  Ovaries are atrophic. Other: No significant volume of ascites.  No pneumoperitoneum. Musculoskeletal: Levoscoliosis of the lumbar spine. There are no aggressive appearing lytic or blastic lesions noted in the visualized portions of the skeleton. Review of the MIP images confirms the above findings. IMPRESSION: 1. No acute findings are noted in the chest, abdomen or pelvis to account for the patient's symptoms. Specifically, no evidence of pulmonary embolism. 2. Trace bilateral pleural effusions with small amount of subsegmental atelectasis in the lower lobes of the lungs bilaterally. 3. Mild cardiomegaly. 4. Aortic atherosclerosis, in addition to left anterior descending coronary artery disease. Assessment for potential risk factor modification, dietary therapy or pharmacologic therapy may be warranted, if clinically indicated. 5. Additional incidental findings, as above. Electronically Signed   By: Trudie Reed M.D.   On: 11/11/2022 05:26   CT ABDOMEN PELVIS W CONTRAST  Result Date:  11/11/2022 CLINICAL DATA:  77 year old female with positive D-dimer. Suspected pulmonary embolism. Nonlocalized abdominal pain. EXAM: CT ANGIOGRAPHY CHEST CT ABDOMEN AND PELVIS WITH CONTRAST TECHNIQUE: Multidetector CT imaging of the chest was performed using the standard protocol during bolus administration of intravenous contrast. Multiplanar CT image reconstructions and MIPs were obtained to evaluate the vascular anatomy. Multidetector CT imaging of the abdomen and pelvis was performed using the standard protocol during bolus administration of intravenous contrast. RADIATION DOSE REDUCTION: This exam was performed according to the departmental dose-optimization program which includes automated exposure control, adjustment of the mA and/or kV according to patient size and/or use of iterative reconstruction technique. CONTRAST:  75mL OMNIPAQUE IOHEXOL 350 MG/ML SOLN COMPARISON:  CT of the abdomen and pelvis 10/27/2022. No prior chest CT. FINDINGS: CTA CHEST FINDINGS Cardiovascular: No filling defects within the pulmonary arterial tree to suggest pulmonary embolism. Heart size is mildly enlarged. There is no significant pericardial fluid, thickening or pericardial calcification. There is aortic atherosclerosis, as well as atherosclerosis of the great vessels of the mediastinum and the coronary arteries, including calcified atherosclerotic plaque in the left anterior descending coronary artery. Aberrant right subclavian artery (normal anatomical variant). Mediastinum/Nodes: No pathologically enlarged mediastinal or hilar lymph nodes. Esophagus is unremarkable in appearance. No axillary lymphadenopathy. Lungs/Pleura: Dependent areas of subsegmental atelectasis are noted in the lung bases posteriorly bilaterally. Trace bilateral pleural effusions. No consolidative airspace disease. No pneumothorax. No suspicious appearing pulmonary nodules or masses are noted. Musculoskeletal: There are no aggressive appearing lytic or  blastic lesions noted in the visualized portions of the skeleton. Review of the MIP images confirms the above findings. CT ABDOMEN and PELVIS FINDINGS Hepatobiliary: No suspicious cystic or solid hepatic lesions. In segment 5 of the liver adjacent to the gallbladder fossa (coronal image 39 of series 6) there is a 1.2 cm low-attenuation lesion, compatible with a small cyst (no imaging follow-up recommended). No other aggressive appearing hepatic lesions are noted. No intra or extrahepatic biliary ductal dilatation. Status post cholecystectomy. Pancreas: No pancreatic mass. No pancreatic ductal dilatation. No pancreatic or peripancreatic fluid collections or inflammatory changes. Spleen: Unremarkable. Adrenals/Urinary Tract: Bilateral kidneys and bilateral adrenal glands are normal in appearance. No hydroureteronephrosis. Urinary bladder is completely decompressed around an indwelling Foley balloon catheter. Stomach/Bowel: The appearance of the stomach is normal. No pathologic dilatation of small bowel or  colon. The appendix is not confidently identified and may be surgically absent. Regardless, there are no inflammatory changes noted adjacent to the cecum to suggest the presence of an acute appendicitis at this time. Vascular/Lymphatic: Atherosclerosis in the abdominal aorta and pelvic vasculature. No lymphadenopathy noted in the abdomen or pelvis. Reproductive: Status post hysterectomy.  Ovaries are atrophic. Other: No significant volume of ascites.  No pneumoperitoneum. Musculoskeletal: Levoscoliosis of the lumbar spine. There are no aggressive appearing lytic or blastic lesions noted in the visualized portions of the skeleton. Review of the MIP images confirms the above findings. IMPRESSION: 1. No acute findings are noted in the chest, abdomen or pelvis to account for the patient's symptoms. Specifically, no evidence of pulmonary embolism. 2. Trace bilateral pleural effusions with small amount of subsegmental  atelectasis in the lower lobes of the lungs bilaterally. 3. Mild cardiomegaly. 4. Aortic atherosclerosis, in addition to left anterior descending coronary artery disease. Assessment for potential risk factor modification, dietary therapy or pharmacologic therapy may be warranted, if clinically indicated. 5. Additional incidental findings, as above. Electronically Signed   By: Trudie Reed M.D.   On: 11/11/2022 05:26   DG Chest Port 1 View  Result Date: 11/11/2022 CLINICAL DATA:  Chest pain EXAM: PORTABLE CHEST 1 VIEW COMPARISON:  10/29/2022 FINDINGS: The heart size and mediastinal contours are within normal limits. Both lungs are clear. The visualized skeletal structures are unremarkable. IMPRESSION: No active disease. Electronically Signed   By: Alcide Clever M.D.   On: 11/11/2022 02:24    Procedures Procedures    Medications Ordered in ED Medications  sodium chloride 0.9 % bolus 500 mL (0 mLs Intravenous Stopped 11/11/22 0355)  iohexol (OMNIPAQUE) 350 MG/ML injection 75 mL (75 mLs Intravenous Contrast Given 11/11/22 0443)    ED Course/ Medical Decision Making/ A&P                             Medical Decision Making Amount and/or Complexity of Data Reviewed Labs: ordered. Radiology: ordered.  Risk Prescription drug management.   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  Her hypotension resolved without intervention at time of ED arrival.  Labs significant for stable hyponatremia.  She does have leukocytosis and thrombocythemia, similar but slightly uptrending when compared to priors.  BNP is within normal limits.  Troponins are negative x 2.  She does not have any active chest pain.  Current clinical picture is not consistent with ACS, decompensated CHF, pneumonia.  No  evidence of soft tissue infection on examination.  UA is not consistent with UTI-we will send a culture given her recent admission with sepsis.  Given her abdominal tenderness a CT abdomen pelvis was obtained.  Given her recent admission and complaint of chest pain and shortness of breath a D-dimer was obtained, which was elevated and a CTA PE study was obtained.  CTA PE as well as CT abdomen pelvis are negative for significant abnormality.  Patient continues to be asymptomatic during her ED stay.  She has a minimally elevated lactic acid at 2.0, unclear significance as there is no evidence of poor perfusion or infection at this time.  Feel she is stable at this time for discharge with outpatient follow-up and return precautions.  She was treated with gentle fluid hydration for  possible mild dehydration in the setting of her recent diarrhea.  She did not have any episodes of diarrhea during her ED stay.  C. difficile is unlikely given her lack of active diarrhea in the ED.  Discussed with patient and family incidental finding of atherosclerosis on CT scan.        Final Clinical Impression(s) / ED Diagnoses Final diagnoses:  Diarrhea, unspecified type  Weakness  Atherosclerosis    Rx / DC Orders ED Discharge Orders     None         Tilden Fossa, MD 11/11/22 [phone]

## 2022-11-12 LAB — URINE CULTURE: Culture: NO GROWTH

## 2022-12-07 ENCOUNTER — Other Ambulatory Visit (HOSPITAL_BASED_OUTPATIENT_CLINIC_OR_DEPARTMENT_OTHER): Payer: Self-pay | Admitting: Family

## 2022-12-07 DIAGNOSIS — I25118 Atherosclerotic heart disease of native coronary artery with other forms of angina pectoris: Secondary | ICD-10-CM

## 2022-12-07 NOTE — Telephone Encounter (Signed)
Rx request sent to pharmacy.  

## 2022-12-19 ENCOUNTER — Other Ambulatory Visit (HOSPITAL_COMMUNITY): Payer: Self-pay | Admitting: Orthopedic Surgery

## 2022-12-19 ENCOUNTER — Telehealth (HOSPITAL_COMMUNITY): Payer: Self-pay

## 2022-12-19 DIAGNOSIS — M545 Low back pain, unspecified: Secondary | ICD-10-CM

## 2022-12-19 NOTE — Telephone Encounter (Signed)
Called to schedule epidural injection, no answer, left vm. AB

## 2022-12-26 ENCOUNTER — Ambulatory Visit (HOSPITAL_COMMUNITY)
Admission: RE | Admit: 2022-12-26 | Discharge: 2022-12-26 | Disposition: A | Discharge status: Home / Self Care | Payer: Medicare Other | Source: Ambulatory Visit | Attending: Orthopedic Surgery | Admitting: Orthopedic Surgery

## 2022-12-26 DIAGNOSIS — M545 Low back pain, unspecified: Secondary | ICD-10-CM | POA: Diagnosis present

## 2022-12-26 HISTORY — PX: IR INJECT/THERA/INC NEEDLE/CATH/PLC EPI/LUMB/SAC W/IMG: IMG6130

## 2022-12-26 MED ORDER — METHYLPREDNISOLONE ACETATE 80 MG/ML IJ SUSP
INTRAMUSCULAR | Status: AC
  Filled 2022-12-26: qty 1

## 2022-12-26 MED ORDER — METHYLPREDNISOLONE ACETATE 80 MG/ML IJ SUSP: 80.0000 mg | Freq: Once | INTRAMUSCULAR | Status: DC

## 2022-12-26 MED ORDER — LIDOCAINE-EPINEPHRINE (PF) 1 %-1:200000 IJ SOLN
10.0000 mL | Freq: Once | INTRAMUSCULAR | Status: AC
  Administered 2022-12-26: 5 mL

## 2022-12-26 MED ORDER — STERILE WATER FOR INJECTION IJ SOLN
INTRAMUSCULAR | Status: AC
  Filled 2022-12-26: qty 10

## 2022-12-26 MED ORDER — IOHEXOL 180 MG/ML  SOLN
20.0000 mL | Freq: Once | INTRAMUSCULAR | Status: AC | PRN
  Administered 2022-12-26: 3 mL via INTRATHECAL

## 2022-12-26 MED ORDER — LIDOCAINE-EPINEPHRINE 1 %-1:100000 IJ SOLN
INTRAMUSCULAR | Status: AC
  Filled 2022-12-26: qty 1

## 2022-12-26 MED ORDER — IOHEXOL 180 MG/ML  SOLN
INTRAMUSCULAR | Status: AC
  Filled 2022-12-26: qty 10

## 2022-12-26 MED ORDER — METHYLPREDNISOLONE ACETATE 40 MG/ML IJ SUSP
INTRAMUSCULAR | Status: AC
  Filled 2022-12-26: qty 1

## 2023-01-07 ENCOUNTER — Other Ambulatory Visit: Payer: Self-pay

## 2023-01-07 ENCOUNTER — Inpatient Hospital Stay (HOSPITAL_COMMUNITY): Payer: Medicare Other

## 2023-01-07 ENCOUNTER — Emergency Department (HOSPITAL_COMMUNITY): Payer: Medicare Other

## 2023-01-07 ENCOUNTER — Inpatient Hospital Stay (HOSPITAL_COMMUNITY)
Admission: EM | Admit: 2023-01-07 | Discharge: 2023-01-11 | DRG: 690 | Disposition: A | Discharge status: Home / Self Care | Payer: Medicare Other | Attending: Family Medicine | Admitting: Family Medicine

## 2023-01-07 DIAGNOSIS — N12 Tubulo-interstitial nephritis, not specified as acute or chronic: Secondary | ICD-10-CM | POA: Diagnosis present

## 2023-01-07 DIAGNOSIS — Z7401 Bed confinement status: Secondary | ICD-10-CM

## 2023-01-07 DIAGNOSIS — Z993 Dependence on wheelchair: Secondary | ICD-10-CM | POA: Diagnosis not present

## 2023-01-07 DIAGNOSIS — I110 Hypertensive heart disease with heart failure: Secondary | ICD-10-CM | POA: Diagnosis present

## 2023-01-07 DIAGNOSIS — E039 Hypothyroidism, unspecified: Secondary | ICD-10-CM | POA: Diagnosis present

## 2023-01-07 DIAGNOSIS — Z8249 Family history of ischemic heart disease and other diseases of the circulatory system: Secondary | ICD-10-CM | POA: Diagnosis not present

## 2023-01-07 DIAGNOSIS — I493 Ventricular premature depolarization: Secondary | ICD-10-CM | POA: Diagnosis present

## 2023-01-07 DIAGNOSIS — K5900 Constipation, unspecified: Secondary | ICD-10-CM | POA: Diagnosis present

## 2023-01-07 DIAGNOSIS — Z7984 Long term (current) use of oral hypoglycemic drugs: Secondary | ICD-10-CM

## 2023-01-07 DIAGNOSIS — E871 Hypo-osmolality and hyponatremia: Secondary | ICD-10-CM | POA: Diagnosis present

## 2023-01-07 DIAGNOSIS — Z6841 Body Mass Index (BMI) 40.0 and over, adult: Secondary | ICD-10-CM | POA: Diagnosis not present

## 2023-01-07 DIAGNOSIS — Z79890 Hormone replacement therapy (postmenopausal): Secondary | ICD-10-CM

## 2023-01-07 DIAGNOSIS — G14 Postpolio syndrome: Secondary | ICD-10-CM

## 2023-01-07 DIAGNOSIS — L24A2 Irritant contact dermatitis due to fecal, urinary or dual incontinence: Secondary | ICD-10-CM | POA: Diagnosis present

## 2023-01-07 DIAGNOSIS — Z90710 Acquired absence of both cervix and uterus: Secondary | ICD-10-CM

## 2023-01-07 DIAGNOSIS — G8929 Other chronic pain: Secondary | ICD-10-CM | POA: Diagnosis present

## 2023-01-07 DIAGNOSIS — L89159 Pressure ulcer of sacral region, unspecified stage: Secondary | ICD-10-CM | POA: Diagnosis not present

## 2023-01-07 DIAGNOSIS — Z79899 Other long term (current) drug therapy: Secondary | ICD-10-CM

## 2023-01-07 DIAGNOSIS — I5032 Chronic diastolic (congestive) heart failure: Secondary | ICD-10-CM | POA: Diagnosis present

## 2023-01-07 DIAGNOSIS — K219 Gastro-esophageal reflux disease without esophagitis: Secondary | ICD-10-CM | POA: Diagnosis present

## 2023-01-07 DIAGNOSIS — I480 Paroxysmal atrial fibrillation: Secondary | ICD-10-CM | POA: Diagnosis present

## 2023-01-07 DIAGNOSIS — Z833 Family history of diabetes mellitus: Secondary | ICD-10-CM

## 2023-01-07 DIAGNOSIS — E785 Hyperlipidemia, unspecified: Secondary | ICD-10-CM | POA: Diagnosis present

## 2023-01-07 DIAGNOSIS — I10 Essential (primary) hypertension: Secondary | ICD-10-CM | POA: Diagnosis not present

## 2023-01-07 DIAGNOSIS — F419 Anxiety disorder, unspecified: Secondary | ICD-10-CM | POA: Diagnosis present

## 2023-01-07 DIAGNOSIS — E872 Acidosis, unspecified: Secondary | ICD-10-CM | POA: Diagnosis present

## 2023-01-07 DIAGNOSIS — E7800 Pure hypercholesterolemia, unspecified: Secondary | ICD-10-CM | POA: Diagnosis present

## 2023-01-07 DIAGNOSIS — Z7982 Long term (current) use of aspirin: Secondary | ICD-10-CM

## 2023-01-07 DIAGNOSIS — R0600 Dyspnea, unspecified: Secondary | ICD-10-CM | POA: Diagnosis present

## 2023-01-07 DIAGNOSIS — Z792 Long term (current) use of antibiotics: Secondary | ICD-10-CM

## 2023-01-07 DIAGNOSIS — Z820 Family history of epilepsy and other diseases of the nervous system: Secondary | ICD-10-CM

## 2023-01-07 DIAGNOSIS — E11622 Type 2 diabetes mellitus with other skin ulcer: Secondary | ICD-10-CM | POA: Diagnosis present

## 2023-01-07 DIAGNOSIS — M21372 Foot drop, left foot: Secondary | ICD-10-CM | POA: Diagnosis present

## 2023-01-07 DIAGNOSIS — B961 Klebsiella pneumoniae [K. pneumoniae] as the cause of diseases classified elsewhere: Secondary | ICD-10-CM | POA: Diagnosis present

## 2023-01-07 DIAGNOSIS — L97929 Non-pressure chronic ulcer of unspecified part of left lower leg with unspecified severity: Secondary | ICD-10-CM | POA: Diagnosis present

## 2023-01-07 DIAGNOSIS — I4891 Unspecified atrial fibrillation: Secondary | ICD-10-CM | POA: Diagnosis present

## 2023-01-07 DIAGNOSIS — G4733 Obstructive sleep apnea (adult) (pediatric): Secondary | ICD-10-CM | POA: Diagnosis present

## 2023-01-07 DIAGNOSIS — Z803 Family history of malignant neoplasm of breast: Secondary | ICD-10-CM

## 2023-01-07 DIAGNOSIS — M21371 Foot drop, right foot: Secondary | ICD-10-CM | POA: Diagnosis present

## 2023-01-07 DIAGNOSIS — Z87442 Personal history of urinary calculi: Secondary | ICD-10-CM

## 2023-01-07 DIAGNOSIS — N10 Acute tubulo-interstitial nephritis: Secondary | ICD-10-CM | POA: Diagnosis not present

## 2023-01-07 DIAGNOSIS — Z9049 Acquired absence of other specified parts of digestive tract: Secondary | ICD-10-CM

## 2023-01-07 DIAGNOSIS — N319 Neuromuscular dysfunction of bladder, unspecified: Secondary | ICD-10-CM | POA: Diagnosis present

## 2023-01-07 DIAGNOSIS — M1712 Unilateral primary osteoarthritis, left knee: Secondary | ICD-10-CM | POA: Diagnosis present

## 2023-01-07 DIAGNOSIS — Z87440 Personal history of urinary (tract) infections: Secondary | ICD-10-CM

## 2023-01-07 DIAGNOSIS — E119 Type 2 diabetes mellitus without complications: Secondary | ICD-10-CM

## 2023-01-07 LAB — COMPREHENSIVE METABOLIC PANEL
ALT: 23 U/L (ref 0–44)
AST: 26 U/L (ref 15–41)
Albumin: 3.4 g/dL — ABNORMAL LOW (ref 3.5–5.0)
Alkaline Phosphatase: 60 U/L (ref 38–126)
Anion gap: 11 (ref 5–15)
BUN: 10 mg/dL (ref 8–23)
CO2: 25 mmol/L (ref 22–32)
Calcium: 8.9 mg/dL (ref 8.9–10.3)
Chloride: 97 mmol/L — ABNORMAL LOW (ref 98–111)
Creatinine, Ser: 0.4 mg/dL — ABNORMAL LOW (ref 0.44–1.00)
GFR, Estimated: >60 mL/min (ref >60)
Glucose, Bld: 145 mg/dL — ABNORMAL HIGH (ref 70–99)
Potassium: 4.1 mmol/L (ref 3.5–5.1)
Sodium: 133 mmol/L — ABNORMAL LOW (ref 135–145)
Total Bilirubin: 1 mg/dL (ref 0.3–1.2)
Total Protein: 7 g/dL (ref 6.5–8.1)

## 2023-01-07 LAB — URINALYSIS, W/ REFLEX TO CULTURE (INFECTION SUSPECTED)
Bilirubin Urine: NEGATIVE
Glucose, UA: NEGATIVE mg/dL
Ketones, ur: NEGATIVE mg/dL
Nitrite: POSITIVE — AB
Protein, ur: 30 mg/dL — AB
RBC / HPF: >50 RBC/hpf (ref 0–5)
Specific Gravity, Urine: 1.005 (ref 1.005–1.030)
WBC, UA: >50 WBC/hpf (ref 0–5)
pH: 5 (ref 5.0–8.0)

## 2023-01-07 LAB — CBC WITH DIFFERENTIAL/PLATELET
Abs Immature Granulocytes: 0.11 10*3/uL — ABNORMAL HIGH (ref 0.00–0.07)
Basophils Absolute: 0.1 10*3/uL (ref 0.0–0.1)
Basophils Relative: 1 %
Eosinophils Absolute: 1.2 10*3/uL — ABNORMAL HIGH (ref 0.0–0.5)
Eosinophils Relative: 7 %
HCT: 39.5 % (ref 36.0–46.0)
Hemoglobin: 12.8 g/dL (ref 12.0–15.0)
Immature Granulocytes: 1 %
Lymphocytes Relative: 21 %
Lymphs Abs: 3.8 10*3/uL (ref 0.7–4.0)
MCH: 30.5 pg (ref 26.0–34.0)
MCHC: 32.4 g/dL (ref 30.0–36.0)
MCV: 94.3 fL (ref 80.0–100.0)
Monocytes Absolute: 1.6 10*3/uL — ABNORMAL HIGH (ref 0.1–1.0)
Monocytes Relative: 9 %
Neutro Abs: 11 10*3/uL — ABNORMAL HIGH (ref 1.7–7.7)
Neutrophils Relative %: 61 %
Platelets: 408 10*3/uL — ABNORMAL HIGH (ref 150–400)
RBC: 4.19 MIL/uL (ref 3.87–5.11)
RDW: 14.5 % (ref 11.5–15.5)
WBC: 17.8 10*3/uL — ABNORMAL HIGH (ref 4.0–10.5)
nRBC: 0 % (ref 0.0–0.2)

## 2023-01-07 LAB — I-STAT CG4 LACTIC ACID, ED
Lactic Acid, Venous: 2.5 mmol/L (ref 0.5–1.9)
Lactic Acid, Venous: 2.7 mmol/L (ref 0.5–1.9)

## 2023-01-07 LAB — BRAIN NATRIURETIC PEPTIDE: B Natriuretic Peptide: 101.2 pg/mL — ABNORMAL HIGH (ref 0.0–100.0)

## 2023-01-07 LAB — D-DIMER, QUANTITATIVE: D-Dimer, Quant: 0.43 ug{FEU}/mL (ref 0.00–0.50)

## 2023-01-07 LAB — LIPASE, BLOOD: Lipase: 20 U/L (ref 11–51)

## 2023-01-07 MED ORDER — ENOXAPARIN SODIUM 40 MG/0.4ML IJ SOSY
40.0000 mg | PREFILLED_SYRINGE | INTRAMUSCULAR | Status: DC
  Administered 2023-01-07 – 2023-01-10 (×4): 40 mg via SUBCUTANEOUS
  Filled 2023-01-07 (×4): qty 0.4

## 2023-01-07 MED ORDER — SENNOSIDES-DOCUSATE SODIUM 8.6-50 MG PO TABS
1.0000 | ORAL_TABLET | Freq: Every evening | ORAL | Status: DC | PRN
  Administered 2023-01-10: 1 via ORAL
  Filled 2023-01-07: qty 1

## 2023-01-07 MED ORDER — MIRABEGRON ER 25 MG PO TB24
50.0000 mg | ORAL_TABLET | Freq: Every day | ORAL | Status: DC
  Administered 2023-01-08 – 2023-01-11 (×4): 50 mg via ORAL
  Filled 2023-01-07 (×4): qty 2

## 2023-01-07 MED ORDER — SODIUM CHLORIDE 0.9 % IV SOLN
1.0000 g | INTRAVENOUS | Status: DC
  Administered 2023-01-08 – 2023-01-11 (×4): 1 g via INTRAVENOUS
  Filled 2023-01-07 (×4): qty 10

## 2023-01-07 MED ORDER — ATORVASTATIN CALCIUM 40 MG PO TABS
40.0000 mg | ORAL_TABLET | Freq: Every day | ORAL | Status: DC
  Administered 2023-01-08 – 2023-01-11 (×4): 40 mg via ORAL
  Filled 2023-01-07 (×4): qty 2

## 2023-01-07 MED ORDER — IRBESARTAN 150 MG PO TABS
150.0000 mg | ORAL_TABLET | Freq: Every day | ORAL | Status: DC
  Administered 2023-01-08 – 2023-01-11 (×4): 150 mg via ORAL
  Filled 2023-01-07 (×4): qty 1

## 2023-01-07 MED ORDER — HYDROMORPHONE HCL 1 MG/ML IJ SOLN
0.5000 mg | INTRAMUSCULAR | Status: DC | PRN
  Administered 2023-01-08 – 2023-01-10 (×8): 0.5 mg via INTRAVENOUS
  Filled 2023-01-07 (×8): qty 0.5

## 2023-01-07 MED ORDER — ONDANSETRON HCL 4 MG/2ML IJ SOLN
4.0000 mg | Freq: Once | INTRAMUSCULAR | Status: AC
  Administered 2023-01-07: 4 mg via INTRAVENOUS
  Filled 2023-01-07: qty 2

## 2023-01-07 MED ORDER — IPRATROPIUM-ALBUTEROL 0.5-2.5 (3) MG/3ML IN SOLN
3.0000 mL | RESPIRATORY_TRACT | Status: DC | PRN
  Administered 2023-01-08: 3 mL via RESPIRATORY_TRACT
  Filled 2023-01-07: qty 3

## 2023-01-07 MED ORDER — SODIUM CHLORIDE 0.9 % IV SOLN: INTRAVENOUS | Status: DC

## 2023-01-07 MED ORDER — SODIUM CHLORIDE 0.9 % IV SOLN
2.0000 g | Freq: Once | INTRAVENOUS | Status: AC
  Administered 2023-01-07: 2 g via INTRAVENOUS
  Filled 2023-01-07: qty 20

## 2023-01-07 MED ORDER — CARVEDILOL 12.5 MG PO TABS
25.0000 mg | ORAL_TABLET | Freq: Two times a day (BID) | ORAL | Status: DC
  Administered 2023-01-08 – 2023-01-11 (×7): 25 mg via ORAL
  Filled 2023-01-07 (×7): qty 2

## 2023-01-07 MED ORDER — ONDANSETRON HCL 4 MG PO TABS: 4.0000 mg | ORAL_TABLET | Freq: Four times a day (QID) | ORAL | Status: DC | PRN

## 2023-01-07 MED ORDER — TORSEMIDE 10 MG PO TABS
5.0000 mg | ORAL_TABLET | Freq: Every day | ORAL | Status: DC
  Administered 2023-01-08: 5 mg via ORAL
  Filled 2023-01-07: qty 0.5

## 2023-01-07 MED ORDER — LEVOTHYROXINE SODIUM 100 MCG PO TABS
100.0000 ug | ORAL_TABLET | Freq: Every day | ORAL | Status: DC
  Administered 2023-01-08 – 2023-01-11 (×4): 100 ug via ORAL
  Filled 2023-01-07 (×4): qty 1

## 2023-01-07 MED ORDER — MORPHINE SULFATE (PF) 4 MG/ML IV SOLN
4.0000 mg | Freq: Once | INTRAVENOUS | Status: AC
  Administered 2023-01-07: 4 mg via INTRAVENOUS
  Filled 2023-01-07: qty 1

## 2023-01-07 MED ORDER — ONDANSETRON HCL 4 MG/2ML IJ SOLN: 4.0000 mg | Freq: Four times a day (QID) | INTRAMUSCULAR | Status: DC | PRN

## 2023-01-07 MED ORDER — LACTATED RINGERS IV BOLUS
1000.0000 mL | Freq: Once | INTRAVENOUS | Status: AC
  Administered 2023-01-07: 1000 mL via INTRAVENOUS

## 2023-01-07 NOTE — ED Triage Notes (Signed)
Pt arrived via EMS, from home. C/o abd cramping and pain radiating to back. Foul smelling urine and blood in urine. Bed bound due to history of polio.

## 2023-01-07 NOTE — ED Notes (Signed)
ED TO INPATIENT HANDOFF REPORT  ED Nurse Name and Phone #:   S Name/Age/Gender Bishop Dublin 77 y.o. female Room/Bed: WA14/WA14  Code Status   Code Status: Full Code  Home/SNF/Other Home  Is this baseline? Yes   Triage Complete: Triage complete  Chief Complaint Acute pyelonephritis [N10]  Triage Note Pt arrived via EMS, from home. C/o abd cramping and pain radiating to back. Foul smelling urine and blood in urine. Bed bound due to history of polio.    Allergies Allergies  Allergen Reactions   Tamsulosin Other (See Comments)    Caused uncontrollable movements   Ciprofloxacin Rash   Carisprodol [Carisoprodol] Itching   Penicillins Itching    Pt has tolerated rocephin IV   Sulfa Antibiotics Other (See Comments)    Unknown.  States as always told had an allergy   Acetaminophen Itching   Macrodantin [Nitrofurantoin Macrocrystal] Rash   Nsaids Other (See Comments)    Chest pain and stomach pain   Percocet [Oxycodone-Acetaminophen] Itching   Propoxyphene Other (See Comments)    Chest/stomach pain   Propoxyphene N-Acetaminophen Other (See Comments)    Chest/stomach pain    Level of Care/Admitting Diagnosis ED Disposition     ED Disposition  Admit   Condition  --   Comment  Hospital Area: [facility] [[id number]]  Level of Care: Med-Surg [16]  May admit patient to [facility] or [facility] if equivalent level of care is available:: Yes  Covid Evaluation: Asymptomatic - no recent exposure (last 10 days) testing not required  Diagnosis: Acute pyelonephritis [034742]  Admitting Physician: Gery Pray [[id number]]  Attending Physician: Gery Pray [[id number]]  Certification:: I certify this patient will need inpatient services for at least 2 midnights  Expected Medical Readiness: 01/09/2023          B Medical/Surgery History Past Medical History:  Diagnosis Date   Angina pectoris (HCC) 08/02/2016   Anxiety    Arthritis    "hands, knees, back"  (07/18/2013)   Asthma    CHF (congestive heart failure) (HCC)    Chronic back pain    Chronic lower back pain    Family history of anesthesia complication    Mother and Sister- N/V   GERD (gastroesophageal reflux disease)    H/O hiatal hernia    Headache(784.0)    "weekly" (07/18/2013)   Hypertension    Hypothyroidism    Insomnia    Kidney stones    Memory changes    Mental disorder    Migraine    "years ago" (07/18/2013)   Osteopenia    Post-polio syndrome    Pure hypercholesterolemia 02/07/2021   Sleep apnea    Takotsubo cardiomyopathy    Type II diabetes mellitus (HCC)    Ulcer of leg, chronic (HCC) 02/07/2021   Past Surgical History:  Procedure Laterality Date   ABDOMINAL HYSTERECTOMY     APPENDECTOMY     CARPAL TUNNEL RELEASE Bilateral    CHOLECYSTECTOMY     COLONOSCOPY     CYSTOSCOPY     IR INJECT/THERA/INC NEEDLE/CATH/PLC EPI/LUMB/SAC W/IMG  07/19/2022   IR INJECT/THERA/INC NEEDLE/CATH/PLC EPI/LUMB/SAC W/IMG  09/11/2022   IR INJECT/THERA/INC NEEDLE/CATH/PLC EPI/LUMB/SAC W/IMG  12/26/2022   KNEE ARTHROSCOPY Left    KNEE SURGERY Left    LEG SURGERY Left    polio   ORIF PATELLA Left 07/18/2013   Procedure: OPEN REDUCTION INTERNAL (ORIF) LEFT FIXATION PATELLA;  Surgeon: Verlee Rossetti, MD;  Location: [facility];  Service: Orthopedics;  Laterality:  Left;   ORIF PATELLA FRACTURE Left 07/18/2013   TUBAL LIGATION     WISDOM TOOTH EXTRACTION     WRIST TENDON TRANSFER Right      A IV Location/Drains/Wounds Patient Lines/Drains/Airways Status     Active Line/Drains/Airways     Name Placement date Placement time Site Days   Peripheral IV 01/07/23 20 G 1.88" Left Antecubital 01/07/23  1740  Antecubital  less than 1   Wound / Incision (Open or Dehisced) 10/25/22 Skin tear Pretibial Distal;Left;Lateral 10/25/22  --  Pretibial  74            Intake/Output Last 24 hours  Intake/Output Summary (Last 24 hours) at 01/07/2023 2131 Last data filed at 01/07/2023 2015 Gross per 24  hour  Intake 1100 ml  Output --  Net 1100 ml    Labs/Imaging Results for orders placed or performed during the hospital encounter of 01/07/23 (from the past 48 hour(s))  Urinalysis, w/ Reflex to Culture (Infection Suspected) -Urine, Catheterized     Status: Abnormal   Collection Time: 01/07/23  5:18 PM  Result Value Ref Range   Specimen Source URINE, CATHETERIZED    Color, Urine YELLOW YELLOW   APPearance TURBID (A) CLEAR   Specific Gravity, Urine 1.005 1.005 - 1.030   pH 5.0 5.0 - 8.0   Glucose, UA NEGATIVE NEGATIVE mg/dL   Hgb urine dipstick MODERATE (A) NEGATIVE   Bilirubin Urine NEGATIVE NEGATIVE   Ketones, ur NEGATIVE NEGATIVE mg/dL   Protein, ur 30 (A) NEGATIVE mg/dL   Nitrite POSITIVE (A) NEGATIVE   Leukocytes,Ua LARGE (A) NEGATIVE   RBC / HPF >50 0 - 5 RBC/hpf   WBC, UA >50 0 - 5 WBC/hpf    Comment:        Reflex urine culture not performed if WBC <=10, OR if Squamous epithelial cells >5. If Squamous epithelial cells >5 suggest recollection.    Bacteria, UA FEW (A) NONE SEEN   Squamous Epithelial / HPF 0-5 0 - 5 /HPF   WBC Clumps PRESENT     Comment: Performed at [facility], [address], [place], [place] [address]  Comprehensive metabolic panel     Status: Abnormal   Collection Time: 01/07/23  5:50 PM  Result Value Ref Range   Sodium 133 (L) 135 - 145 mmol/L   Potassium 4.1 3.5 - 5.1 mmol/L   Chloride 97 (L) 98 - 111 mmol/L   CO2 25 22 - 32 mmol/L   Glucose, Bld 145 (H) 70 - 99 mg/dL    Comment: Glucose reference range applies only to samples taken after fasting for at least 8 hours.   BUN 10 8 - 23 mg/dL   Creatinine, Ser 6.04 (L) 0.44 - 1.00 mg/dL   Calcium 8.9 8.9 - 54.0 mg/dL   Total Protein 7.0 6.5 - 8.1 g/dL   Albumin 3.4 (L) 3.5 - 5.0 g/dL   AST 26 15 - 41 U/L   ALT 23 0 - 44 U/L   Alkaline Phosphatase 60 38 - 126 U/L   Total Bilirubin 1.0 0.3 - 1.2 mg/dL   GFR, Estimated >98 >11 mL/min    Comment: (NOTE) Calculated using  the CKD-EPI Creatinine Equation (2021)    Anion gap 11 5 - 15    Comment: Performed at [facility], [address], [place], [place] [address]  Lipase, blood     Status: None   Collection Time: 01/07/23  5:50 PM  Result Value Ref Range  Lipase 20 11 - 51 U/L    Comment: Performed at [facility], [address], [place], [place] [address]  CBC with Diff     Status: Abnormal   Collection Time: 01/07/23  5:50 PM  Result Value Ref Range   WBC 17.8 (H) 4.0 - 10.5 K/uL   RBC 4.19 3.87 - 5.11 MIL/uL   Hemoglobin 12.8 12.0 - 15.0 g/dL   HCT 60.4 54.0 - 98.1 %   MCV 94.3 80.0 - 100.0 fL   MCH 30.5 26.0 - 34.0 pg   MCHC 32.4 30.0 - 36.0 g/dL   RDW 19.1 47.8 - 29.5 %   Platelets 408 (H) 150 - 400 K/uL   nRBC 0.0 0.0 - 0.2 %   Neutrophils Relative % 61 %   Neutro Abs 11.0 (H) 1.7 - 7.7 K/uL   Lymphocytes Relative 21 %   Lymphs Abs 3.8 0.7 - 4.0 K/uL   Monocytes Relative 9 %   Monocytes Absolute 1.6 (H) 0.1 - 1.0 K/uL   Eosinophils Relative 7 %   Eosinophils Absolute 1.2 (H) 0.0 - 0.5 K/uL   Basophils Relative 1 %   Basophils Absolute 0.1 0.0 - 0.1 K/uL   Immature Granulocytes 1 %   Abs Immature Granulocytes 0.11 (H) 0.00 - 0.07 K/uL    Comment: Performed at [facility], [address] [place], [place] [address]  I-Stat CG4 Lactic Acid     Status: Abnormal   Collection Time: 01/07/23  6:05 PM  Result Value Ref Range   Lactic Acid, Venous 2.5 (HH) 0.5 - 1.9 mmol/L  I-Stat CG4 Lactic Acid     Status: Abnormal   Collection Time: 01/07/23  8:22 PM  Result Value Ref Range   Lactic Acid, Venous 2.7 (HH) 0.5 - 1.9 mmol/L   Comment NOTIFIED PHYSICIAN    CT Renal Stone Study  Result Date: 01/07/2023 CLINICAL DATA:  Cramping abdominal pain radiating to the back with foul-smelling urine. Blood in urine. EXAM: CT ABDOMEN AND PELVIS WITHOUT CONTRAST TECHNIQUE: Multidetector CT imaging of the abdomen and pelvis was performed following  the standard protocol without IV contrast. RADIATION DOSE REDUCTION: This exam was performed according to the departmental dose-optimization program which includes automated exposure control, adjustment of the mA and/or kV according to patient size and/or use of iterative reconstruction technique. COMPARISON:  CT 11/11/2022 and older FINDINGS: Lower chest: Slight linear opacity lung bases likely scar or atelectasis. No pleural effusion Hepatobiliary: On this non IV contrast exam, grossly preserved hepatic parenchyma only slight nodular contour previous cholecystectomy Pancreas: Unremarkable. No pancreatic ductal dilatation or surrounding inflammatory changes. Spleen: Normal in size without focal abnormality. Adrenals/Urinary Tract: Adrenal glands are preserved. Mild renal atrophy. The right kidney does not have a renal or ureteral stone. No collecting system dilatation. The right ureter has a normal course and caliber down to the bladder. The bladder wall is thickened and there is some stranding. Please correlate for cystitis. In addition there is mild ectasia of the left renal collecting system without renal or ureteral stone and adjacent stranding. There is also some asymmetric left perinephric stranding compared to right. This has has a differential. Possibilities would include infection including pyelonephritis. Please correlate for a recently passed stone or other urothelial process as well. Please correlate with clinical presentation. Stomach/Bowel: Underdistended stomach with slight fold thickening. The small and large bowel are nondilated. Scattered stool. Appendix not well seen in the right lower quadrant but no pericecal stranding or fluid.  Vascular/Lymphatic: Aortic atherosclerosis. No enlarged abdominal or pelvic lymph nodes. Reproductive: Status post hysterectomy. No adnexal masses. Other: No free air or free fluid.  Mild anasarca. Musculoskeletal: Curvature of the spine. Degenerative changes of the  spine and pelvis. Osteopenia. Diffuse fatty muscle atrophy. IMPRESSION: Development wall thickening of the bladder as well as thickening along the course of the urothelium of the left kidney with diffuse adjacent stranding. Slight ectasia of the left renal collecting system without renal stone. Possibilities would include cystitis with a potential pyelonephritis. Please correlate with clinical presentation. If there is concern of the sequela of pyelonephritis, a contrast study may be of some benefit as clinically appropriate otherwise would recommend follow up imaging to exclude other pathology as the etiology of the findings Electronically Signed   By: Karen Kays M.D.   On: 01/07/2023 19:58    Pending Labs Unresulted Labs (From admission, onward)     Start     Ordered   01/14/23 0500  Creatinine, serum  (enoxaparin (LOVENOX)    CrCl >/= 30 ml/min)  Weekly,   R     Comments: while on enoxaparin therapy    01/07/23 2101   01/08/23 0500  Basic metabolic panel  Tomorrow morning,   R        01/07/23 2101   01/08/23 0500  CBC with Differential/Platelet  Tomorrow morning,   R        01/07/23 2101   01/07/23 1718  Urine Culture  Once,   R        01/07/23 1718   01/07/23 1644  Blood culture (routine x 2)  BLOOD CULTURE X 2,   R      01/07/23 1643            Vitals/Pain Today's Vitals   01/07/23 1700 01/07/23 1817 01/07/23 1925 01/07/23 2000  BP: (!) 145/61  114/69   Pulse: (!) 55  (!) 55   Resp: 15  13   Temp:    98.3 F (36.8 C)  SpO2: 99%  97%   Weight:      Height:      PainSc:  5       Isolation Precautions No active isolations  Medications Medications  enoxaparin (LOVENOX) injection 40 mg (has no administration in time range)  senna-docusate (Senokot-S) tablet 1 tablet (has no administration in time range)  ondansetron (ZOFRAN) tablet 4 mg (has no administration in time range)    Or  ondansetron (ZOFRAN) injection 4 mg (has no administration in time range)  0.9 %   sodium chloride infusion (has no administration in time range)  cefTRIAXone (ROCEPHIN) 1 g in sodium chloride 0.9 % 100 mL IVPB (has no administration in time range)  atorvastatin (LIPITOR) tablet 40 mg (has no administration in time range)  carvedilol (COREG) tablet 25 mg (has no administration in time range)  levothyroxine (SYNTHROID) tablet 100 mcg (has no administration in time range)  mirabegron ER (MYRBETRIQ) tablet 50 mg (has no administration in time range)  irbesartan (AVAPRO) tablet 150 mg (has no administration in time range)  torsemide (DEMADEX) tablet 5 mg (has no administration in time range)  morphine (PF) 4 MG/ML injection 4 mg (4 mg Intravenous Given 01/07/23 1745)  ondansetron (ZOFRAN) injection 4 mg (4 mg Intravenous Given 01/07/23 1745)  lactated ringers bolus 1,000 mL (0 mLs Intravenous Stopped 01/07/23 2015)  cefTRIAXone (ROCEPHIN) 2 g in sodium chloride 0.9 % 100 mL IVPB (0 g Intravenous Stopped 01/07/23 2015)  lactated ringers  bolus 1,000 mL (1,000 mLs Intravenous New Bag/Given 01/07/23 2056)    Mobility- Bed bound     Focused Assessments    R Recommendations: See Admitting Provider Note  Report given to:   Additional Notes:

## 2023-01-07 NOTE — ED Provider Notes (Signed)
[facility] AT [facility] Provider Note   CSN: [id number] Arrival date & time: 01/07/23  1555     History  Chief Complaint  Patient presents with   Possible UTI    [name] is a 77 y.o. female.  HPI   Patient has a history of chronic back pain cardiomyopathy hypertension CHF anxiety, reflux, kidney stones, diabetes, postpolio syndrome.  Patient is unable to ambulate and has to use a wheelchair.  Patient states she has had issues with bladder infections off and on for an extended period of time.  Records indicate that she has been evaluated by urology who suspect she has chronic cystitis as well as neurogenic bladder disorder.  Their recommendation was for cystoscopy she started having pain over the last couple of days and her entire abdomen.  It does radiate to her back.  Patient has noticed some foul odor to her urine and questionable blood in her urine.  Patient states she also noticed possible blood in her stool when wiping this morning.  She has not had any vomiting.  No known fevers.  Home Medications Prior to Admission medications   Medication Sig Start Date End Date Taking? Authorizing Provider  amitriptyline (ELAVIL) 25 MG tablet Take 25 mg by mouth at bedtime.    [provider]  ascorbic acid (VITAMIN C) 500 MG tablet Take 500 mg by mouth daily.    [provider]  aspirin 81 MG tablet Take 81 mg by mouth at bedtime.     [provider]  atorvastatin (LIPITOR) 40 MG tablet Take 40 mg by mouth daily.    [provider]  b complex vitamins tablet Take 1 tablet by mouth daily.    [provider]  carvedilol (COREG) 25 MG tablet Take 25 mg by mouth 2 (two) times daily with a meal.    [provider]  clonazePAM (KLONOPIN) 0.5 MG tablet Take 0.5 mg by mouth 2 (two) times daily.    [provider]  gabapentin (NEURONTIN) 300 MG capsule Take 300 mg by mouth at bedtime.    [provider]  Iron, Ferrous Sulfate, 325 (65 Fe) MG TABS Take 1 tablet by mouth daily.    [provider]  levothyroxine (SYNTHROID, LEVOTHROID) 100 MCG tablet Take 100 mcg by mouth daily before breakfast.    [provider]  loratadine (CLARITIN) 10 MG tablet Take 10 mg by mouth daily. 12/17/19   [provider]  metFORMIN (GLUCOPHAGE-XR) 500 MG 24 hr tablet Take 1 tablet by mouth in the morning and at bedtime.  12/24/18   [provider]  mirabegron ER (MYRBETRIQ) 50 MG TB24 tablet Take 50 mg by mouth daily.    [provider]  Multiple Vitamins-Minerals (MULTIVITAMIN WITH MINERALS) tablet Take 1 tablet by mouth daily.    [provider]  nitroGLYCERIN (NITROSTAT) 0.4 MG SL tablet Place 1 tablet (0.4 mg total) under the tongue every 5 (five) minutes as needed for chest pain. 12/07/22   Alver Sorrow, NP  pantoprazole (PROTONIX) 40 MG tablet Take 1 tablet by mouth daily. Take 1 tab daily 12/21/14   [provider]  telmisartan (MICARDIS) 80 MG tablet Take 1 tablet by mouth daily. Take 1 tab daily 12/14/14   [provider]  tiZANidine (ZANAFLEX) 4 MG tablet Take 4 mg by mouth 5 (five) times daily as needed for muscle spasms. PAIN    [provider]  torsemide (DEMADEX) 5  MG tablet Take 5 mg by mouth daily.    [provider]  Vitamin D, Ergocalciferol, (DRISDOL) 1.25 MG (50000 UNIT) CAPS capsule Take 50,000 Units by mouth every 7 (seven) days.    [provider]  vitamin E 180 MG (400 UNITS) capsule Take 400 Units by mouth daily.    [provider]  Zinc 50 MG TABS Take 50 mg by mouth daily.    [provider]  zolpidem (AMBIEN) 5 MG tablet Take 5 mg by mouth at bedtime as needed for sleep.    [provider]      Allergies    Tamsulosin, Ciprofloxacin, Carisprodol [carisoprodol], Penicillins, Sulfa antibiotics, Acetaminophen, Macrodantin [nitrofurantoin macrocrystal],  Nsaids, Percocet [oxycodone-acetaminophen], Propoxyphene, and Propoxyphene n-acetaminophen    Review of Systems   Review of Systems  Physical Exam Updated Vital Signs BP 114/69   Pulse (!) 55   Temp 98.3 F (36.8 C)   Resp 13   Ht 1.524 m (5')   Wt 107.8 kg   SpO2 97%   BMI 46.40 kg/m  Physical Exam Vitals and nursing note reviewed.  Constitutional:      Appearance: She is well-developed. She is not diaphoretic.  HENT:     Head: Normocephalic and atraumatic.     Right Ear: External ear normal.     Left Ear: External ear normal.  Eyes:     General: No scleral icterus.       Right eye: No discharge.        Left eye: No discharge.     Conjunctiva/sclera: Conjunctivae normal.  Neck:     Trachea: No tracheal deviation.  Cardiovascular:     Rate and Rhythm: Normal rate and regular rhythm.  Pulmonary:     Effort: Pulmonary effort is normal. No respiratory distress.     Breath sounds: Normal breath sounds. No stridor. No wheezing or rales.  Abdominal:     General: Bowel sounds are normal. There is no distension.     Palpations: Abdomen is soft.     Tenderness: There is abdominal tenderness. There is no guarding or rebound.     Comments: Generalized tenderness  Musculoskeletal:        General: No tenderness or deformity.     Cervical back: Neck supple.     Right lower leg: Edema present.     Left lower leg: Edema present.  Skin:    General: Skin is warm and dry.     Findings: No rash.  Neurological:     Mental Status: She is alert.     Cranial Nerves: No cranial nerve deficit, dysarthria or facial asymmetry.     Sensory: No sensory deficit.     Motor: Weakness present. No abnormal muscle tone or seizure activity.     Coordination: Coordination normal.     Comments: Paraplegia  Psychiatric:        Mood and Affect: Mood normal.     ED Results / Procedures / Treatments   Labs (all labs ordered are listed, but only abnormal results are displayed) Labs Reviewed   COMPREHENSIVE METABOLIC PANEL - Abnormal; Notable for the following components:      Result Value   Sodium 133 (*)    Chloride 97 (*)    Glucose, Bld 145 (*)    Creatinine, Ser 0.40 (*)    Albumin 3.4 (*)    All other components within normal limits  CBC WITH DIFFERENTIAL/PLATELET - Abnormal; Notable for the following components:  WBC 17.8 (*)    Platelets 408 (*)    Neutro Abs 11.0 (*)    Monocytes Absolute 1.6 (*)    Eosinophils Absolute 1.2 (*)    Abs Immature Granulocytes 0.11 (*)    All other components within normal limits  URINALYSIS, W/ REFLEX TO CULTURE (INFECTION SUSPECTED) - Abnormal; Notable for the following components:   APPearance TURBID (*)    Hgb urine dipstick MODERATE (*)    Protein, ur 30 (*)    Nitrite POSITIVE (*)    Leukocytes,Ua LARGE (*)    Bacteria, UA FEW (*)    All other components within normal limits  I-STAT CG4 LACTIC ACID, ED - Abnormal; Notable for the following components:   Lactic Acid, Venous 2.5 (*)    All other components within normal limits  I-STAT CG4 LACTIC ACID, ED - Abnormal; Notable for the following components:   Lactic Acid, Venous 2.7 (*)    All other components within normal limits  CULTURE, BLOOD (ROUTINE X 2)  CULTURE, BLOOD (ROUTINE X 2)  URINE CULTURE  LIPASE, BLOOD    EKG EKG Interpretation Date/Time:  Sunday January 07 2023 16:06:22 EDT Ventricular Rate:  65 PR Interval:  149 QRS Duration:  85 QT Interval:  382 QTC Calculation: 398 R Axis:   22  Text Interpretation: Sinus rhythm Atrial premature complexes Abnormal R-wave progression, early transition Since last tracing rate faster Confirmed by Linwood Dibbles [phone]) on 01/07/2023 6:18:37 PM  Radiology CT Renal Stone Study  Result Date: 01/07/2023 CLINICAL DATA:  Cramping abdominal pain radiating to the back with foul-smelling urine. Blood in urine. EXAM: CT ABDOMEN AND PELVIS WITHOUT CONTRAST TECHNIQUE: Multidetector CT imaging of the abdomen and pelvis was  performed following the standard protocol without IV contrast. RADIATION DOSE REDUCTION: This exam was performed according to the departmental dose-optimization program which includes automated exposure control, adjustment of the mA and/or kV according to patient size and/or use of iterative reconstruction technique. COMPARISON:  CT 11/11/2022 and older FINDINGS: Lower chest: Slight linear opacity lung bases likely scar or atelectasis. No pleural effusion Hepatobiliary: On this non IV contrast exam, grossly preserved hepatic parenchyma only slight nodular contour previous cholecystectomy Pancreas: Unremarkable. No pancreatic ductal dilatation or surrounding inflammatory changes. Spleen: Normal in size without focal abnormality. Adrenals/Urinary Tract: Adrenal glands are preserved. Mild renal atrophy. The right kidney does not have a renal or ureteral stone. No collecting system dilatation. The right ureter has a normal course and caliber down to the bladder. The bladder wall is thickened and there is some stranding. Please correlate for cystitis. In addition there is mild ectasia of the left renal collecting system without renal or ureteral stone and adjacent stranding. There is also some asymmetric left perinephric stranding compared to right. This has has a differential. Possibilities would include infection including pyelonephritis. Please correlate for a recently passed stone or other urothelial process as well. Please correlate with clinical presentation. Stomach/Bowel: Underdistended stomach with slight fold thickening. The small and large bowel are nondilated. Scattered stool. Appendix not well seen in the right lower quadrant but no pericecal stranding or fluid. Vascular/Lymphatic: Aortic atherosclerosis. No enlarged abdominal or pelvic lymph nodes. Reproductive: Status post hysterectomy. No adnexal masses. Other: No free air or free fluid.  Mild anasarca. Musculoskeletal: Curvature of the spine.  Degenerative changes of the spine and pelvis. Osteopenia. Diffuse fatty muscle atrophy. IMPRESSION: Development wall thickening of the bladder as well as thickening along the course of the urothelium of the left kidney  with diffuse adjacent stranding. Slight ectasia of the left renal collecting system without renal stone. Possibilities would include cystitis with a potential pyelonephritis. Please correlate with clinical presentation. If there is concern of the sequela of pyelonephritis, a contrast study may be of some benefit as clinically appropriate otherwise would recommend follow up imaging to exclude other pathology as the etiology of the findings Electronically Signed   By: Karen Kays M.D.   On: 01/07/2023 19:58    Procedures Procedures    Medications Ordered in ED Medications  lactated ringers bolus 1,000 mL (has no administration in time range)  morphine (PF) 4 MG/ML injection 4 mg (4 mg Intravenous Given 01/07/23 1745)  ondansetron (ZOFRAN) injection 4 mg (4 mg Intravenous Given 01/07/23 1745)  lactated ringers bolus 1,000 mL (0 mLs Intravenous Stopped 01/07/23 2015)  cefTRIAXone (ROCEPHIN) 2 g in sodium chloride 0.9 % 100 mL IVPB (0 g Intravenous Stopped 01/07/23 2015)    ED Course/ Medical Decision Making/ A&P Clinical Course as of 01/07/23 2047  Sun Jan 07, 2023  1817 Urinalysis is suggestive of UTI.  Lactic Acid level is elevated at 5 [JK]  2025 Second lactic acid level increased at 2.7 [JK]  2025 CT scan shows thickening of the bladder as well as thickening of the urothelium of the left kidney with diffuse adjacent stranding.  No evidence of kidney stones but suggestive of possible pyelonephritis [JK]  2036 Lactic acid level slightly increased.  Will give additional fluid bolus.  Vital signs remained stable [JK]  2046 Case discussed with Dr Haroldine Laws regarding admission [JK]    Clinical Course User Index [JK] Linwood Dibbles, MD                                 Medical Decision  Making Differential diagnosis includes but not limited to diverticulitis, urinary tract infection, pyelonephritis, ureterolithiasis  Problems Addressed: Pyelonephritis: acute illness or injury that poses a threat to life or bodily functions  Amount and/or Complexity of Data Reviewed Labs: ordered. Decision-making details documented in ED Course. Radiology: ordered and independent interpretation performed.  Risk Prescription drug management. Decision regarding hospitalization.   Patient presented to emergency room for evaluation of abdominal pain.  Patient does have history of neurogenic bladder with frequent UTIs and kidney stones.  Patient had tenderness in her abdomen.  Labs notable for significant leukocytosis.  No signs of any acute renal failure.  Patient's urinalysis was also consistent with infection.  Patient's initial lactic acid level was also slightly elevated.  Patient however remained normotensive.  No tachycardia.  She was treated with IV fluids and antibiotics.  CT scan was performed and it shows findings consistent with pyelonephritis but no signs of any ureteral obstruction.  Patient second lactic acid level was slightly increased.  With her increasing lactic acid level history of prior sepsis we will plan on admission to hospital for close observation and further treatment.        Final Clinical Impression(s) / ED Diagnoses Final diagnoses:  Pyelonephritis    Rx / DC Orders ED Discharge Orders     None         Linwood Dibbles, MD 01/07/23 2047

## 2023-01-07 NOTE — H&P (Signed)
PCP:   Charlane Ferretti, DO   Chief Complaint:  Abdominal, flank pain  HPI: This is a 77 year old female with past medical history of HTN, HLD, postpolio syndrome wheelchair-bound, hypothyroidism, chronic back pain, GERD, HFpEF, atrial fibrillation, OSA, T2DM, h/o Takotsubo syndrome.  She presents with complaints of suprapubic, lower quadrant abdominal pain, cramping that radiated to her back.  This started today. She endorses burning urination and a foul odor.  She denies fevers or chills.  Patient is wheelchair-bound secondary to her postpolio syndrome.  She uses a  purewick.  She denies nausea or vomiting.  Patient has had recurrent UTIs since June, this is her third episode.  She started using purewick 6 months ago.  She just completed a 10-day course of oral antibiotics a week ago.  She additionally complains of shortness of breath which started today.  She denies cough or wheeze or chest pains.  In the ER patient's vitals within acceptable limits.  Lactic acid 2.5, increased to 2.7.  WBC 17.8.  UA compatible with infection.  CT stone study suggestive of pyelonephritis.  Review of Systems:  Per HPI  Past Medical History: Past Medical History:  Diagnosis Date   Angina pectoris (HCC) 08/02/2016   Anxiety    Arthritis    "hands, knees, back" (07/18/2013)   Asthma    CHF (congestive heart failure) (HCC)    Chronic back pain    Chronic lower back pain    Family history of anesthesia complication    Mother and Sister- N/V   GERD (gastroesophageal reflux disease)    H/O hiatal hernia    Headache(784.0)    "weekly" (07/18/2013)   Hypertension    Hypothyroidism    Insomnia    Kidney stones    Memory changes    Mental disorder    Migraine    "years ago" (07/18/2013)   Osteopenia    Post-polio syndrome    Pure hypercholesterolemia 02/07/2021   Sleep apnea    Takotsubo cardiomyopathy    Type II diabetes mellitus (HCC)    Ulcer of leg, chronic (HCC) 02/07/2021   Past Surgical History:   Procedure Laterality Date   ABDOMINAL HYSTERECTOMY     APPENDECTOMY     CARPAL TUNNEL RELEASE Bilateral    CHOLECYSTECTOMY     COLONOSCOPY     CYSTOSCOPY     IR INJECT/THERA/INC NEEDLE/CATH/PLC EPI/LUMB/SAC W/IMG  07/19/2022   IR INJECT/THERA/INC NEEDLE/CATH/PLC EPI/LUMB/SAC W/IMG  09/11/2022   IR INJECT/THERA/INC NEEDLE/CATH/PLC EPI/LUMB/SAC W/IMG  12/26/2022   KNEE ARTHROSCOPY Left    KNEE SURGERY Left    LEG SURGERY Left    polio   ORIF PATELLA Left 07/18/2013   Procedure: OPEN REDUCTION INTERNAL (ORIF) LEFT FIXATION PATELLA;  Surgeon: Verlee Rossetti, MD;  Location: [facility] OR;  Service: Orthopedics;  Laterality: Left;   ORIF PATELLA FRACTURE Left 07/18/2013   TUBAL LIGATION     WISDOM TOOTH EXTRACTION     WRIST TENDON TRANSFER Right     Medications: Prior to Admission medications   Medication Sig Start Date End Date Taking? Authorizing Provider  amitriptyline (ELAVIL) 25 MG tablet Take 25 mg by mouth at bedtime.    [provider]  ascorbic acid (VITAMIN C) 500 MG tablet Take 500 mg by mouth daily.    [provider]  aspirin 81 MG tablet Take 81 mg by mouth at bedtime.     [provider]  atorvastatin (LIPITOR) 40 MG tablet Take 40 mg by mouth daily.  [provider]  b complex vitamins tablet Take 1 tablet by mouth daily.    [provider]  carvedilol (COREG) 25 MG tablet Take 25 mg by mouth 2 (two) times daily with a meal.    [provider]  clonazePAM (KLONOPIN) 0.5 MG tablet Take 0.5 mg by mouth 2 (two) times daily.    [provider]  gabapentin (NEURONTIN) 300 MG capsule Take 300 mg by mouth at bedtime.    [provider]  Iron, Ferrous Sulfate, 325 (65 Fe) MG TABS Take 1 tablet by mouth daily.    [provider]  levothyroxine (SYNTHROID, LEVOTHROID) 100 MCG tablet Take 100 mcg by mouth daily before breakfast.    [provider]  loratadine (CLARITIN) 10 MG tablet Take 10 mg by mouth  daily. 12/17/19   [provider]  metFORMIN (GLUCOPHAGE-XR) 500 MG 24 hr tablet Take 1 tablet by mouth in the morning and at bedtime.  12/24/18   [provider]  mirabegron ER (MYRBETRIQ) 50 MG TB24 tablet Take 50 mg by mouth daily.    [provider]  Multiple Vitamins-Minerals (MULTIVITAMIN WITH MINERALS) tablet Take 1 tablet by mouth daily.    [provider]  nitroGLYCERIN (NITROSTAT) 0.4 MG SL tablet Place 1 tablet (0.4 mg total) under the tongue every 5 (five) minutes as needed for chest pain. 12/07/22   Alver Sorrow, NP  pantoprazole (PROTONIX) 40 MG tablet Take 1 tablet by mouth daily. Take 1 tab daily 12/21/14   [provider]  telmisartan (MICARDIS) 80 MG tablet Take 1 tablet by mouth daily. Take 1 tab daily 12/14/14   [provider]  tiZANidine (ZANAFLEX) 4 MG tablet Take 4 mg by mouth 5 (five) times daily as needed for muscle spasms. PAIN    [provider]  torsemide (DEMADEX) 5 MG tablet Take 5 mg by mouth daily.    [provider]  Vitamin D, Ergocalciferol, (DRISDOL) 1.25 MG (50000 UNIT) CAPS capsule Take 50,000 Units by mouth every 7 (seven) days.    [provider]  vitamin E 180 MG (400 UNITS) capsule Take 400 Units by mouth daily.    [provider]  Zinc 50 MG TABS Take 50 mg by mouth daily.    [provider]  zolpidem (AMBIEN) 5 MG tablet Take 5 mg by mouth at bedtime as needed for sleep.    [provider]    Allergies:   Allergies  Allergen Reactions   Tamsulosin Other (See Comments)    Caused uncontrollable movements   Ciprofloxacin Rash   Carisprodol [Carisoprodol] Itching   Penicillins Itching    Pt has tolerated rocephin IV   Sulfa Antibiotics Other (See Comments)    Unknown.  States as always told had an allergy   Acetaminophen Itching   Macrodantin [Nitrofurantoin Macrocrystal] Rash   Nsaids Other (See Comments)    Chest pain and stomach pain    Percocet [Oxycodone-Acetaminophen] Itching   Propoxyphene Other (See Comments)    Chest/stomach pain   Propoxyphene N-Acetaminophen Other (See Comments)    Chest/stomach pain    Social History:  reports that she has never smoked. She has never used smokeless tobacco. She reports that she does not drink alcohol and does not use drugs.  Family History: Family History  Problem Relation Age of Onset   Heart failure Mother    Parkinson's disease Mother    Diabetes Father    Cancer Father    Heart failure  Father    Breast cancer Maternal Grandmother    Allergies Grandchild     Physical Exam: Vitals:   01/07/23 1600 01/07/23 1700 01/07/23 1925 01/07/23 2000  BP: (!) 148/60 (!) 145/61 114/69   Pulse: (!) 123 (!) 55 (!) 55   Resp: 17 15 13    Temp: 98.3 F (36.8 C)   98.3 F (36.8 C)  SpO2: 100% 99% 97%   Weight: 107.8 kg     Height: 5' (1.524 m)       General: A and O x 3, morbidly obese no acute distress Eyes: Pink conjunctiva, no scleral icterus ENT: Moist oral mucosa, neck supple, no thyromegaly Lungs: CTA B/L, no wheeze, no crackles, no use of accessory muscles Cardiovascular: RRR, no regurgitation, no gallops, no murmurs. No carotid bruits, no JVD Abdomen: soft, positive BS, no significant abdominal tenderness, flank tenderness not appreciated GU: not examined Neuro: CN II - XII grossly intact Musculoskeletal: Strength B/L lower extremities 0/5.  Flaccid legs w/ B/L foot drop.  Strength B/L upper extremities at 5/5 Skin: Sacral decub.  LLE ulcer on shin Psych: appropriate patient  Labs on Admission:  Recent Labs    01/07/23 1750  NA 133*  K 4.1  CL 97*  CO2 25  GLUCOSE 145*  BUN 10  CREATININE 0.40*  CALCIUM 8.9   Recent Labs    01/07/23 1750  AST 26  ALT 23  ALKPHOS 60  BILITOT 1.0  PROT 7.0  ALBUMIN 3.4*   Recent Labs    01/07/23 1750  LIPASE 20   Recent Labs    01/07/23 1750  WBC 17.8*  NEUTROABS 11.0*  HGB 12.8  HCT 39.5  MCV 94.3   PLT 408*    Radiological Exams on Admission: CT Renal Stone Study  Result Date: 01/07/2023 CLINICAL DATA:  Cramping abdominal pain radiating to the back with foul-smelling urine. Blood in urine. EXAM: CT ABDOMEN AND PELVIS WITHOUT CONTRAST TECHNIQUE: Multidetector CT imaging of the abdomen and pelvis was performed following the standard protocol without IV contrast. RADIATION DOSE REDUCTION: This exam was performed according to the departmental dose-optimization program which includes automated exposure control, adjustment of the mA and/or kV according to patient size and/or use of iterative reconstruction technique. COMPARISON:  CT 11/11/2022 and older FINDINGS: Lower chest: Slight linear opacity lung bases likely scar or atelectasis. No pleural effusion Hepatobiliary: On this non IV contrast exam, grossly preserved hepatic parenchyma only slight nodular contour previous cholecystectomy Pancreas: Unremarkable. No pancreatic ductal dilatation or surrounding inflammatory changes. Spleen: Normal in size without focal abnormality. Adrenals/Urinary Tract: Adrenal glands are preserved. Mild renal atrophy. The right kidney does not have a renal or ureteral stone. No collecting system dilatation. The right ureter has a normal course and caliber down to the bladder. The bladder wall is thickened and there is some stranding. Please correlate for cystitis. In addition there is mild ectasia of the left renal collecting system without renal or ureteral stone and adjacent stranding. There is also some asymmetric left perinephric stranding compared to right. This has has a differential. Possibilities would include infection including pyelonephritis. Please correlate for a recently passed stone or other urothelial process as well. Please correlate with clinical presentation. Stomach/Bowel: Underdistended stomach with slight fold thickening. The small and large bowel are nondilated. Scattered stool. Appendix not well seen  in the right lower quadrant but no pericecal stranding or fluid. Vascular/Lymphatic: Aortic atherosclerosis. No enlarged abdominal or pelvic lymph nodes. Reproductive: Status post hysterectomy. No adnexal masses.  Other: No free air or free fluid.  Mild anasarca. Musculoskeletal: Curvature of the spine. Degenerative changes of the spine and pelvis. Osteopenia. Diffuse fatty muscle atrophy. IMPRESSION: Development wall thickening of the bladder as well as thickening along the course of the urothelium of the left kidney with diffuse adjacent stranding. Slight ectasia of the left renal collecting system without renal stone. Possibilities would include cystitis with a potential pyelonephritis. Please correlate with clinical presentation. If there is concern of the sequela of pyelonephritis, a contrast study may be of some benefit as clinically appropriate otherwise would recommend follow up imaging to exclude other pathology as the etiology of the findings Electronically Signed   By: Karen Kays M.D.   On: 01/07/2023 19:58    Assessment/Plan Present on Admission:  Acute pyelonephritis, left kidney -Blood and urine cultures collected -Continue IV Rocephin -Dilaudid as needed pain   Shortness of breath -Likely secondary to pain.  D-dimer, BNP, CXR normal -Lungs CTA.  Satting 100% on room air. -Pain meds ordered, monitor   Sacral decub POA //  LLE ulcer shin POA -Wound care consult placed. -Patient follows with wound care for pressure ulcer on the extremity.  Per patient she was just cleared by wound care today as wound that close   Essential hypertension -Avapro, Coreg resumed   Hyperlipidemia -Atorvastatin resumed   Hypothyroidism -Synthroid resumed   Postpolio syndrome  Medora Roorda 01/07/2023, 8:55 PM

## 2023-01-07 NOTE — Progress Notes (Signed)
Pt is up to unit from the ED via stretcher. She appears warm, dry, no visible distress.

## 2023-01-08 ENCOUNTER — Encounter (HOSPITAL_COMMUNITY): Payer: Self-pay | Admitting: Family Medicine

## 2023-01-08 ENCOUNTER — Inpatient Hospital Stay (HOSPITAL_COMMUNITY): Payer: Medicare Other

## 2023-01-08 DIAGNOSIS — N10 Acute tubulo-interstitial nephritis: Secondary | ICD-10-CM | POA: Diagnosis not present

## 2023-01-08 LAB — CBC WITH DIFFERENTIAL/PLATELET
Abs Immature Granulocytes: 0.07 10*3/uL (ref 0.00–0.07)
Basophils Absolute: 0.1 10*3/uL (ref 0.0–0.1)
Basophils Relative: 1 %
Eosinophils Absolute: 1.1 10*3/uL — ABNORMAL HIGH (ref 0.0–0.5)
Eosinophils Relative: 7 %
HCT: 39.4 % (ref 36.0–46.0)
Hemoglobin: 12.5 g/dL (ref 12.0–15.0)
Immature Granulocytes: 0 %
Lymphocytes Relative: 22 %
Lymphs Abs: 3.5 10*3/uL (ref 0.7–4.0)
MCH: 30 pg (ref 26.0–34.0)
MCHC: 31.7 g/dL (ref 30.0–36.0)
MCV: 94.5 fL (ref 80.0–100.0)
Monocytes Absolute: 1.4 10*3/uL — ABNORMAL HIGH (ref 0.1–1.0)
Monocytes Relative: 9 %
Neutro Abs: 10.1 10*3/uL — ABNORMAL HIGH (ref 1.7–7.7)
Neutrophils Relative %: 61 %
Platelets: 380 10*3/uL (ref 150–400)
RBC: 4.17 MIL/uL (ref 3.87–5.11)
RDW: 14.6 % (ref 11.5–15.5)
WBC: 16.2 10*3/uL — ABNORMAL HIGH (ref 4.0–10.5)
nRBC: 0 % (ref 0.0–0.2)

## 2023-01-08 LAB — LACTIC ACID, PLASMA
Lactic Acid, Venous: 1.9 mmol/L (ref 0.5–1.9)
Lactic Acid, Venous: 3 mmol/L (ref 0.5–1.9)
Lactic Acid, Venous: 2.2 mmol/L (ref 0.5–1.9)

## 2023-01-08 LAB — BASIC METABOLIC PANEL
Anion gap: 8 (ref 5–15)
BUN: 9 mg/dL (ref 8–23)
CO2: 26 mmol/L (ref 22–32)
Calcium: 8.7 mg/dL — ABNORMAL LOW (ref 8.9–10.3)
Chloride: 100 mmol/L (ref 98–111)
Creatinine, Ser: 0.46 mg/dL (ref 0.44–1.00)
GFR, Estimated: >60 mL/min (ref >60)
Glucose, Bld: 167 mg/dL — ABNORMAL HIGH (ref 70–99)
Potassium: 3.5 mmol/L (ref 3.5–5.1)
Sodium: 134 mmol/L — ABNORMAL LOW (ref 135–145)

## 2023-01-08 MED ORDER — SACCHAROMYCES BOULARDII 250 MG PO CAPS
250.0000 mg | ORAL_CAPSULE | Freq: Every day | ORAL | Status: DC
  Administered 2023-01-08 – 2023-01-11 (×4): 250 mg via ORAL
  Filled 2023-01-08 (×4): qty 1

## 2023-01-08 MED ORDER — LACTATED RINGERS IV BOLUS
1000.0000 mL | Freq: Once | INTRAVENOUS | Status: AC
  Administered 2023-01-08: 1000 mL via INTRAVENOUS

## 2023-01-08 MED ORDER — GABAPENTIN 100 MG PO CAPS
300.0000 mg | ORAL_CAPSULE | Freq: Every day | ORAL | Status: DC
  Administered 2023-01-08 – 2023-01-10 (×3): 300 mg via ORAL
  Filled 2023-01-08 (×3): qty 3

## 2023-01-08 MED ORDER — FERROUS SULFATE 325 (65 FE) MG PO TABS
325.0000 mg | ORAL_TABLET | Freq: Every day | ORAL | Status: DC
  Administered 2023-01-09 – 2023-01-11 (×3): 325 mg via ORAL
  Filled 2023-01-08 (×3): qty 1

## 2023-01-08 MED ORDER — HYDROXYZINE HCL 25 MG PO TABS
25.0000 mg | ORAL_TABLET | Freq: Four times a day (QID) | ORAL | Status: DC
  Administered 2023-01-08 – 2023-01-11 (×13): 25 mg via ORAL
  Filled 2023-01-08 (×13): qty 1

## 2023-01-08 MED ORDER — CLONAZEPAM 0.5 MG PO TABS
0.5000 mg | ORAL_TABLET | Freq: Two times a day (BID) | ORAL | Status: DC
  Administered 2023-01-08 – 2023-01-11 (×7): 0.5 mg via ORAL
  Filled 2023-01-08 (×7): qty 1

## 2023-01-08 MED ORDER — ASPIRIN 81 MG PO TABS: 81.0000 mg | ORAL_TABLET | Freq: Every day | ORAL | Status: DC

## 2023-01-08 MED ORDER — AMITRIPTYLINE HCL 25 MG PO TABS
25.0000 mg | ORAL_TABLET | Freq: Every day | ORAL | Status: DC
  Administered 2023-01-08 – 2023-01-10 (×3): 25 mg via ORAL
  Filled 2023-01-08 (×4): qty 1

## 2023-01-08 MED ORDER — ASPIRIN 81 MG PO TBEC
81.0000 mg | DELAYED_RELEASE_TABLET | Freq: Every day | ORAL | Status: DC
  Administered 2023-01-08 – 2023-01-10 (×3): 81 mg via ORAL
  Filled 2023-01-08 (×3): qty 1

## 2023-01-08 MED ORDER — LACTATED RINGERS IV BOLUS
500.0000 mL | Freq: Once | INTRAVENOUS | Status: AC
  Administered 2023-01-08: 500 mL via INTRAVENOUS

## 2023-01-08 MED ORDER — TIZANIDINE HCL 4 MG PO TABS: 4.0000 mg | ORAL_TABLET | Freq: Every day | ORAL | Status: DC | PRN

## 2023-01-08 MED ORDER — HYDROXYZINE PAMOATE 25 MG PO CAPS
25.0000 mg | ORAL_CAPSULE | Freq: Four times a day (QID) | ORAL | Status: DC
  Filled 2023-01-08 (×2): qty 1

## 2023-01-08 MED ORDER — IRON (FERROUS SULFATE) 325 (65 FE) MG PO TABS: 1.0000 | ORAL_TABLET | Freq: Every day | ORAL | Status: DC

## 2023-01-08 MED ORDER — ZINC OXIDE 40 % EX OINT
TOPICAL_OINTMENT | Freq: Three times a day (TID) | CUTANEOUS | Status: DC
  Filled 2023-01-08: qty 57

## 2023-01-08 NOTE — Consult Note (Signed)
WOC Nurse Consult Note: Reason for Consult:right sacrum and left pretibial area partial thickness wound Wound type: pressure, trauma, irritant contact dermatitis ICD-10 CM Codes for Irritant Dermatitis  L24A2 - Due to fecal, urinary or dual incontinence L24A9 - Due to friction or contact with other specified body fluids L30.4  - Erythema intertrigo. Also used for abrasion of the hand, chafing of the skin, dermatitis due to sweating and friction, friction dermatitis, friction eczema, and genital/thigh intertrigo.   Pressure Injury POA: Yes Measurement: Sacral: 3cm x 4xm areas of recently healed stage 3 pressure injury. No depth, no drainage. Maceration due to incontinence. LLE:  4.2cm x 2.5cm x 0.2cm with red wound bed and small serosanguinous exudate. According to daughter, area had recently reepithelialized, then reopened. Wound bed:AS described above Drainage (amount, consistency, odor) As described above Periwound: intact, macerated as noted above on the buttocks. Dressing procedure/placement/frequency: I have provided Nursing with guidance for the care of the skin using bilateral, pressure redistribution heel boots and turning and repositioning to minimize time in the supine position. Topical care to the LLE will be with xeroform gauze, dry gauze, ABD pad and securement with Kerlix/paper tape as that is the family's preference. Topical care to the recently healed sacral wound will be with xeroform and a silicone foam dressing. Desitin cream will be applied to the perineal area, buttock, medial and posterior thighs three times daily and PRN soling. House antimicrobial textile (InterDry) is to be used for the subpannicular skin folds with maceration.  WOC nursing team will not follow, but will remain available to this patient, the nursing and medical teams.  Please re-consult if needed.  Thank you for inviting [place] to participate in this patient's Plan of Care.  Ladona Mow, MSN, RN, CNS,  GNP, Leda Min, [employer], [employer] phone:  [phone]

## 2023-01-08 NOTE — Progress Notes (Signed)
RN messaged Dr. Sunnie Nielsen and made aware of patient's critical result, Lactic Acid 3.0

## 2023-01-08 NOTE — Progress Notes (Signed)
PROGRESS NOTE    [name]  RUE:[id number] DOB: 1946-03-14 DOA: 01/07/2023 PCP: Charlane Ferretti, DO   Brief Narrative: 88 past medical history significant for hypertension, hyperlipidemia, postpolio syndrome wheelchair-bound, hypothyroidism, chronic back pain, GERD, heart failure preserved ejection fraction, A-fib, OSA, diabetes type 2, history of Takotsubo syndrome presenting with suprapubic lower quadrant abdominal pain radiating to her back.  She also endorses dysuria.  Last treated for UTI in June.  Patient admitted with pyelonephritis, CT study protocol suggestive of pyelo-.  Also found to have lactic acidosis.   Assessment & Plan:   Principal Problem:   Acute pyelonephritis Active Problems:   Essential hypertension   Hyperlipidemia   Hypothyroidism   Post-polio syndrome   ATRIAL FIBRILLATION   T2DM (type 2 diabetes mellitus) (HCC)  1-Acute Pyelonephritis of the left kidney: Presenting with back pain, suprapubic pain, dysuria.  UA with more than 50 white blood cell.  Cytosis Follow urine culture Continue with IV ceftriaxone White blood cell down to 16 from 17.  2-Lactic acidosis: In the setting of infection.  Plan to repeat lactic acid. Received IV fluids  3-Hypertension: Continue with carvedilol, Avapro.  Will hold torsemide in the setting of infection  4-History of A-fib: Continue with carvedilol  Dyspnea: In the setting of pain D-dimer, chest x-ray, BNP normal  Hyperlipidemia: Continue with atorvastatin  Hypothyroidism: Continue with Synthroid  [place] syndrome: Continue with supportive care: Will get PT OT, might benefit from home health aide  Right sacrum left pretibial area partial-thickness 1: Present on admission, due to contact dermatitis, recently healed with stage III pressure injury noted, no drainage maceration due to incontinence.  Left lower extremity 4 x 2.5 x 0.2 cm wound: Present on admission: -Continue with local care  Mild hyponatremia;  monitor.   Estimated body mass index is 46.4 kg/m as calculated from the following:   Height as of this encounter: 5' (1.524 m).   Weight as of this encounter: 107.8 kg.   DVT prophylaxis: Lovenox Code Status: Full code Family Communication: daughter and husband at bedside.  Disposition Plan:  Status is: Inpatient Remains inpatient appropriate because: management of Pyelonephritis.     Consultants:  None  Procedures:  none  Antimicrobials:    Subjective: She is alert, doing ok, pain improved.   Objective: Vitals:   01/07/23 2100 01/08/23 0012 01/08/23 0158 01/08/23 0605  BP: (!) 155/73  (!) 156/72 (!) 153/58  Pulse: 78  60 (!) 55  Resp: 14  18 17   Temp:   97.9 F (36.6 C) 98.8 F (37.1 C)  TempSrc:   Oral Oral  SpO2: 99% 98% 96% 99%  Weight:      Height:        Intake/Output Summary (Last 24 hours) at 01/08/2023 1101 Last data filed at 01/08/2023 1002 Gross per 24 hour  Intake 2371.67 ml  Output 1850 ml  Net 521.67 ml   Filed Weights   01/07/23 1600  Weight: 107.8 kg    Examination:  General exam: Appears calm and comfortable  Respiratory system: Clear to auscultation. Respiratory effort normal. Cardiovascular system: S1 & S2 heard, RRR. No JVD, murmurs, rubs, gallops or clicks. No pedal edema. Gastrointestinal system: Abdomen is nondistended, soft and nontender. No organomegaly or masses felt. Normal bowel sounds heard. Central nervous system: Alert and oriented    Data Reviewed: I have personally reviewed following labs and imaging studies  CBC: Recent Labs  Lab 01/07/23 1750 01/08/23 0507  WBC 17.8* 16.2*  NEUTROABS 11.0* 10.1*  HGB 12.8 12.5  HCT 39.5 39.4  MCV 94.3 94.5  PLT 408* 380   Basic Metabolic Panel: Recent Labs  Lab 01/07/23 1750 01/08/23 0507  NA 133* 134*  K 4.1 3.5  CL 97* 100  CO2 25 26  GLUCOSE 145* 167*  BUN 10 9  CREATININE 0.40* 0.46  CALCIUM 8.9 8.7*   GFR: Estimated Creatinine Clearance: 63.4 mL/min  (by C-G formula based on SCr of 0.46 mg/dL). Liver Function Tests: Recent Labs  Lab 01/07/23 1750  AST 26  ALT 23  ALKPHOS 60  BILITOT 1.0  PROT 7.0  ALBUMIN 3.4*   Recent Labs  Lab 01/07/23 1750  LIPASE 20   No results for input(s): "AMMONIA" in the last 168 hours. Coagulation Profile: No results for input(s): "INR", "PROTIME" in the last 168 hours. Cardiac Enzymes: No results for input(s): "CKTOTAL", "CKMB", "CKMBINDEX", "TROPONINI" in the last 168 hours. BNP (last 3 results) No results for input(s): "PROBNP" in the last 8760 hours. HbA1C: No results for input(s): "HGBA1C" in the last 72 hours. CBG: No results for input(s): "GLUCAP" in the last 168 hours. Lipid Profile: No results for input(s): "CHOL", "HDL", "LDLCALC", "TRIG", "CHOLHDL", "LDLDIRECT" in the last 72 hours. Thyroid Function Tests: No results for input(s): "TSH", "T4TOTAL", "FREET4", "T3FREE", "THYROIDAB" in the last 72 hours. Anemia Panel: No results for input(s): "VITAMINB12", "FOLATE", "FERRITIN", "TIBC", "IRON", "RETICCTPCT" in the last 72 hours. Sepsis Labs: Recent Labs  Lab 01/07/23 1805 01/07/23 2022  LATICACIDVEN 2.5* 2.7*    Recent Results (from the past 240 hour(s))  Blood culture (routine x 2)     Status: None (Preliminary result)   Collection Time: 01/07/23  5:40 PM   Specimen: BLOOD  Result Value Ref Range Status   Specimen Description   Final    BLOOD LEFT ANTECUBITAL Performed at [facility], [address], [place], [place] [address]    Special Requests   Final    BOTTLES DRAWN AEROBIC AND ANAEROBIC Blood Culture adequate volume Performed at [facility], [address], [place], [place] [address]    Culture PENDING  Incomplete   Report Status PENDING  Incomplete         Radiology Studies: DG CHEST PORT 1 VIEW  Result Date: 01/07/2023 CLINICAL DATA:  Shortness of breath and chest pain. EXAM: PORTABLE CHEST 1 VIEW COMPARISON:  Chest radiograph  dated 11/11/2022. FINDINGS: Shallow inspiration. No focal consolidation, pleural effusion, or pneumothorax. Stable cardiac silhouette. Atherosclerotic calcification of the aorta. No acute osseous pathology. Degenerative changes of the spine and left shoulder IMPRESSION: No active disease. Electronically Signed   By: Elgie Collard M.D.   On: 01/07/2023 22:13   CT Renal Stone Study  Result Date: 01/07/2023 CLINICAL DATA:  Cramping abdominal pain radiating to the back with foul-smelling urine. Blood in urine. EXAM: CT ABDOMEN AND PELVIS WITHOUT CONTRAST TECHNIQUE: Multidetector CT imaging of the abdomen and pelvis was performed following the standard protocol without IV contrast. RADIATION DOSE REDUCTION: This exam was performed according to the departmental dose-optimization program which includes automated exposure control, adjustment of the mA and/or kV according to patient size and/or use of iterative reconstruction technique. COMPARISON:  CT 11/11/2022 and older FINDINGS: Lower chest: Slight linear opacity lung bases likely scar or atelectasis. No pleural effusion Hepatobiliary: On this non IV contrast exam, grossly preserved hepatic parenchyma only slight nodular contour previous cholecystectomy Pancreas: Unremarkable. No pancreatic ductal dilatation or surrounding inflammatory changes. Spleen: Normal in size without focal abnormality.  Adrenals/Urinary Tract: Adrenal glands are preserved. Mild renal atrophy. The right kidney does not have a renal or ureteral stone. No collecting system dilatation. The right ureter has a normal course and caliber down to the bladder. The bladder wall is thickened and there is some stranding. Please correlate for cystitis. In addition there is mild ectasia of the left renal collecting system without renal or ureteral stone and adjacent stranding. There is also some asymmetric left perinephric stranding compared to right. This has has a differential. Possibilities would  include infection including pyelonephritis. Please correlate for a recently passed stone or other urothelial process as well. Please correlate with clinical presentation. Stomach/Bowel: Underdistended stomach with slight fold thickening. The small and large bowel are nondilated. Scattered stool. Appendix not well seen in the right lower quadrant but no pericecal stranding or fluid. Vascular/Lymphatic: Aortic atherosclerosis. No enlarged abdominal or pelvic lymph nodes. Reproductive: Status post hysterectomy. No adnexal masses. Other: No free air or free fluid.  Mild anasarca. Musculoskeletal: Curvature of the spine. Degenerative changes of the spine and pelvis. Osteopenia. Diffuse fatty muscle atrophy. IMPRESSION: Development wall thickening of the bladder as well as thickening along the course of the urothelium of the left kidney with diffuse adjacent stranding. Slight ectasia of the left renal collecting system without renal stone. Possibilities would include cystitis with a potential pyelonephritis. Please correlate with clinical presentation. If there is concern of the sequela of pyelonephritis, a contrast study may be of some benefit as clinically appropriate otherwise would recommend follow up imaging to exclude other pathology as the etiology of the findings Electronically Signed   By: Karen Kays M.D.   On: 01/07/2023 19:58        Scheduled Meds:  atorvastatin  40 mg Oral Daily   carvedilol  25 mg Oral BID WC   enoxaparin (LOVENOX) injection  40 mg Subcutaneous Q24H   irbesartan  150 mg Oral Daily   levothyroxine  100 mcg Oral Q0600   liver oil-zinc oxide   Topical TID   mirabegron ER  50 mg Oral Daily   Continuous Infusions:  sodium chloride 100 mL/hr at 01/08/23 0803   cefTRIAXone (ROCEPHIN)  IV 1 g (01/08/23 0806)   lactated ringers       LOS: 1 day    Time spent: 35 minutes    Mikhail Hallenbeck A Shamica Moree, MD [facility]   If 7PM-7AM, please contact  night-coverage [email or web address]  01/08/2023, 11:01 AM

## 2023-01-08 NOTE — Evaluation (Signed)
Physical Therapy Evaluation Patient Details Name: [name] MRN: [id number] DOB: 06-13-1945 Today's Date: 01/08/2023  History of Present Illness  This is a 77 year old female with past medical history of HTN, HLD, postpolio syndrome wheelchair-bound, hypothyroidism, chronic back pain, GERD, HFpEF, atrial fibrillation, OSA, T2DM, h/o Takotsubo syndrome, L patella ORIF 2015.  She presents 01/07/23 with complaints of suprapubic, lower quadrant abdominal pain, cramping that radiated to her back,  endorses burning urination and a foul odor.  .  Patient is wheelchair-bound secondary to her postpolio syndrome.  She uses a   purewick.  Patient has had recurrent UTIs since June, this is her third episode.Found to have Acute pyelonephritis, left kidney  Clinical Impression  Pt admitted with above diagnosis.  Pt currently with functional limitations due to the deficits listed below (see PT Problem List). Pt will benefit from acute skilled PT to increase their independence and safety with mobility to allow discharge.      Patient reports that she  spends most of time in lift chair, tranfers  to WC/BSC with anterior/posterior approach with spouse/family assisting. Patient has a Engineer, maintenance (IT).  Patient reports that she has a Roho cushion in storage(she thinks) which PT highly recommends that she use it if it is in proper working condition.  PT recommends an air mattress for home  and recommends that patient  spend some time in bed and on her side versus in  lift chair , although the patient states that she gets propped on her side in the recliner. Patient and daughter report that recently the left knee bends  whereas it has been straight for many years. Noted that patient has a history of left patella fracture/ ORIF. Patient reports the knee is painful. Recommend  an xray of the knee.         If plan is discharge home, recommend the following: A lot of help with bathing/dressing/bathroom;A lot of help  with walking and/or transfers;Help with stairs or ramp for entrance;Assist for transportation;Assistance with cooking/housework   Can travel by private vehicle        Equipment Recommendations  (air mattress for home)  Recommendations for Other Services       Functional Status Assessment Patient has had a recent decline in their functional status and/or demonstrates limited ability to make significant improvements in function in a reasonable and predictable amount of time     Precautions / Restrictions Precautions Precaution Comments: LE paralysis, L knee painful, says it was not bending, now the knee bends, H/O L patella  fracture 2015 Restrictions RLE Weight Bearing: Non weight bearing LLE Weight Bearing: Non weight bearing      Mobility  Bed Mobility Overal bed mobility: Needs Assistance Bed Mobility: Rolling, Supine to Sit, Sit to Sidelying Rolling: Used rails, Max assist, +2 for physical assistance, +2 for safety/equipment   Supine to sit: Used rails, +2 for safety/equipment, +2 for physical assistance, Total assist, HOB elevated   Sit to sidelying: Total assist, +2 for physical assistance, +2 for safety/equipment General bed mobility comments: support lower body to  roll, able to reach with LUE for rail, unable to reach with RUE to roll. Purple slide placed under buttocks to facilitate sliding  on bed  using bed pad. max assist to sit upright.    Transfers                        Ambulation/Gait  Stairs            Wheelchair Mobility     Tilt Bed    Modified Rankin (Stroke Patients Only)       Balance Overall balance assessment: Needs assistance Sitting-balance support: Bilateral upper extremity supported, Feet unsupported Sitting balance-Leahy Scale: Poor Sitting balance - Comments: reliant on gripping mattress and close guard                                     Pertinent Vitals/Pain Pain  Assessment Pain Assessment: Faces Faces Pain Scale: Hurts whole lot Pain Location: left knee, buttocks Pain Descriptors / Indicators: Discomfort Pain Intervention(s): Limited activity within patient's tolerance, Monitored during session, Patient requesting pain meds-RN notified, Repositioned    Home Living Family/patient expects to be discharged to:: Private residence Living Arrangements: Spouse/significant other;Children Available Help at Discharge: Family;Available 24 hours/day Type of Home: House Home Access: Ramped entrance       Home Layout: One level Home Equipment: Wheelchair - Runner, broadcasting/film/video;Hospital bed Additional Comments: lift chair, hoyer lift    Prior Function Prior Level of Function : Needs assist       Physical Assist : Mobility (physical);ADLs (physical) Mobility (physical): Bed mobility;Transfers   Mobility Comments: patien scoots forward onto bari BSC backwards (legs pass through the back bar) then scoots back into WC/scooter. Sleeps in recliner., spouse  placed a tub in garage and patient is lifted down into it with [employer] lift, has adaptive truck that [facility] will  lift into       Extremity/Trunk Assessment   Upper Extremity Assessment Upper Extremity Assessment:  (noted lacks active elevation of the  [place])    Lower Extremity Assessment Lower Extremity Assessment: LLE deficits/detail;RLE deficits/detail RLE Deficits / Details: noted  atrophy , minimal hip rotation, plantar flexed contracture RLE Sensation: decreased proprioception LLE Deficits / Details: flaccid, plantar flexed contracture, can internally rotate,  knee flexion tolerated to ! 40* in sitting.    Cervical / Trunk Assessment Cervical / Trunk Assessment: Kyphotic  Communication   Communication Communication: No apparent difficulties  Cognition Arousal: Alert Behavior During Therapy: WFL for tasks assessed/performed Overall Cognitive Status: Within Functional Limits  for tasks assessed                                 General Comments: daughter corrected some of the  recent history        General Comments      Exercises     Assessment/Plan    PT Assessment Patient needs continued PT services  PT Problem List Decreased strength;Decreased mobility;Decreased range of motion;Obesity;Decreased activity tolerance;Decreased balance;Pain       PT Treatment Interventions Therapeutic activities;Patient/family education;Functional mobility training;Balance training    PT Goals (Current goals can be found in the Care Plan section)  Acute Rehab PT Goals Patient Stated Goal: to sit up more and not get sore on bottom PT Goal Formulation: With patient/family Time For Goal Achievement: 01/22/23 Potential to Achieve Goals: Fair    Frequency Min 1X/week     Co-evaluation               AM-PAC PT "6 Clicks" Mobility  Outcome Measure Help needed turning from your back to your side while in a flat bed without using bedrails?: Total Help needed moving from lying on your back to sitting on  the side of a flat bed without using bedrails?: Total Help needed moving to and from a bed to a chair (including a wheelchair)?: Total Help needed standing up from a chair using your arms (e.g., wheelchair or bedside chair)?: Total Help needed to walk in hospital room?: Total Help needed climbing 3-5 steps with a railing? : Total 6 Click Score: 6    End of Session   Activity Tolerance: Patient tolerated treatment well Patient left: in bed;with call bell/phone within reach;with family/visitor present Nurse Communication: Mobility status PT Visit Diagnosis: Other symptoms and signs involving the nervous system (R29.898);Pain Pain - Right/Left: Left Pain - part of body: Knee    Time: 1340-1440 PT Time Calculation (min) (ACUTE ONLY): 60 min   Charges:   PT Evaluation $PT Eval Low Complexity: 1 Low PT Treatments $Therapeutic Activity: 23-37  mins $Self Care/Home Management: 8-22 PT General Charges $$ ACUTE PT VISIT: 1 Visit         Blanchard Kelch PT Acute Rehabilitation Services Office [phone] Weekend [phone]   Rada Hay 01/08/2023, 3:11 PM

## 2023-01-08 NOTE — Progress Notes (Signed)
   01/08/23 2159  Provider Notification  Provider Name/Title Forestine Chute  Date Provider Notified 01/08/23  Time Provider Notified 2200  Method of Notification Page (secure chat)  Notification Reason Critical Result  Test performed and critical result lactic acid- 2.2  Date Critical Result Received 01/08/23  Time Critical Result Received 2159  Provider response No new orders  Date of Provider Response 01/08/23  Time of Provider Response 2200

## 2023-01-08 NOTE — Evaluation (Signed)
Occupational Therapy Evaluation Patient Details Name: [name] MRN: [id number] DOB: [birth year] Today's Date: 01/08/2023   History of Present Illness This is a 77 year old female with past medical history of HTN, HLD, postpolio syndrome wheelchair-bound, hypothyroidism, chronic back pain, GERD, HFpEF, atrial fibrillation, OSA, T2DM, h/o Takotsubo syndrome, L patella ORIF 2015.  She presents 01/07/23 with complaints of suprapubic, lower quadrant abdominal pain, cramping that radiated to her back,  endorses burning urination and a foul odor.  .  Patient is wheelchair-bound secondary to her postpolio syndrome.  She uses a   purewick.  Patient has had recurrent UTIs since June, this is her third episode.Found to have Acute pyelonephritis, left kidney   Clinical Impression   Patient is a 77 year old female who was admitted for above. Patient was living at home with caregiver support for ADLs.patient appears to be near baseline for ADLs at this time. Patient requesting HEP for BUE to maintain ROM and strength. OT to continue to follow to develop HEP for patient to continue.        If plan is discharge home, recommend the following: A lot of help with bathing/dressing/bathroom;Assistance with cooking/housework;Direct supervision/assist for medications management;Assist for transportation;Help with stairs or ramp for entrance;Direct supervision/assist for financial management;Two people to help with walking and/or transfers    Functional Status Assessment  Patient has had a recent decline in their functional status and demonstrates the ability to make significant improvements in function in a reasonable and predictable amount of time.  Equipment Recommendations  None recommended by OT       Precautions / Restrictions Precautions Precaution Comments: LE paralysis, L knee painful, says it was not bending, now the knee bends, H/O L patella  fracture 2015 Restrictions Weight Bearing Restrictions:  Yes RLE Weight Bearing: Non weight bearing LLE Weight Bearing: Non weight bearing             ADL either performed or assessed with clinical judgement   ADL             General ADL Comments: patient is able to complete self feeding tasks. patient and family indicated that they are looking for BUE HEP for patient to maintain ROM. patient reproted that her bottom hurts when in wheelchair. patient and family were educated on cushions for wheelchair use and pressure relief strategies. patients daughter to look into getting new wheelchair cushion. patient and family were educated on HEP for BUE with ROM of RUE being important. patient and family verbalized understanding. patient's daughter was able to demonstrate ability to [facility] patients shoulder within comfort limits today. OT to follow for one more session for HEP completion and then d/c.     Vision   Vision Assessment?: No apparent visual deficits            Pertinent Vitals/Pain Pain Assessment Pain Assessment: Faces Faces Pain Scale: Hurts little more Pain Location: bottom Pain Descriptors / Indicators: Discomfort Pain Intervention(s): Limited activity within patient's tolerance, Monitored during session, Repositioned     Extremity/Trunk Assessment Upper Extremity Assessment Upper Extremity Assessment: RUE deficits/detail RUE Deficits / Details: patient with about 10 degrees of FF and 20 degrees ABduction. patient was able to tolerate passive ROM to about 60 degrees ABduction and FF. patient is able to AROM ER about 30 degrees RUE: Unable to fully assess due to pain   Lower Extremity Assessment Lower Extremity Assessment: Defer to PT evaluation RLE Deficits / Details: noted  atrophy , minimal hip rotation, plantar flexed contracture  RLE Sensation: decreased proprioception LLE Deficits / Details: flaccid, plantar flexed contracture, can internall rotate,  knee flexion tolerated to ! 40 in sitting.   Cervical / Trunk  Assessment Cervical / Trunk Assessment: Kyphotic   Communication Communication Communication: No apparent difficulties   Cognition Arousal: Alert Behavior During Therapy: WFL for tasks assessed/performed Overall Cognitive Status: Within Functional Limits for tasks assessed             General Comments: daughters were present as well.                Home Living Family/patient expects to be discharged to:: Private residence Living Arrangements: Spouse/significant other;Children Available Help at Discharge: Family;Available 24 hours/day Type of Home: House Home Access: Ramped entrance     Home Layout: One level     Bathroom Shower/Tub: Tub only   Firefighter: Standard     Home Equipment: Wheelchair - Runner, broadcasting/film/video;Hospital bed   Additional Comments: lift chair, hoyer lift      Prior Functioning/Environment Prior Level of Function : Needs assist       Physical Assist : Mobility (physical);ADLs (physical) Mobility (physical): Bed mobility;Transfers   Mobility Comments: patien scoots forward onto bari BSC backwards (legs pass through the back bar) then scoots back into WC/scooter. Sleeps in recliner., spouse  palced a tub in garage and patient is lifted down into it with [employer] lift, has adaptive thruck that WC will  lift into          OT Problem List: Impaired UE functional use;Pain;Decreased activity tolerance      OT Treatment/Interventions: Therapeutic exercise;Self-care/ADL training;Patient/family education;Therapeutic activities    OT Goals(Current goals can be found in the care plan section) Acute Rehab OT Goals Patient Stated Goal: to go home OT Goal Formulation: With patient/family Time For Goal Achievement: 01/22/23 Potential to Achieve Goals: Fair  OT Frequency: Min 1X/week       AM-PAC OT "6 Clicks" Daily Activity     Outcome Measure Help from another person eating meals?: A Little Help from another person taking  care of personal grooming?: A Little Help from another person toileting, which includes using toliet, bedpan, or urinal?: Total Help from another person bathing (including washing, rinsing, drying)?: Total Help from another person to put on and taking off regular upper body clothing?: Total Help from another person to put on and taking off regular lower body clothing?: Total 6 Click Score: 10   End of Session    Activity Tolerance: Patient tolerated treatment well Patient left: in bed;with call bell/phone within reach;with family/visitor present  OT Visit Diagnosis: Pain Pain - Right/Left: Right Pain - part of body: Shoulder                Time: 4782-9562 OT Time Calculation (min): 32 min Charges:  OT General Charges $OT Visit: 1 Visit OT Evaluation $OT Eval Low Complexity: 1 Low OT Treatments $Therapeutic Activity: 8-22 mins  Rosalio Loud, MS Acute Rehabilitation Department Office# [phone]   Selinda Flavin 01/08/2023, 4:40 PM

## 2023-01-09 DIAGNOSIS — N10 Acute tubulo-interstitial nephritis: Secondary | ICD-10-CM | POA: Diagnosis not present

## 2023-01-09 LAB — CBC
HCT: 35.7 % — ABNORMAL LOW (ref 36.0–46.0)
Hemoglobin: 11.4 g/dL — ABNORMAL LOW (ref 12.0–15.0)
MCH: 30.2 pg (ref 26.0–34.0)
MCHC: 31.9 g/dL (ref 30.0–36.0)
MCV: 94.4 fL (ref 80.0–100.0)
Platelets: 352 10*3/uL (ref 150–400)
RBC: 3.78 MIL/uL — ABNORMAL LOW (ref 3.87–5.11)
RDW: 14.2 % (ref 11.5–15.5)
WBC: 12.2 10*3/uL — ABNORMAL HIGH (ref 4.0–10.5)
nRBC: 0 % (ref 0.0–0.2)

## 2023-01-09 LAB — BASIC METABOLIC PANEL
Anion gap: 7 (ref 5–15)
BUN: 8 mg/dL (ref 8–23)
CO2: 25 mmol/L (ref 22–32)
Calcium: 8.1 mg/dL — ABNORMAL LOW (ref 8.9–10.3)
Chloride: 102 mmol/L (ref 98–111)
Creatinine, Ser: 0.42 mg/dL — ABNORMAL LOW (ref 0.44–1.00)
GFR, Estimated: >60 mL/min (ref >60)
Glucose, Bld: 177 mg/dL — ABNORMAL HIGH (ref 70–99)
Potassium: 3.6 mmol/L (ref 3.5–5.1)
Sodium: 134 mmol/L — ABNORMAL LOW (ref 135–145)

## 2023-01-09 LAB — URINE CULTURE: Culture: >=100000 — AB

## 2023-01-09 LAB — LACTIC ACID, PLASMA: Lactic Acid, Venous: 1.6 mmol/L (ref 0.5–1.9)

## 2023-01-09 NOTE — Progress Notes (Signed)
Occupational Therapy Treatment Patient Details Name: [name] MRN: [id number] DOB: July 09, 1945 Today's Date: 01/09/2023   History of present illness This is a 77 year old female with past medical history of HTN, HLD, postpolio syndrome wheelchair-bound, hypothyroidism, chronic back pain, GERD, HFpEF, atrial fibrillation, OSA, T2DM, h/o Takotsubo syndrome, L patella ORIF 2015.  She presents 01/07/23 with complaints of suprapubic, lower quadrant abdominal pain, cramping that radiated to her back,  endorses burning urination and a foul odor.  .  Patient is wheelchair-bound secondary to her postpolio syndrome.  She uses a   purewick.  Patient has had recurrent UTIs since June, this is her third episode.Found to have Acute pyelonephritis, left kidney   OT comments  Patient and family were able to demonstrate understanding on HEP. No further skilled OT needs identified.  OT to sign off at this time.       If plan is discharge home, recommend the following:  A lot of help with bathing/dressing/bathroom;Assistance with cooking/housework;Direct supervision/assist for medications management;Assist for transportation;Help with stairs or ramp for entrance;Direct supervision/assist for financial management;Two people to help with walking and/or transfers   Equipment Recommendations  None recommended by OT       Precautions / Restrictions Precautions Precaution Comments: LE paralysis, L knee painful, says it was not bending, now the knee bends, H/O L patella  fracture 2015 Restrictions Weight Bearing Restrictions: Yes RLE Weight Bearing: Non weight bearing LLE Weight Bearing: Non weight bearing              ADL either performed or assessed with clinical judgement   ADL                       General ADL Comments: patient and daughter were educated on HEP program with written copy provided to patient. patient was able to demonstrate understanding for wrist forearm and elbow movements  with 100% accuracy. patients daughter was able to carryover education from previous session on PROM for RUE and goal to maintain ROM. patients daughter and patient report that they did not need further instruction on HEP at this time.      Cognition   Behavior During Therapy: WFL for tasks assessed/performed           General Comments: daughter and husband were present during session.              General Comments patient was noted to have prafo boots in room prior to arrival in package. attempted to orient them in appropriate way with patients anatmoical positioing of RLE in external rotation limiting the ability to offload pressure. message was sent to PT and nurse made aware. PT to further address when seeing patient later on this date.    Pertinent Vitals/ Pain       Pain Assessment Pain Assessment: Faces Faces Pain Scale: Hurts little more Pain Location: BLE with adjustment of positioning in bed Pain Descriptors / Indicators: Discomfort Pain Intervention(s): Limited activity within patient's tolerance, Monitored during session, Repositioned         Frequency           Progress Toward Goals  OT Goals(current goals can now be found in the care plan section)  Progress towards OT goals: Goals met/education completed, patient discharged from OT     Plan         AM-PAC OT "6 Clicks" Daily Activity     Outcome Measure   Help from another person eating meals?: A Little  Help from another person taking care of personal grooming?: A Little Help from another person toileting, which includes using toliet, bedpan, or urinal?: Total Help from another person bathing (including washing, rinsing, drying)?: Total Help from another person to put on and taking off regular upper body clothing?: Total Help from another person to put on and taking off regular lower body clothing?: Total 6 Click Score: 10    End of Session    OT Visit Diagnosis: Pain   Activity Tolerance  Patient tolerated treatment well   Patient Left in bed;with call bell/phone within reach;with family/visitor present   Nurse Communication Other (comment) (ill fitting prafo boots)        Time: 1478-2956 OT Time Calculation (min): 19 min  Charges: OT General Charges $OT Visit: 1 Visit OT Treatments $Therapeutic Activity: 8-22 mins  Rosalio Loud, MS Acute Rehabilitation Department Office# [phone]   Selinda Flavin 01/09/2023, 10:45 AM

## 2023-01-09 NOTE — Progress Notes (Addendum)
PROGRESS NOTE    [name]  ZOX:[id number] DOB: 07/25/1945 DOA: 01/07/2023 PCP: Charlane Ferretti, DO   Brief Narrative: 26 past medical history significant for hypertension, hyperlipidemia, postpolio syndrome wheelchair-bound, hypothyroidism, chronic back pain, GERD, heart failure preserved ejection fraction, A-fib, OSA, diabetes type 2, history of Takotsubo syndrome presenting with suprapubic lower quadrant abdominal pain radiating to her back.  She also endorses dysuria.  Last treated for UTI in June.  Patient admitted with pyelonephritis, CT study protocol suggestive of pyelo-.  Also found to have lactic acidosis.   Assessment & Plan:   Principal Problem:   Acute pyelonephritis Active Problems:   Essential hypertension   Hyperlipidemia   Hypothyroidism   Post-polio syndrome   ATRIAL FIBRILLATION   T2DM (type 2 diabetes mellitus) (HCC)  1-Acute Pyelonephritis of the left kidney: Presenting with back pain, suprapubic pain, dysuria.  UA with more than 50 white blood cell.  Cytosis Urine culture; Growing Klebsiella.  Continue with IV ceftriaxone White blood cell trending down today at 12.   2-Lactic acidosis: In the setting of infection.  Lactic acid down to 1.6 Received IV fluids and IV bolus.   3-Hypertension: Continue with carvedilol, Avapro.  Will hold torsemide in the setting of infection  4-History of A-fib: Continue with carvedilol  Dyspnea: In the setting of pain D-dimer, chest x-ray, BNP normal  Hyperlipidemia: Continue with atorvastatin  Hypothyroidism: Continue with Synthroid  Post polio syndrome: Continue with supportive care: Will get PT OT, might benefit from home health aide  Right sacrum,  left pretibial area partial-thickness 1: Present on admission, due to contact dermatitis, recently healed with stage III pressure injury noted, no drainage maceration due to incontinence.  Left lower extremity 4 x 2.5 x 0.2 cm wound: Present on admission: -Continue  with local care  Mild hyponatremia; monitor.  Left Knee osteoarthritis.  Follow up with Dr Ranell Patrick out patient.    Estimated body mass index is 46.4 kg/m as calculated from the following:   Height as of this encounter: 5' (1.524 m).   Weight as of this encounter: 107.8 kg.   DVT prophylaxis: Lovenox Code Status: Full code Family Communication: daughter  at bedside.  Disposition Plan:  Status is: Inpatient Remains inpatient appropriate because: management of Pyelonephritis. Plan to keep one more day on IV antibiotics, discharge tomorrow if stable.     Consultants:  None  Procedures:  none  Antimicrobials:    Subjective: She is complaining of left knee pain, she is able to bend her knee more than usual. She is complaining of knee pain. We discussed X ray was negative for fracture, showed severe arthritis.  She is bed-bound.   Objective: Vitals:   01/08/23 0605 01/08/23 1451 01/08/23 2008 01/09/23 0609  BP: (!) 153/58 (!) 148/46 (!) 127/58 (!) 176/62  Pulse: (!) 55 63 69 70  Resp: 17 16 18 18   Temp: 98.8 F (37.1 C) 98 F (36.7 C) 98.6 F (37 C) 98.4 F (36.9 C)  TempSrc: Oral Oral Oral Oral  SpO2: 99% 100% 95% 99%  Weight:      Height:        Intake/Output Summary (Last 24 hours) at 01/09/2023 1302 Last data filed at 01/09/2023 1000 Gross per 24 hour  Intake 2014.88 ml  Output 4350 ml  Net -2335.12 ml   Filed Weights   01/07/23 1600  Weight: 107.8 kg    Examination:  General exam: NAD Respiratory system: CTA Cardiovascular system: S 1, S 2  RRR Gastrointestinal  system: BS present, soft Central nervous system: alert,     Data Reviewed: I have personally reviewed following labs and imaging studies  CBC: Recent Labs  Lab 01/07/23 1750 01/08/23 0507 01/09/23 0439  WBC 17.8* 16.2* 12.2*  NEUTROABS 11.0* 10.1*  --   HGB 12.8 12.5 11.4*  HCT 39.5 39.4 35.7*  MCV 94.3 94.5 94.4  PLT 408* 380 352   Basic Metabolic Panel: Recent Labs  Lab  01/07/23 1750 01/08/23 0507 01/09/23 0439  NA 133* 134* 134*  K 4.1 3.5 3.6  CL 97* 100 102  CO2 25 26 25   GLUCOSE 145* 167* 177*  BUN 10 9 8   CREATININE 0.40* 0.46 0.42*  CALCIUM 8.9 8.7* 8.1*   GFR: Estimated Creatinine Clearance: 63.4 mL/min (A) (by C-G formula based on SCr of 0.42 mg/dL (L)). Liver Function Tests: Recent Labs  Lab 01/07/23 1750  AST 26  ALT 23  ALKPHOS 60  BILITOT 1.0  PROT 7.0  ALBUMIN 3.4*   Recent Labs  Lab 01/07/23 1750  LIPASE 20   No results for input(s): "AMMONIA" in the last 168 hours. Coagulation Profile: No results for input(s): "INR", "PROTIME" in the last 168 hours. Cardiac Enzymes: No results for input(s): "CKTOTAL", "CKMB", "CKMBINDEX", "TROPONINI" in the last 168 hours. BNP (last 3 results) No results for input(s): "PROBNP" in the last 8760 hours. HbA1C: No results for input(s): "HGBA1C" in the last 72 hours. CBG: No results for input(s): "GLUCAP" in the last 168 hours. Lipid Profile: No results for input(s): "CHOL", "HDL", "LDLCALC", "TRIG", "CHOLHDL", "LDLDIRECT" in the last 72 hours. Thyroid Function Tests: No results for input(s): "TSH", "T4TOTAL", "FREET4", "T3FREE", "THYROIDAB" in the last 72 hours. Anemia Panel: No results for input(s): "VITAMINB12", "FOLATE", "FERRITIN", "TIBC", "IRON", "RETICCTPCT" in the last 72 hours. Sepsis Labs: Recent Labs  Lab 01/08/23 1115 01/08/23 1332 01/08/23 2100 01/09/23 1202  LATICACIDVEN 1.9 3.0* 2.2* 1.6    Recent Results (from the past 240 hour(s))  Blood culture (routine x 2)     Status: None (Preliminary result)   Collection Time: 01/07/23  4:49 PM   Specimen: BLOOD RIGHT FOREARM  Result Value Ref Range Status   Specimen Description   Final    BLOOD RIGHT FOREARM Performed at [facility], [address], [place], [place] [address]    Special Requests   Final    BOTTLES DRAWN AEROBIC ONLY Blood Culture adequate volume Performed at [facility], [address], [place], [place] [address]    Culture   Final    NO GROWTH < 12 HOURS Performed at [facility], [address] [place], [place] [address]    Report Status PENDING  Incomplete  Urine Culture     Status: Abnormal   Collection Time: 01/07/23  5:18 PM   Specimen: Urine, Random  Result Value Ref Range Status   Specimen Description   Final    URINE, RANDOM Performed at [facility], [address] [place], [place] [address]    Special Requests   Final    NONE Reflexed from [id number] Performed at [facility], [address], [place], [place] [address]    Culture >=100,000 COLONIES/mL KLEBSIELLA PNEUMONIAE (A)  Final   Report Status 01/09/2023 FINAL  Final   Organism ID, Bacteria KLEBSIELLA PNEUMONIAE (A)  Final      Susceptibility   Klebsiella pneumoniae - MIC*    AMPICILLIN RESISTANT Resistant     CEFAZOLIN <=4  SENSITIVE Sensitive     CEFEPIME <=0.12 SENSITIVE Sensitive     CEFTRIAXONE <=0.25 SENSITIVE Sensitive     CIPROFLOXACIN <=0.25 SENSITIVE Sensitive     GENTAMICIN <=1 SENSITIVE Sensitive     IMIPENEM <=0.25 SENSITIVE Sensitive     NITROFURANTOIN 64 INTERMEDIATE Intermediate     TRIMETH/SULFA <=20 SENSITIVE Sensitive     AMPICILLIN/SULBACTAM 4 SENSITIVE Sensitive     PIP/TAZO <=4 SENSITIVE Sensitive     * >=100,000 COLONIES/mL KLEBSIELLA PNEUMONIAE  Blood culture (routine x 2)     Status: None (Preliminary result)   Collection Time: 01/07/23  5:40 PM   Specimen: BLOOD  Result Value Ref Range Status   Specimen Description   Final    BLOOD LEFT ANTECUBITAL Performed at [facility], [address] [place], [place] [address]    Special Requests   Final    BOTTLES DRAWN AEROBIC AND ANAEROBIC Blood Culture adequate volume Performed at [facility], [address] [place], [place] [address]    Culture   Final    NO GROWTH < 12 HOURS Performed at [facility], [address], [place], [place] [address]    Report Status PENDING  Incomplete         Radiology Studies: DG Knee 1-2 Views Left  Result Date: 01/08/2023 CLINICAL DATA:  Left knee pain. EXAM: LEFT KNEE - 1-2 VIEW COMPARISON:  CT left tibia and fibula 10/31/2022 FINDINGS: There is diffuse decreased bone mineralization. Postsurgical changes are seen of screw and cerclage wiring RF of the anterolateral patella. There is again severe medial greater than lateral joint space narrowing, subchondral sclerosis, and peripheral osteophytosis. There is again anterior translation of the tibia with respect to the distal femur. Severe patellofemoral joint space narrowing with large superior osteophytosis. Large posterior femoral condyle degenerative osteophytes. No acute fracture or dislocation. Moderate atherosclerotic calcifications. IMPRESSION: Severe tricompartmental osteoarthritis of the left knee. Electronically Signed   By: Neita Garnet M.D.   On: 01/08/2023 17:48   DG CHEST PORT 1 VIEW  Result Date: 01/07/2023 CLINICAL DATA:  Shortness of breath and chest pain. EXAM: PORTABLE CHEST 1 VIEW COMPARISON:  Chest radiograph dated 11/11/2022. FINDINGS: Shallow inspiration. No focal consolidation, pleural effusion, or pneumothorax. Stable cardiac silhouette. Atherosclerotic calcification of the aorta. No acute osseous pathology. Degenerative changes of the spine and left shoulder IMPRESSION: No active disease. Electronically Signed   By: Elgie Collard M.D.   On: 01/07/2023 22:13   CT Renal Stone Study  Result Date: 01/07/2023 CLINICAL DATA:  Cramping abdominal pain radiating to the back with foul-smelling urine. Blood in urine. EXAM: CT ABDOMEN AND PELVIS WITHOUT CONTRAST TECHNIQUE: Multidetector CT imaging of the abdomen and pelvis was performed following the standard protocol without IV contrast. RADIATION DOSE REDUCTION: This exam was performed according to the departmental dose-optimization program  which includes automated exposure control, adjustment of the mA and/or kV according to patient size and/or use of iterative reconstruction technique. COMPARISON:  CT 11/11/2022 and older FINDINGS: Lower chest: Slight linear opacity lung bases likely scar or atelectasis. No pleural effusion Hepatobiliary: On this non IV contrast exam, grossly preserved hepatic parenchyma only slight nodular contour previous cholecystectomy Pancreas: Unremarkable. No pancreatic ductal dilatation or surrounding inflammatory changes. Spleen: Normal in size without focal abnormality. Adrenals/Urinary Tract: Adrenal glands are preserved. Mild renal atrophy. The right kidney does not have a renal or ureteral stone. No collecting system dilatation. The right ureter has a normal course and caliber down  to the bladder. The bladder wall is thickened and there is some stranding. Please correlate for cystitis. In addition there is mild ectasia of the left renal collecting system without renal or ureteral stone and adjacent stranding. There is also some asymmetric left perinephric stranding compared to right. This has has a differential. Possibilities would include infection including pyelonephritis. Please correlate for a recently passed stone or other urothelial process as well. Please correlate with clinical presentation. Stomach/Bowel: Underdistended stomach with slight fold thickening. The small and large bowel are nondilated. Scattered stool. Appendix not well seen in the right lower quadrant but no pericecal stranding or fluid. Vascular/Lymphatic: Aortic atherosclerosis. No enlarged abdominal or pelvic lymph nodes. Reproductive: Status post hysterectomy. No adnexal masses. Other: No free air or free fluid.  Mild anasarca. Musculoskeletal: Curvature of the spine. Degenerative changes of the spine and pelvis. Osteopenia. Diffuse fatty muscle atrophy. IMPRESSION: Development wall thickening of the bladder as well as thickening along the  course of the urothelium of the left kidney with diffuse adjacent stranding. Slight ectasia of the left renal collecting system without renal stone. Possibilities would include cystitis with a potential pyelonephritis. Please correlate with clinical presentation. If there is concern of the sequela of pyelonephritis, a contrast study may be of some benefit as clinically appropriate otherwise would recommend follow up imaging to exclude other pathology as the etiology of the findings Electronically Signed   By: Karen Kays M.D.   On: 01/07/2023 19:58        Scheduled Meds:  amitriptyline  25 mg Oral QHS   aspirin EC  81 mg Oral QHS   atorvastatin  40 mg Oral Daily   carvedilol  25 mg Oral BID WC   clonazePAM  0.5 mg Oral BID   enoxaparin (LOVENOX) injection  40 mg Subcutaneous Q24H   ferrous sulfate  325 mg Oral Q breakfast   gabapentin  300 mg Oral QHS   hydrOXYzine  25 mg Oral QID   irbesartan  150 mg Oral Daily   levothyroxine  100 mcg Oral Q0600   liver oil-zinc oxide   Topical TID   mirabegron ER  50 mg Oral Daily   saccharomyces boulardii  250 mg Oral Daily   Continuous Infusions:  sodium chloride 100 mL/hr at 01/09/23 0841   cefTRIAXone (ROCEPHIN)  IV 1 g (01/09/23 0841)     LOS: 2 days    Time spent: 35 minutes    Abagayle Klutts A Amaree Leeper, MD [facility]   If 7PM-7AM, please contact night-coverage [email or web address]  01/09/2023, 1:02 PM

## 2023-01-10 DIAGNOSIS — N10 Acute tubulo-interstitial nephritis: Secondary | ICD-10-CM | POA: Diagnosis not present

## 2023-01-10 LAB — CBC
HCT: 34.3 % — ABNORMAL LOW (ref 36.0–46.0)
Hemoglobin: 11.2 g/dL — ABNORMAL LOW (ref 12.0–15.0)
MCH: 30.6 pg (ref 26.0–34.0)
MCHC: 32.7 g/dL (ref 30.0–36.0)
MCV: 93.7 fL (ref 80.0–100.0)
Platelets: 389 10*3/uL (ref 150–400)
RBC: 3.66 MIL/uL — ABNORMAL LOW (ref 3.87–5.11)
RDW: 14 % (ref 11.5–15.5)
WBC: 12.4 10*3/uL — ABNORMAL HIGH (ref 4.0–10.5)
nRBC: 0 % (ref 0.0–0.2)

## 2023-01-10 LAB — BASIC METABOLIC PANEL
Anion gap: 10 (ref 5–15)
Anion gap: 10 (ref 5–15)
BUN: 7 mg/dL — ABNORMAL LOW (ref 8–23)
BUN: 6 mg/dL — ABNORMAL LOW (ref 8–23)
CO2: 24 mmol/L (ref 22–32)
CO2: 24 mmol/L (ref 22–32)
Calcium: 8.4 mg/dL — ABNORMAL LOW (ref 8.9–10.3)
Calcium: 8.2 mg/dL — ABNORMAL LOW (ref 8.9–10.3)
Chloride: 103 mmol/L (ref 98–111)
Chloride: 100 mmol/L (ref 98–111)
Creatinine, Ser: <0.3 mg/dL — ABNORMAL LOW (ref 0.44–1.00)
Creatinine, Ser: 0.36 mg/dL — ABNORMAL LOW (ref 0.44–1.00)
GFR, Estimated: >60 mL/min (ref >60)
Glucose, Bld: 163 mg/dL — ABNORMAL HIGH (ref 70–99)
Glucose, Bld: 213 mg/dL — ABNORMAL HIGH (ref 70–99)
Potassium: 3.6 mmol/L (ref 3.5–5.1)
Potassium: 3.3 mmol/L — ABNORMAL LOW (ref 3.5–5.1)
Sodium: 137 mmol/L (ref 135–145)
Sodium: 134 mmol/L — ABNORMAL LOW (ref 135–145)

## 2023-01-10 LAB — PHOSPHORUS: Phosphorus: 2.9 mg/dL (ref 2.5–4.6)

## 2023-01-10 LAB — TROPONIN I (HIGH SENSITIVITY)
Troponin I (High Sensitivity): 7 ng/L (ref <18)
Troponin I (High Sensitivity): 6 ng/L (ref <18)

## 2023-01-10 LAB — MAGNESIUM: Magnesium: 1.3 mg/dL — ABNORMAL LOW (ref 1.7–2.4)

## 2023-01-10 MED ORDER — NITROGLYCERIN 0.4 MG SL SUBL
0.4000 mg | SUBLINGUAL_TABLET | SUBLINGUAL | Status: DC | PRN
  Administered 2023-01-10 (×3): 0.4 mg via SUBLINGUAL
  Filled 2023-01-10: qty 1

## 2023-01-10 MED ORDER — HYDROMORPHONE HCL 1 MG/ML IJ SOLN
1.0000 mg | INTRAMUSCULAR | Status: DC
  Filled 2023-01-10: qty 1

## 2023-01-10 MED ORDER — MORPHINE SULFATE (PF) 2 MG/ML IV SOLN
2.0000 mg | INTRAVENOUS | Status: DC | PRN
  Administered 2023-01-10: 2 mg via INTRAVENOUS
  Filled 2023-01-10: qty 1

## 2023-01-10 MED ORDER — FAMOTIDINE IN NACL 20-0.9 MG/50ML-% IV SOLN
20.0000 mg | Freq: Once | INTRAVENOUS | Status: AC
  Administered 2023-01-10: 20 mg via INTRAVENOUS
  Filled 2023-01-10: qty 50

## 2023-01-10 MED ORDER — BISACODYL 10 MG RE SUPP
10.0000 mg | Freq: Once | RECTAL | Status: AC
  Administered 2023-01-10: 10 mg via RECTAL
  Filled 2023-01-10: qty 1

## 2023-01-10 MED ORDER — SIMETHICONE 80 MG PO CHEW: 80.0000 mg | CHEWABLE_TABLET | Freq: Four times a day (QID) | ORAL | Status: DC | PRN

## 2023-01-10 MED ORDER — ALUM & MAG HYDROXIDE-SIMETH 200-200-20 MG/5ML PO SUSP
30.0000 mL | ORAL | Status: DC | PRN
  Administered 2023-01-10: 30 mL via ORAL
  Filled 2023-01-10: qty 30

## 2023-01-10 NOTE — Progress Notes (Signed)
NP [name] was notified that patient is c/o chest pain 8/10 that feels like pressure and radiates to her left arm.

## 2023-01-10 NOTE — Progress Notes (Signed)
PROGRESS NOTE    [name]  YTK:[id number] DOB: 21-May-1945 DOA: 01/07/2023 PCP: Charlane Ferretti, DO   Brief Narrative: [name] is a 77 y.o. female with a history of hypertension, hyperlipidemia, postpolio syndrome currently wheelchair-bound, hypothyroidism, chronic back pain, GERD, heart failure with preserved ejection fraction, atrial fibrillation, OSA, diabetes mellitus type 2.  Patient presented secondary to suprapubic left lower quadrant abdominal pain with radiation to her back and was found to have evidence of acute pyelonephritis involving the left kidney.  Patient started on empiric treatment with ceftriaxone.  Urine culture significant for Klebsiella pneumoniae.   Assessment and Plan:  Acute pyelonephritis Perinephric stranding involving the left kidney.  Urine culture significant for Klebsiella pneumoniae infection.  Patient was empirically started on ceftriaxone for management. -Continue ceftriaxone  Lactic acidosis Resolved with IV fluids.  Primary hypertension -Continue with home Coreg -Continue irbesartan (substituted for home telmisartan)  Atrial fibrillation -Continue Coreg  Diabetes mellitus, type 2 Well-controlled.  Last hemoglobin A1c of 5.7% from June 2024.  Patient is not on medication management at this time.  Dyspnea Presumed secondary to pain. D-dimer negative, chest x-ray without infection, BNP normal. Overall, symptoms improved.  Hyponatremia Mild  Constipation -Suppository  Morbid obesity Estimated body mass index is 46.4 kg/m as calculated from the following:   Height as of this encounter: 5' (1.524 m).   Weight as of this encounter: 107.8 kg.   DVT prophylaxis: Lovenox Code Status:   Code Status: Full Code Family Communication: Husband at bedside Disposition Plan: Discharge home likely in 24 hours   Consultants:  None  Procedures:  None  Antimicrobials: None    Subjective: Patient reports persistent abdominal pain.  Also with some left arm pain.  Objective: BP (!) 156/67 (BP Location: Left Arm)   Pulse 77   Temp 98.9 F (37.2 C) (Oral)   Resp 16   Ht 5' (1.524 m)   Wt 107.8 kg   SpO2 100%   BMI 46.40 kg/m   Examination:  General exam: Appears calm and comfortable Respiratory system: Clear to auscultation. Respiratory effort normal. Cardiovascular system: S1 & S2 heard, RRR. Gastrointestinal system: Abdomen is nondistended, soft and nontender. Normal bowel sounds heard. Central nervous system: Alert and oriented. No focal neurological deficits. Musculoskeletal: No edema. No calf tenderness Skin: No cyanosis. No rashes Psychiatry: Judgement and insight appear normal. Mood & affect appropriate.    Data Reviewed: I have personally reviewed following labs and imaging studies  CBC Lab Results  Component Value Date   WBC 12.4 (H) 01/10/2023   RBC 3.66 (L) 01/10/2023   HGB 11.2 (L) 01/10/2023   HCT 34.3 (L) 01/10/2023   MCV 93.7 01/10/2023   MCH 30.6 01/10/2023   PLT 389 01/10/2023   MCHC 32.7 01/10/2023   RDW 14.0 01/10/2023   LYMPHSABS 3.5 01/08/2023   MONOABS 1.4 (H) 01/08/2023   EOSABS 1.1 (H) 01/08/2023   BASOSABS 0.1 01/08/2023     Last metabolic panel Lab Results  Component Value Date   NA 137 01/10/2023   K 3.6 01/10/2023   CL 103 01/10/2023   CO2 24 01/10/2023   BUN 7 (L) 01/10/2023   CREATININE <0.30 (L) 01/10/2023   GLUCOSE 163 (H) 01/10/2023   GFRNONAA NOT CALCULATED 01/10/2023   GFRAA 100 09/05/2019   CALCIUM 8.4 (L) 01/10/2023   PHOS 2.9 10/27/2022   PROT 7.0 01/07/2023   ALBUMIN 3.4 (L) 01/07/2023   BILITOT 1.0 01/07/2023   ALKPHOS 60 01/07/2023   AST  26 01/07/2023   ALT 23 01/07/2023   ANIONGAP 10 01/10/2023    GFR: CrCl cannot be calculated (This lab value cannot be used to calculate CrCl because it is not a number: <0.30).  Recent Results (from the past 240 hour(s))  Blood culture (routine x 2)     Status: None (Preliminary result)   Collection  Time: 01/07/23  4:49 PM   Specimen: BLOOD RIGHT FOREARM  Result Value Ref Range Status   Specimen Description   Final    BLOOD RIGHT FOREARM Performed at [facility], [address] [place], [place] [address]    Special Requests   Final    BOTTLES DRAWN AEROBIC ONLY Blood Culture adequate volume Performed at [facility], [address] [place], [place] [address]    Culture   Final    NO GROWTH 2 DAYS Performed at [facility], [address], [place], [place] [address]    Report Status PENDING  Incomplete  Urine Culture     Status: Abnormal   Collection Time: 01/07/23  5:18 PM   Specimen: Urine, Random  Result Value Ref Range Status   Specimen Description   Final    URINE, RANDOM Performed at [facility], [address] [place], [place] [address]    Special Requests   Final    NONE Reflexed from [id number] Performed at [facility], [address] [place], [place] [address]    Culture >=100,000 COLONIES/mL KLEBSIELLA PNEUMONIAE (A)  Final   Report Status 01/09/2023 FINAL  Final   Organism ID, Bacteria KLEBSIELLA PNEUMONIAE (A)  Final      Susceptibility   Klebsiella pneumoniae - MIC*    AMPICILLIN RESISTANT Resistant     CEFAZOLIN <=4 SENSITIVE Sensitive     CEFEPIME <=0.12 SENSITIVE Sensitive     CEFTRIAXONE <=0.25 SENSITIVE Sensitive     CIPROFLOXACIN <=0.25 SENSITIVE Sensitive     GENTAMICIN <=1 SENSITIVE Sensitive     IMIPENEM <=0.25 SENSITIVE Sensitive     NITROFURANTOIN 64 INTERMEDIATE Intermediate     TRIMETH/SULFA <=20 SENSITIVE Sensitive     AMPICILLIN/SULBACTAM 4 SENSITIVE Sensitive     PIP/TAZO <=4 SENSITIVE Sensitive     * >=100,000 COLONIES/mL KLEBSIELLA PNEUMONIAE  Blood culture (routine x 2)     Status: None (Preliminary result)   Collection Time: 01/07/23  5:40 PM   Specimen: BLOOD  Result Value Ref Range Status   Specimen Description   Final    BLOOD LEFT  ANTECUBITAL Performed at [facility], [address], [place], [place] [address]    Special Requests   Final    BOTTLES DRAWN AEROBIC AND ANAEROBIC Blood Culture adequate volume Performed at [facility], [address] [place], [place] [address]    Culture   Final    NO GROWTH 2 DAYS Performed at [facility], [address], [place], [place] [address]    Report Status PENDING  Incomplete      Radiology Studies: DG Knee 1-2 Views Left  Result Date: 01/08/2023 CLINICAL DATA:  Left knee pain. EXAM: LEFT KNEE - 1-2 VIEW COMPARISON:  CT left tibia and fibula 10/31/2022 FINDINGS: There is diffuse decreased bone mineralization. Postsurgical changes are seen of screw and cerclage wiring RF of the anterolateral patella. There is again severe medial greater than lateral joint space narrowing, subchondral sclerosis, and peripheral osteophytosis. There is again anterior translation of the tibia with respect to  the distal femur. Severe patellofemoral joint space narrowing with large superior osteophytosis. Large posterior femoral condyle degenerative osteophytes. No acute fracture or dislocation. Moderate atherosclerotic calcifications. IMPRESSION: Severe tricompartmental osteoarthritis of the left knee. Electronically Signed   By: Neita Garnet M.D.   On: 01/08/2023 17:48      LOS: 3 days    Jacquelin Hawking, MD [facility] 01/10/2023, 2:06 PM   If 7PM-7AM, please contact night-coverage [email or web address]

## 2023-01-10 NOTE — Progress Notes (Signed)
    Patient Name: [name]           DOB: 12/18/1945  MRN: [id number]      Admission Date: 01/07/2023  Attending Provider: Narda Bonds, MD  Primary Diagnosis: Acute pyelonephritis   Level of care: Med-Surg    CROSS COVER NOTE   Date of Service   01/10/2023   [name], 76 y.o. female, was admitted on 01/07/2023 for Acute pyelonephritis.    HPI/Events of Note   Chest Pain, epigastric pain PMH: angina pectoris 8/10 retrosternal chest pain radiating to left arm and epigastric region. Describes as "pressure- like and burning."  Pain was not improved with sublingual nitroglycerin.  Denies dyspnea, fatigue, palpitations, dizziness, nausea, vomiting, or cough.  EKG reviewed, normal sinus rhythm with PAC and PVCs, no ST segment changes noted  At bedside, patient is alert to self and place.  She recently received IV Dilaudid and is noted to have some confusion since then.  She is resting calmly in bed and does not appear to be in acute distress.    Lung sounds are clear, S1 and S2 heard.  Patient states pain is reproducible when palpating sternum and also has complaints of acid reflux. Adding on simethicone, Maalox, and Pepcid given epigastric discomfort.  If no relief, try IV morphine.  Nursing staff to report if pain is not relieved.  Interventions/ Plan   EKG Troponin x2 - negative PRN Nitroglycerin IV Morphine  Supplemental Oxygen as needed. Consider neb for CP 2/2 demand Simethicone, maalox, pepcid  Cardaic telemetry         Anthoney Harada, DNP, [employer] [facility] [place]

## 2023-01-10 NOTE — Hospital Course (Addendum)
[name] is a 77 y.o. female with a history of hypertension, hyperlipidemia, postpolio syndrome currently wheelchair-bound, hypothyroidism, chronic back pain, GERD, heart failure with preserved ejection fraction, atrial fibrillation, OSA, diabetes mellitus type 2.  Patient presented secondary to suprapubic left lower quadrant abdominal pain with radiation to her back and was found to have evidence of acute pyelonephritis involving the left kidney.  Patient started on empiric treatment with ceftriaxone.  Urine culture significant for Klebsiella pneumoniae. Patient discharged on Cefadroxil to complete a 7-day course of antibiotics.

## 2023-01-11 ENCOUNTER — Inpatient Hospital Stay (HOSPITAL_COMMUNITY): Payer: Medicare Other

## 2023-01-11 DIAGNOSIS — N10 Acute tubulo-interstitial nephritis: Secondary | ICD-10-CM | POA: Diagnosis not present

## 2023-01-11 LAB — CBC
HCT: 33.6 % — ABNORMAL LOW (ref 36.0–46.0)
Hemoglobin: 11.1 g/dL — ABNORMAL LOW (ref 12.0–15.0)
MCH: 30.7 pg (ref 26.0–34.0)
MCHC: 33 g/dL (ref 30.0–36.0)
MCV: 92.8 fL (ref 80.0–100.0)
Platelets: 386 10*3/uL (ref 150–400)
RBC: 3.62 MIL/uL — ABNORMAL LOW (ref 3.87–5.11)
RDW: 13.9 % (ref 11.5–15.5)
WBC: 12.7 10*3/uL — ABNORMAL HIGH (ref 4.0–10.5)
nRBC: 0 % (ref 0.0–0.2)

## 2023-01-11 LAB — POTASSIUM: Potassium: 3.8 mmol/L (ref 3.5–5.1)

## 2023-01-11 MED ORDER — LIDOCAINE 5 % EX PTCH
1.0000 | MEDICATED_PATCH | CUTANEOUS | Status: DC
  Administered 2023-01-11: 1 via TRANSDERMAL
  Filled 2023-01-11: qty 1

## 2023-01-11 MED ORDER — MAGNESIUM SULFATE 4 GM/100ML IV SOLN
4.0000 g | Freq: Once | INTRAVENOUS | Status: AC
  Administered 2023-01-11: 4 g via INTRAVENOUS
  Filled 2023-01-11: qty 100

## 2023-01-11 MED ORDER — POTASSIUM CHLORIDE CRYS ER 20 MEQ PO TBCR
40.0000 meq | EXTENDED_RELEASE_TABLET | Freq: Once | ORAL | Status: AC
  Administered 2023-01-11: 40 meq via ORAL
  Filled 2023-01-11: qty 2

## 2023-01-11 MED ORDER — CEFADROXIL 500 MG PO CAPS: 500.0000 mg | ORAL_CAPSULE | Freq: Two times a day (BID) | ORAL | 0 refills | Status: DC

## 2023-01-11 NOTE — TOC Transition Note (Signed)
Transition of Care [facility]) - CM/SW Discharge Note   Patient Details  Name: [name] MRN: [id number] Date of Birth: 06/17/1945  [facility] CM/SW Contact:  Amada Jupiter, LCSW Phone Number: 01/11/2023, 12:58 PM   Clinical Narrative:    Met with pt and family yesterday and today to discuss DME needs.  They are aware that PT has recommended an air overlay mattress for pt's hospital bed already in the home.  Pt confirms bed was provided by [employer] - have placed referral for alternating pressure and pump overlay for delivery to pt's home.  Spouse confirms he has received a call from [employer] with plan to deliver by tomorrow.  Pt/ family to check quality of the wheelchair cushion she already has at home and, if any concerns or need for a new cushion, they understand they need to contact DME company who provided the power w/c.  Pt and family report pt at overall baseline functional level.  They have a wc van to provide dc transport home.  No further TOC needs.   Final next level of care: Home/Self Care Barriers to Discharge: Barriers Resolved   Patient Goals and CMS Choice      Discharge Placement                         Discharge Plan and Services Additional resources added to the After Visit Summary for                  DME Arranged: Air overlay mattress DME Agency: [employer] Date DME Agency Contacted: 01/11/23 Time DME Agency Contacted: 1000 Representative spoke with at DME Agency: Edwyna Ready            Social Determinants of Health (SDOH) Interventions SDOH Screenings   Food Insecurity: Patient Declined (01/08/2023)  Housing: Patient Declined (01/08/2023)  Transportation Needs: Patient Declined (01/08/2023)  Utilities: Patient Declined (01/08/2023)  Depression (PHQ2-9): Low Risk  (05/30/2021)  Tobacco Use: Low Risk  (01/08/2023)     Readmission Risk Interventions    01/11/2023   12:56 PM 10/26/2022   12:31 PM  Readmission Risk Prevention Plan   Transportation Screening Complete Complete  PCP or Specialist Appt within 5-7 Days  Complete  PCP or Specialist Appt within 3-5 Days Complete   Home Care Screening  Complete  Medication Review (RN CM)  Complete  HRI or Home Care Consult Complete   Social Work Consult for Recovery Care Planning/Counseling Complete   Palliative Care Screening Not Applicable   Medication Review Oceanographer) Complete

## 2023-01-11 NOTE — Discharge Instructions (Addendum)
[name],  You were in the hospital with pyelonephritis, a kidney infection. This has improved with antibiotics. Please continue antibiotics and follow-up with your PCP. Please continue to use lidocaine patches from over the counter to help with your shoulder pain. Keeping it supported can help with your pain as well.

## 2023-01-11 NOTE — Plan of Care (Signed)
  Problem: Education: Goal: Knowledge of General Education information will improve Description Including pain rating scale, medication(s)/side effects and non-pharmacologic comfort measures Outcome: Progressing   

## 2023-01-11 NOTE — Progress Notes (Signed)
Patient discharged home with husband, IV removed, discharge paperwork provided and explained, patient and patient's husband verbalized understanding.

## 2023-01-11 NOTE — Discharge Summary (Signed)
Physician Discharge Summary   Patient: [name] MRN: [id number] DOB: 1945-08-04  Admit date:     01/07/2023  Discharge date: 01/11/2023  Discharge Physician: Jacquelin Hawking, MD   PCP: Charlane Ferretti, DO   Recommendations at discharge:  PCP follow-up in one week  Discharge Diagnoses: Principal Problem:   Acute pyelonephritis Active Problems:   Essential hypertension   Hyperlipidemia   Hypothyroidism   Post-polio syndrome   ATRIAL FIBRILLATION   T2DM (type 2 diabetes mellitus) (HCC)  Resolved Problems:   * No resolved hospital problems. *  Hospital Course: [name] is a 77 y.o. female with a history of hypertension, hyperlipidemia, postpolio syndrome currently wheelchair-bound, hypothyroidism, chronic back pain, GERD, heart failure with preserved ejection fraction, atrial fibrillation, OSA, diabetes mellitus type 2.  Patient presented secondary to suprapubic left lower quadrant abdominal pain with radiation to her back and was found to have evidence of acute pyelonephritis involving the left kidney.  Patient started on empiric treatment with ceftriaxone.  Urine culture significant for Klebsiella pneumoniae.  Assessment and Plan:  Acute pyelonephritis Perinephric stranding involving the left kidney.  Urine culture significant for Klebsiella pneumoniae infection.  Patient was empirically started on ceftriaxone for management with improvement of symptoms. Patient transitioned to Cefadroxil to complete a 7 day course of antibiotics   Lactic acidosis Resolved with IV fluids.   Primary hypertension Continue with home Coreg and telmisartan.   Paroxysmal atrial fibrillation Continue Coreg   Diabetes mellitus, type 2 Well-controlled.  Last hemoglobin A1c of 5.7% from June 2024.  Patient is not on medication management at this time.   Dyspnea Presumed secondary to pain. D-dimer negative, chest x-ray without infection, BNP normal. Overall, symptoms improved.    Hyponatremia Mild   Constipation Suppository given.   Morbid obesity Estimated body mass index is 46.4 kg/m as calculated from the following:   Height as of this encounter: 5' (1.524 m).   Weight as of this encounter: 107.8 kg.   Consultants: None Procedures performed: None  Disposition: Home Diet recommendation: Carb modified diet   DISCHARGE MEDICATION: Allergies as of 01/11/2023       Reactions   Tamsulosin Other (See Comments)   Caused uncontrollable movements   Ciprofloxacin Rash   Carisoprodol Itching   Other Reaction(s): Unknown   Oxycodone-acetaminophen    Other Reaction(s): ITCHING   Penicillins Itching   Pt has tolerated rocephin IV Other Reaction(s): ITCHING   Sulfa Antibiotics Other (See Comments)   Unknown.  States as always told had an allergy Other Reaction(s): Unknown   Acetaminophen Itching   Other Reaction(s): ITCHING   Aspirin Itching, Other (See Comments)   Chest pain, also   Macrodantin [nitrofurantoin Macrocrystal] Rash   Nsaids Other (See Comments)   Chest pain and stomach pain Other Reaction(s): Unknown   Percocet [oxycodone-acetaminophen] Itching   Propoxyphene Other (See Comments)   Chest/stomach pain Other Reaction(s): ITCHING   Propoxyphene N-acetaminophen Other (See Comments)   Chest/stomach pain        Medication List     TAKE these medications    amitriptyline 25 MG tablet Commonly known as: ELAVIL Take 25 mg by mouth at bedtime.   ascorbic acid 500 MG tablet Commonly known as: VITAMIN C Take 500 mg by mouth daily.   aspirin 81 MG tablet Take 81 mg by mouth at bedtime.   atorvastatin 40 MG tablet Commonly known as: LIPITOR Take 40 mg by mouth daily.   b complex vitamins tablet Take 1 tablet by  mouth daily.   carvedilol 25 MG tablet Commonly known as: COREG Take 25 mg by mouth 2 (two) times daily with a meal.   cefadroxil 500 MG capsule Commonly known as: DURICEF Take 1 capsule (500 mg total) by mouth 2  (two) times daily for 2 days.   clonazePAM 0.5 MG tablet Commonly known as: KLONOPIN Take 0.5 mg by mouth at bedtime.   gabapentin 300 MG capsule Commonly known as: NEURONTIN Take 300 mg by mouth 2 (two) times daily.   hydrOXYzine 25 MG capsule Commonly known as: VISTARIL Take 25 mg by mouth at bedtime.   Iron (Ferrous Sulfate) 325 (65 Fe) MG Tabs Take 1 tablet by mouth daily.   levothyroxine 100 MCG tablet Commonly known as: SYNTHROID Take 100 mcg by mouth daily before breakfast.   loratadine 10 MG tablet Commonly known as: CLARITIN Take 10 mg by mouth daily as needed for allergies.   metFORMIN 500 MG tablet Commonly known as: GLUCOPHAGE Take 500 mg by mouth 2 (two) times daily.   mirabegron ER 50 MG Tb24 tablet Commonly known as: MYRBETRIQ Take 50 mg by mouth daily.   multivitamin with minerals tablet Take 1 tablet by mouth daily.   nitroGLYCERIN 0.4 MG SL tablet Commonly known as: NITROSTAT Place 1 tablet (0.4 mg total) under the tongue every 5 (five) minutes as needed for chest pain.   pantoprazole 40 MG tablet Commonly known as: PROTONIX Take 1 tablet by mouth daily. Take 1 tab daily   saccharomyces boulardii 250 MG capsule Commonly known as: FLORASTOR Take 250 mg by mouth daily.   telmisartan 80 MG tablet Commonly known as: MICARDIS Take 40 mg by mouth daily. Pt takes half of a tab daily   tiZANidine 4 MG tablet Commonly known as: ZANAFLEX Take 4 mg by mouth at bedtime. PAIN   torsemide 5 MG tablet Commonly known as: DEMADEX Take 5 mg by mouth daily.   vitamin E 180 MG (400 UNITS) capsule Take 400 Units by mouth daily.   Zinc 50 MG Tabs Take 50 mg by mouth daily.   zolpidem 5 MG tablet Commonly known as: AMBIEN Take 5 mg by mouth at bedtime as needed for sleep.        Follow-up Information     Charlane Ferretti, DO. Schedule an appointment as soon as possible for a visit in 1 week(s).   Specialty: Internal Medicine Why: For hospital  follow-up Contact information: [address] [place] [address] [phone]                Discharge Exam: BP (!) 162/74 (BP Location: Right Arm)   Pulse 63   Temp 99 F (37.2 C) (Oral)   Resp 17   Ht 5' (1.524 m)   Wt 107.8 kg   SpO2 100%   BMI 46.40 kg/m   General exam: Appears calm and comfortable. Central nervous system: Alert and oriented. Psychiatry: Judgement and insight appear normal. Mood & affect appropriate.   Condition at discharge: stable  The results of significant diagnostics from this hospitalization (including imaging, microbiology, ancillary and laboratory) are listed below for reference.   Imaging Studies: DG Shoulder Left Port  Result Date: 01/11/2023 CLINICAL DATA:  Left shoulder pain. EXAM: LEFT SHOULDER COMPARISON:  None Available. FINDINGS: There is no evidence of fracture or dislocation. Mild osteophyte formation is seen involving the left glenohumeral joint. Widening of glenohumeral joint is noted suggesting ligamentous instability. Soft tissues are unremarkable. IMPRESSION: Mild degenerative joint disease of left glenohumeral  joint is noted. Some widening of the joint space is noted as well suggesting ligamentous laxity. No fracture is noted. Electronically Signed   By: Lupita Raider M.D.   On: 01/11/2023 14:29   DG Knee 1-2 Views Left  Result Date: 01/08/2023 CLINICAL DATA:  Left knee pain. EXAM: LEFT KNEE - 1-2 VIEW COMPARISON:  CT left tibia and fibula 10/31/2022 FINDINGS: There is diffuse decreased bone mineralization. Postsurgical changes are seen of screw and cerclage wiring RF of the anterolateral patella. There is again severe medial greater than lateral joint space narrowing, subchondral sclerosis, and peripheral osteophytosis. There is again anterior translation of the tibia with respect to the distal femur. Severe patellofemoral joint space narrowing with large superior osteophytosis. Large posterior femoral condyle degenerative  osteophytes. No acute fracture or dislocation. Moderate atherosclerotic calcifications. IMPRESSION: Severe tricompartmental osteoarthritis of the left knee. Electronically Signed   By: Neita Garnet M.D.   On: 01/08/2023 17:48   DG CHEST PORT 1 VIEW  Result Date: 01/07/2023 CLINICAL DATA:  Shortness of breath and chest pain. EXAM: PORTABLE CHEST 1 VIEW COMPARISON:  Chest radiograph dated 11/11/2022. FINDINGS: Shallow inspiration. No focal consolidation, pleural effusion, or pneumothorax. Stable cardiac silhouette. Atherosclerotic calcification of the aorta. No acute osseous pathology. Degenerative changes of the spine and left shoulder IMPRESSION: No active disease. Electronically Signed   By: Elgie Collard M.D.   On: 01/07/2023 22:13   CT Renal Stone Study  Result Date: 01/07/2023 CLINICAL DATA:  Cramping abdominal pain radiating to the back with foul-smelling urine. Blood in urine. EXAM: CT ABDOMEN AND PELVIS WITHOUT CONTRAST TECHNIQUE: Multidetector CT imaging of the abdomen and pelvis was performed following the standard protocol without IV contrast. RADIATION DOSE REDUCTION: This exam was performed according to the departmental dose-optimization program which includes automated exposure control, adjustment of the mA and/or kV according to patient size and/or use of iterative reconstruction technique. COMPARISON:  CT 11/11/2022 and older FINDINGS: Lower chest: Slight linear opacity lung bases likely scar or atelectasis. No pleural effusion Hepatobiliary: On this non IV contrast exam, grossly preserved hepatic parenchyma only slight nodular contour previous cholecystectomy Pancreas: Unremarkable. No pancreatic ductal dilatation or surrounding inflammatory changes. Spleen: Normal in size without focal abnormality. Adrenals/Urinary Tract: Adrenal glands are preserved. Mild renal atrophy. The right kidney does not have a renal or ureteral stone. No collecting system dilatation. The right ureter has a  normal course and caliber down to the bladder. The bladder wall is thickened and there is some stranding. Please correlate for cystitis. In addition there is mild ectasia of the left renal collecting system without renal or ureteral stone and adjacent stranding. There is also some asymmetric left perinephric stranding compared to right. This has has a differential. Possibilities would include infection including pyelonephritis. Please correlate for a recently passed stone or other urothelial process as well. Please correlate with clinical presentation. Stomach/Bowel: Underdistended stomach with slight fold thickening. The small and large bowel are nondilated. Scattered stool. Appendix not well seen in the right lower quadrant but no pericecal stranding or fluid. Vascular/Lymphatic: Aortic atherosclerosis. No enlarged abdominal or pelvic lymph nodes. Reproductive: Status post hysterectomy. No adnexal masses. Other: No free air or free fluid.  Mild anasarca. Musculoskeletal: Curvature of the spine. Degenerative changes of the spine and pelvis. Osteopenia. Diffuse fatty muscle atrophy. IMPRESSION: Development wall thickening of the bladder as well as thickening along the course of the urothelium of the left kidney with diffuse adjacent stranding. Slight ectasia of the left renal  collecting system without renal stone. Possibilities would include cystitis with a potential pyelonephritis. Please correlate with clinical presentation. If there is concern of the sequela of pyelonephritis, a contrast study may be of some benefit as clinically appropriate otherwise would recommend follow up imaging to exclude other pathology as the etiology of the findings Electronically Signed   By: Karen Kays M.D.   On: 01/07/2023 19:58   IR INJECT DIAG/THERA/INC NEEDLE/CATH/PLC EPI/LUMB/SAC W/IMG  Result Date: 12/27/2022 CLINICAL DATA:  77 year old female with history of multilevel lumbar spondylosis and back pain status post prior  left L3-L4 epidural steroid injections on 07/19/2022 and 09/11/2022 with good results. FLUOROSCOPY: Radiation Exposure Index (as provided by the fluoroscopic device): 81 mGy Kerma PROCEDURE: The procedure, risks, benefits, and alternatives were explained to the patient. Questions regarding the procedure were encouraged and answered. The patient understands and consents to the procedure. LUMBAR EPIDURAL INJECTION: An interlaminar approach was performed on left at L3-L4. The overlying skin was cleansed and anesthetized. A 6 inch 20 gauge epidural needle was advanced using loss-of-resistance technique. DIAGNOSTIC EPIDURAL INJECTION: Injection of Isovue-M 200 shows a good epidural pattern with spread above and below the level of needle placement, primarily on the left no vascular opacification is seen. THERAPEUTIC EPIDURAL INJECTION: Eighty mg of Depo-Medrol mixed with 3 mL 1% lidocaine were instilled. The procedure was well-tolerated, and the patient was discharged thirty minutes following the injection in good condition. COMPLICATIONS: None immediate IMPRESSION: Technically successful epidural injection on the left at L3-L4. Marliss Coots, MD [facility] Electronically Signed   By: Marliss Coots M.D.   On: 12/27/2022 05:56    Microbiology: Results for orders placed or performed during the hospital encounter of 01/07/23  Blood culture (routine x 2)     Status: None (Preliminary result)   Collection Time: 01/07/23  4:49 PM   Specimen: BLOOD RIGHT FOREARM  Result Value Ref Range Status   Specimen Description   Final    BLOOD RIGHT FOREARM Performed at [facility], [address], [place], [place] [address]    Special Requests   Final    BOTTLES DRAWN AEROBIC ONLY Blood Culture adequate volume Performed at [facility], [address] [place], [place] [address]    Culture   Final    NO GROWTH 3  DAYS Performed at [facility], [address] [place], [place] [address]    Report Status PENDING  Incomplete  Urine Culture     Status: Abnormal   Collection Time: 01/07/23  5:18 PM   Specimen: Urine, Random  Result Value Ref Range Status   Specimen Description   Final    URINE, RANDOM Performed at [facility], [address], [place], [place] [address]    Special Requests   Final    NONE Reflexed from [id number] Performed at [facility], [address], [place], [place] [address]    Culture >=100,000 COLONIES/mL KLEBSIELLA PNEUMONIAE (A)  Final   Report Status 01/09/2023 FINAL  Final   Organism ID, Bacteria KLEBSIELLA PNEUMONIAE (A)  Final      Susceptibility   Klebsiella pneumoniae - MIC*    AMPICILLIN RESISTANT Resistant     CEFAZOLIN <=4 SENSITIVE Sensitive     CEFEPIME <=0.12 SENSITIVE Sensitive     CEFTRIAXONE <=0.25 SENSITIVE Sensitive     CIPROFLOXACIN <=0.25 SENSITIVE Sensitive     GENTAMICIN <=1 SENSITIVE Sensitive     IMIPENEM <=0.25  SENSITIVE Sensitive     NITROFURANTOIN 64 INTERMEDIATE Intermediate     TRIMETH/SULFA <=20 SENSITIVE Sensitive     AMPICILLIN/SULBACTAM 4 SENSITIVE Sensitive     PIP/TAZO <=4 SENSITIVE Sensitive     * >=100,000 COLONIES/mL KLEBSIELLA PNEUMONIAE  Blood culture (routine x 2)     Status: None (Preliminary result)   Collection Time: 01/07/23  5:40 PM   Specimen: BLOOD  Result Value Ref Range Status   Specimen Description   Final    BLOOD LEFT ANTECUBITAL Performed at [facility], [address] [place], [place] [address]    Special Requests   Final    BOTTLES DRAWN AEROBIC AND ANAEROBIC Blood Culture adequate volume Performed at [facility], [address] [place], [place] [address]    Culture   Final    NO GROWTH 3 DAYS Performed at [facility], [address] [place], [place] [address]    Report Status PENDING  Incomplete    Labs: CBC: Recent  Labs  Lab 01/07/23 1750 01/08/23 0507 01/09/23 0439 01/10/23 0450 01/11/23 0444  WBC 17.8* 16.2* 12.2* 12.4* 12.7*  NEUTROABS 11.0* 10.1*  --   --   --   HGB 12.8 12.5 11.4* 11.2* 11.1*  HCT 39.5 39.4 35.7* 34.3* 33.6*  MCV 94.3 94.5 94.4 93.7 92.8  PLT 408* 380 352 389 386   Basic Metabolic Panel: Recent Labs  Lab 01/07/23 1750 01/08/23 0507 01/09/23 0439 01/10/23 0450 01/10/23 2307 01/11/23 0913  NA 133* 134* 134* 137 134*  --   K 4.1 3.5 3.6 3.6 3.3* 3.8  CL 97* 100 102 103 100  --   CO2 25 26 25 24 24   --   GLUCOSE 145* 167* 177* 163* 213*  --   BUN 10 9 8  7* 6*  --   CREATININE 0.40* 0.46 0.42* <0.30* 0.36*  --   CALCIUM 8.9 8.7* 8.1* 8.4* 8.2*  --   MG  --   --   --   --  1.3*  --   PHOS  --   --   --   --  2.9  --    Liver Function Tests: Recent Labs  Lab 01/07/23 1750  AST 26  ALT 23  ALKPHOS 60  BILITOT 1.0  PROT 7.0  ALBUMIN 3.4*    Discharge time spent: 35 minutes.  Signed: Jacquelin Hawking, MD [facility] 01/11/2023

## 2023-01-13 ENCOUNTER — Inpatient Hospital Stay (HOSPITAL_COMMUNITY)
Admission: EM | Admit: 2023-01-13 | Discharge: 2023-01-19 | DRG: 872 | Disposition: A | Discharge status: Home Health | Payer: Medicare Other | Attending: Internal Medicine | Admitting: Internal Medicine

## 2023-01-13 ENCOUNTER — Other Ambulatory Visit: Payer: Self-pay

## 2023-01-13 ENCOUNTER — Inpatient Hospital Stay (HOSPITAL_COMMUNITY): Payer: Medicare Other

## 2023-01-13 ENCOUNTER — Encounter (HOSPITAL_COMMUNITY): Payer: Self-pay

## 2023-01-13 DIAGNOSIS — D509 Iron deficiency anemia, unspecified: Secondary | ICD-10-CM | POA: Diagnosis present

## 2023-01-13 DIAGNOSIS — Z882 Allergy status to sulfonamides status: Secondary | ICD-10-CM

## 2023-01-13 DIAGNOSIS — Z833 Family history of diabetes mellitus: Secondary | ICD-10-CM

## 2023-01-13 DIAGNOSIS — I110 Hypertensive heart disease with heart failure: Secondary | ICD-10-CM | POA: Diagnosis present

## 2023-01-13 DIAGNOSIS — E1165 Type 2 diabetes mellitus with hyperglycemia: Secondary | ICD-10-CM | POA: Diagnosis present

## 2023-01-13 DIAGNOSIS — L03114 Cellulitis of left upper limb: Secondary | ICD-10-CM | POA: Diagnosis present

## 2023-01-13 DIAGNOSIS — Z880 Allergy status to penicillin: Secondary | ICD-10-CM | POA: Diagnosis not present

## 2023-01-13 DIAGNOSIS — Z6841 Body Mass Index (BMI) 40.0 and over, adult: Secondary | ICD-10-CM

## 2023-01-13 DIAGNOSIS — Z79890 Hormone replacement therapy (postmenopausal): Secondary | ICD-10-CM

## 2023-01-13 DIAGNOSIS — G8929 Other chronic pain: Secondary | ICD-10-CM | POA: Diagnosis present

## 2023-01-13 DIAGNOSIS — Z885 Allergy status to narcotic agent status: Secondary | ICD-10-CM

## 2023-01-13 DIAGNOSIS — A419 Sepsis, unspecified organism: Secondary | ICD-10-CM | POA: Diagnosis not present

## 2023-01-13 DIAGNOSIS — I5032 Chronic diastolic (congestive) heart failure: Secondary | ICD-10-CM | POA: Diagnosis present

## 2023-01-13 DIAGNOSIS — Z79899 Other long term (current) drug therapy: Secondary | ICD-10-CM

## 2023-01-13 DIAGNOSIS — Z888 Allergy status to other drugs, medicaments and biological substances status: Secondary | ICD-10-CM | POA: Diagnosis not present

## 2023-01-13 DIAGNOSIS — N390 Urinary tract infection, site not specified: Secondary | ICD-10-CM | POA: Diagnosis present

## 2023-01-13 DIAGNOSIS — Z7982 Long term (current) use of aspirin: Secondary | ICD-10-CM

## 2023-01-13 DIAGNOSIS — Z8249 Family history of ischemic heart disease and other diseases of the circulatory system: Secondary | ICD-10-CM | POA: Diagnosis not present

## 2023-01-13 DIAGNOSIS — Z91199 Patient's noncompliance with other medical treatment and regimen due to unspecified reason: Secondary | ICD-10-CM

## 2023-01-13 DIAGNOSIS — G14 Postpolio syndrome: Secondary | ICD-10-CM | POA: Diagnosis present

## 2023-01-13 DIAGNOSIS — Z7984 Long term (current) use of oral hypoglycemic drugs: Secondary | ICD-10-CM | POA: Diagnosis not present

## 2023-01-13 DIAGNOSIS — E039 Hypothyroidism, unspecified: Secondary | ICD-10-CM | POA: Diagnosis present

## 2023-01-13 DIAGNOSIS — Z820 Family history of epilepsy and other diseases of the nervous system: Secondary | ICD-10-CM

## 2023-01-13 DIAGNOSIS — Z87442 Personal history of urinary calculi: Secondary | ICD-10-CM

## 2023-01-13 DIAGNOSIS — A414 Sepsis due to anaerobes: Secondary | ICD-10-CM | POA: Diagnosis present

## 2023-01-13 DIAGNOSIS — Z9049 Acquired absence of other specified parts of digestive tract: Secondary | ICD-10-CM

## 2023-01-13 DIAGNOSIS — M8580 Other specified disorders of bone density and structure, unspecified site: Secondary | ICD-10-CM | POA: Diagnosis present

## 2023-01-13 DIAGNOSIS — Z90710 Acquired absence of both cervix and uterus: Secondary | ICD-10-CM

## 2023-01-13 DIAGNOSIS — E871 Hypo-osmolality and hyponatremia: Secondary | ICD-10-CM | POA: Diagnosis present

## 2023-01-13 DIAGNOSIS — E7800 Pure hypercholesterolemia, unspecified: Secondary | ICD-10-CM | POA: Diagnosis present

## 2023-01-13 DIAGNOSIS — E669 Obesity, unspecified: Secondary | ICD-10-CM | POA: Diagnosis present

## 2023-01-13 DIAGNOSIS — Z803 Family history of malignant neoplasm of breast: Secondary | ICD-10-CM

## 2023-01-13 DIAGNOSIS — M79642 Pain in left hand: Secondary | ICD-10-CM | POA: Diagnosis not present

## 2023-01-13 LAB — URINALYSIS, W/ REFLEX TO CULTURE (INFECTION SUSPECTED)
Bilirubin Urine: NEGATIVE
Glucose, UA: NEGATIVE mg/dL
Ketones, ur: NEGATIVE mg/dL
Nitrite: NEGATIVE
Protein, ur: NEGATIVE mg/dL
Specific Gravity, Urine: 1.009 (ref 1.005–1.030)
WBC, UA: >50 WBC/hpf (ref 0–5)
pH: 6 (ref 5.0–8.0)

## 2023-01-13 LAB — GLUCOSE, CAPILLARY
Glucose-Capillary: 129 mg/dL — ABNORMAL HIGH (ref 70–99)
Glucose-Capillary: 133 mg/dL — ABNORMAL HIGH (ref 70–99)
Glucose-Capillary: 160 mg/dL — ABNORMAL HIGH (ref 70–99)

## 2023-01-13 LAB — CBC WITH DIFFERENTIAL/PLATELET
Abs Immature Granulocytes: 0.1 10*3/uL — ABNORMAL HIGH (ref 0.00–0.07)
Basophils Absolute: 0.1 10*3/uL (ref 0.0–0.1)
Basophils Relative: 1 %
Eosinophils Absolute: 0.8 10*3/uL — ABNORMAL HIGH (ref 0.0–0.5)
Eosinophils Relative: 5 %
HCT: 33.8 % — ABNORMAL LOW (ref 36.0–46.0)
Hemoglobin: 11.2 g/dL — ABNORMAL LOW (ref 12.0–15.0)
Immature Granulocytes: 1 %
Lymphocytes Relative: 20 %
Lymphs Abs: 3 10*3/uL (ref 0.7–4.0)
MCH: 30.2 pg (ref 26.0–34.0)
MCHC: 33.1 g/dL (ref 30.0–36.0)
MCV: 91.1 fL (ref 80.0–100.0)
Monocytes Absolute: 1.5 10*3/uL — ABNORMAL HIGH (ref 0.1–1.0)
Monocytes Relative: 10 %
Neutro Abs: 9.2 10*3/uL — ABNORMAL HIGH (ref 1.7–7.7)
Neutrophils Relative %: 63 %
Platelets: 399 10*3/uL (ref 150–400)
RBC: 3.71 MIL/uL — ABNORMAL LOW (ref 3.87–5.11)
RDW: 14 % (ref 11.5–15.5)
WBC: 14.6 10*3/uL — ABNORMAL HIGH (ref 4.0–10.5)
nRBC: 0 % (ref 0.0–0.2)

## 2023-01-13 LAB — CULTURE, BLOOD (ROUTINE X 2)
Culture: NO GROWTH
Culture: NO GROWTH
Special Requests: ADEQUATE
Special Requests: ADEQUATE

## 2023-01-13 LAB — CBG MONITORING, ED: Glucose-Capillary: 132 mg/dL — ABNORMAL HIGH (ref 70–99)

## 2023-01-13 LAB — BASIC METABOLIC PANEL
Anion gap: 11 (ref 5–15)
BUN: 9 mg/dL (ref 8–23)
CO2: 23 mmol/L (ref 22–32)
Calcium: 8.8 mg/dL — ABNORMAL LOW (ref 8.9–10.3)
Chloride: 96 mmol/L — ABNORMAL LOW (ref 98–111)
Creatinine, Ser: 0.37 mg/dL — ABNORMAL LOW (ref 0.44–1.00)
GFR, Estimated: >60 mL/min (ref >60)
Glucose, Bld: 189 mg/dL — ABNORMAL HIGH (ref 70–99)
Potassium: 3.8 mmol/L (ref 3.5–5.1)
Sodium: 130 mmol/L — ABNORMAL LOW (ref 135–145)

## 2023-01-13 LAB — I-STAT CG4 LACTIC ACID, ED
Lactic Acid, Venous: 2.2 mmol/L (ref 0.5–1.9)
Lactic Acid, Venous: 2 mmol/L (ref 0.5–1.9)

## 2023-01-13 MED ORDER — CARVEDILOL 25 MG PO TABS
25.0000 mg | ORAL_TABLET | Freq: Two times a day (BID) | ORAL | Status: DC
  Administered 2023-01-13 – 2023-01-19 (×12): 25 mg via ORAL
  Filled 2023-01-13 (×13): qty 1

## 2023-01-13 MED ORDER — GABAPENTIN 300 MG PO CAPS
300.0000 mg | ORAL_CAPSULE | Freq: Two times a day (BID) | ORAL | Status: DC
  Administered 2023-01-13 – 2023-01-19 (×13): 300 mg via ORAL
  Filled 2023-01-13 (×13): qty 1

## 2023-01-13 MED ORDER — SODIUM CHLORIDE 0.9 % IV SOLN
1000.0000 mL | INTRAVENOUS | Status: DC
  Administered 2023-01-13 – 2023-01-14 (×4): 1000 mL via INTRAVENOUS

## 2023-01-13 MED ORDER — INSULIN ASPART 100 UNIT/ML IJ SOLN
0.0000 [IU] | Freq: Every day | INTRAMUSCULAR | Status: DC
  Administered 2023-01-14: 2 [IU] via SUBCUTANEOUS
  Filled 2023-01-13: qty 0.05

## 2023-01-13 MED ORDER — PANTOPRAZOLE SODIUM 40 MG PO TBEC
40.0000 mg | DELAYED_RELEASE_TABLET | Freq: Every day | ORAL | Status: DC
  Administered 2023-01-13 – 2023-01-18 (×6): 40 mg via ORAL
  Filled 2023-01-13 (×6): qty 1

## 2023-01-13 MED ORDER — SODIUM CHLORIDE 0.9 % IV SOLN
2.0000 g | Freq: Once | INTRAVENOUS | Status: AC
  Administered 2023-01-13: 2 g via INTRAVENOUS
  Filled 2023-01-13: qty 20

## 2023-01-13 MED ORDER — ZOLPIDEM TARTRATE 5 MG PO TABS
5.0000 mg | ORAL_TABLET | Freq: Every evening | ORAL | Status: DC | PRN
  Administered 2023-01-13 – 2023-01-18 (×5): 5 mg via ORAL
  Filled 2023-01-13 (×5): qty 1

## 2023-01-13 MED ORDER — TIZANIDINE HCL 4 MG PO TABS
4.0000 mg | ORAL_TABLET | Freq: Every day | ORAL | Status: DC
  Administered 2023-01-13 – 2023-01-18 (×6): 4 mg via ORAL
  Filled 2023-01-13 (×6): qty 1

## 2023-01-13 MED ORDER — FERROUS SULFATE 325 (65 FE) MG PO TABS
325.0000 mg | ORAL_TABLET | Freq: Every day | ORAL | Status: DC
  Administered 2023-01-13 – 2023-01-18 (×6): 325 mg via ORAL
  Filled 2023-01-13 (×6): qty 1

## 2023-01-13 MED ORDER — SODIUM CHLORIDE 0.9 % IV BOLUS (SEPSIS)
1000.0000 mL | Freq: Once | INTRAVENOUS | Status: AC
  Administered 2023-01-13: 1000 mL via INTRAVENOUS

## 2023-01-13 MED ORDER — INSULIN ASPART 100 UNIT/ML IJ SOLN
0.0000 [IU] | Freq: Three times a day (TID) | INTRAMUSCULAR | Status: DC
  Administered 2023-01-13 – 2023-01-14 (×4): 2 [IU] via SUBCUTANEOUS
  Administered 2023-01-15: 3 [IU] via SUBCUTANEOUS
  Administered 2023-01-15: 2 [IU] via SUBCUTANEOUS
  Administered 2023-01-15: 3 [IU] via SUBCUTANEOUS
  Administered 2023-01-16: 2 [IU] via SUBCUTANEOUS
  Administered 2023-01-16: 3 [IU] via SUBCUTANEOUS
  Administered 2023-01-17 (×2): 2 [IU] via SUBCUTANEOUS
  Administered 2023-01-17 – 2023-01-18 (×2): 3 [IU] via SUBCUTANEOUS
  Administered 2023-01-18 – 2023-01-19 (×2): 2 [IU] via SUBCUTANEOUS
  Administered 2023-01-19: 5 [IU] via SUBCUTANEOUS
  Filled 2023-01-13: qty 0.15

## 2023-01-13 MED ORDER — CEFAZOLIN SODIUM-DEXTROSE 1-4 GM/50ML-% IV SOLN
1.0000 g | Freq: Three times a day (TID) | INTRAVENOUS | Status: DC
  Administered 2023-01-14 – 2023-01-19 (×16): 1 g via INTRAVENOUS
  Filled 2023-01-13 (×16): qty 50

## 2023-01-13 MED ORDER — ENOXAPARIN SODIUM 40 MG/0.4ML IJ SOSY
40.0000 mg | PREFILLED_SYRINGE | INTRAMUSCULAR | Status: DC
  Administered 2023-01-13 – 2023-01-19 (×7): 40 mg via SUBCUTANEOUS
  Filled 2023-01-13 (×7): qty 0.4

## 2023-01-13 MED ORDER — IRBESARTAN 150 MG PO TABS
150.0000 mg | ORAL_TABLET | Freq: Every day | ORAL | Status: DC
  Administered 2023-01-13 – 2023-01-17 (×5): 150 mg via ORAL
  Filled 2023-01-13 (×5): qty 1

## 2023-01-13 MED ORDER — ATORVASTATIN CALCIUM 40 MG PO TABS
40.0000 mg | ORAL_TABLET | Freq: Every day | ORAL | Status: DC
  Administered 2023-01-13 – 2023-01-19 (×7): 40 mg via ORAL
  Filled 2023-01-13 (×7): qty 1

## 2023-01-13 MED ORDER — HYDROXYZINE HCL 25 MG PO TABS
25.0000 mg | ORAL_TABLET | Freq: Every day | ORAL | Status: DC
  Administered 2023-01-13 – 2023-01-18 (×6): 25 mg via ORAL
  Filled 2023-01-13 (×6): qty 1

## 2023-01-13 MED ORDER — IRON (FERROUS SULFATE) 325 (65 FE) MG PO TABS: 1.0000 | ORAL_TABLET | Freq: Every day | ORAL | Status: DC

## 2023-01-13 MED ORDER — MIRABEGRON ER 25 MG PO TB24
50.0000 mg | ORAL_TABLET | Freq: Every day | ORAL | Status: DC
  Administered 2023-01-13 – 2023-01-19 (×7): 50 mg via ORAL
  Filled 2023-01-13 (×7): qty 2

## 2023-01-13 MED ORDER — SACCHAROMYCES BOULARDII 250 MG PO CAPS
250.0000 mg | ORAL_CAPSULE | Freq: Every day | ORAL | Status: DC
  Administered 2023-01-13 – 2023-01-19 (×7): 250 mg via ORAL
  Filled 2023-01-13 (×7): qty 1

## 2023-01-13 MED ORDER — SENNOSIDES-DOCUSATE SODIUM 8.6-50 MG PO TABS: 1.0000 | ORAL_TABLET | Freq: Every evening | ORAL | Status: DC | PRN

## 2023-01-13 MED ORDER — LEVOTHYROXINE SODIUM 100 MCG PO TABS
100.0000 ug | ORAL_TABLET | Freq: Every day | ORAL | Status: DC
  Administered 2023-01-13 – 2023-01-19 (×7): 100 ug via ORAL
  Filled 2023-01-13 (×7): qty 1

## 2023-01-13 MED ORDER — TRAMADOL HCL 50 MG PO TABS
50.0000 mg | ORAL_TABLET | Freq: Four times a day (QID) | ORAL | Status: DC | PRN
  Administered 2023-01-13 – 2023-01-19 (×12): 50 mg via ORAL
  Filled 2023-01-13 (×12): qty 1

## 2023-01-13 MED ORDER — CLONAZEPAM 0.5 MG PO TABS
0.5000 mg | ORAL_TABLET | Freq: Every day | ORAL | Status: DC
  Administered 2023-01-13 – 2023-01-18 (×6): 0.5 mg via ORAL
  Filled 2023-01-13 (×6): qty 1

## 2023-01-13 MED ORDER — HYDROXYZINE PAMOATE 25 MG PO CAPS: 25.0000 mg | ORAL_CAPSULE | Freq: Every day | ORAL | Status: DC

## 2023-01-13 MED ORDER — TORSEMIDE 10 MG PO TABS
5.0000 mg | ORAL_TABLET | Freq: Every day | ORAL | Status: DC
  Administered 2023-01-13 – 2023-01-19 (×7): 5 mg via ORAL
  Filled 2023-01-13 (×7): qty 0.5

## 2023-01-13 MED ORDER — ALBUTEROL SULFATE (2.5 MG/3ML) 0.083% IN NEBU: 2.5000 mg | INHALATION_SOLUTION | RESPIRATORY_TRACT | Status: DC | PRN

## 2023-01-13 MED ORDER — IRBESARTAN 300 MG PO TABS
300.0000 mg | ORAL_TABLET | Freq: Every day | ORAL | Status: DC
Start: 2023-01-13 — End: 2023-01-13

## 2023-01-13 MED ORDER — AMITRIPTYLINE HCL 25 MG PO TABS
25.0000 mg | ORAL_TABLET | Freq: Every day | ORAL | Status: DC
  Administered 2023-01-13 – 2023-01-18 (×6): 25 mg via ORAL
  Filled 2023-01-13 (×6): qty 1

## 2023-01-13 NOTE — Progress Notes (Addendum)
Elink monitoring for the code sepsis protocol.  7829  Notified bedside nurse of need to draw repeat lactic acid.

## 2023-01-13 NOTE — ED Notes (Signed)
X-ray at bedside

## 2023-01-13 NOTE — ED Triage Notes (Signed)
Ems reports , patient was at the ED with a UTI WO any pain now, pt has pain.  Vitals WN Temp 99.6

## 2023-01-13 NOTE — ED Notes (Signed)
ED TO INPATIENT HANDOFF REPORT  Name/Age/Gender [name] 77 y.o. female  Code Status    Code Status Orders  (From admission, onward)           Start     Ordered   01/13/23 0759  Full code  Continuous       Question:  By:  Answer:  Consent: discussion documented in EHR   01/13/23 0759           Code Status History     Date Active Date Inactive Code Status Order ID Comments User Context   01/07/2023 2101 01/11/2023 2111 Full Code [id number]  Gery Pray, MD ED   10/25/2022 1557 11/01/2022 1745 Full Code [id number]  Clydie Braun, MD Inpatient   07/18/2013 1459 07/22/2013 2023 Full Code [id number]  Verlee Rossetti, MD Inpatient       Home/SNF/Other Home  Chief Complaint Sepsis secondary to UTI (HCC) [A41.9, N39.0]  Level of Care/Admitting Diagnosis ED Disposition     ED Disposition  Admit   Condition  --   Comment  Hospital Area: [facility] [[id number]]  Level of Care: Progressive [102]  Admit to Progressive based on following criteria: MULTISYSTEM THREATS such as stable sepsis, metabolic/electrolyte imbalance with or without encephalopathy that is responding to early treatment.  May admit patient to [facility] or [facility] if equivalent level of care is available:: Yes  Covid Evaluation: Asymptomatic - no recent exposure (last 10 days) testing not required  Diagnosis: Sepsis secondary to UTI [facility]) [[id number]]  Admitting Physician: Maryln Gottron [[id number]]  Attending Physician: Kirby Crigler, MIR [email or web address] [[id number]]  Certification:: I certify this patient will need inpatient services for at least 2 midnights  Expected Medical Readiness: 01/15/2023          Medical History Past Medical History:  Diagnosis Date   Angina pectoris (HCC) 08/02/2016   Anxiety    Arthritis    "hands, knees, back" (07/18/2013)   Asthma    CHF (congestive heart failure) (HCC)    Chronic back pain    Chronic lower back pain    Family history of anesthesia  complication    Mother and Sister- N/V   GERD (gastroesophageal reflux disease)    H/O hiatal hernia    Headache(784.0)    "weekly" (07/18/2013)   Hypertension    Hypothyroidism    Insomnia    Kidney stones    Memory changes    Mental disorder    Migraine    "years ago" (07/18/2013)   Osteopenia    Post-polio syndrome    Pure hypercholesterolemia 02/07/2021   Sleep apnea    Takotsubo cardiomyopathy    Type II diabetes mellitus (HCC)    Ulcer of leg, chronic (HCC) 02/07/2021    Allergies Allergies  Allergen Reactions   Tamsulosin Other (See Comments)    Caused uncontrollable movements   Ciprofloxacin Rash   Carisoprodol Itching    Other Reaction(s): Unknown   Oxycodone-Acetaminophen     Other Reaction(s): ITCHING   Penicillins Itching    Pt has tolerated rocephin IV  Other Reaction(s): ITCHING   Sulfa Antibiotics Other (See Comments)    Unknown.  States as always told had an allergy  Other Reaction(s): Unknown   Acetaminophen Itching    Other Reaction(s): ITCHING   Aspirin Itching and Other (See Comments)    Chest pain, also   Macrodantin [Nitrofurantoin Macrocrystal] Rash   Nsaids Other (See Comments)    Chest pain and stomach pain  Other Reaction(s): Unknown   Percocet [Oxycodone-Acetaminophen] Itching   Propoxyphene Other (See Comments)    Chest/stomach pain  Other Reaction(s): ITCHING   Propoxyphene N-Acetaminophen Other (See Comments)    Chest/stomach pain    IV Location/Drains/Wounds Patient Lines/Drains/Airways Status     Active Line/Drains/Airways     Name Placement date Placement time Site Days   Peripheral IV 01/13/23 Anterior;Distal;Left Forearm 01/13/23  0148  Forearm  less than 1   Peripheral IV 01/13/23 20 G Anterior;Right Wrist 01/13/23  0422  Wrist  less than 1   Wound / Incision (Open or Dehisced) 10/25/22 Skin tear Pretibial Distal;Left;Lateral 10/25/22  --  Pretibial  80   Wound / Incision (Open or Dehisced) 01/08/23 Other (Comment)  Sacrum Right 01/08/23  --  Sacrum  5   Wound / Incision (Open or Dehisced) 01/08/23 Non-pressure wound Pretibial Left 01/08/23  --  Pretibial  5            Labs/Imaging Results for orders placed or performed during the hospital encounter of 01/13/23 (from the past 48 hour(s))  CBC with Differential     Status: Abnormal   Collection Time: 01/13/23  1:47 AM  Result Value Ref Range   WBC 14.6 (H) 4.0 - 10.5 K/uL   RBC 3.71 (L) 3.87 - 5.11 MIL/uL   Hemoglobin 11.2 (L) 12.0 - 15.0 g/dL   HCT 72.5 (L) 36.6 - 44.0 %   MCV 91.1 80.0 - 100.0 fL   MCH 30.2 26.0 - 34.0 pg   MCHC 33.1 30.0 - 36.0 g/dL   RDW 34.7 42.5 - 95.6 %   Platelets 399 150 - 400 K/uL   nRBC 0.0 0.0 - 0.2 %   Neutrophils Relative % 63 %   Neutro Abs 9.2 (H) 1.7 - 7.7 K/uL   Lymphocytes Relative 20 %   Lymphs Abs 3.0 0.7 - 4.0 K/uL   Monocytes Relative 10 %   Monocytes Absolute 1.5 (H) 0.1 - 1.0 K/uL   Eosinophils Relative 5 %   Eosinophils Absolute 0.8 (H) 0.0 - 0.5 K/uL   Basophils Relative 1 %   Basophils Absolute 0.1 0.0 - 0.1 K/uL   Immature Granulocytes 1 %   Abs Immature Granulocytes 0.10 (H) 0.00 - 0.07 K/uL    Comment: Performed at [facility], [address] [place], [place] [address]  Basic metabolic panel     Status: Abnormal   Collection Time: 01/13/23  1:47 AM  Result Value Ref Range   Sodium 130 (L) 135 - 145 mmol/L   Potassium 3.8 3.5 - 5.1 mmol/L   Chloride 96 (L) 98 - 111 mmol/L   CO2 23 22 - 32 mmol/L   Glucose, Bld 189 (H) 70 - 99 mg/dL    Comment: Glucose reference range applies only to samples taken after fasting for at least 8 hours.   BUN 9 8 - 23 mg/dL   Creatinine, Ser 4.33 (L) 0.44 - 1.00 mg/dL   Calcium 8.8 (L) 8.9 - 10.3 mg/dL   GFR, Estimated >29 >51 mL/min    Comment: (NOTE) Calculated using the CKD-EPI Creatinine Equation (2021)    Anion gap 11 5 - 15    Comment: Performed at [facility], [address], [place], [place] [address]   Urinalysis, w/ Reflex to Culture (Infection Suspected) -Urine, Catheterized     Status: Abnormal   Collection Time: 01/13/23  2:02 AM  Result Value Ref Range   Specimen Source URINE, CLEAN CATCH  Color, Urine YELLOW YELLOW   APPearance TURBID (A) CLEAR   Specific Gravity, Urine 1.009 1.005 - 1.030   pH 6.0 5.0 - 8.0   Glucose, UA NEGATIVE NEGATIVE mg/dL   Hgb urine dipstick SMALL (A) NEGATIVE   Bilirubin Urine NEGATIVE NEGATIVE   Ketones, ur NEGATIVE NEGATIVE mg/dL   Protein, ur NEGATIVE NEGATIVE mg/dL   Nitrite NEGATIVE NEGATIVE   Leukocytes,Ua LARGE (A) NEGATIVE   RBC / HPF 21-50 0 - 5 RBC/hpf   WBC, UA >50 0 - 5 WBC/hpf    Comment:        Reflex urine culture not performed if WBC <=10, OR if Squamous epithelial cells >5. If Squamous epithelial cells >5 suggest recollection.    Bacteria, UA RARE (A) NONE SEEN   Squamous Epithelial / HPF 0-5 0 - 5 /HPF   WBC Clumps PRESENT    Budding Yeast PRESENT     Comment: Performed at [facility], [address], [place], [place] [address]  I-Stat CG4 Lactic Acid     Status: Abnormal   Collection Time: 01/13/23  2:07 AM  Result Value Ref Range   Lactic Acid, Venous 2.2 (HH) 0.5 - 1.9 mmol/L   Comment NOTIFIED PHYSICIAN   I-Stat CG4 Lactic Acid     Status: Abnormal   Collection Time: 01/13/23  4:53 AM  Result Value Ref Range   Lactic Acid, Venous 2.0 (HH) 0.5 - 1.9 mmol/L  CBG monitoring, ED     Status: Abnormal   Collection Time: 01/13/23  8:46 AM  Result Value Ref Range   Glucose-Capillary 132 (H) 70 - 99 mg/dL    Comment: Glucose reference range applies only to samples taken after fasting for at least 8 hours.   DG CHEST PORT 1 VIEW  Result Date: 01/13/2023 CLINICAL DATA:  77 year old female with a history of post-polio paralysis. Recent hospitalization for pyelonephritis. Malaise, chills. EXAM: PORTABLE CHEST 1 VIEW COMPARISON:  Portable chest 01/07/2023 and earlier. FINDINGS: Portable AP semi upright view  at 0829 hours. More patient rotation to the left, similar somewhat low lung volumes. Mediastinal contours are stable and within normal limits. Visualized tracheal air column is within normal limits. Allowing for portable technique the lungs are clear. No pneumothorax. No acute osseous abnormality identified. IMPRESSION: More rotated to the left.  No acute cardiopulmonary abnormality. Electronically Signed   By: Odessa Fleming M.D.   On: 01/13/2023 08:59    Pending Labs Unresulted Labs (From admission, onward)     Start     Ordered   01/14/23 0500  Basic metabolic panel  Tomorrow morning,   R        01/13/23 0759   01/14/23 0500  CBC  Tomorrow morning,   R        01/13/23 0759   01/13/23 0319  Blood culture (routine x 2)  BLOOD CULTURE X 2,   R (with STAT occurrences)      01/13/23 0319   01/13/23 0202  Urine Culture  Once,   R        01/13/23 0202            Vitals/Pain Today's Vitals   01/13/23 0700 01/13/23 0730 01/13/23 0825 01/13/23 1056  BP: 133/65 (!) 118/55  (!) 159/68  Pulse: (!) 56 (!) 47 66 (!) 56  Resp:    16  Temp:   98.1 F (36.7 C) 98.3 F (36.8 C)  TempSrc:   Oral Oral  SpO2: 96% 93% 96% 97%  Weight:      Height:        Isolation Precautions No active isolations  Medications Medications  sodium chloride 0.9 % bolus 1,000 mL (1,000 mLs Intravenous New Bag/Given 01/13/23 0341)    Followed by  0.9 %  sodium chloride infusion (1,000 mLs Intravenous New Bag/Given 01/13/23 0507)  ceFAZolin (ANCEF) IVPB 1 g/50 mL premix (has no administration in time range)  atorvastatin (LIPITOR) tablet 40 mg (40 mg Oral Given 01/13/23 0912)  carvedilol (COREG) tablet 25 mg (25 mg Oral Not Given 01/13/23 0835)  torsemide (DEMADEX) tablet 5 mg (has no administration in time range)  amitriptyline (ELAVIL) tablet 25 mg (has no administration in time range)  hydrOXYzine (VISTARIL) capsule 25 mg (has no administration in time range)  zolpidem (AMBIEN) tablet 5 mg (has no administration in  time range)  levothyroxine (SYNTHROID) tablet 100 mcg (100 mcg Oral Given 01/13/23 0912)  pantoprazole (PROTONIX) EC tablet 40 mg (40 mg Oral Given 01/13/23 0912)  saccharomyces boulardii (FLORASTOR) capsule 250 mg (250 mg Oral Given 01/13/23 0913)  mirabegron ER (MYRBETRIQ) tablet 50 mg (50 mg Oral Given 01/13/23 0913)  clonazePAM (KLONOPIN) tablet 0.5 mg (has no administration in time range)  gabapentin (NEURONTIN) capsule 300 mg (300 mg Oral Given 01/13/23 0912)  tiZANidine (ZANAFLEX) tablet 4 mg (has no administration in time range)  enoxaparin (LOVENOX) injection 40 mg (40 mg Subcutaneous Given 01/13/23 0912)  senna-docusate (Senokot-S) tablet 1 tablet (has no administration in time range)  albuterol (PROVENTIL) (2.5 MG/3ML) 0.083% nebulizer solution 2.5 mg (has no administration in time range)  ferrous sulfate tablet 325 mg (325 mg Oral Given 01/13/23 0912)  insulin aspart (novoLOG) injection 0-15 Units (2 Units Subcutaneous Given 01/13/23 0915)  insulin aspart (novoLOG) injection 0-5 Units (has no administration in time range)  cefTRIAXone (ROCEPHIN) 2 g in sodium chloride 0.9 % 100 mL IVPB (0 g Intravenous Stopped 01/13/23 0410)    Mobility non-ambulatory

## 2023-01-13 NOTE — ED Provider Notes (Signed)
[place] EMERGENCY DEPARTMENT AT [facility] Provider Note  CSN: [id number] Arrival date & time: 01/13/23 6948  Chief Complaint(s) UTI  HPI [name] is a 77 y.o. female with a past medical history listed below including postpolio lower extremity weakness who was recently admitted for urosepsis related to Klebsiella pneumonia and treated with IV Rocephin.  Discharged 2 days ago on Duricef but the prescription was delayed getting delivered to her house.  She returns today for generalized malaise and myalgias with rigors at home.  No nausea or vomiting.  No other physical complaints.  HPI  Past Medical History Past Medical History:  Diagnosis Date   Angina pectoris (HCC) 08/02/2016   Anxiety    Arthritis    "hands, knees, back" (07/18/2013)   Asthma    CHF (congestive heart failure) (HCC)    Chronic back pain    Chronic lower back pain    Family history of anesthesia complication    Mother and Sister- N/V   GERD (gastroesophageal reflux disease)    H/O hiatal hernia    Headache(784.0)    "weekly" (07/18/2013)   Hypertension    Hypothyroidism    Insomnia    Kidney stones    Memory changes    Mental disorder    Migraine    "years ago" (07/18/2013)   Osteopenia    Post-polio syndrome    Pure hypercholesterolemia 02/07/2021   Sleep apnea    Takotsubo cardiomyopathy    Type II diabetes mellitus (HCC)    Ulcer of leg, chronic (HCC) 02/07/2021   Patient Active Problem List   Diagnosis Date Noted   Acute pyelonephritis 01/07/2023   T2DM (type 2 diabetes mellitus) (HCC) 01/07/2023   Septic shock (HCC) 10/28/2022   Encephalopathy acute 10/28/2022   Sepsis due to Escherichia coli with encephalopathy and septic shock (HCC) 10/27/2022   UTI (urinary tract infection) 10/25/2022   Right knee pain 10/25/2022   Hyponatremia 10/25/2022   Leg wound, left 10/25/2022   Hyperlipidemia 10/25/2022   Obesity (BMI 30-39.9) 10/25/2022   Wheelchair dependence 05/30/2021    Ulcer of leg, chronic (HCC) 02/07/2021   Pure hypercholesterolemia 02/07/2021   Angina pectoris (HCC) 08/02/2016   Post-polio syndrome 07/20/2013            07/20/2013   Left patella fracture 07/18/2013   Hypothyroidism 03/12/2009   Controlled type 2 diabetes mellitus without complication, without long-term current use of insulin (HCC) 03/12/2009   Essential hypertension 03/12/2009   ATRIAL FIBRILLATION 03/12/2009   TAKOTSUBO SYNDROME 03/12/2009   Edema 03/12/2009   Home Medication(s) Prior to Admission medications   Medication Sig Start Date End Date Taking? Authorizing Provider  amitriptyline (ELAVIL) 25 MG tablet Take 25 mg by mouth at bedtime.   Yes [provider]  ascorbic acid (VITAMIN C) 500 MG tablet Take 500 mg by mouth daily.   Yes [provider]  atorvastatin (LIPITOR) 40 MG tablet Take 40 mg by mouth daily.   Yes [provider]  b complex vitamins tablet Take 1 tablet by mouth daily.   Yes [provider]  carvedilol (COREG) 25 MG tablet Take 25 mg by mouth 2 (two) times daily with a meal.   Yes [provider]  cefadroxil (DURICEF) 500 MG capsule Take 1 capsule (500 mg total) by mouth 2 (two) times daily for 2 days. 01/12/23 01/14/23 Yes Narda Bonds, MD  cholecalciferol (VITAMIN D3) 25 MCG (1000 UNIT) tablet Take 1,000 Units by mouth daily.  Yes [provider]  clonazePAM (KLONOPIN) 0.5 MG tablet Take 0.5 mg by mouth at bedtime.   Yes [provider]  gabapentin (NEURONTIN) 300 MG capsule Take 300 mg by mouth 2 (two) times daily.   Yes [provider]  hydrOXYzine (VISTARIL) 25 MG capsule Take 25 mg by mouth at bedtime. 12/31/22  Yes [provider]  Iron, Ferrous Sulfate, 325 (65 Fe) MG TABS Take 1 tablet by mouth daily.   Yes [provider]  levothyroxine (SYNTHROID, LEVOTHROID) 100 MCG tablet Take 100 mcg by mouth daily before breakfast.   Yes [provider]  metFORMIN  (GLUCOPHAGE) 500 MG tablet Take 500 mg by mouth 2 (two) times daily.   Yes [provider]  mirabegron ER (MYRBETRIQ) 50 MG TB24 tablet Take 50 mg by mouth daily.   Yes [provider]  Multiple Vitamins-Minerals (MULTIVITAMIN WITH MINERALS) tablet Take 1 tablet by mouth daily.   Yes [provider]  nitroGLYCERIN (NITROSTAT) 0.4 MG SL tablet Place 1 tablet (0.4 mg total) under the tongue every 5 (five) minutes as needed for chest pain. 12/07/22  Yes Alver Sorrow, NP  pantoprazole (PROTONIX) 40 MG tablet Take 1 tablet by mouth daily. Take 1 tab daily 12/21/14  Yes [provider]  saccharomyces boulardii (FLORASTOR) 250 MG capsule Take 250 mg by mouth daily. 12/15/22  Yes [provider]  telmisartan (MICARDIS) 80 MG tablet Take 40 mg by mouth daily. Pt takes half of a tab daily 12/14/14  Yes [provider]  tiZANidine (ZANAFLEX) 4 MG tablet Take 4 mg by mouth at bedtime. PAIN   Yes [provider]  torsemide (DEMADEX) 5 MG tablet Take 5 mg by mouth daily.   Yes [provider]  Zinc 50 MG TABS Take 50 mg by mouth daily.   Yes [provider]  zolpidem (AMBIEN) 5 MG tablet Take 5 mg by mouth at bedtime as needed for sleep.   Yes [provider]  aspirin 81 MG tablet Take 81 mg by mouth at bedtime.  Patient not taking: Reported on 01/13/2023    [provider]  loratadine (CLARITIN) 10 MG tablet Take 10 mg by mouth daily as needed for allergies. 12/17/19   [provider]  vitamin E 180 MG (400 UNITS) capsule Take 400 Units by mouth daily.    [provider]                                                                                                                                    Allergies Tamsulosin, Ciprofloxacin, Carisoprodol, Oxycodone-acetaminophen, Penicillins, Sulfa antibiotics, Acetaminophen, Aspirin, Macrodantin [nitrofurantoin macrocrystal], Nsaids, Percocet  [oxycodone-acetaminophen], Propoxyphene, and Propoxyphene n-acetaminophen  Review of Systems Review of Systems As noted in HPI  Physical Exam Vital Signs  I have reviewed the triage vital signs BP (!) 103/48   Pulse (!) 56   Temp 98.2 F (36.8 C)  Resp 17   Ht 5' (1.524 m)   Wt 107.5 kg   SpO2 94%   BMI 46.29 kg/m   Physical Exam Vitals reviewed.  Constitutional:      General: She is not in acute distress.    Appearance: She is well-developed. She is not diaphoretic.  HENT:     Head: Normocephalic and atraumatic.     Right Ear: External ear normal.     Left Ear: External ear normal.     Nose: Nose normal.  Eyes:     General: No scleral icterus.    Conjunctiva/sclera: Conjunctivae normal.  Neck:     Trachea: Phonation normal.  Cardiovascular:     Rate and Rhythm: Normal rate and regular rhythm.  Pulmonary:     Effort: Pulmonary effort is normal. No respiratory distress.     Breath sounds: No stridor.  Abdominal:     General: There is no distension.  Musculoskeletal:        General: Normal range of motion.     Cervical back: Normal range of motion.  Neurological:     Mental Status: She is alert and oriented to person, place, and time.     Comments: Baseline lower extremity weakness  Psychiatric:        Behavior: Behavior normal.     ED Results and Treatments Labs (all labs ordered are listed, but only abnormal results are displayed) Labs Reviewed  CBC WITH DIFFERENTIAL/PLATELET - Abnormal; Notable for the following components:      Result Value   WBC 14.6 (*)    RBC 3.71 (*)    Hemoglobin 11.2 (*)    HCT 33.8 (*)    Neutro Abs 9.2 (*)    Monocytes Absolute 1.5 (*)    Eosinophils Absolute 0.8 (*)    Abs Immature Granulocytes 0.10 (*)    All other components within normal limits  BASIC METABOLIC PANEL - Abnormal; Notable for the following components:   Sodium 130 (*)    Chloride 96 (*)    Glucose, Bld 189 (*)    Creatinine, Ser 0.37 (*)    Calcium  8.8 (*)    All other components within normal limits  URINALYSIS, W/ REFLEX TO CULTURE (INFECTION SUSPECTED) - Abnormal; Notable for the following components:   APPearance TURBID (*)    Hgb urine dipstick SMALL (*)    Leukocytes,Ua LARGE (*)    Bacteria, UA RARE (*)    All other components within normal limits  I-STAT CG4 LACTIC ACID, ED - Abnormal; Notable for the following components:   Lactic Acid, Venous 2.2 (*)    All other components within normal limits  URINE CULTURE  CULTURE, BLOOD (ROUTINE X 2)  CULTURE, BLOOD (ROUTINE X 2)  I-STAT CG4 LACTIC ACID, ED                                                                                                                         EKG  EKG Interpretation  Date/Time:    Ventricular Rate:    PR Interval:    QRS Duration:    QT Interval:    QTC Calculation:   R Axis:      Text Interpretation:         Radiology No results found.  Medications Ordered in ED Medications  sodium chloride 0.9 % bolus 1,000 mL (1,000 mLs Intravenous New Bag/Given 01/13/23 0341)    Followed by  0.9 %  sodium chloride infusion (has no administration in time range)  ceFAZolin (ANCEF) IVPB 1 g/50 mL premix (has no administration in time range)  cefTRIAXone (ROCEPHIN) 2 g in sodium chloride 0.9 % 100 mL IVPB (2 g Intravenous New Bag/Given 01/13/23 0340)   Procedures .Critical Care  Performed by: Nira Conn, MD Authorized by: Nira Conn, MD   Critical care provider statement:    Critical care time (minutes):  45   Critical care time was exclusive of:  Separately billable procedures and treating other patients   Critical care was necessary to treat or prevent imminent or life-threatening deterioration of the following conditions:  Sepsis   Critical care was time spent personally by me on the following activities:  Development of treatment plan with patient or surrogate, discussions with consultants, evaluation of patient's  response to treatment, examination of patient, obtaining history from patient or surrogate, review of old charts, re-evaluation of patient's condition, pulse oximetry, ordering and review of radiographic studies, ordering and review of laboratory studies and ordering and performing treatments and interventions   Care discussed with: admitting provider     (including critical care time) Medical Decision Making / ED Course   Medical Decision Making Amount and/or Complexity of Data Reviewed Labs: ordered. Decision-making details documented in ED Course.  Risk Prescription drug management. Decision regarding hospitalization.    Generalized malaise and myalgias similar to her recent admission for pyelonephritis. Repeat labs consistent with recurrent sepsis likely from UTI.   CBC with leukocytosis increase from discharge CBC. CMP with mild hyponatremia.  Mild hyperglycemia without DKA.  No renal insufficiency. UA appears improved but still concerning for UTI.  Culture sent to confirm Lactic of 2.2 Previous culture grew out Klebsiella pneumoniae sensitive to ceftriaxone.    Code sepsis initiated and patient started on ceftriaxone.  IV fluids given. Admitted to medicine for continued management.    Final Clinical Impression(s) / ED Diagnoses Final diagnoses:  Sepsis due to Klebsiella pneumoniae [facility])     This chart was dictated using voice recognition software.  Despite best efforts to proofread,  errors can occur which can change the documentation meaning.    Nira Conn, MD 01/13/23 [phone]

## 2023-01-13 NOTE — H&P (Signed)
History and Physical  [name] EXB:[id number] DOB: 22-May-1945 DOA: 01/13/2023  PCP: Charlane Ferretti, DO   Chief Complaint: fever   HPI: [name] is a 77 y.o. female with medical history significant for postpolio paralysis, diastolic CHF, obesity, chronic back pain, hypertension, well-controlled type 2 diabetes, who was recently discharged on 8/29 after admission for pyelonephritis and is being readmitted to the hospital for complaints of malaise and chills, found to have recurrent sepsis.  She was discharged from the hospital 8/29, did not receive her first dose of oral antibiotics until late on 8/30.  Since she has been home, she started feeling quite a bit of malaise yesterday, had some nausea, and subjective chills with rigors.  Says that her pain is stable, denies changes in bowel habits, states she has had a cough since she was last in the hospital.  No dyspnea, no pleuritic chest pain.  ED Course: On evaluation in the emergency department, here she has been afebrile, blood pressure has been little bit on the low side and she has been saturating well on room air.  Daughter is at the bedside states she has been coughing here this morning.  Lab work was done here this morning, shows WBC up to 15K, from 13.  Sodium 130, normal kidney function.  Lactate initially 2.2, improved to 2.0 after IV fluids.  Blood and urine cultures were obtained, she was started on IV Rocephin.  Review of Systems: Please see HPI for pertinent positives and negatives. A complete 10 system review of systems are otherwise negative.  Past Medical History:  Diagnosis Date   Angina pectoris (HCC) 08/02/2016   Anxiety    Arthritis    "hands, knees, back" (07/18/2013)   Asthma    CHF (congestive heart failure) (HCC)    Chronic back pain    Chronic lower back pain    Family history of anesthesia complication    Mother and Sister- N/V   GERD (gastroesophageal reflux disease)    H/O hiatal hernia     Headache(784.0)    "weekly" (07/18/2013)   Hypertension    Hypothyroidism    Insomnia    Kidney stones    Memory changes    Mental disorder    Migraine    "years ago" (07/18/2013)   Osteopenia    Post-polio syndrome    Pure hypercholesterolemia 02/07/2021   Sleep apnea    Takotsubo cardiomyopathy    Type II diabetes mellitus (HCC)    Ulcer of leg, chronic (HCC) 02/07/2021   Past Surgical History:  Procedure Laterality Date   ABDOMINAL HYSTERECTOMY     APPENDECTOMY     CARPAL TUNNEL RELEASE Bilateral    CHOLECYSTECTOMY     COLONOSCOPY     CYSTOSCOPY     IR INJECT/THERA/INC NEEDLE/CATH/PLC EPI/LUMB/SAC W/IMG  07/19/2022   IR INJECT/THERA/INC NEEDLE/CATH/PLC EPI/LUMB/SAC W/IMG  09/11/2022   IR INJECT/THERA/INC NEEDLE/CATH/PLC EPI/LUMB/SAC W/IMG  12/26/2022   KNEE ARTHROSCOPY Left    KNEE SURGERY Left    LEG SURGERY Left    polio   ORIF PATELLA Left 07/18/2013   Procedure: OPEN REDUCTION INTERNAL (ORIF) LEFT FIXATION PATELLA;  Surgeon: Verlee Rossetti, MD;  Location: [facility] OR;  Service: Orthopedics;  Laterality: Left;   ORIF PATELLA FRACTURE Left 07/18/2013   TUBAL LIGATION     WISDOM TOOTH EXTRACTION     WRIST TENDON TRANSFER Right     Social History:  reports that she has never smoked. She has never used smokeless tobacco. She  reports that she does not drink alcohol and does not use drugs.   Allergies  Allergen Reactions   Tamsulosin Other (See Comments)    Caused uncontrollable movements   Ciprofloxacin Rash   Carisoprodol Itching    Other Reaction(s): Unknown   Oxycodone-Acetaminophen     Other Reaction(s): ITCHING   Penicillins Itching    Pt has tolerated rocephin IV  Other Reaction(s): ITCHING   Sulfa Antibiotics Other (See Comments)    Unknown.  States as always told had an allergy  Other Reaction(s): Unknown   Acetaminophen Itching    Other Reaction(s): ITCHING   Aspirin Itching and Other (See Comments)    Chest pain, also   Macrodantin [Nitrofurantoin  Macrocrystal] Rash   Nsaids Other (See Comments)    Chest pain and stomach pain  Other Reaction(s): Unknown   Percocet [Oxycodone-Acetaminophen] Itching   Propoxyphene Other (See Comments)    Chest/stomach pain  Other Reaction(s): ITCHING   Propoxyphene N-Acetaminophen Other (See Comments)    Chest/stomach pain    Family History  Problem Relation Age of Onset   Heart failure Mother    Parkinson's disease Mother    Diabetes Father    Cancer Father    Heart failure Father    Breast cancer Maternal Grandmother    Allergies Grandchild      Prior to Admission medications   Medication Sig Start Date End Date Taking? Authorizing Provider  amitriptyline (ELAVIL) 25 MG tablet Take 25 mg by mouth at bedtime.   Yes [provider]  ascorbic acid (VITAMIN C) 500 MG tablet Take 500 mg by mouth daily.   Yes [provider]  atorvastatin (LIPITOR) 40 MG tablet Take 40 mg by mouth daily.   Yes [provider]  b complex vitamins tablet Take 1 tablet by mouth daily.   Yes [provider]  carvedilol (COREG) 25 MG tablet Take 25 mg by mouth 2 (two) times daily with a meal.   Yes [provider]  cefadroxil (DURICEF) 500 MG capsule Take 1 capsule (500 mg total) by mouth 2 (two) times daily for 2 days. 01/12/23 01/14/23 Yes Narda Bonds, MD  cholecalciferol (VITAMIN D3) 25 MCG (1000 UNIT) tablet Take 1,000 Units by mouth daily.   Yes [provider]  clonazePAM (KLONOPIN) 0.5 MG tablet Take 0.5 mg by mouth at bedtime.   Yes [provider]  gabapentin (NEURONTIN) 300 MG capsule Take 300 mg by mouth 2 (two) times daily.   Yes [provider]  hydrOXYzine (VISTARIL) 25 MG capsule Take 25 mg by mouth at bedtime. 12/31/22  Yes [provider]  Iron, Ferrous Sulfate, 325 (65 Fe) MG TABS Take 1 tablet by mouth daily.   Yes [provider]  levothyroxine (SYNTHROID, LEVOTHROID) 100 MCG tablet Take 100 mcg by mouth  daily before breakfast.   Yes [provider]  metFORMIN (GLUCOPHAGE) 500 MG tablet Take 500 mg by mouth 2 (two) times daily.   Yes [provider]  mirabegron ER (MYRBETRIQ) 50 MG TB24 tablet Take 50 mg by mouth daily.   Yes [provider]  Multiple Vitamins-Minerals (MULTIVITAMIN WITH MINERALS) tablet Take 1 tablet by mouth daily.   Yes [provider]  nitroGLYCERIN (NITROSTAT) 0.4 MG SL tablet Place 1 tablet (0.4 mg total) under the tongue every 5 (five) minutes as needed for chest pain. 12/07/22  Yes Alver Sorrow, NP  pantoprazole (PROTONIX) 40 MG tablet Take 1 tablet by mouth daily. Take 1 tab  daily 12/21/14  Yes [provider]  saccharomyces boulardii (FLORASTOR) 250 MG capsule Take 250 mg by mouth daily. 12/15/22  Yes [provider]  telmisartan (MICARDIS) 80 MG tablet Take 40 mg by mouth daily. Pt takes half of a tab daily 12/14/14  Yes [provider]  tiZANidine (ZANAFLEX) 4 MG tablet Take 4 mg by mouth at bedtime. PAIN   Yes [provider]  torsemide (DEMADEX) 5 MG tablet Take 5 mg by mouth daily.   Yes [provider]  Zinc 50 MG TABS Take 50 mg by mouth daily.   Yes [provider]  zolpidem (AMBIEN) 5 MG tablet Take 5 mg by mouth at bedtime as needed for sleep.   Yes [provider]  aspirin 81 MG tablet Take 81 mg by mouth at bedtime.  Patient not taking: Reported on 01/13/2023    [provider]  loratadine (CLARITIN) 10 MG tablet Take 10 mg by mouth daily as needed for allergies. 12/17/19   [provider]  vitamin E 180 MG (400 UNITS) capsule Take 400 Units by mouth daily.    [provider]    Physical Exam: BP (!) 118/55   Pulse (!) 47   Temp 97.7 F (36.5 C) (Oral)   Resp 17   Ht 5' (1.524 m)   Wt 107.5 kg   SpO2 93%   BMI 46.29 kg/m   General:  Alert, oriented, calm, in no acute distress, her daughter is at the bedside Eyes: EOMI, clear  conjuctivae, white sclerea Neck: supple, no masses, trachea mildline  Cardiovascular: RRR, no murmurs or rubs, no peripheral edema  Respiratory: clear to auscultation bilaterally, no wheezes, no crackles, resting comfortably on room air Abdomen: soft, nontender, nondistended, normal bowel tones heard  Skin: dry, no rashes  Musculoskeletal: no joint effusions, significant lower extremity weakness Psychiatric: appropriate affect, normal speech  Neurologic: extraocular muscles intact, clear speech, moving all extremities with intact sensorium          Labs on Admission:  Basic Metabolic Panel: Recent Labs  Lab 01/08/23 0507 01/09/23 0439 01/10/23 0450 01/10/23 2307 01/11/23 0913 01/13/23 0147  NA 134* 134* 137 134*  --  130*  K 3.5 3.6 3.6 3.3* 3.8 3.8  CL 100 102 103 100  --  96*  CO2 26 25 24 24   --  23  GLUCOSE 167* 177* 163* 213*  --  189*  BUN 9 8 7* 6*  --  9  CREATININE 0.46 0.42* <0.30* 0.36*  --  0.37*  CALCIUM 8.7* 8.1* 8.4* 8.2*  --  8.8*  MG  --   --   --  1.3*  --   --   PHOS  --   --   --  2.9  --   --    Liver Function Tests: Recent Labs  Lab 01/07/23 1750  AST 26  ALT 23  ALKPHOS 60  BILITOT 1.0  PROT 7.0  ALBUMIN 3.4*   Recent Labs  Lab 01/07/23 1750  LIPASE 20   No results for input(s): "AMMONIA" in the last 168 hours. CBC: Recent Labs  Lab 01/07/23 1750 01/08/23 0507 01/09/23 0439 01/10/23 0450 01/11/23 0444 01/13/23 0147  WBC 17.8* 16.2* 12.2* 12.4* 12.7* 14.6*  NEUTROABS 11.0* 10.1*  --   --   --  9.2*  HGB 12.8 12.5 11.4* 11.2* 11.1* 11.2*  HCT 39.5 39.4 35.7* 34.3* 33.6* 33.8*  MCV 94.3 94.5 94.4 93.7 92.8 91.1  PLT 408*  380 352 389 386 399   Cardiac Enzymes: No results for input(s): "CKTOTAL", "CKMB", "CKMBINDEX", "TROPONINI" in the last 168 hours.  BNP (last 3 results) Recent Labs    10/27/22 0544 11/11/22 0206 01/07/23 2256  BNP 49.7 40.4 101.2*    ProBNP (last 3 results) No results for input(s): "PROBNP" in the last  8760 hours.  CBG: No results for input(s): "GLUCAP" in the last 168 hours.  Radiological Exams on Admission: DG Shoulder Left Port  Result Date: 01/11/2023 CLINICAL DATA:  Left shoulder pain. EXAM: LEFT SHOULDER COMPARISON:  None Available. FINDINGS: There is no evidence of fracture or dislocation. Mild osteophyte formation is seen involving the left glenohumeral joint. Widening of glenohumeral joint is noted suggesting ligamentous instability. Soft tissues are unremarkable. IMPRESSION: Mild degenerative joint disease of left glenohumeral joint is noted. Some widening of the joint space is noted as well suggesting ligamentous laxity. No fracture is noted. Electronically Signed   By: Lupita Raider M.D.   On: 01/11/2023 14:29    Assessment/Plan [name] is a 77 y.o. female with medical history significant for postpolio paralysis, diastolic CHF, obesity, chronic back pain, hypertension, well-controlled type 2 diabetes, who was recently discharged on 8/29 after admission for pyelonephritis and is being readmitted to the hospital for complaints of malaise and chills, found to have recurrent sepsis.   Sepsis-meeting criteria with leukocytosis, hypotension, lactate 2.2.  Source is likely her known UTI, she essentially went about 36 hours without antibiotics.  She is complaining of a cough, but no significant dyspnea. -Inpatient admission -Follow blood and urine cultures -She is hemodynamically stable, continue IV fluids -Trend lactate, which is improving -Check chest x-ray due to cough  UTI-presumed source of her sepsis, given recent culture and partially treated infection -IV Ancef -Anticipate transition to oral antibiotic in the next 24 hours  Leukocytosis-slightly worsened, likely due to UTI as above  Diastolic CHF-patient appears euvolemic, continue Coreg, Lipitor, Demadex  Hypothyroidism-Synthroid  Iron deficiency anemia-continue ferrous sulfate  Hypertension-blood pressure on  the low side, will hold her ARB today  Chronic pain-continue gabapentin, Zanaflex, Elavil  DVT prophylaxis: Lovenox     Code Status: Full Code  Consults called: None  Admission status: The appropriate patient status for this patient is INPATIENT. Inpatient status is judged to be reasonable and necessary in order to provide the required intensity of service to ensure the patient's safety. The patient's presenting symptoms, physical exam findings, and initial radiographic and laboratory data in the context of their chronic comorbidities is felt to place them at high risk for further clinical deterioration. Furthermore, it is not anticipated that the patient will be medically stable for discharge from the hospital within 2 midnights of admission.    I certify that at the point of admission it is my clinical judgment that the patient will require inpatient hospital care spanning beyond 2 midnights from the point of admission due to high intensity of service, high risk for further deterioration and high frequency of surveillance required   Time spent: 59 minutes  [name] Sharlette Dense MD [facility] Pager [phone]  If 7PM-7AM, please contact night-coverage [email or web address] Password [email or web address]  01/13/2023, 8:00 AM

## 2023-01-14 ENCOUNTER — Inpatient Hospital Stay (HOSPITAL_COMMUNITY): Payer: Medicare Other

## 2023-01-14 DIAGNOSIS — N390 Urinary tract infection, site not specified: Secondary | ICD-10-CM

## 2023-01-14 DIAGNOSIS — A419 Sepsis, unspecified organism: Secondary | ICD-10-CM | POA: Diagnosis not present

## 2023-01-14 DIAGNOSIS — M79642 Pain in left hand: Secondary | ICD-10-CM | POA: Diagnosis not present

## 2023-01-14 LAB — CBC
HCT: 32.9 % — ABNORMAL LOW (ref 36.0–46.0)
Hemoglobin: 10.4 g/dL — ABNORMAL LOW (ref 12.0–15.0)
MCH: 29.8 pg (ref 26.0–34.0)
MCHC: 31.6 g/dL (ref 30.0–36.0)
MCV: 94.3 fL (ref 80.0–100.0)
Platelets: 364 10*3/uL (ref 150–400)
RBC: 3.49 MIL/uL — ABNORMAL LOW (ref 3.87–5.11)
RDW: 14.1 % (ref 11.5–15.5)
WBC: 13.1 10*3/uL — ABNORMAL HIGH (ref 4.0–10.5)
nRBC: 0 % (ref 0.0–0.2)

## 2023-01-14 LAB — BASIC METABOLIC PANEL
Anion gap: 11 (ref 5–15)
BUN: 8 mg/dL (ref 8–23)
CO2: 23 mmol/L (ref 22–32)
Calcium: 8.1 mg/dL — ABNORMAL LOW (ref 8.9–10.3)
Chloride: 100 mmol/L (ref 98–111)
Creatinine, Ser: 0.42 mg/dL — ABNORMAL LOW (ref 0.44–1.00)
GFR, Estimated: >60 mL/min (ref >60)
Glucose, Bld: 177 mg/dL — ABNORMAL HIGH (ref 70–99)
Potassium: 3.6 mmol/L (ref 3.5–5.1)
Sodium: 134 mmol/L — ABNORMAL LOW (ref 135–145)

## 2023-01-14 LAB — GLUCOSE, CAPILLARY
Glucose-Capillary: 137 mg/dL — ABNORMAL HIGH (ref 70–99)
Glucose-Capillary: 152 mg/dL — ABNORMAL HIGH (ref 70–99)
Glucose-Capillary: 144 mg/dL — ABNORMAL HIGH (ref 70–99)
Glucose-Capillary: 207 mg/dL — ABNORMAL HIGH (ref 70–99)

## 2023-01-14 LAB — URINE CULTURE: Culture: NO GROWTH

## 2023-01-14 MED ORDER — ACETAMINOPHEN 325 MG PO TABS
650.0000 mg | ORAL_TABLET | Freq: Four times a day (QID) | ORAL | Status: DC | PRN
  Administered 2023-01-14 – 2023-01-16 (×5): 650 mg via ORAL
  Filled 2023-01-14 (×6): qty 2

## 2023-01-14 MED ORDER — ORAL CARE MOUTH RINSE: 15.0000 mL | OROMUCOSAL | Status: DC | PRN

## 2023-01-14 NOTE — Plan of Care (Signed)
  Problem: Safety: Goal: Ability to remain free from injury will improve Outcome: Progressing   Problem: Skin Integrity: Goal: Risk for impaired skin integrity will decrease Outcome: Progressing   Problem: Pain Managment: Goal: General experience of comfort will improve Outcome: Progressing   Problem: Coping: Goal: Level of anxiety will decrease Outcome: Progressing   Problem: Clinical Measurements: Goal: Ability to maintain clinical measurements within normal limits will improve Outcome: Progressing   

## 2023-01-14 NOTE — Progress Notes (Signed)
OT Cancellation Note  Patient Details Name: [name] MRN: [id number] DOB: 1945-05-23   Cancelled Treatment:    Reason Eval/Treat Not Completed: Other (comment) Patient is in bed with husband present during this session. Patient reported having more difficulty with transfers at home with pain and decreased movement in BUE. Patient lethargic at this time falling asleep during session but was noted during last hospitalization to have had decreased ROM of right shoulder for unknown period of time prior to that hospitalization. OT to continue to follow and check back when patient is less lethargic.  Rosalio Loud, MS Acute Rehabilitation Department Office# [phone]  01/14/2023, 10:00 AM

## 2023-01-14 NOTE — Progress Notes (Signed)
Seven sliver colored rings removed from left hand due to swelling. Rings given to patients husband at bedside, husband stated that he was going to take the rings home today.   Left hand elevated and ice packs applied. IV fluids stopped per MD verbal order.

## 2023-01-14 NOTE — Progress Notes (Signed)
PROGRESS NOTE    [name]  VFI:[id number] DOB: [birth year] DOA: 01/13/2023  PCP: Charlane Ferretti, DO   Brief Narrative:  This 77 yrs old female with PMH significant for postpolio paralysis, diastolic CHF, obesity, chronic back pain, hypertension, well-controlled type 2 diabetes, who was recently discharged on 8/29 after admission for pyelonephritis and is being readmitted to the hospital for complaints of malaise and chills, found to have recurrent sepsis.  Patient was discharged on oral antibiotics but she has not taken oral antibiotics for more than 24 hours after discharge.  She started feeling malaise weak and tired and reports subjective chills with rigors.  ED workup reveals blood pressure is on the soft side.  Lab work shows WBC 15K, lactate 2.2 which improved with IV hydration. Patient started on IV antibiotics,  admitted for further evaluation. blood and urine cultures were sent.  Assessment & Plan:   Principal Problem:   Sepsis secondary to UTI (HCC)  Sepsis secondary to UTI: Patient presented with leukocytosis, hypotension, lactic acid 2.2, source of infection. She was discharged home on oral antibiotics but she has not taken antibiotics for 36 hours after discharge.   Now she has developed cough but no shortness of breath. UA consistent with UTI, patient is hemodynamically stable. Follow-up urine and blood culture. Continue empiric antibiotics  IV Ancef Continue IV fluid resuscitation. Lactic acid improving with IV hydration.  Urinary tract infection :  As presumed source of sepsis, given recent culture and partially treated infection. Continue IV Ancef Anticipate transition to oral antibiotics in the next 24 hours.   Leukocytosis sec to UTI: slightly worsened, likely due to UTI as above   Diastolic CHF: Patient appears euvolemic,  Continue Coreg, Lipitor, Demadex   Hypothyroidism: Continue Synthroid   Iron deficiency anemia: Continue ferrous sulfate    Hypertension; blood pressure on the low side, will hold her ARB today   Chronic pain: Continue gabapentin, Zanaflex, Elavil   Left hand swelling: Start warm compresses, and elevation.  DVT prophylaxis: Lovenox Code Status:Full code Family Communication: Husband at bed side. Disposition Plan:     Status is: Inpatient Remains inpatient appropriate because: Admitted for sepsis secondary to UTI.    Consultants:  None  Procedures: None  Antimicrobials:  Anti-infectives (From admission, onward)    Start     Dose/Rate Route Frequency Ordered Stop   01/14/23 0300  ceFAZolin (ANCEF) IVPB 1 g/50 mL premix        1 g 100 mL/hr over 30 Minutes Intravenous Every 8 hours 01/13/23 0455     01/13/23 0330  cefTRIAXone (ROCEPHIN) 2 g in sodium chloride 0.9 % 100 mL IVPB        2 g 200 mL/hr over 30 Minutes Intravenous  Once 01/13/23 0318 01/13/23 0410      Subjective: Patient was seen and examined at bedside.  Overnight events noted.   Patient reports  doing better. She denies any burning urination.  Objective: Vitals:   01/14/23 0006 01/14/23 0417 01/14/23 0932 01/14/23 1235  BP: (!) 160/72 (!) 140/57 (!) 140/61 (!) 152/67  Pulse: 83 68 66 (!) 57  Resp: 18 16  20   Temp: 98.7 F (37.1 C) 99.3 F (37.4 C)  98.2 F (36.8 C)  TempSrc: Oral Oral  Oral  SpO2: 96% 94% 94% 98%  Weight:      Height:        Intake/Output Summary (Last 24 hours) at 01/14/2023 1253 Last data filed at 01/14/2023 1148 Gross per 24 hour  Intake 3319.07 ml  Output 2851 ml  Net 468.07 ml   Filed Weights   01/13/23 0037 01/13/23 1300  Weight: 107.5 kg 99.7 kg    Examination:  General exam: Appears calm and comfortable, deconditioned, not in any acute distress. Respiratory system: CTA bilaterally. Respiratory effort normal. RR 16 Cardiovascular system: S1 & S2 heard, regular rate and rhythm, no murmur.  No pedal edema. Gastrointestinal system: Abdomen is nondistended, soft and nontender.  Normal  bowel sounds heard. Central nervous system: Alert and oriented x 3. No focal neurological deficits. Extremities: No edema, no cyanosis, no clubbing Skin: No rashes, lesions or ulcers Psychiatry: Judgement and insight appear normal. Mood & affect appropriate.     Data Reviewed: I have personally reviewed following labs and imaging studies  CBC: Recent Labs  Lab 01/07/23 1750 01/08/23 0507 01/09/23 0439 01/10/23 0450 01/11/23 0444 01/13/23 0147 01/14/23 0356  WBC 17.8* 16.2* 12.2* 12.4* 12.7* 14.6* 13.1*  NEUTROABS 11.0* 10.1*  --   --   --  9.2*  --   HGB 12.8 12.5 11.4* 11.2* 11.1* 11.2* 10.4*  HCT 39.5 39.4 35.7* 34.3* 33.6* 33.8* 32.9*  MCV 94.3 94.5 94.4 93.7 92.8 91.1 94.3  PLT 408* 380 352 389 386 399 364   Basic Metabolic Panel: Recent Labs  Lab 01/09/23 0439 01/10/23 0450 01/10/23 2307 01/11/23 0913 01/13/23 0147 01/14/23 0356  NA 134* 137 134*  --  130* 134*  K 3.6 3.6 3.3* 3.8 3.8 3.6  CL 102 103 100  --  96* 100  CO2 25 24 24   --  23 23  GLUCOSE 177* 163* 213*  --  189* 177*  BUN 8 7* 6*  --  9 8  CREATININE 0.42* <0.30* 0.36*  --  0.37* 0.42*  CALCIUM 8.1* 8.4* 8.2*  --  8.8* 8.1*  MG  --   --  1.3*  --   --   --   PHOS  --   --  2.9  --   --   --    GFR: Estimated Creatinine Clearance: 60.5 mL/min (A) (by C-G formula based on SCr of 0.42 mg/dL (L)). Liver Function Tests: Recent Labs  Lab 01/07/23 1750  AST 26  ALT 23  ALKPHOS 60  BILITOT 1.0  PROT 7.0  ALBUMIN 3.4*   Recent Labs  Lab 01/07/23 1750  LIPASE 20   No results for input(s): "AMMONIA" in the last 168 hours. Coagulation Profile: No results for input(s): "INR", "PROTIME" in the last 168 hours. Cardiac Enzymes: No results for input(s): "CKTOTAL", "CKMB", "CKMBINDEX", "TROPONINI" in the last 168 hours. BNP (last 3 results) No results for input(s): "PROBNP" in the last 8760 hours. HbA1C: No results for input(s): "HGBA1C" in the last 72 hours. CBG: Recent Labs  Lab  01/13/23 1340 01/13/23 1720 01/13/23 2145 01/14/23 0750 01/14/23 1244  GLUCAP 129* 133* 160* 137* 152*   Lipid Profile: No results for input(s): "CHOL", "HDL", "LDLCALC", "TRIG", "CHOLHDL", "LDLDIRECT" in the last 72 hours. Thyroid Function Tests: No results for input(s): "TSH", "T4TOTAL", "FREET4", "T3FREE", "THYROIDAB" in the last 72 hours. Anemia Panel: No results for input(s): "VITAMINB12", "FOLATE", "FERRITIN", "TIBC", "IRON", "RETICCTPCT" in the last 72 hours. Sepsis Labs: Recent Labs  Lab 01/08/23 2100 01/09/23 1202 01/13/23 0207 01/13/23 0453  LATICACIDVEN 2.2* 1.6 2.2* 2.0*    Recent Results (from the past 240 hour(s))  Blood culture (routine x 2)     Status: None   Collection Time: 01/07/23  4:49 PM  Specimen: BLOOD RIGHT FOREARM  Result Value Ref Range Status   Specimen Description   Final    BLOOD RIGHT FOREARM Performed at [facility], [address], [place], [place] [address]    Special Requests   Final    BOTTLES DRAWN AEROBIC ONLY Blood Culture adequate volume Performed at [facility], [address], [place], [place] [address]    Culture   Final    NO GROWTH 5 DAYS Performed at [facility], [address] [place], [place] [address]    Report Status 01/13/2023 FINAL  Final  Urine Culture     Status: Abnormal   Collection Time: 01/07/23  5:18 PM   Specimen: Urine, Random  Result Value Ref Range Status   Specimen Description   Final    URINE, RANDOM Performed at [facility], [address], [place], [place] [address]    Special Requests   Final    NONE Reflexed from [id number] Performed at [facility], [address], [place], [place] [address]    Culture >=100,000 COLONIES/mL KLEBSIELLA PNEUMONIAE (A)  Final   Report Status 01/09/2023 FINAL  Final   Organism ID, Bacteria KLEBSIELLA PNEUMONIAE (A)  Final      Susceptibility   Klebsiella pneumoniae - MIC*     AMPICILLIN RESISTANT Resistant     CEFAZOLIN <=4 SENSITIVE Sensitive     CEFEPIME <=0.12 SENSITIVE Sensitive     CEFTRIAXONE <=0.25 SENSITIVE Sensitive     CIPROFLOXACIN <=0.25 SENSITIVE Sensitive     GENTAMICIN <=1 SENSITIVE Sensitive     IMIPENEM <=0.25 SENSITIVE Sensitive     NITROFURANTOIN 64 INTERMEDIATE Intermediate     TRIMETH/SULFA <=20 SENSITIVE Sensitive     AMPICILLIN/SULBACTAM 4 SENSITIVE Sensitive     PIP/TAZO <=4 SENSITIVE Sensitive     * >=100,000 COLONIES/mL KLEBSIELLA PNEUMONIAE  Blood culture (routine x 2)     Status: None   Collection Time: 01/07/23  5:40 PM   Specimen: BLOOD  Result Value Ref Range Status   Specimen Description   Final    BLOOD LEFT ANTECUBITAL Performed at [facility], [address] [place], [place] [address]    Special Requests   Final    BOTTLES DRAWN AEROBIC AND ANAEROBIC Blood Culture adequate volume Performed at [facility], [address], [place], [place] [address]    Culture   Final    NO GROWTH 5 DAYS Performed at [facility], [address] [place], [place] [address]    Report Status 01/13/2023 FINAL  Final  Urine Culture     Status: None   Collection Time: 01/13/23  2:02 AM   Specimen: Urine, Random  Result Value Ref Range Status   Specimen Description   Final    URINE, RANDOM Performed at [facility], [address], [place], [place] [address]    Special Requests   Final    NONE Reflexed from [id number] Performed at [facility], [address], [place], [place] [address]    Culture   Final    NO GROWTH Performed at [facility], [address], [place], [place] [address]    Report Status 01/14/2023 FINAL  Final  Blood culture (routine x 2)     Status: None (Preliminary result)   Collection Time: 01/13/23  3:24 AM   Specimen: BLOOD RIGHT HAND  Result Value Ref Range Status   Specimen Description  BLOOD RIGHT HAND  Final   Special Requests    Final    BOTTLES DRAWN AEROBIC AND ANAEROBIC Blood Culture adequate volume   Culture   Final    NO GROWTH 1 DAY Performed at [facility], [address], [place], [place] [address]    Report Status PENDING  Incomplete  Blood culture (routine x 2)     Status: None (Preliminary result)   Collection Time: 01/13/23  3:39 AM   Specimen: BLOOD LEFT HAND  Result Value Ref Range Status   Specimen Description BLOOD LEFT HAND  Final   Special Requests   Final    BOTTLES DRAWN AEROBIC AND ANAEROBIC Blood Culture adequate volume   Culture   Final    NO GROWTH 1 DAY Performed at [facility], [address] [place], [place] [address]    Report Status PENDING  Incomplete   Radiology Studies: DG CHEST PORT 1 VIEW  Result Date: 01/13/2023 CLINICAL DATA:  77 year old female with a history of post-polio paralysis. Recent hospitalization for pyelonephritis. Malaise, chills. EXAM: PORTABLE CHEST 1 VIEW COMPARISON:  Portable chest 01/07/2023 and earlier. FINDINGS: Portable AP semi upright view at 0829 hours. More patient rotation to the left, similar somewhat low lung volumes. Mediastinal contours are stable and within normal limits. Visualized tracheal air column is within normal limits. Allowing for portable technique the lungs are clear. No pneumothorax. No acute osseous abnormality identified. IMPRESSION: More rotated to the left.  No acute cardiopulmonary abnormality. Electronically Signed   By: Odessa Fleming M.D.   On: 01/13/2023 08:59    Scheduled Meds:  amitriptyline  25 mg Oral QHS   atorvastatin  40 mg Oral Daily   carvedilol  25 mg Oral BID WC   clonazePAM  0.5 mg Oral QHS   enoxaparin (LOVENOX) injection  40 mg Subcutaneous Q24H   ferrous sulfate  325 mg Oral Q breakfast   gabapentin  300 mg Oral BID   hydrOXYzine  25 mg Oral QHS   insulin aspart  0-15 Units Subcutaneous TID WC   insulin aspart  0-5 Units Subcutaneous QHS   irbesartan  150 mg Oral Daily   levothyroxine  100 mcg  Oral QAC breakfast   mirabegron ER  50 mg Oral Daily   pantoprazole  40 mg Oral Daily   saccharomyces boulardii  250 mg Oral Daily   tiZANidine  4 mg Oral QHS   torsemide  5 mg Oral Daily   Continuous Infusions:  sodium chloride Stopped (01/14/23 0940)    ceFAZolin (ANCEF) IV 1 g (01/14/23 1033)     LOS: 1 day    Time spent: 50 mins    Willeen Niece, MD [facility]   If 7PM-7AM, please contact night-coverage

## 2023-01-14 NOTE — Progress Notes (Signed)
Upper extremity venous duplex completed. Please see CV Procedures for preliminary results.  Shona Simpson, RVT 01/14/23 4:52 PM

## 2023-01-15 DIAGNOSIS — A419 Sepsis, unspecified organism: Secondary | ICD-10-CM | POA: Diagnosis not present

## 2023-01-15 DIAGNOSIS — N390 Urinary tract infection, site not specified: Secondary | ICD-10-CM | POA: Diagnosis not present

## 2023-01-15 LAB — BASIC METABOLIC PANEL
Anion gap: 8 (ref 5–15)
BUN: 8 mg/dL (ref 8–23)
CO2: 25 mmol/L (ref 22–32)
Calcium: 8.2 mg/dL — ABNORMAL LOW (ref 8.9–10.3)
Chloride: 96 mmol/L — ABNORMAL LOW (ref 98–111)
Creatinine, Ser: 0.41 mg/dL — ABNORMAL LOW (ref 0.44–1.00)
GFR, Estimated: >60 mL/min (ref >60)
Glucose, Bld: 150 mg/dL — ABNORMAL HIGH (ref 70–99)
Potassium: 3.5 mmol/L (ref 3.5–5.1)
Sodium: 129 mmol/L — ABNORMAL LOW (ref 135–145)

## 2023-01-15 LAB — GLUCOSE, CAPILLARY
Glucose-Capillary: 132 mg/dL — ABNORMAL HIGH (ref 70–99)
Glucose-Capillary: 172 mg/dL — ABNORMAL HIGH (ref 70–99)
Glucose-Capillary: 186 mg/dL — ABNORMAL HIGH (ref 70–99)
Glucose-Capillary: 183 mg/dL — ABNORMAL HIGH (ref 70–99)

## 2023-01-15 LAB — CBC
HCT: 32.1 % — ABNORMAL LOW (ref 36.0–46.0)
Hemoglobin: 10.3 g/dL — ABNORMAL LOW (ref 12.0–15.0)
MCH: 28.9 pg (ref 26.0–34.0)
MCHC: 32.1 g/dL (ref 30.0–36.0)
MCV: 89.9 fL (ref 80.0–100.0)
Platelets: 383 10*3/uL (ref 150–400)
RBC: 3.57 MIL/uL — ABNORMAL LOW (ref 3.87–5.11)
RDW: 14 % (ref 11.5–15.5)
WBC: 13 10*3/uL — ABNORMAL HIGH (ref 4.0–10.5)
nRBC: 0 % (ref 0.0–0.2)

## 2023-01-15 LAB — PHOSPHORUS: Phosphorus: 3.8 mg/dL (ref 2.5–4.6)

## 2023-01-15 LAB — MAGNESIUM: Magnesium: 1 mg/dL — ABNORMAL LOW (ref 1.7–2.4)

## 2023-01-15 MED ORDER — MAGNESIUM SULFATE 2 GM/50ML IV SOLN
2.0000 g | Freq: Once | INTRAVENOUS | Status: AC
  Administered 2023-01-15: 2 g via INTRAVENOUS
  Filled 2023-01-15: qty 50

## 2023-01-15 NOTE — TOC CM/SW Note (Signed)
Transition of Care [facility]) - Inpatient Brief Assessment   Patient Details  Name: [name] MRN: [id number] Date of Birth: 05-23-1945  Transition [facility]) CM/SW Contact:    Princella Ion, LCSW Phone Number: 01/15/2023, 10:14 AM   Clinical Narrative: Pt from home and [facility] bound at baseline. Spouse provides support at home. Pt has DME at home already. Pt and family utilizes [facility] transport [employer] for transportation assistance. No current TOC needs. Please place [facility] consult if needs arise.    Transition of Care Asessment: Insurance and Status: Insurance coverage has been reviewed Patient has primary care physician: Yes Home environment has been reviewed: yes Prior level of function:: wheelchair bound Prior/Current Home Services: No current home services Social Determinants of Health Reivew: SDOH reviewed no interventions necessary Readmission risk has been reviewed: Yes Transition of care needs: no transition of care needs at this time

## 2023-01-15 NOTE — Plan of Care (Signed)
  Problem: Safety: Goal: Ability to remain free from injury will improve Outcome: Progressing   Problem: Skin Integrity: Goal: Risk for impaired skin integrity will decrease Outcome: Progressing   Problem: Pain Managment: Goal: General experience of comfort will improve Outcome: Progressing   Problem: Coping: Goal: Level of anxiety will decrease Outcome: Progressing   Problem: Clinical Measurements: Goal: Ability to maintain clinical measurements within normal limits will improve Outcome: Progressing   

## 2023-01-15 NOTE — Progress Notes (Signed)
PROGRESS NOTE    NICHA ZORA  ZOX:[id number] DOB: 1945-07-08 DOA: 01/13/2023  PCP: Charlane Ferretti, DO   Brief Narrative:  This 77 yrs old female with PMH significant for postpolio paralysis, diastolic CHF, obesity, chronic back pain, hypertension, well-controlled type 2 diabetes, who was recently discharged on 8/29 after admission for pyelonephritis and is being readmitted to the hospital for complaints of malaise and chills, found to have recurrent sepsis.  Patient was discharged on oral antibiotics but she has not taken oral antibiotics for more than 24 hours after discharge.  She started feeling malaise weak and tired and reports subjective chills with rigors.  ED workup reveals blood pressure is on the soft side.  Lab work shows WBC 15K, lactate 2.2 which improved with IV hydration. Patient started on IV antibiotics,  admitted for further evaluation. blood and urine cultures were sent.  Assessment & Plan:   Principal Problem:   Sepsis secondary to UTI (HCC)  Sepsis secondary to UTI: Patient presented with leukocytosis, hypotension, lactic acid 2.2, source of infection. She was discharged home on oral antibiotics but she has not taken antibiotics for 36 hours after discharge.   Now she has developed cough but no shortness of breath. UA consistent with UTI, patient is hemodynamically stable. Blood and urine cultures no growth so far. Continue empiric antibiotics  IV Ancef Continue IV fluid resuscitation. Lactic acid improving with IV hydration.  Urinary tract infection ::  As presumed source of sepsis, given recent culture and partially treated infection. Continue IV Ancef Anticipate transition to oral antibiotics in the next 24 hours.   Leukocytosis sec to UTI: slightly worsened, likely due to UTI as above   Diastolic CHF: Patient appears euvolemic,  Continue Coreg, Lipitor, Demadex   Hypothyroidism: Continue Synthroid.   Iron deficiency anemia: Continue ferrous  sulfate.   Hypertension; Continue irbesartan daily   Chronic pain: Continue gabapentin, Zanaflex, Elavil.   Left hand swelling: Start warm compresses, and elevation. Left upper extremity venous duplex negative for DVT  Hypomagnesemia: Replaced.  Continue to monitor  DVT prophylaxis: Lovenox Code Status:Full code Family Communication: Husband at bed side. Disposition Plan:     Status is: Inpatient Remains inpatient appropriate because: Admitted for sepsis secondary to UTI, blood and urine cultures no growth so far.  Has developed left arm swelling secondary to IV infiltration..    Consultants:  None  Procedures: None  Antimicrobials:  Anti-infectives (From admission, onward)    Start     Dose/Rate Route Frequency Ordered Stop   01/14/23 0300  ceFAZolin (ANCEF) IVPB 1 g/50 mL premix        1 g 100 mL/hr over 30 Minutes Intravenous Every 8 hours 01/13/23 0455     01/13/23 0330  cefTRIAXone (ROCEPHIN) 2 g in sodium chloride 0.9 % 100 mL IVPB        2 g 200 mL/hr over 30 Minutes Intravenous  Once 01/13/23 0318 01/13/23 0410      Subjective: Patient was seen and examined at bedside. Overnight events noted.   Patient reports doing better. She denies any burning urination.    Objective: Vitals:   01/14/23 2047 01/15/23 0047 01/15/23 0520 01/15/23 0918  BP: (!) 158/82 (!) 122/55 113/62 (!) 133/52  Pulse: 86 73 63 80  Resp: 19 18 18 20   Temp: (!) 100.4 F (38 C) 99.3 F (37.4 C) 99.2 F (37.3 C) 98.4 F (36.9 C)  TempSrc: Oral Oral Oral Oral  SpO2: 96% 99% 93% 98%  Weight:  Height:        Intake/Output Summary (Last 24 hours) at 01/15/2023 1220 Last data filed at 01/15/2023 4010 Gross per 24 hour  Intake 440 ml  Output 1525 ml  Net -1085 ml   Filed Weights   01/13/23 0037 01/13/23 1300  Weight: 107.5 kg 99.7 kg    Examination:  General exam: Appears calm and comfortable, deconditioned, not in any acute distress. Respiratory system: CTA bilaterally.  Respiratory effort normal. RR 14 Cardiovascular system: S1 & S2 heard, regular rate and rhythm, no murmur.  No pedal edema. Gastrointestinal system: Abdomen is non distended, soft and non tender.  Normal bowel sounds heard. Central nervous system: Alert and oriented x 3. No focal neurological deficits. Extremities: No edema, no cyanosis, no clubbing Skin: No rashes, lesions or ulcers Psychiatry: Judgement and insight appear normal. Mood & affect appropriate.     Data Reviewed: I have personally reviewed following labs and imaging studies  CBC: Recent Labs  Lab 01/10/23 0450 01/11/23 0444 01/13/23 0147 01/14/23 0356 01/15/23 0729  WBC 12.4* 12.7* 14.6* 13.1* 13.0*  NEUTROABS  --   --  9.2*  --   --   HGB 11.2* 11.1* 11.2* 10.4* 10.3*  HCT 34.3* 33.6* 33.8* 32.9* 32.1*  MCV 93.7 92.8 91.1 94.3 89.9  PLT 389 386 399 364 383   Basic Metabolic Panel: Recent Labs  Lab 01/10/23 0450 01/10/23 2307 01/11/23 0913 01/13/23 0147 01/14/23 0356 01/15/23 0729  NA 137 134*  --  130* 134* 129*  K 3.6 3.3* 3.8 3.8 3.6 3.5  CL 103 100  --  96* 100 96*  CO2 24 24  --  23 23 25   GLUCOSE 163* 213*  --  189* 177* 150*  BUN 7* 6*  --  9 8 8   CREATININE <0.30* 0.36*  --  0.37* 0.42* 0.41*  CALCIUM 8.4* 8.2*  --  8.8* 8.1* 8.2*  MG  --  1.3*  --   --   --  1.0*  PHOS  --  2.9  --   --   --  3.8   GFR: Estimated Creatinine Clearance: 60.5 mL/min (A) (by C-G formula based on SCr of 0.41 mg/dL (L)). Liver Function Tests: No results for input(s): "AST", "ALT", "ALKPHOS", "BILITOT", "PROT", "ALBUMIN" in the last 168 hours.  No results for input(s): "LIPASE", "AMYLASE" in the last 168 hours.  No results for input(s): "AMMONIA" in the last 168 hours. Coagulation Profile: No results for input(s): "INR", "PROTIME" in the last 168 hours. Cardiac Enzymes: No results for input(s): "CKTOTAL", "CKMB", "CKMBINDEX", "TROPONINI" in the last 168 hours. BNP (last 3 results) No results for input(s):  "PROBNP" in the last 8760 hours. HbA1C: No results for input(s): "HGBA1C" in the last 72 hours. CBG: Recent Labs  Lab 01/14/23 1244 01/14/23 1655 01/14/23 2046 01/15/23 0729 01/15/23 1154  GLUCAP 152* 144* 207* 132* 172*   Lipid Profile: No results for input(s): "CHOL", "HDL", "LDLCALC", "TRIG", "CHOLHDL", "LDLDIRECT" in the last 72 hours. Thyroid Function Tests: No results for input(s): "TSH", "T4TOTAL", "FREET4", "T3FREE", "THYROIDAB" in the last 72 hours. Anemia Panel: No results for input(s): "VITAMINB12", "FOLATE", "FERRITIN", "TIBC", "IRON", "RETICCTPCT" in the last 72 hours. Sepsis Labs: Recent Labs  Lab 01/08/23 2100 01/09/23 1202 01/13/23 0207 01/13/23 0453  LATICACIDVEN 2.2* 1.6 2.2* 2.0*    Recent Results (from the past 240 hour(s))  Blood culture (routine x 2)     Status: None   Collection Time: 01/07/23  4:49 PM  Specimen: BLOOD RIGHT FOREARM  Result Value Ref Range Status   Specimen Description   Final    BLOOD RIGHT FOREARM Performed at [facility], [address], [place], [place] [address]    Special Requests   Final    BOTTLES DRAWN AEROBIC ONLY Blood Culture adequate volume Performed at [facility], [address], [place], [place] [address]    Culture   Final    NO GROWTH 5 DAYS Performed at [facility], [address], [place], [place] [address]    Report Status 01/13/2023 FINAL  Final  Urine Culture     Status: Abnormal   Collection Time: 01/07/23  5:18 PM   Specimen: Urine, Random  Result Value Ref Range Status   Specimen Description   Final    URINE, RANDOM Performed at [facility], [address], [place], [place] [address]    Special Requests   Final    NONE Reflexed from [id number] Performed at [facility], [address], [place], [place] [address]    Culture >=100,000 COLONIES/mL KLEBSIELLA PNEUMONIAE (A)  Final   Report Status 01/09/2023 FINAL   Final   Organism ID, Bacteria KLEBSIELLA PNEUMONIAE (A)  Final      Susceptibility   Klebsiella pneumoniae - MIC*    AMPICILLIN RESISTANT Resistant     CEFAZOLIN <=4 SENSITIVE Sensitive     CEFEPIME <=0.12 SENSITIVE Sensitive     CEFTRIAXONE <=0.25 SENSITIVE Sensitive     CIPROFLOXACIN <=0.25 SENSITIVE Sensitive     GENTAMICIN <=1 SENSITIVE Sensitive     IMIPENEM <=0.25 SENSITIVE Sensitive     NITROFURANTOIN 64 INTERMEDIATE Intermediate     TRIMETH/SULFA <=20 SENSITIVE Sensitive     AMPICILLIN/SULBACTAM 4 SENSITIVE Sensitive     PIP/TAZO <=4 SENSITIVE Sensitive     * >=100,000 COLONIES/mL KLEBSIELLA PNEUMONIAE  Blood culture (routine x 2)     Status: None   Collection Time: 01/07/23  5:40 PM   Specimen: BLOOD  Result Value Ref Range Status   Specimen Description   Final    BLOOD LEFT ANTECUBITAL Performed at [facility], [address], [place], [place] [address]    Special Requests   Final    BOTTLES DRAWN AEROBIC AND ANAEROBIC Blood Culture adequate volume Performed at [facility], [address], [place], [place] [address]    Culture   Final    NO GROWTH 5 DAYS Performed at [facility], [address] [place], [place] [address]    Report Status 01/13/2023 FINAL  Final  Urine Culture     Status: None   Collection Time: 01/13/23  2:02 AM   Specimen: Urine, Random  Result Value Ref Range Status   Specimen Description   Final    URINE, RANDOM Performed at [facility], [address], [place], [place] [address]    Special Requests   Final    NONE Reflexed from [id number] Performed at [facility], [address] [place], [place] [address]    Culture   Final    NO GROWTH Performed at [facility], [address], [place], [place] [address]    Report Status 01/14/2023 FINAL  Final  Blood culture (routine x 2)     Status: None (Preliminary result)   Collection Time: 01/13/23  3:24 AM   Specimen:  BLOOD RIGHT HAND  Result Value Ref Range Status   Specimen  Description BLOOD RIGHT HAND  Final   Special Requests   Final    BOTTLES DRAWN AEROBIC AND ANAEROBIC Blood Culture adequate volume   Culture   Final    NO GROWTH 2 DAYS Performed at [facility], [address] [place], [place] [address]    Report Status PENDING  Incomplete  Blood culture (routine x 2)     Status: None (Preliminary result)   Collection Time: 01/13/23  3:39 AM   Specimen: BLOOD LEFT HAND  Result Value Ref Range Status   Specimen Description BLOOD LEFT HAND  Final   Special Requests   Final    BOTTLES DRAWN AEROBIC AND ANAEROBIC Blood Culture adequate volume   Culture   Final    NO GROWTH 2 DAYS Performed at [facility], [address] [place], [place] [address]    Report Status PENDING  Incomplete   Radiology Studies: VAS [place] UPPER EXTREMITY VENOUS DUPLEX  Result Date: 01/15/2023 UPPER VENOUS STUDY  Patient Name:  BAYA PRESSLER  Date of Exam:   01/14/2023 Medical Rec #: [id number]      Accession #:    [id number] Date of Birth: Sep 13, 1945      Patient Gender: F Patient Age:   4 years Exam Location:  [facility] Procedure:      VAS [place] UPPER EXTREMITY VENOUS DUPLEX Referring Phys: Savalas Monje KHATRI --------------------------------------------------------------------------------  Indications: Pain, and Swelling Risk Factors: Past pregnancy and obesity. Limitations: Body habitus and limited range of motion. Comparison Study: No prior study Performing Technologist: Shona Simpson  Examination Guidelines: A complete evaluation includes B-mode imaging, spectral Doppler, color Doppler, and power Doppler as needed of all accessible portions of each vessel. Bilateral testing is considered an integral part of a complete examination. Limited examinations for reoccurring indications may be performed as noted.  Right Findings: +----------+------------+---------+-----------+----------+-------+ RIGHT      CompressiblePhasicitySpontaneousPropertiesSummary +----------+------------+---------+-----------+----------+-------+ Subclavian    Full       Yes       Yes                      +----------+------------+---------+-----------+----------+-------+  Left Findings: +----------+------------+---------+-----------+----------+-------+ LEFT      CompressiblePhasicitySpontaneousPropertiesSummary +----------+------------+---------+-----------+----------+-------+ IJV           Full       Yes       Yes                      +----------+------------+---------+-----------+----------+-------+ Subclavian    Full       Yes       Yes                      +----------+------------+---------+-----------+----------+-------+ Axillary      Full       Yes       Yes                      +----------+------------+---------+-----------+----------+-------+ Brachial      Full       Yes       Yes                      +----------+------------+---------+-----------+----------+-------+ Radial        Full       Yes       Yes                      +----------+------------+---------+-----------+----------+-------+  Ulnar         Full       Yes       Yes                      +----------+------------+---------+-----------+----------+-------+ Cephalic      Full       Yes       Yes                      +----------+------------+---------+-----------+----------+-------+ Basilic       Full       Yes       Yes                      +----------+------------+---------+-----------+----------+-------+  Summary:  Right: No evidence of thrombosis in the subclavian.  Left: No evidence of deep vein thrombosis in the upper extremity. No evidence of superficial vein thrombosis in the upper extremity.  *See table(s) above for measurements and observations.  Diagnosing physician: Coral Else MD Electronically signed by Coral Else MD on 01/15/2023 at 10:45:11 AM.    Final     Scheduled Meds:   amitriptyline  25 mg Oral QHS   atorvastatin  40 mg Oral Daily   carvedilol  25 mg Oral BID WC   clonazePAM  0.5 mg Oral QHS   enoxaparin (LOVENOX) injection  40 mg Subcutaneous Q24H   ferrous sulfate  325 mg Oral Q breakfast   gabapentin  300 mg Oral BID   hydrOXYzine  25 mg Oral QHS   insulin aspart  0-15 Units Subcutaneous TID WC   insulin aspart  0-5 Units Subcutaneous QHS   irbesartan  150 mg Oral Daily   levothyroxine  100 mcg Oral QAC breakfast   mirabegron ER  50 mg Oral Daily   pantoprazole  40 mg Oral Daily   saccharomyces boulardii  250 mg Oral Daily   tiZANidine  4 mg Oral QHS   torsemide  5 mg Oral Daily   Continuous Infusions:  sodium chloride Stopped (01/14/23 0940)    ceFAZolin (ANCEF) IV 1 g (01/15/23 1212)     LOS: 2 days    Time spent: 35 mins    Willeen Niece, MD [facility]   If 7PM-7AM, please contact night-coverage

## 2023-01-15 NOTE — Evaluation (Signed)
Occupational Therapy Evaluation Patient Details Name: [name] MRN: [id number] DOB: 12/02/1945 Today's Date: 01/15/2023   History of Present Illness Patient is a 77 year old female who presented back to the hospital with chills rigors, malaise and weakness after being discharged on 8/29 with over 24 hours without recommended antibiotics. PMH: postpolio paralysis, diastolic CHF, obesity, chronic back pain, hypertension, well-controlled type 2 diabetes, who was recently discharged on 8/29 after admission for pyelonephritis   Clinical Impression   Patient evaluated by Occupational Therapy with no further acute OT needs identified. All education has been completed and the patient has no further questions. Patient was re educated on HEP and importance of maintaining ROM on BUE stressed to patient and daughter in room. Patient and daughter verbalized and demonstrated understanding. Patient was educated on having high risk to damage to bilateral shoulders with current methods of transfers at home secondary to not liking the total lift per patient report. Patient was encouraged to use total lift for transfers in next level of care.  Patient was able to complete HEP form last admission and strongly educated on importance of continued movement of BUE.  See below for any follow-up Occupational Therapy or equipment needs. OT is signing off. Thank you for this referral.       If plan is discharge home, recommend the following: A lot of help with bathing/dressing/bathroom;Assistance with cooking/housework;Direct supervision/assist for medications management;Assist for transportation;Help with stairs or ramp for entrance;Direct supervision/assist for financial management;Two people to help with walking and/or transfers    Functional Status Assessment  Patient has had a recent decline in their functional status and demonstrates the ability to make significant improvements in function in a reasonable and  predictable amount of time.  Equipment Recommendations  None recommended by OT       Precautions / Restrictions Precautions Precaution Comments: LE paralysis, L knee painful, says it was not bending, now the knee bends, H/O L patella  fracture 2015 Restrictions Weight Bearing Restrictions: Yes RLE Weight Bearing: Non weight bearing LLE Weight Bearing: Non weight bearing             ADL either performed or assessed with clinical judgement   ADL Overall ADL's : At baseline           General ADL Comments: patient and daughter were educated on pain management and keeping BUE elevated to prevent futher edema. patient reported that she did nto want to move LUE because of pain. patient was educated multiple times during session that she needed to keep moving LUE to maintain strength and ROM. imaging taken of L shoulder revealed "Mild degenerative joint disease of left glenohumeral joint is noted.  Some widening of the joint space is noted as well suggesting  ligamentous laxity." patient's daughter asked what this means. patient and daughter were educated on importance of protecting BUE and using mechainical lifts at home. patient's daughter reported that patients husband pulls on patients BUE to complete various transfers with long sitting scooting at home. patient and daughter were educated on how that is a risk to damaging joints and importance of using gait belt if you choose to continue such transfers but was not recommended. patients daughter verbalized understanding. patients daughter was shown verbally and visually on her own arms with use of gait belt vs not with sit to stands. patient and daughter were able to remember HEP from previous session and carryover some exercises. patient was provided with stress ball to add to BUE exercises  at this time.                  Pertinent Vitals/Pain Pain Assessment Pain Assessment: Faces Faces Pain Scale: Hurts whole lot Pain Location: L  hand/wrist area cellulitis Pain Descriptors / Indicators: Discomfort, Grimacing, Guarding Pain Intervention(s): Limited activity within patient's tolerance, Monitored during session, Premedicated before session     Extremity/Trunk Assessment Upper Extremity Assessment Upper Extremity Assessment: RUE deficits/detail;LUE deficits/detail RUE Deficits / Details: patient with about 10 degrees of FF and 20 degrees ABduction. patient was able to tolerate passive ROM to about 60 degrees ABduction and FF. patient is able to AROM ER about 30 degrees LUE Deficits / Details: aptient noted to have redness and edema on L wrist/hand area. patient limited ROM and increased pain with movement. able to [facility] elbow and shoulder. patient wrist has about 5 degrees of movement. patient is able to achieve neutral when attempting supination.patient unable to make fist with hand and limited movements of digits secondary to pain and edema.           Communication     Cognition Arousal: Alert Behavior During Therapy: WFL for tasks assessed/performed Overall Cognitive Status: Within Functional Limits for tasks assessed         General Comments: daughter was present during session.     General Comments       Exercises Hand Exercises Forearm Supination: AROM, Both, 10 reps Forearm Pronation: Both, 10 reps Wrist Flexion: 5 reps, AROM, Left Wrist Extension: Left, 5 reps, AROM Digit Composite Flexion: AROM, Both, 5 reps Composite Extension: AROM, Both, 5 reps   Shoulder Instructions      Home Living Family/patient expects to be discharged to:: Private residence Living Arrangements: Spouse/significant other Available Help at Discharge: Family;Available 24 hours/day Type of Home: House Home Access: Ramped entrance     Home Layout: One level     Bathroom Shower/Tub: Tub only   Firefighter: Standard     Home Equipment: Wheelchair - Runner, broadcasting/film/video;Hospital bed   Additional  Comments: lift chair, hoyer lift      Prior Functioning/Environment Prior Level of Function : Needs assist       Physical Assist : Mobility (physical);ADLs (physical) Mobility (physical): Bed mobility;Transfers ADLs (physical): Bathing;Dressing;Toileting;IADLs Mobility Comments: patien scoots forward onto bari BSC backwards (legs pass through the back bar) then scoots back into WC/scooter. Sleeps in recliner., spouse  palced a tub in garage and patient is lifted down into it with [employer], has adaptive truck that [facility] will  lift into          OT Problem List: Impaired UE functional use;Pain;Decreased activity tolerance      OT Treatment/Interventions: Therapeutic exercise;Self-care/ADL training;Patient/family education;Therapeutic activities    OT Goals(Current goals can be found in the care plan section) Acute Rehab OT Goals Patient Stated Goal: to get arm better OT Goal Formulation: All assessment and education complete, DC therapy  OT Frequency:         AM-PAC OT "6 Clicks" Daily Activity     Outcome Measure Help from another person eating meals?: A Little Help from another person taking care of personal grooming?: A Little Help from another person toileting, which includes using toliet, bedpan, or urinal?: Total Help from another person bathing (including washing, rinsing, drying)?: Total Help from another person to put on and taking off regular upper body clothing?: Total Help from another person to put on and taking off regular lower body clothing?: Total 6 Click Score: 10  End of Session Nurse Communication: Other (comment) (ok to participate in session)  Activity Tolerance: Patient limited by pain Patient left: in bed;with call bell/phone within reach;with family/visitor present  OT Visit Diagnosis: Pain Pain - Right/Left: Left Pain - part of body: Arm                Time: 4098-1191 OT Time Calculation (min): 25 min Charges:  OT General Charges $OT Visit: 1  Visit OT Evaluation $OT Eval Low Complexity: 1 Low OT Treatments $Self Care/Home Management : 8-22 mins  Rosalio Loud, MS Acute Rehabilitation Department Office# [phone]   Selinda Flavin 01/15/2023, 12:31 PM

## 2023-01-16 DIAGNOSIS — N390 Urinary tract infection, site not specified: Secondary | ICD-10-CM | POA: Diagnosis not present

## 2023-01-16 DIAGNOSIS — A419 Sepsis, unspecified organism: Secondary | ICD-10-CM | POA: Diagnosis not present

## 2023-01-16 LAB — GLUCOSE, CAPILLARY
Glucose-Capillary: 181 mg/dL — ABNORMAL HIGH (ref 70–99)
Glucose-Capillary: 118 mg/dL — ABNORMAL HIGH (ref 70–99)
Glucose-Capillary: 157 mg/dL — ABNORMAL HIGH (ref 70–99)
Glucose-Capillary: 150 mg/dL — ABNORMAL HIGH (ref 70–99)

## 2023-01-16 LAB — PHOSPHORUS: Phosphorus: 2.8 mg/dL (ref 2.5–4.6)

## 2023-01-16 LAB — BASIC METABOLIC PANEL
Anion gap: 11 (ref 5–15)
BUN: 7 mg/dL — ABNORMAL LOW (ref 8–23)
CO2: 25 mmol/L (ref 22–32)
Calcium: 8.4 mg/dL — ABNORMAL LOW (ref 8.9–10.3)
Chloride: 96 mmol/L — ABNORMAL LOW (ref 98–111)
Creatinine, Ser: 0.42 mg/dL — ABNORMAL LOW (ref 0.44–1.00)
GFR, Estimated: >60 mL/min (ref >60)
Glucose, Bld: 185 mg/dL — ABNORMAL HIGH (ref 70–99)
Potassium: 3.4 mmol/L — ABNORMAL LOW (ref 3.5–5.1)
Sodium: 132 mmol/L — ABNORMAL LOW (ref 135–145)

## 2023-01-16 LAB — CBC
HCT: 33.2 % — ABNORMAL LOW (ref 36.0–46.0)
Hemoglobin: 10.9 g/dL — ABNORMAL LOW (ref 12.0–15.0)
MCH: 30.2 pg (ref 26.0–34.0)
MCHC: 32.8 g/dL (ref 30.0–36.0)
MCV: 92 fL (ref 80.0–100.0)
Platelets: 415 10*3/uL — ABNORMAL HIGH (ref 150–400)
RBC: 3.61 MIL/uL — ABNORMAL LOW (ref 3.87–5.11)
RDW: 13.8 % (ref 11.5–15.5)
WBC: 13 10*3/uL — ABNORMAL HIGH (ref 4.0–10.5)
nRBC: 0 % (ref 0.0–0.2)

## 2023-01-16 LAB — MAGNESIUM: Magnesium: 1.3 mg/dL — ABNORMAL LOW (ref 1.7–2.4)

## 2023-01-16 MED ORDER — IRBESARTAN 300 MG PO TABS: 300.0000 mg | ORAL_TABLET | Freq: Every day | ORAL | Status: DC

## 2023-01-16 MED ORDER — POTASSIUM CHLORIDE 20 MEQ PO PACK
40.0000 meq | PACK | Freq: Once | ORAL | Status: AC
  Administered 2023-01-16: 40 meq via ORAL
  Filled 2023-01-16: qty 2

## 2023-01-16 MED ORDER — MAGNESIUM SULFATE 2 GM/50ML IV SOLN
2.0000 g | Freq: Once | INTRAVENOUS | Status: AC
  Administered 2023-01-16: 2 g via INTRAVENOUS
  Filled 2023-01-16: qty 50

## 2023-01-16 NOTE — Plan of Care (Signed)
  Problem: Education: Goal: Knowledge of General Education information will improve Description: Including pain rating scale, medication(s)/side effects and non-pharmacologic comfort measures Outcome: Not Progressing   Problem: Health Behavior/Discharge Planning: Goal: Ability to manage health-related needs will improve Outcome: Not Progressing   

## 2023-01-16 NOTE — Progress Notes (Signed)
PROGRESS NOTE    [name]  ZOX:[id number] DOB: 11-03-1945 DOA: 01/13/2023  PCP: Charlane Ferretti, DO   Brief Narrative:  This 77 yrs old female with PMH significant for postpolio paralysis, diastolic CHF, obesity, chronic back pain, hypertension, well-controlled type 2 diabetes, who was recently discharged on 8/29 after admission for pyelonephritis and is being readmitted to the hospital for complaints of malaise and chills, found to have recurrent sepsis.  Patient was discharged on oral antibiotics but she has not taken oral antibiotics for more than 24 hours after discharge.  She started feeling malaise weak and tired and reports subjective chills with rigors.  ED workup reveals blood pressure is on the soft side.  Lab work shows WBC 15K, lactate 2.2 which improved with IV hydration. Patient started on IV antibiotics,  admitted for further evaluation. blood and urine cultures were sent.  Assessment & Plan:   Principal Problem:   Sepsis secondary to UTI (HCC)  Sepsis secondary to UTI: Patient presented with leukocytosis, hypotension, lactic acid 2.2, source of infection. She was discharged home on oral antibiotics but she has not taken antibiotics for 36 hours after discharge.   Now she has developed cough but no shortness of breath. UA consistent with UTI, patient is hemodynamically stable. Blood and urine cultures no growth so far. Continue empiric antibiotics  IV Ancef Continue IV fluid resuscitation. Lactic acid improving with IV hydration.  Urinary tract infection : As presumed source of sepsis, given recent culture and partially treated infection. Continue IV Ancef Anticipate transition to oral antibiotics at discharge   Leukocytosis sec to UTI: slightly worsened, likely due to UTI as above   Diastolic CHF: Patient appears euvolemic,  Continue Coreg, Lipitor, Demadex   Hypothyroidism: Continue Synthroid.   Iron deficiency anemia: Continue ferrous sulfate.    Hypertension; Continue irbesartan daily   Chronic pain: Continue gabapentin, Zanaflex, Elavil.   Left hand swelling: Continue warm compresses, and elevation. Left upper extremity venous duplex negative for DVT  Hypomagnesemia: Replaced.  Continue to monitor  DVT prophylaxis: Lovenox Code Status:Full code Family Communication: Husband at bed side. Disposition Plan:     Status is: Inpatient Remains inpatient appropriate because: Admitted for sepsis secondary to UTI, blood and urine cultures no growth so far.  Has developed left arm swelling secondary to IV infiltration..    Consultants:  None  Procedures: None  Antimicrobials:  Anti-infectives (From admission, onward)    Start     Dose/Rate Route Frequency Ordered Stop   01/14/23 0300  ceFAZolin (ANCEF) IVPB 1 g/50 mL premix        1 g 100 mL/hr over 30 Minutes Intravenous Every 8 hours 01/13/23 0455     01/13/23 0330  cefTRIAXone (ROCEPHIN) 2 g in sodium chloride 0.9 % 100 mL IVPB        2 g 200 mL/hr over 30 Minutes Intravenous  Once 01/13/23 0318 01/13/23 0410      Subjective: Patient was seen and examined at bedside. Overnight events noted.   Patient reports doing much better.  She denies any burning urination. Her left arm swelling is improving,  still has erythema and pain.  Objective: Vitals:   01/15/23 1319 01/15/23 2023 01/16/23 0545 01/16/23 1425  BP: 128/67 (!) 164/66 (!) 125/59 (!) 104/56  Pulse: (!) 56 70 85 62  Resp: 14 18 18    Temp: 98.8 F (37.1 C) 99 F (37.2 C) 98.8 F (37.1 C) 97.7 F (36.5 C)  TempSrc: Oral Oral Oral Oral  SpO2: 97% 98% 95% 93%  Weight:      Height:        Intake/Output Summary (Last 24 hours) at 01/16/2023 1505 Last data filed at 01/16/2023 0500 Gross per 24 hour  Intake --  Output 1650 ml  Net -1650 ml   Filed Weights   01/13/23 0037 01/13/23 1300  Weight: 107.5 kg 99.7 kg    Examination:  General exam: Appears calm and comfortable, deconditioned, not in  any acute distress. Respiratory system: CTA bilaterally. Respiratory effort normal. RR 15 Cardiovascular system: S1 & S2 heard, regular rate and rhythm, no murmur.  No pedal edema. Gastrointestinal system: Abdomen is non distended, soft and non tender.  Normal bowel sounds heard. Central nervous system: Alert and oriented x 3. No focal neurological deficits. Extremities: Left hand swollen, erythematous and tender. Skin: No rashes, lesions or ulcers Psychiatry: Judgement and insight appear normal. Mood & affect appropriate.     Data Reviewed: I have personally reviewed following labs and imaging studies  CBC: Recent Labs  Lab 01/11/23 0444 01/13/23 0147 01/14/23 0356 01/15/23 0729 01/16/23 0409  WBC 12.7* 14.6* 13.1* 13.0* 13.0*  NEUTROABS  --  9.2*  --   --   --   HGB 11.1* 11.2* 10.4* 10.3* 10.9*  HCT 33.6* 33.8* 32.9* 32.1* 33.2*  MCV 92.8 91.1 94.3 89.9 92.0  PLT 386 399 364 383 415*   Basic Metabolic Panel: Recent Labs  Lab 01/10/23 2307 01/11/23 0913 01/13/23 0147 01/14/23 0356 01/15/23 0729 01/16/23 0409  NA 134*  --  130* 134* 129* 132*  K 3.3* 3.8 3.8 3.6 3.5 3.4*  CL 100  --  96* 100 96* 96*  CO2 24  --  23 23 25 25   GLUCOSE 213*  --  189* 177* 150* 185*  BUN 6*  --  9 8 8  7*  CREATININE 0.36*  --  0.37* 0.42* 0.41* 0.42*  CALCIUM 8.2*  --  8.8* 8.1* 8.2* 8.4*  MG 1.3*  --   --   --  1.0* 1.3*  PHOS 2.9  --   --   --  3.8 2.8   GFR: Estimated Creatinine Clearance: 60.5 mL/min (A) (by C-G formula based on SCr of 0.42 mg/dL (L)). Liver Function Tests: No results for input(s): "AST", "ALT", "ALKPHOS", "BILITOT", "PROT", "ALBUMIN" in the last 168 hours.  No results for input(s): "LIPASE", "AMYLASE" in the last 168 hours.  No results for input(s): "AMMONIA" in the last 168 hours. Coagulation Profile: No results for input(s): "INR", "PROTIME" in the last 168 hours. Cardiac Enzymes: No results for input(s): "CKTOTAL", "CKMB", "CKMBINDEX", "TROPONINI" in  the last 168 hours. BNP (last 3 results) No results for input(s): "PROBNP" in the last 8760 hours. HbA1C: No results for input(s): "HGBA1C" in the last 72 hours. CBG: Recent Labs  Lab 01/15/23 1154 01/15/23 1650 01/15/23 2144 01/16/23 0732 01/16/23 1144  GLUCAP 172* 186* 183* 181* 118*   Lipid Profile: No results for input(s): "CHOL", "HDL", "LDLCALC", "TRIG", "CHOLHDL", "LDLDIRECT" in the last 72 hours. Thyroid Function Tests: No results for input(s): "TSH", "T4TOTAL", "FREET4", "T3FREE", "THYROIDAB" in the last 72 hours. Anemia Panel: No results for input(s): "VITAMINB12", "FOLATE", "FERRITIN", "TIBC", "IRON", "RETICCTPCT" in the last 72 hours. Sepsis Labs: Recent Labs  Lab 01/13/23 0207 01/13/23 0453  LATICACIDVEN 2.2* 2.0*    Recent Results (from the past 240 hour(s))  Blood culture (routine x 2)     Status: None   Collection Time: 01/07/23  4:49 PM  Specimen: BLOOD RIGHT FOREARM  Result Value Ref Range Status   Specimen Description   Final    BLOOD RIGHT FOREARM Performed at [facility], [address], [place], [place] [address]    Special Requests   Final    BOTTLES DRAWN AEROBIC ONLY Blood Culture adequate volume Performed at [facility], [address] [place], [place] [address]    Culture   Final    NO GROWTH 5 DAYS Performed at [facility], [address], [place], [place] [address]    Report Status 01/13/2023 FINAL  Final  Urine Culture     Status: Abnormal   Collection Time: 01/07/23  5:18 PM   Specimen: Urine, Random  Result Value Ref Range Status   Specimen Description   Final    URINE, RANDOM Performed at [facility], [address] [place], [place] [address]    Special Requests   Final    NONE Reflexed from [id number] Performed at [facility], [address] [place], [place] [address]    Culture >=100,000 COLONIES/mL KLEBSIELLA PNEUMONIAE (A)  Final    Report Status 01/09/2023 FINAL  Final   Organism ID, Bacteria KLEBSIELLA PNEUMONIAE (A)  Final      Susceptibility   Klebsiella pneumoniae - MIC*    AMPICILLIN RESISTANT Resistant     CEFAZOLIN <=4 SENSITIVE Sensitive     CEFEPIME <=0.12 SENSITIVE Sensitive     CEFTRIAXONE <=0.25 SENSITIVE Sensitive     CIPROFLOXACIN <=0.25 SENSITIVE Sensitive     GENTAMICIN <=1 SENSITIVE Sensitive     IMIPENEM <=0.25 SENSITIVE Sensitive     NITROFURANTOIN 64 INTERMEDIATE Intermediate     TRIMETH/SULFA <=20 SENSITIVE Sensitive     AMPICILLIN/SULBACTAM 4 SENSITIVE Sensitive     PIP/TAZO <=4 SENSITIVE Sensitive     * >=100,000 COLONIES/mL KLEBSIELLA PNEUMONIAE  Blood culture (routine x 2)     Status: None   Collection Time: 01/07/23  5:40 PM   Specimen: BLOOD  Result Value Ref Range Status   Specimen Description   Final    BLOOD LEFT ANTECUBITAL Performed at [facility], [address], [place], [place] [address]    Special Requests   Final    BOTTLES DRAWN AEROBIC AND ANAEROBIC Blood Culture adequate volume Performed at [facility], [address] [place], [place] [address]    Culture   Final    NO GROWTH 5 DAYS Performed at [facility], [address], [place], [place] [address]    Report Status 01/13/2023 FINAL  Final  Urine Culture     Status: None   Collection Time: 01/13/23  2:02 AM   Specimen: Urine, Random  Result Value Ref Range Status   Specimen Description   Final    URINE, RANDOM Performed at [facility], [address], [place], [place] [address]    Special Requests   Final    NONE Reflexed from [id number] Performed at [facility], [address] [place], [place] [address]    Culture   Final    NO GROWTH Performed at [facility], [address], [place], [place] [address]    Report Status 01/14/2023 FINAL  Final  Blood culture (routine x 2)     Status: None (Preliminary result)   Collection  Time: 01/13/23  3:24 AM   Specimen: BLOOD RIGHT HAND  Result Value Ref Range Status   Specimen  Description BLOOD RIGHT HAND  Final   Special Requests   Final    BOTTLES DRAWN AEROBIC AND ANAEROBIC Blood Culture adequate volume   Culture   Final    NO GROWTH 3 DAYS Performed at [facility], [address], [place], [place] [address]    Report Status PENDING  Incomplete  Blood culture (routine x 2)     Status: None (Preliminary result)   Collection Time: 01/13/23  3:39 AM   Specimen: BLOOD LEFT HAND  Result Value Ref Range Status   Specimen Description BLOOD LEFT HAND  Final   Special Requests   Final    BOTTLES DRAWN AEROBIC AND ANAEROBIC Blood Culture adequate volume   Culture   Final    NO GROWTH 3 DAYS Performed at [facility], [address] [place], [place] [address]    Report Status PENDING  Incomplete   Radiology Studies: VAS [place] UPPER EXTREMITY VENOUS DUPLEX  Result Date: 01/15/2023 UPPER VENOUS STUDY  Patient Name:  [name]  Date of Exam:   01/14/2023 Medical Rec #: [id number]      Accession #:    [id number] Date of Birth: 08-09-1945      Patient Gender: F Patient Age:   31 years Exam Location:  [facility] Procedure:      VAS [place] UPPER EXTREMITY VENOUS DUPLEX Referring Phys: Krystin Keeven KHATRI --------------------------------------------------------------------------------  Indications: Pain, and Swelling Risk Factors: Past pregnancy and obesity. Limitations: Body habitus and limited range of motion. Comparison Study: No prior study Performing Technologist: Shona Simpson  Examination Guidelines: A complete evaluation includes B-mode imaging, spectral Doppler, color Doppler, and power Doppler as needed of all accessible portions of each vessel. Bilateral testing is considered an integral part of a complete examination. Limited examinations for reoccurring indications may be performed as noted.  Right Findings:  +----------+------------+---------+-----------+----------+-------+ RIGHT     CompressiblePhasicitySpontaneousPropertiesSummary +----------+------------+---------+-----------+----------+-------+ Subclavian    Full       Yes       Yes                      +----------+------------+---------+-----------+----------+-------+  Left Findings: +----------+------------+---------+-----------+----------+-------+ LEFT      CompressiblePhasicitySpontaneousPropertiesSummary +----------+------------+---------+-----------+----------+-------+ IJV           Full       Yes       Yes                      +----------+------------+---------+-----------+----------+-------+ Subclavian    Full       Yes       Yes                      +----------+------------+---------+-----------+----------+-------+ Axillary      Full       Yes       Yes                      +----------+------------+---------+-----------+----------+-------+ Brachial      Full       Yes       Yes                      +----------+------------+---------+-----------+----------+-------+ Radial        Full       Yes       Yes                      +----------+------------+---------+-----------+----------+-------+  Ulnar         Full       Yes       Yes                      +----------+------------+---------+-----------+----------+-------+ Cephalic      Full       Yes       Yes                      +----------+------------+---------+-----------+----------+-------+ Basilic       Full       Yes       Yes                      +----------+------------+---------+-----------+----------+-------+  Summary:  Right: No evidence of thrombosis in the subclavian.  Left: No evidence of deep vein thrombosis in the upper extremity. No evidence of superficial vein thrombosis in the upper extremity.  *See table(s) above for measurements and observations.  Diagnosing physician: Coral Else MD Electronically signed by Coral Else MD on 01/15/2023 at 10:45:11 AM.    Final     Scheduled Meds:  amitriptyline  25 mg Oral QHS   atorvastatin  40 mg Oral Daily   carvedilol  25 mg Oral BID WC   clonazePAM  0.5 mg Oral QHS   enoxaparin (LOVENOX) injection  40 mg Subcutaneous Q24H   ferrous sulfate  325 mg Oral Q breakfast   gabapentin  300 mg Oral BID   hydrOXYzine  25 mg Oral QHS   insulin aspart  0-15 Units Subcutaneous TID WC   insulin aspart  0-5 Units Subcutaneous QHS   irbesartan  150 mg Oral Daily   levothyroxine  100 mcg Oral QAC breakfast   mirabegron ER  50 mg Oral Daily   pantoprazole  40 mg Oral Daily   saccharomyces boulardii  250 mg Oral Daily   tiZANidine  4 mg Oral QHS   torsemide  5 mg Oral Daily   Continuous Infusions:  sodium chloride Stopped (01/14/23 0940)    ceFAZolin (ANCEF) IV 1 g (01/16/23 1212)     LOS: 3 days    Time spent: 35 mins    Willeen Niece, MD [facility]   If 7PM-7AM, please contact night-coverage

## 2023-01-17 DIAGNOSIS — A414 Sepsis due to anaerobes: Secondary | ICD-10-CM | POA: Diagnosis not present

## 2023-01-17 DIAGNOSIS — N390 Urinary tract infection, site not specified: Secondary | ICD-10-CM | POA: Diagnosis not present

## 2023-01-17 DIAGNOSIS — L03114 Cellulitis of left upper limb: Secondary | ICD-10-CM

## 2023-01-17 DIAGNOSIS — A419 Sepsis, unspecified organism: Secondary | ICD-10-CM | POA: Diagnosis not present

## 2023-01-17 LAB — GLUCOSE, CAPILLARY
Glucose-Capillary: 125 mg/dL — ABNORMAL HIGH (ref 70–99)
Glucose-Capillary: 159 mg/dL — ABNORMAL HIGH (ref 70–99)
Glucose-Capillary: 121 mg/dL — ABNORMAL HIGH (ref 70–99)
Glucose-Capillary: 189 mg/dL — ABNORMAL HIGH (ref 70–99)

## 2023-01-17 LAB — BASIC METABOLIC PANEL
Anion gap: 10 (ref 5–15)
BUN: 13 mg/dL (ref 8–23)
CO2: 24 mmol/L (ref 22–32)
Calcium: 8 mg/dL — ABNORMAL LOW (ref 8.9–10.3)
Chloride: 95 mmol/L — ABNORMAL LOW (ref 98–111)
Creatinine, Ser: 0.35 mg/dL — ABNORMAL LOW (ref 0.44–1.00)
GFR, Estimated: >60 mL/min (ref >60)
Glucose, Bld: 177 mg/dL — ABNORMAL HIGH (ref 70–99)
Potassium: 3.8 mmol/L (ref 3.5–5.1)
Sodium: 129 mmol/L — ABNORMAL LOW (ref 135–145)

## 2023-01-17 LAB — CBC
HCT: 30.2 % — ABNORMAL LOW (ref 36.0–46.0)
Hemoglobin: 9.6 g/dL — ABNORMAL LOW (ref 12.0–15.0)
MCH: 29.4 pg (ref 26.0–34.0)
MCHC: 31.8 g/dL (ref 30.0–36.0)
MCV: 92.4 fL (ref 80.0–100.0)
Platelets: 402 10*3/uL — ABNORMAL HIGH (ref 150–400)
RBC: 3.27 MIL/uL — ABNORMAL LOW (ref 3.87–5.11)
RDW: 13.9 % (ref 11.5–15.5)
WBC: 10.7 10*3/uL — ABNORMAL HIGH (ref 4.0–10.5)
nRBC: 0 % (ref 0.0–0.2)

## 2023-01-17 LAB — MAGNESIUM: Magnesium: 1.3 mg/dL — ABNORMAL LOW (ref 1.7–2.4)

## 2023-01-17 LAB — PHOSPHORUS: Phosphorus: 3.4 mg/dL (ref 2.5–4.6)

## 2023-01-17 MED ORDER — SODIUM CHLORIDE 0.9 % IV SOLN
100.0000 mg | Freq: Two times a day (BID) | INTRAVENOUS | Status: DC
  Administered 2023-01-17 (×2): 100 mg via INTRAVENOUS
  Filled 2023-01-17 (×3): qty 100

## 2023-01-17 MED ORDER — POLYETHYLENE GLYCOL 3350 17 G PO PACK
17.0000 g | PACK | Freq: Two times a day (BID) | ORAL | Status: AC
  Administered 2023-01-17 – 2023-01-18 (×4): 17 g via ORAL
  Filled 2023-01-17 (×4): qty 1

## 2023-01-17 NOTE — Progress Notes (Signed)
[facility] PROGRESS NOTE    Progress Note  [name]  FIE:[id number] DOB: August 22, 1945 DOA: 01/13/2023 PCP: Charlane Ferretti, DO     Brief Narrative:   [name] is an 77 y.o. female past medical history of postpolio paralysis, chronic diastolic heart failure, chronic back pain, well-controlled diabetes mellitus type 2 discharged from the hospital on 01/11/2023 for pyelonephritis readmitted.  Complains lesion chills found to be septic.  She was noncompliant with her antibiotics for like 48 hours, blood pressure in the ED was soft with a white count of 15 lactate of 2 start empirically on antibiotics and IV fluids.   Assessment/Plan:   Recurrent  Sepsis secondary to UTI : Urine culture on 01/07/2023 show E. coli sensitive to Ancef. Tmax of 99.3. Admission she was started on IV Ancef leukocytosis is improving she has remained afebrile. Continue IV Ancef.  Left upper extremity hand swelling erythema question likely due to cellulitis: Upper extremity Doppler was negative for DVT. Will start on IV Doxy, blood cultures have been negative till date. Try to keep arm elevated above heart level.  Chronic diastolic heart failure: Appears to be euvolemic, continue Coreg Lipitor and Demadex.  Hypothyroidism: Continue Synthroid.  Iron deficiency anemia: Continue ferrous sulfate.  Essential hypertension: Continue ARB.  Chronic pain: Continue gabapentin Zanaflex and Elavil.   Hypomagnesemia: Repleted.    DVT prophylaxis: lovenox Family Communication:husband Status is: Inpatient Remains inpatient appropriate because: Sepsis due to UTI    Code Status:     Code Status Orders  (From admission, onward)           Start     Ordered   01/13/23 0759  Full code  Continuous       Question:  By:  Answer:  Consent: discussion documented in EHR   01/13/23 0759           Code Status History     Date Active Date Inactive Code Status Order ID Comments User Context    01/07/2023 2101 01/11/2023 2111 Full Code [id number]  Gery Pray, MD ED   10/25/2022 1557 11/01/2022 1745 Full Code [id number]  Clydie Braun, MD Inpatient   07/18/2013 1459 07/22/2013 2023 Full Code [id number]  Verlee Rossetti, MD Inpatient         IV Access:   Peripheral IV   Procedures and diagnostic studies:   No results found.   Medical Consultants:   None.   Subjective:    [name] relates her left upper extremity is tender and swollen  Objective:    Vitals:   01/16/23 0545 01/16/23 1425 01/16/23 2047 01/17/23 0445  BP: (!) 125/59 (!) 104/56 (!) 160/72 (!) 97/52  Pulse: 85 62 71 73  Resp: 18  20 20   Temp: 98.8 F (37.1 C) 97.7 F (36.5 C) 99.3 F (37.4 C) 98.7 F (37.1 C)  TempSrc: Oral Oral Oral Oral  SpO2: 95% 93% 98% 93%  Weight:      Height:       SpO2: 93 %   Intake/Output Summary (Last 24 hours) at 01/17/2023 0749 Last data filed at 01/17/2023 0022 Gross per 24 hour  Intake 650 ml  Output 650 ml  Net 0 ml   Filed Weights   01/13/23 0037 01/13/23 1300  Weight: 107.5 kg 99.7 kg    Exam: General exam: In no acute distress. Respiratory system: Good air movement and clear to auscultation. Cardiovascular system: S1 & S2 heard, RRR. No JVD.  Gastrointestinal system: Abdomen  is nondistended, soft and nontender.  Extremities: No pedal edema. Skin: No rashes, lesions or ulcers Psychiatry: Judgement and insight appear normal. Mood & affect appropriate.    Data Reviewed:    Labs: Basic Metabolic Panel: Recent Labs  Lab 01/10/23 2307 01/11/23 0913 01/13/23 0147 01/14/23 0356 01/15/23 0729 01/16/23 0409 01/17/23 0404  NA 134*  --  130* 134* 129* 132* 129*  K 3.3*   < > 3.8 3.6 3.5 3.4* 3.8  CL 100  --  96* 100 96* 96* 95*  CO2 24  --  23 23 25 25 24   GLUCOSE 213*  --  189* 177* 150* 185* 177*  BUN 6*  --  9 8 8  7* 13  CREATININE 0.36*  --  0.37* 0.42* 0.41* 0.42* 0.35*  CALCIUM 8.2*  --  8.8* 8.1* 8.2* 8.4* 8.0*  MG 1.3*   --   --   --  1.0* 1.3* 1.3*  PHOS 2.9  --   --   --  3.8 2.8 3.4   < > = values in this interval not displayed.   GFR Estimated Creatinine Clearance: 60.5 mL/min (A) (by C-G formula based on SCr of 0.35 mg/dL (L)). Liver Function Tests: No results for input(s): "AST", "ALT", "ALKPHOS", "BILITOT", "PROT", "ALBUMIN" in the last 168 hours. No results for input(s): "LIPASE", "AMYLASE" in the last 168 hours. No results for input(s): "AMMONIA" in the last 168 hours. Coagulation profile No results for input(s): "INR", "PROTIME" in the last 168 hours. COVID-19 Labs  No results for input(s): "DDIMER", "FERRITIN", "LDH", "CRP" in the last 72 hours.  No results found for: "SARSCOV2NAA"  CBC: Recent Labs  Lab 01/13/23 0147 01/14/23 0356 01/15/23 0729 01/16/23 0409 01/17/23 0404  WBC 14.6* 13.1* 13.0* 13.0* 10.7*  NEUTROABS 9.2*  --   --   --   --   HGB 11.2* 10.4* 10.3* 10.9* 9.6*  HCT 33.8* 32.9* 32.1* 33.2* 30.2*  MCV 91.1 94.3 89.9 92.0 92.4  PLT 399 364 383 415* 402*   Cardiac Enzymes: No results for input(s): "CKTOTAL", "CKMB", "CKMBINDEX", "TROPONINI" in the last 168 hours. BNP (last 3 results) No results for input(s): "PROBNP" in the last 8760 hours. CBG: Recent Labs  Lab 01/15/23 2144 01/16/23 0732 01/16/23 1144 01/16/23 1643 01/16/23 2043  GLUCAP 183* 181* 118* 157* 150*   D-Dimer: No results for input(s): "DDIMER" in the last 72 hours. Hgb A1c: No results for input(s): "HGBA1C" in the last 72 hours. Lipid Profile: No results for input(s): "CHOL", "HDL", "LDLCALC", "TRIG", "CHOLHDL", "LDLDIRECT" in the last 72 hours. Thyroid function studies: No results for input(s): "TSH", "T4TOTAL", "T3FREE", "THYROIDAB" in the last 72 hours.  Invalid input(s): "FREET3" Anemia work up: No results for input(s): "VITAMINB12", "FOLATE", "FERRITIN", "TIBC", "IRON", "RETICCTPCT" in the last 72 hours. Sepsis Labs: Recent Labs  Lab 01/13/23 0207 01/13/23 0453 01/14/23 0356  01/15/23 0729 01/16/23 0409 01/17/23 0404  WBC  --   --  13.1* 13.0* 13.0* 10.7*  LATICACIDVEN 2.2* 2.0*  --   --   --   --    Microbiology Recent Results (from the past 240 hour(s))  Blood culture (routine x 2)     Status: None   Collection Time: 01/07/23  4:49 PM   Specimen: BLOOD RIGHT FOREARM  Result Value Ref Range Status   Specimen Description   Final    BLOOD RIGHT FOREARM Performed at [facility], [address], [place], [place] [address]    Special Requests  Final    BOTTLES DRAWN AEROBIC ONLY Blood Culture adequate volume Performed at [facility], [address] [place], [place] [address]    Culture   Final    NO GROWTH 5 DAYS Performed at [facility], [address] [place], [place] [address]    Report Status 01/13/2023 FINAL  Final  Urine Culture     Status: Abnormal   Collection Time: 01/07/23  5:18 PM   Specimen: Urine, Random  Result Value Ref Range Status   Specimen Description   Final    URINE, RANDOM Performed at [facility], [address], [place], [place] [address]    Special Requests   Final    NONE Reflexed from [id number] Performed at [facility], [address] [place], [place] [address]    Culture >=100,000 COLONIES/mL KLEBSIELLA PNEUMONIAE (A)  Final   Report Status 01/09/2023 FINAL  Final   Organism ID, Bacteria KLEBSIELLA PNEUMONIAE (A)  Final      Susceptibility   Klebsiella pneumoniae - MIC*    AMPICILLIN RESISTANT Resistant     CEFAZOLIN <=4 SENSITIVE Sensitive     CEFEPIME <=0.12 SENSITIVE Sensitive     CEFTRIAXONE <=0.25 SENSITIVE Sensitive     CIPROFLOXACIN <=0.25 SENSITIVE Sensitive     GENTAMICIN <=1 SENSITIVE Sensitive     IMIPENEM <=0.25 SENSITIVE Sensitive     NITROFURANTOIN 64 INTERMEDIATE Intermediate     TRIMETH/SULFA <=20 SENSITIVE Sensitive     AMPICILLIN/SULBACTAM 4 SENSITIVE Sensitive     PIP/TAZO <=4 SENSITIVE Sensitive     *  >=100,000 COLONIES/mL KLEBSIELLA PNEUMONIAE  Blood culture (routine x 2)     Status: None   Collection Time: 01/07/23  5:40 PM   Specimen: BLOOD  Result Value Ref Range Status   Specimen Description   Final    BLOOD LEFT ANTECUBITAL Performed at [facility], [address], [place], [place] [address]    Special Requests   Final    BOTTLES DRAWN AEROBIC AND ANAEROBIC Blood Culture adequate volume Performed at [facility], [address] [place], [place] [address]    Culture   Final    NO GROWTH 5 DAYS Performed at [facility], [address], [place], [place] [address]    Report Status 01/13/2023 FINAL  Final  Urine Culture     Status: None   Collection Time: 01/13/23  2:02 AM   Specimen: Urine, Random  Result Value Ref Range Status   Specimen Description   Final    URINE, RANDOM Performed at [facility], [address], [place], [place] [address]    Special Requests   Final    NONE Reflexed from [id number] Performed at [facility], [address], [place], [place] [address]    Culture   Final    NO GROWTH Performed at [facility], [address], [place], [place] [address]    Report Status 01/14/2023 FINAL  Final  Blood culture (routine x 2)     Status: None (Preliminary result)   Collection Time: 01/13/23  3:24 AM   Specimen: BLOOD RIGHT HAND  Result Value Ref Range Status   Specimen Description BLOOD RIGHT HAND  Final   Special Requests   Final    BOTTLES DRAWN AEROBIC AND ANAEROBIC Blood Culture adequate volume   Culture   Final    NO GROWTH 3 DAYS Performed at [facility],  [address], [place], [place] [address]    Report Status PENDING  Incomplete  Blood culture (routine x 2)     Status: None (Preliminary result)   Collection Time: 01/13/23  3:39 AM   Specimen: BLOOD LEFT HAND  Result Value Ref Range Status   Specimen Description BLOOD LEFT HAND  Final   Special Requests    Final    BOTTLES DRAWN AEROBIC AND ANAEROBIC Blood Culture adequate volume   Culture   Final    NO GROWTH 3 DAYS Performed at [facility], [address], [place], [place] [address]    Report Status PENDING  Incomplete     Medications:    amitriptyline  25 mg Oral QHS   atorvastatin  40 mg Oral Daily   carvedilol  25 mg Oral BID WC   clonazePAM  0.5 mg Oral QHS   enoxaparin (LOVENOX) injection  40 mg Subcutaneous Q24H   ferrous sulfate  325 mg Oral Q breakfast   gabapentin  300 mg Oral BID   hydrOXYzine  25 mg Oral QHS   insulin aspart  0-15 Units Subcutaneous TID WC   insulin aspart  0-5 Units Subcutaneous QHS   irbesartan  150 mg Oral Daily   levothyroxine  100 mcg Oral QAC breakfast   mirabegron ER  50 mg Oral Daily   pantoprazole  40 mg Oral Daily   saccharomyces boulardii  250 mg Oral Daily   tiZANidine  4 mg Oral QHS   torsemide  5 mg Oral Daily   Continuous Infusions:   ceFAZolin (ANCEF) IV 1 g (01/17/23 0523)      LOS: 4 days   Marinda Elk  [facility]  01/17/2023, 7:49 AM

## 2023-01-18 DIAGNOSIS — A414 Sepsis due to anaerobes: Secondary | ICD-10-CM | POA: Diagnosis not present

## 2023-01-18 DIAGNOSIS — A419 Sepsis, unspecified organism: Secondary | ICD-10-CM | POA: Diagnosis not present

## 2023-01-18 DIAGNOSIS — N390 Urinary tract infection, site not specified: Secondary | ICD-10-CM | POA: Diagnosis not present

## 2023-01-18 LAB — CULTURE, BLOOD (ROUTINE X 2)
Culture: NO GROWTH
Culture: NO GROWTH
Special Requests: ADEQUATE
Special Requests: ADEQUATE

## 2023-01-18 LAB — GLUCOSE, CAPILLARY
Glucose-Capillary: 138 mg/dL — ABNORMAL HIGH (ref 70–99)
Glucose-Capillary: 144 mg/dL — ABNORMAL HIGH (ref 70–99)
Glucose-Capillary: 151 mg/dL — ABNORMAL HIGH (ref 70–99)
Glucose-Capillary: 156 mg/dL — ABNORMAL HIGH (ref 70–99)

## 2023-01-18 MED ORDER — FERROUS SULFATE 325 (65 FE) MG PO TABS
325.0000 mg | ORAL_TABLET | Freq: Every day | ORAL | Status: DC
  Administered 2023-01-19: 325 mg via ORAL
  Filled 2023-01-18: qty 1

## 2023-01-18 MED ORDER — DOXYCYCLINE HYCLATE 100 MG PO TABS
100.0000 mg | ORAL_TABLET | Freq: Two times a day (BID) | ORAL | Status: DC
  Administered 2023-01-18 (×2): 100 mg via ORAL
  Filled 2023-01-18 (×3): qty 1

## 2023-01-18 NOTE — Progress Notes (Addendum)
[facility] PROGRESS NOTE    Progress Note  [name]  ZOX:[id number] DOB: 09-12-1945 DOA: 01/13/2023 PCP: Charlane Ferretti, DO     Brief Narrative:   [name] is an 77 y.o. female past medical history of postpolio paralysis, chronic diastolic heart failure, chronic back pain, well-controlled diabetes mellitus type 2 discharged from the hospital on 01/11/2023 for pyelonephritis readmitted.  Complains lesion chills found to be septic.  She was noncompliant with her antibiotics for like 48 hours, blood pressure in the ED was soft with a white count of 15 lactate of 2 start empirically on antibiotics and IV fluids.   Assessment/Plan:   Recurrent  Sepsis secondary to UTI : Urine culture on 01/07/2023 show E. coli sensitive to Ancef. Tmax of 99.3. Admission she was started on IV Ancef leukocytosis is improving she has remained afebrile. Continue IV Ancef. Blood cultures are remain negative till date.  Left upper extremity hand swelling erythema question likely due to cellulitis: Upper extremity Doppler was negative for DVT. Continue IV Doxy, blood cultures have been negative till date. Try to keep arm elevated above heart level, apply cold compresses.  Chronic diastolic heart failure: Appears to be euvolemic, continue Coreg Lipitor and Demadex.  Hypothyroidism: Continue Synthroid.  Iron deficiency anemia: Continue ferrous sulfate.  Essential hypertension: Continue ARB.  Chronic pain: Continue gabapentin Zanaflex and Elavil.  Hypomagnesemia: Repleted.    DVT prophylaxis: lovenox Family Communication:husband Status is: Inpatient Remains inpatient appropriate because: Sepsis due to UTI    Code Status:     Code Status Orders  (From admission, onward)           Start     Ordered   01/13/23 0759  Full code  Continuous       Question:  By:  Answer:  Consent: discussion documented in EHR   01/13/23 0759           Code Status History     Date  Active Date Inactive Code Status Order ID Comments User Context   01/07/2023 2101 01/11/2023 2111 Full Code [id number]  Gery Pray, MD ED   10/25/2022 1557 11/01/2022 1745 Full Code [id number]  Clydie Braun, MD Inpatient   07/18/2013 1459 07/22/2013 2023 Full Code [id number]  Verlee Rossetti, MD Inpatient         IV Access:   Peripheral IV   Procedures and diagnostic studies:   No results found.   Medical Consultants:   None.   Subjective:    [name] upper extremities pain is improved still swollen try to keep elevated.  Objective:    Vitals:   01/17/23 0445 01/17/23 1241 01/17/23 2032 01/18/23 0450  BP: (!) 97/52 (!) 156/79 (!) 132/56 (!) 132/53  Pulse: 73 (!) 59 62 (!) 58  Resp: 20 14 20 18   Temp: 98.7 F (37.1 C) 98.5 F (36.9 C) 98.4 F (36.9 C) 98.4 F (36.9 C)  TempSrc: Oral Oral Oral Oral  SpO2: 93% 99% 94% 100%  Weight:      Height:       SpO2: 100 %   Intake/Output Summary (Last 24 hours) at 01/18/2023 0812 Last data filed at 01/18/2023 0718 Gross per 24 hour  Intake 1272.49 ml  Output 3200 ml  Net -1927.51 ml   Filed Weights   01/13/23 0037 01/13/23 1300  Weight: 107.5 kg 99.7 kg    Exam: General exam: In no acute distress. Respiratory system: Good air movement and clear to auscultation. Cardiovascular system:  S1 & S2 heard, RRR. No JVD. Gastrointestinal system: Abdomen is nondistended, soft and nontender.  Extremities: Left upper extremity is swollen but less tender than yesterday. Psychiatry: Judgement and insight appear normal. Mood & affect appropriate.  Data Reviewed:    Labs: Basic Metabolic Panel: Recent Labs  Lab 01/13/23 0147 01/14/23 0356 01/15/23 0729 01/16/23 0409 01/17/23 0404  NA 130* 134* 129* 132* 129*  K 3.8 3.6 3.5 3.4* 3.8  CL 96* 100 96* 96* 95*  CO2 23 23 25 25 24   GLUCOSE 189* 177* 150* 185* 177*  BUN 9 8 8  7* 13  CREATININE 0.37* 0.42* 0.41* 0.42* 0.35*  CALCIUM 8.8* 8.1* 8.2* 8.4* 8.0*  MG   --   --  1.0* 1.3* 1.3*  PHOS  --   --  3.8 2.8 3.4   GFR Estimated Creatinine Clearance: 60.5 mL/min (A) (by C-G formula based on SCr of 0.35 mg/dL (L)). Liver Function Tests: No results for input(s): "AST", "ALT", "ALKPHOS", "BILITOT", "PROT", "ALBUMIN" in the last 168 hours. No results for input(s): "LIPASE", "AMYLASE" in the last 168 hours. No results for input(s): "AMMONIA" in the last 168 hours. Coagulation profile No results for input(s): "INR", "PROTIME" in the last 168 hours. COVID-19 Labs  No results for input(s): "DDIMER", "FERRITIN", "LDH", "CRP" in the last 72 hours.  No results found for: "SARSCOV2NAA"  CBC: Recent Labs  Lab 01/13/23 0147 01/14/23 0356 01/15/23 0729 01/16/23 0409 01/17/23 0404  WBC 14.6* 13.1* 13.0* 13.0* 10.7*  NEUTROABS 9.2*  --   --   --   --   HGB 11.2* 10.4* 10.3* 10.9* 9.6*  HCT 33.8* 32.9* 32.1* 33.2* 30.2*  MCV 91.1 94.3 89.9 92.0 92.4  PLT 399 364 383 415* 402*   Cardiac Enzymes: No results for input(s): "CKTOTAL", "CKMB", "CKMBINDEX", "TROPONINI" in the last 168 hours. BNP (last 3 results) No results for input(s): "PROBNP" in the last 8760 hours. CBG: Recent Labs  Lab 01/17/23 0800 01/17/23 1154 01/17/23 1555 01/17/23 2028 01/18/23 0750  GLUCAP 125* 159* 121* 189* 138*   D-Dimer: No results for input(s): "DDIMER" in the last 72 hours. Hgb A1c: No results for input(s): "HGBA1C" in the last 72 hours. Lipid Profile: No results for input(s): "CHOL", "HDL", "LDLCALC", "TRIG", "CHOLHDL", "LDLDIRECT" in the last 72 hours. Thyroid function studies: No results for input(s): "TSH", "T4TOTAL", "T3FREE", "THYROIDAB" in the last 72 hours.  Invalid input(s): "FREET3" Anemia work up: No results for input(s): "VITAMINB12", "FOLATE", "FERRITIN", "TIBC", "IRON", "RETICCTPCT" in the last 72 hours. Sepsis Labs: Recent Labs  Lab 01/13/23 0207 01/13/23 0453 01/14/23 0356 01/15/23 0729 01/16/23 0409 01/17/23 0404  WBC  --   --   13.1* 13.0* 13.0* 10.7*  LATICACIDVEN 2.2* 2.0*  --   --   --   --    Microbiology Recent Results (from the past 240 hour(s))  Urine Culture     Status: None   Collection Time: 01/13/23  2:02 AM   Specimen: Urine, Random  Result Value Ref Range Status   Specimen Description   Final    URINE, RANDOM Performed at [facility], [address] [place], [place] [address]    Special Requests   Final    NONE Reflexed from [id number] Performed at [facility], [address], [place], [place] [address]    Culture   Final    NO GROWTH Performed at [facility], [address], [place], [place] [address]    Report Status  01/14/2023 FINAL  Final  Blood culture (routine x 2)     Status: None (Preliminary result)   Collection Time: 01/13/23  3:24 AM   Specimen: BLOOD RIGHT HAND  Result Value Ref Range Status   Specimen Description BLOOD RIGHT HAND  Final   Special Requests   Final    BOTTLES DRAWN AEROBIC AND ANAEROBIC Blood Culture adequate volume   Culture   Final    NO GROWTH 4 DAYS Performed at [facility], [address], [place], [place] [address]    Report Status PENDING  Incomplete  Blood culture (routine x 2)     Status: None (Preliminary result)   Collection Time: 01/13/23  3:39 AM   Specimen: BLOOD LEFT HAND  Result Value Ref Range Status   Specimen Description BLOOD LEFT HAND  Final   Special Requests   Final    BOTTLES DRAWN AEROBIC AND ANAEROBIC Blood Culture adequate volume   Culture   Final    NO GROWTH 4 DAYS Performed at [facility], [address], [place], [place] [address]    Report Status PENDING  Incomplete     Medications:    amitriptyline  25 mg Oral QHS   atorvastatin  40 mg Oral Daily   carvedilol  25 mg Oral BID WC   clonazePAM  0.5 mg Oral QHS   enoxaparin (LOVENOX) injection  40 mg Subcutaneous Q24H   ferrous sulfate  325 mg Oral Q breakfast   gabapentin  300 mg Oral BID   hydrOXYzine  25  mg Oral QHS   insulin aspart  0-15 Units Subcutaneous TID WC   insulin aspart  0-5 Units Subcutaneous QHS   levothyroxine  100 mcg Oral QAC breakfast   mirabegron ER  50 mg Oral Daily   pantoprazole  40 mg Oral Daily   polyethylene glycol  17 g Oral BID   saccharomyces boulardii  250 mg Oral Daily   tiZANidine  4 mg Oral QHS   torsemide  5 mg Oral Daily   Continuous Infusions:   ceFAZolin (ANCEF) IV 1 g (01/18/23 0534)   doxycycline (VIBRAMYCIN) IV 100 mg (01/17/23 2039)      LOS: 5 days   Marinda Elk  [facility]  01/18/2023, 8:12 AM

## 2023-01-18 NOTE — Plan of Care (Signed)

## 2023-01-18 NOTE — Care Management Important Message (Signed)
Important Message  Patient Details IM Letter given. Name: [name] MRN: [id number] Date of Birth: Jun 13, 1945   Medicare Important Message Given:  Yes     Caren Macadam 01/18/2023, 1:01 PM

## 2023-01-19 DIAGNOSIS — A414 Sepsis due to anaerobes: Secondary | ICD-10-CM | POA: Diagnosis not present

## 2023-01-19 DIAGNOSIS — A419 Sepsis, unspecified organism: Secondary | ICD-10-CM | POA: Diagnosis not present

## 2023-01-19 DIAGNOSIS — N390 Urinary tract infection, site not specified: Secondary | ICD-10-CM | POA: Diagnosis not present

## 2023-01-19 LAB — GLUCOSE, CAPILLARY
Glucose-Capillary: 143 mg/dL — ABNORMAL HIGH (ref 70–99)
Glucose-Capillary: 203 mg/dL — ABNORMAL HIGH (ref 70–99)

## 2023-01-19 MED ORDER — CEPHALEXIN 500 MG PO CAPS
500.0000 mg | ORAL_CAPSULE | Freq: Three times a day (TID) | ORAL | Status: DC
  Administered 2023-01-19: 500 mg via ORAL
  Filled 2023-01-19: qty 1

## 2023-01-19 MED ORDER — DOXYCYCLINE HYCLATE 100 MG PO TABS: 100.0000 mg | ORAL_TABLET | Freq: Two times a day (BID) | ORAL | 0 refills | Status: AC

## 2023-01-19 MED ORDER — CEPHALEXIN 500 MG PO CAPS: 500.0000 mg | ORAL_CAPSULE | Freq: Three times a day (TID) | ORAL | 0 refills | Status: AC

## 2023-01-19 MED ORDER — DOXYCYCLINE HYCLATE 100 MG PO TABS
100.0000 mg | ORAL_TABLET | Freq: Two times a day (BID) | ORAL | Status: DC
  Administered 2023-01-19: 100 mg via ORAL

## 2023-01-19 MED ORDER — KETOROLAC TROMETHAMINE 15 MG/ML IJ SOLN: 15.0000 mg | Freq: Four times a day (QID) | INTRAMUSCULAR | Status: DC | PRN

## 2023-01-19 MED ORDER — PANTOPRAZOLE SODIUM 40 MG PO TBEC
40.0000 mg | DELAYED_RELEASE_TABLET | Freq: Two times a day (BID) | ORAL | Status: DC
  Administered 2023-01-19: 40 mg via ORAL
  Filled 2023-01-19: qty 1

## 2023-01-19 NOTE — Discharge Summary (Signed)
Physician Discharge Summary  [name] NFA:[id number] DOB: 03-Aug-1945 DOA: 01/13/2023  PCP: Charlane Ferretti, DO  Admit date: 01/13/2023 Discharge date: 01/19/2023  Admitted From: Home Disposition:  Home  Recommendations for Outpatient Follow-up:  Follow up with PCP in 1-2 weeks Please obtain BMP/CBC in one week   Home Health:Yes Equipment/Devices:None  Discharge Condition:Stable CODE STATUS:Full Diet recommendation: Heart Healthy    Brief/Interim Summary:  77 y.o. female past medical history of postpolio paralysis, chronic diastolic heart failure, chronic back pain, well-controlled diabetes mellitus type 2 discharged from the hospital on 01/11/2023 for pyelonephritis readmitted.  Complains lesion chills found to be septic.  She was noncompliant with her antibiotics for like 48 hours, blood pressure in the ED was soft with a white count of 15 lactate of 2 start empirically on antibiotics and IV fluids.   Discharge Diagnoses:  Principal Problem:   Sepsis secondary to UTI (HCC)  Recurrent  sepsis secondary to UTI Urine culture from 01/07/2023 show E. coli sensitive to Ancef she was started on this she defervesced leukocytosis improved. She was transition to oral Keflex which should continue as an outpatient. Repeated blood cultures on admission remain negative till date.  Left upper extremity hand swelling possibly due to cellulitis: Doppler was negative for DVT. She was started on Doxy which she continues and finish as an outpatient.  Chronic diastolic heart failure: No changes made to her medication continue current regimen.  Hypothyroidism: Continue Synthroid.  Iron deficiency anemia: Continue ferrous sulfate.  Essential hypertension  Continue ARB.  Chronic pain: Continue gabapentin, Zanaflex and Elavil.  Hypomagnesemia: Repleted now resolved.  Discharge Instructions  Discharge Instructions     Diet - low sodium heart healthy   Complete by: As directed     Increase activity slowly   Complete by: As directed    No wound care   Complete by: As directed       Allergies as of 01/19/2023       Reactions   Tamsulosin Other (See Comments)   Caused uncontrollable movements   Ciprofloxacin Rash   Carisoprodol Itching   Other Reaction(s): Unknown   Oxycodone-acetaminophen    Other Reaction(s): ITCHING   Penicillins Itching   Pt has tolerated rocephin IV Other Reaction(s): ITCHING   Sulfa Antibiotics Other (See Comments)   Unknown.  States as always told had an allergy Other Reaction(s): Unknown   Acetaminophen Itching   Other Reaction(s): ITCHING   Aspirin Itching, Other (See Comments)   Chest pain, also   Macrodantin [nitrofurantoin Macrocrystal] Rash   Nsaids Other (See Comments)   Chest pain and stomach pain Other Reaction(s): Unknown   Percocet [oxycodone-acetaminophen] Itching   Propoxyphene Other (See Comments)   Chest/stomach pain Other Reaction(s): ITCHING   Propoxyphene N-acetaminophen Other (See Comments)   Chest/stomach pain        Medication List     STOP taking these medications    ascorbic acid 500 MG tablet Commonly known as: VITAMIN C   aspirin 81 MG tablet   b complex vitamins tablet   cefadroxil 500 MG capsule Commonly known as: DURICEF   Iron (Ferrous Sulfate) 325 (65 Fe) MG Tabs   saccharomyces boulardii 250 MG capsule Commonly known as: FLORASTOR   vitamin E 180 MG (400 UNITS) capsule   Zinc 50 MG Tabs       TAKE these medications    amitriptyline 25 MG tablet Commonly known as: ELAVIL Take 25 mg by mouth at bedtime.   atorvastatin 40 MG tablet  Commonly known as: LIPITOR Take 40 mg by mouth daily.   carvedilol 25 MG tablet Commonly known as: COREG Take 25 mg by mouth 2 (two) times daily with a meal.   cephALEXin 500 MG capsule Commonly known as: KEFLEX Take 1 capsule (500 mg total) by mouth every 8 (eight) hours for 5 days.   cholecalciferol 25 MCG (1000 UNIT)  tablet Commonly known as: VITAMIN D3 Take 1,000 Units by mouth daily.   clonazePAM 0.5 MG tablet Commonly known as: KLONOPIN Take 0.5 mg by mouth at bedtime.   doxycycline 100 MG tablet Commonly known as: VIBRA-TABS Take 1 tablet (100 mg total) by mouth every 12 (twelve) hours for 6 days.   gabapentin 300 MG capsule Commonly known as: NEURONTIN Take 300 mg by mouth 2 (two) times daily.   hydrOXYzine 25 MG capsule Commonly known as: VISTARIL Take 25 mg by mouth at bedtime.   levothyroxine 100 MCG tablet Commonly known as: SYNTHROID Take 100 mcg by mouth daily before breakfast.   loratadine 10 MG tablet Commonly known as: CLARITIN Take 10 mg by mouth daily as needed for allergies.   metFORMIN 500 MG tablet Commonly known as: GLUCOPHAGE Take 500 mg by mouth 2 (two) times daily.   mirabegron ER 50 MG Tb24 tablet Commonly known as: MYRBETRIQ Take 50 mg by mouth daily.   multivitamin with minerals tablet Take 1 tablet by mouth daily.   nitroGLYCERIN 0.4 MG SL tablet Commonly known as: NITROSTAT Place 1 tablet (0.4 mg total) under the tongue every 5 (five) minutes as needed for chest pain.   pantoprazole 40 MG tablet Commonly known as: PROTONIX Take 1 tablet by mouth daily. Take 1 tab daily   telmisartan 80 MG tablet Commonly known as: MICARDIS Take 40 mg by mouth daily. Pt takes half of a tab daily   tiZANidine 4 MG tablet Commonly known as: ZANAFLEX Take 4 mg by mouth at bedtime. PAIN   torsemide 5 MG tablet Commonly known as: DEMADEX Take 5 mg by mouth daily.   zolpidem 5 MG tablet Commonly known as: AMBIEN Take 5 mg by mouth at bedtime as needed for sleep.        Allergies  Allergen Reactions   Tamsulosin Other (See Comments)    Caused uncontrollable movements   Ciprofloxacin Rash   Carisoprodol Itching    Other Reaction(s): Unknown   Oxycodone-Acetaminophen     Other Reaction(s): ITCHING   Penicillins Itching    Pt has tolerated rocephin  IV  Other Reaction(s): ITCHING   Sulfa Antibiotics Other (See Comments)    Unknown.  States as always told had an allergy  Other Reaction(s): Unknown   Acetaminophen Itching    Other Reaction(s): ITCHING   Aspirin Itching and Other (See Comments)    Chest pain, also   Macrodantin [Nitrofurantoin Macrocrystal] Rash   Nsaids Other (See Comments)    Chest pain and stomach pain  Other Reaction(s): Unknown   Percocet [Oxycodone-Acetaminophen] Itching   Propoxyphene Other (See Comments)    Chest/stomach pain  Other Reaction(s): ITCHING   Propoxyphene N-Acetaminophen Other (See Comments)    Chest/stomach pain    Consultations: None   Procedures/Studies: VAS [place] UPPER EXTREMITY VENOUS DUPLEX  Result Date: 01/15/2023 UPPER VENOUS STUDY  Patient Name:  SASKIA AVETISYAN  Date of Exam:   01/14/2023 Medical Rec #: [id number]      Accession #:    [id number] Date of Birth: 1946/01/23      Patient Gender: F Patient Age:  77 years Exam Location:  [facility] Procedure:      VAS [place] UPPER EXTREMITY VENOUS DUPLEX Referring Phys: PARDEEP KHATRI --------------------------------------------------------------------------------  Indications: Pain, and Swelling Risk Factors: Past pregnancy and obesity. Limitations: Body habitus and limited range of motion. Comparison Study: No prior study Performing Technologist: Shona Simpson  Examination Guidelines: A complete evaluation includes B-mode imaging, spectral Doppler, color Doppler, and power Doppler as needed of all accessible portions of each vessel. Bilateral testing is considered an integral part of a complete examination. Limited examinations for reoccurring indications may be performed as noted.  Right Findings: +----------+------------+---------+-----------+----------+-------+ RIGHT     CompressiblePhasicitySpontaneousPropertiesSummary +----------+------------+---------+-----------+----------+-------+ Subclavian    Full       Yes       Yes                       +----------+------------+---------+-----------+----------+-------+  Left Findings: +----------+------------+---------+-----------+----------+-------+ LEFT      CompressiblePhasicitySpontaneousPropertiesSummary +----------+------------+---------+-----------+----------+-------+ IJV           Full       Yes       Yes                      +----------+------------+---------+-----------+----------+-------+ Subclavian    Full       Yes       Yes                      +----------+------------+---------+-----------+----------+-------+ Axillary      Full       Yes       Yes                      +----------+------------+---------+-----------+----------+-------+ Brachial      Full       Yes       Yes                      +----------+------------+---------+-----------+----------+-------+ Radial        Full       Yes       Yes                      +----------+------------+---------+-----------+----------+-------+ Ulnar         Full       Yes       Yes                      +----------+------------+---------+-----------+----------+-------+ Cephalic      Full       Yes       Yes                      +----------+------------+---------+-----------+----------+-------+ Basilic       Full       Yes       Yes                      +----------+------------+---------+-----------+----------+-------+  Summary:  Right: No evidence of thrombosis in the subclavian.  Left: No evidence of deep vein thrombosis in the upper extremity. No evidence of superficial vein thrombosis in the upper extremity.  *See table(s) above for measurements and observations.  Diagnosing physician: Coral Else MD Electronically signed by Coral Else MD on 01/15/2023 at 10:45:11 AM.    Final    DG CHEST PORT 1 VIEW  Result Date: 01/13/2023 CLINICAL DATA:  77 year old female with a history of  post-polio paralysis. Recent hospitalization for pyelonephritis. Malaise, chills. EXAM: PORTABLE  CHEST 1 VIEW COMPARISON:  Portable chest 01/07/2023 and earlier. FINDINGS: Portable AP semi upright view at 0829 hours. More patient rotation to the left, similar somewhat low lung volumes. Mediastinal contours are stable and within normal limits. Visualized tracheal air column is within normal limits. Allowing for portable technique the lungs are clear. No pneumothorax. No acute osseous abnormality identified. IMPRESSION: More rotated to the left.  No acute cardiopulmonary abnormality. Electronically Signed   By: Odessa Fleming M.D.   On: 01/13/2023 08:59   DG Shoulder Left Port  Result Date: 01/11/2023 CLINICAL DATA:  Left shoulder pain. EXAM: LEFT SHOULDER COMPARISON:  None Available. FINDINGS: There is no evidence of fracture or dislocation. Mild osteophyte formation is seen involving the left glenohumeral joint. Widening of glenohumeral joint is noted suggesting ligamentous instability. Soft tissues are unremarkable. IMPRESSION: Mild degenerative joint disease of left glenohumeral joint is noted. Some widening of the joint space is noted as well suggesting ligamentous laxity. No fracture is noted. Electronically Signed   By: Lupita Raider M.D.   On: 01/11/2023 14:29   DG Knee 1-2 Views Left  Result Date: 01/08/2023 CLINICAL DATA:  Left knee pain. EXAM: LEFT KNEE - 1-2 VIEW COMPARISON:  CT left tibia and fibula 10/31/2022 FINDINGS: There is diffuse decreased bone mineralization. Postsurgical changes are seen of screw and cerclage wiring RF of the anterolateral patella. There is again severe medial greater than lateral joint space narrowing, subchondral sclerosis, and peripheral osteophytosis. There is again anterior translation of the tibia with respect to the distal femur. Severe patellofemoral joint space narrowing with large superior osteophytosis. Large posterior femoral condyle degenerative osteophytes. No acute fracture or dislocation. Moderate atherosclerotic calcifications. IMPRESSION: Severe  tricompartmental osteoarthritis of the left knee. Electronically Signed   By: Neita Garnet M.D.   On: 01/08/2023 17:48   DG CHEST PORT 1 VIEW  Result Date: 01/07/2023 CLINICAL DATA:  Shortness of breath and chest pain. EXAM: PORTABLE CHEST 1 VIEW COMPARISON:  Chest radiograph dated 11/11/2022. FINDINGS: Shallow inspiration. No focal consolidation, pleural effusion, or pneumothorax. Stable cardiac silhouette. Atherosclerotic calcification of the aorta. No acute osseous pathology. Degenerative changes of the spine and left shoulder IMPRESSION: No active disease. Electronically Signed   By: Elgie Collard M.D.   On: 01/07/2023 22:13   CT Renal Stone Study  Result Date: 01/07/2023 CLINICAL DATA:  Cramping abdominal pain radiating to the back with foul-smelling urine. Blood in urine. EXAM: CT ABDOMEN AND PELVIS WITHOUT CONTRAST TECHNIQUE: Multidetector CT imaging of the abdomen and pelvis was performed following the standard protocol without IV contrast. RADIATION DOSE REDUCTION: This exam was performed according to the departmental dose-optimization program which includes automated exposure control, adjustment of the mA and/or kV according to patient size and/or use of iterative reconstruction technique. COMPARISON:  CT 11/11/2022 and older FINDINGS: Lower chest: Slight linear opacity lung bases likely scar or atelectasis. No pleural effusion Hepatobiliary: On this non IV contrast exam, grossly preserved hepatic parenchyma only slight nodular contour previous cholecystectomy Pancreas: Unremarkable. No pancreatic ductal dilatation or surrounding inflammatory changes. Spleen: Normal in size without focal abnormality. Adrenals/Urinary Tract: Adrenal glands are preserved. Mild renal atrophy. The right kidney does not have a renal or ureteral stone. No collecting system dilatation. The right ureter has a normal course and caliber down to the bladder. The bladder wall is thickened and there is some stranding.  Please correlate for cystitis. In addition there is mild ectasia  of the left renal collecting system without renal or ureteral stone and adjacent stranding. There is also some asymmetric left perinephric stranding compared to right. This has has a differential. Possibilities would include infection including pyelonephritis. Please correlate for a recently passed stone or other urothelial process as well. Please correlate with clinical presentation. Stomach/Bowel: Underdistended stomach with slight fold thickening. The small and large bowel are nondilated. Scattered stool. Appendix not well seen in the right lower quadrant but no pericecal stranding or fluid. Vascular/Lymphatic: Aortic atherosclerosis. No enlarged abdominal or pelvic lymph nodes. Reproductive: Status post hysterectomy. No adnexal masses. Other: No free air or free fluid.  Mild anasarca. Musculoskeletal: Curvature of the spine. Degenerative changes of the spine and pelvis. Osteopenia. Diffuse fatty muscle atrophy. IMPRESSION: Development wall thickening of the bladder as well as thickening along the course of the urothelium of the left kidney with diffuse adjacent stranding. Slight ectasia of the left renal collecting system without renal stone. Possibilities would include cystitis with a potential pyelonephritis. Please correlate with clinical presentation. If there is concern of the sequela of pyelonephritis, a contrast study may be of some benefit as clinically appropriate otherwise would recommend follow up imaging to exclude other pathology as the etiology of the findings Electronically Signed   By: Karen Kays M.D.   On: 01/07/2023 19:58   IR INJECT DIAG/THERA/INC NEEDLE/CATH/PLC EPI/LUMB/SAC W/IMG  Result Date: 12/27/2022 CLINICAL DATA:  77 year old female with history of multilevel lumbar spondylosis and back pain status post prior left L3-L4 epidural steroid injections on 07/19/2022 and 09/11/2022 with good results. FLUOROSCOPY:  Radiation Exposure Index (as provided by the fluoroscopic device): 81 mGy Kerma PROCEDURE: The procedure, risks, benefits, and alternatives were explained to the patient. Questions regarding the procedure were encouraged and answered. The patient understands and consents to the procedure. LUMBAR EPIDURAL INJECTION: An interlaminar approach was performed on left at L3-L4. The overlying skin was cleansed and anesthetized. A 6 inch 20 gauge epidural needle was advanced using loss-of-resistance technique. DIAGNOSTIC EPIDURAL INJECTION: Injection of Isovue-M 200 shows a good epidural pattern with spread above and below the level of needle placement, primarily on the left no vascular opacification is seen. THERAPEUTIC EPIDURAL INJECTION: Eighty mg of Depo-Medrol mixed with 3 mL 1% lidocaine were instilled. The procedure was well-tolerated, and the patient was discharged thirty minutes following the injection in good condition. COMPLICATIONS: None immediate IMPRESSION: Technically successful epidural injection on the left at L3-L4. Marliss Coots, MD [facility] Electronically Signed   By: Marliss Coots M.D.   On: 12/27/2022 05:56   (Echo, Carotid, EGD, Colonoscopy, ERCP)    Subjective: No complaints  Discharge Exam: Vitals:   01/18/23 2030 01/19/23 0404  BP: (!) 165/67 116/62  Pulse: (!) 56 (!) 57  Resp: 20 18  Temp: 98.2 F (36.8 C) 98.1 F (36.7 C)  SpO2: 98% 97%   Vitals:   01/18/23 1029 01/18/23 1031 01/18/23 2030 01/19/23 0404  BP:  (!) 150/70 (!) 165/67 116/62  Pulse: (!) 48 64 (!) 56 (!) 57  Resp:  18 20 18   Temp:  98.4 F (36.9 C) 98.2 F (36.8 C) 98.1 F (36.7 C)  TempSrc:  Oral Oral Oral  SpO2:  95% 98% 97%  Weight:      Height:        General: Pt is alert, awake, not in acute distress Cardiovascular: RRR, S1/S2 +, no rubs, no gallops Respiratory: CTA bilaterally, no wheezing, no rhonchi Abdominal: Soft, NT, ND,  bowel  sounds + Extremities: no edema, no cyanosis    The results of significant diagnostics from this hospitalization (including imaging, microbiology, ancillary and laboratory) are listed below for reference.     Microbiology: Recent Results (from the past 240 hour(s))  Urine Culture     Status: None   Collection Time: 01/13/23  2:02 AM   Specimen: Urine, Random  Result Value Ref Range Status   Specimen Description   Final    URINE, RANDOM Performed at [facility], [address], [place], [place] [address]    Special Requests   Final    NONE Reflexed from [id number] Performed at [facility], [address] [place], [place] [address]    Culture   Final    NO GROWTH Performed at [facility], [address] [place], [place] [address]    Report Status 01/14/2023 FINAL  Final  Blood culture (routine x 2)     Status: None   Collection Time: 01/13/23  3:24 AM   Specimen: BLOOD RIGHT HAND  Result Value Ref Range Status   Specimen Description BLOOD RIGHT HAND  Final   Special Requests   Final    BOTTLES DRAWN AEROBIC AND ANAEROBIC Blood Culture adequate volume   Culture   Final    NO GROWTH 5 DAYS Performed at [facility], [address] [place], [place] [address]    Report Status 01/18/2023 FINAL  Final  Blood culture (routine x 2)     Status: None   Collection Time: 01/13/23  3:39 AM   Specimen: BLOOD LEFT HAND  Result Value Ref Range Status   Specimen Description BLOOD LEFT HAND  Final   Special Requests   Final    BOTTLES DRAWN AEROBIC AND ANAEROBIC Blood Culture adequate volume   Culture   Final    NO GROWTH 5 DAYS Performed at [facility], [address], [place], [place] [address]    Report Status 01/18/2023 FINAL  Final     Labs: BNP (last 3 results) Recent Labs    10/27/22 0544 11/11/22 0206 01/07/23 2256  BNP 49.7 40.4 101.2*   Basic Metabolic Panel: Recent Labs  Lab 01/13/23 0147 01/14/23 0356  01/15/23 0729 01/16/23 0409 01/17/23 0404  NA 130* 134* 129* 132* 129*  K 3.8 3.6 3.5 3.4* 3.8  CL 96* 100 96* 96* 95*  CO2 23 23 25 25 24   GLUCOSE 189* 177* 150* 185* 177*  BUN 9 8 8  7* 13  CREATININE 0.37* 0.42* 0.41* 0.42* 0.35*  CALCIUM 8.8* 8.1* 8.2* 8.4* 8.0*  MG  --   --  1.0* 1.3* 1.3*  PHOS  --   --  3.8 2.8 3.4   Liver Function Tests: No results for input(s): "AST", "ALT", "ALKPHOS", "BILITOT", "PROT", "ALBUMIN" in the last 168 hours. No results for input(s): "LIPASE", "AMYLASE" in the last 168 hours. No results for input(s): "AMMONIA" in the last 168 hours. CBC: Recent Labs  Lab 01/13/23 0147 01/14/23 0356 01/15/23 0729 01/16/23 0409 01/17/23 0404  WBC 14.6* 13.1* 13.0* 13.0* 10.7*  NEUTROABS 9.2*  --   --   --   --   HGB 11.2* 10.4* 10.3* 10.9* 9.6*  HCT 33.8* 32.9* 32.1* 33.2* 30.2*  MCV 91.1 94.3 89.9 92.0 92.4  PLT 399 364 383 415* 402*   Cardiac Enzymes: No results for input(s): "CKTOTAL", "CKMB", "CKMBINDEX", "TROPONINI" in the last 168 hours. BNP: Invalid input(s): "POCBNP" CBG: Recent  Labs  Lab 01/18/23 0750 01/18/23 1210 01/18/23 1819 01/18/23 2027 01/19/23 0735  GLUCAP 138* 144* 151* 156* 143*   D-Dimer No results for input(s): "DDIMER" in the last 72 hours. Hgb A1c No results for input(s): "HGBA1C" in the last 72 hours. Lipid Profile No results for input(s): "CHOL", "HDL", "LDLCALC", "TRIG", "CHOLHDL", "LDLDIRECT" in the last 72 hours. Thyroid function studies No results for input(s): "TSH", "T4TOTAL", "T3FREE", "THYROIDAB" in the last 72 hours.  Invalid input(s): "FREET3" Anemia work up No results for input(s): "VITAMINB12", "FOLATE", "FERRITIN", "TIBC", "IRON", "RETICCTPCT" in the last 72 hours. Urinalysis    Component Value Date/Time   COLORURINE YELLOW 01/13/2023 0202   APPEARANCEUR TURBID (A) 01/13/2023 0202   LABSPEC 1.009 01/13/2023 0202   PHURINE 6.0 01/13/2023 0202   GLUCOSEU NEGATIVE 01/13/2023 0202   HGBUR SMALL (A)  01/13/2023 0202   BILIRUBINUR NEGATIVE 01/13/2023 0202   BILIRUBINUR negative 09/28/2020 1544   KETONESUR NEGATIVE 01/13/2023 0202   PROTEINUR NEGATIVE 01/13/2023 0202   UROBILINOGEN 0.2 09/28/2020 1544   UROBILINOGEN 0.2 09/01/2013 2145   NITRITE NEGATIVE 01/13/2023 0202   LEUKOCYTESUR LARGE (A) 01/13/2023 0202   Sepsis Labs Recent Labs  Lab 01/14/23 0356 01/15/23 0729 01/16/23 0409 01/17/23 0404  WBC 13.1* 13.0* 13.0* 10.7*   Microbiology Recent Results (from the past 240 hour(s))  Urine Culture     Status: None   Collection Time: 01/13/23  2:02 AM   Specimen: Urine, Random  Result Value Ref Range Status   Specimen Description   Final    URINE, RANDOM Performed at [facility], [address], [place], [place] [address]    Special Requests   Final    NONE Reflexed from [id number] Performed at [facility], [address] [place], [place] [address]    Culture   Final    NO GROWTH Performed at [facility], [address], [place], [place] [address]    Report Status 01/14/2023 FINAL  Final  Blood culture (routine x 2)     Status: None   Collection Time: 01/13/23  3:24 AM   Specimen: BLOOD RIGHT HAND  Result Value Ref Range Status   Specimen Description BLOOD RIGHT HAND  Final   Special Requests   Final    BOTTLES DRAWN AEROBIC AND ANAEROBIC Blood Culture adequate volume   Culture   Final    NO GROWTH 5 DAYS Performed at [facility], [address] [place], [place] [address]    Report Status 01/18/2023 FINAL  Final  Blood culture (routine x 2)     Status: None   Collection Time: 01/13/23  3:39 AM   Specimen: BLOOD LEFT HAND  Result Value Ref Range Status   Specimen Description BLOOD LEFT HAND  Final   Special Requests   Final    BOTTLES DRAWN AEROBIC AND ANAEROBIC Blood Culture adequate volume   Culture   Final    NO GROWTH 5 DAYS Performed at [facility], [address] [place], [place] [address]     Report Status 01/18/2023 FINAL  Final    SIGNED:   Marinda Elk, MD  [facility] 01/19/2023, 8:18 AM Pager   If 7PM-7AM, please contact night-coverage [email or web address] Password [email or web address]

## 2023-01-19 NOTE — Care Management Important Message (Signed)
Important Message  Patient Details IM Letter given Name: [name] MRN: [id number] Date of Birth: 1945-07-08   Medicare Important Message Given:  Yes     Caren Macadam 01/19/2023, 11:44 AM

## 2023-01-19 NOTE — Evaluation (Signed)
Physical Therapy Evaluation Patient Details Name: [name] MRN: [id number] DOB: 03/02/1946 Today's Date: 01/19/2023  History of Present Illness  Patient is a 77 year old female who presented back to the hospital with chills rigors, malaise and weakness after being discharged on 8/29 with over 24 hours without recommended antibiotics. PMH: postpolio paralysis, diastolic CHF, obesity, chronic back pain, hypertension, well-controlled type 2 diabetes, who was recently discharged on 8/29 after admission for pyelonephritis  Clinical Impression  Pt from home with spouse and caregiver 6 hrs/day for 5 days a week. Pt needing assist with self care tasks, completes sliding transfers from bed to [facility] without back and to electric w/c. Pt and spouse able to verbalize technique and sequencing, then demo good safety with transfer from bed to electric w/c.  Pt with fair sitting balance, initially needing assist to maintain then CGA. Pt needing significant assist with bed mobility, max A+2 with cues for hand placement and using bedrails. Pt completes lateral posterior scoot transfer with max A from spouse, therapist supv for safety. Educated spouse on body mechanics and positioning, verbalizes understanding. Noted discharge orders in; recommend post acute f/u with HHPT for continued strengthening and transfer training.      If plan is discharge home, recommend the following: A lot of help with bathing/dressing/bathroom;A lot of help with walking and/or transfers;Help with stairs or ramp for entrance;Assist for transportation;Assistance with cooking/housework   Can travel by private vehicle        Equipment Recommendations None recommended by PT  Recommendations for Other Services       Functional Status Assessment Patient has not had a recent decline in their functional status     Precautions / Restrictions Precautions Precautions: Fall Precaution Comments: LE paralysis, L knee painful, says it was not  bending, now the knee bends, H/O L patella  fracture 2015 Restrictions RLE Weight Bearing: Non weight bearing LLE Weight Bearing: Non weight bearing      Mobility  Bed Mobility Overal bed mobility: Needs Assistance Bed Mobility: Rolling, Supine to Sit Rolling: Max assist, +2 for physical assistance, +2 for safety/equipment   Supine to sit: Max assist, +2 for physical assistance, +2 for safety/equipment     General bed mobility comments: max A +2 for assist and safety to roll R/L, pt fearful of falling needing much encouragement to complete task; pt pulls on spouse's hand to upright trunk into sitting, needing additinal assist to fully upright trunk and initial trunk support while gaining sitting balance    Transfers Overall transfer level: Needs assistance   Transfers: Bed to chair/wheelchair/BSC             General transfer comment: pt and spouse decline slideboard transfer education, able to verbally explain transfers and demo with good safety. Pt in long sitting on bed, rotates 90 degrees while maintaining long sitting on bed, spouse parks electric w/c with seat level to mattress, spouse slides pt posteriorly from mattress to electric w/c with max A with 2nd person supv for safety; provided education regarding body mechanics and position and pt and spouse verbalize understanding    Ambulation/Gait               General Gait Details: electric w/c at baseline  Stairs            Wheelchair Mobility     Tilt Bed    Modified Rankin (Stroke Patients Only)       Balance Overall balance assessment: Needs assistance Sitting-balance support: Feet  supported Sitting balance-Leahy Scale: Fair Sitting balance - Comments: initial assist to maintain static sitting, improves with time, CGA for transfers                                     Pertinent Vitals/Pain Pain Assessment Pain Assessment: Faces Faces Pain Scale: Hurts even more Pain  Location: back and hips Pain Descriptors / Indicators: Discomfort, Grimacing Pain Intervention(s): Limited activity within patient's tolerance, Monitored during session, Repositioned, RN gave pain meds during session    Home Living Family/patient expects to be discharged to:: Private residence Living Arrangements: Spouse/significant other Available Help at Discharge: Family;Available 24 hours/day Type of Home: House Home Access: Ramped entrance       Home Layout: One level Home Equipment: Wheelchair - Runner, broadcasting/film/video;Hospital bed;Wheelchair - power Additional Comments: lift chair, hoyer lift    Prior Function Prior Level of Function : Needs assist       Physical Assist : Mobility (physical);ADLs (physical) Mobility (physical): Bed mobility;Transfers ADLs (physical): Bathing;Dressing;Toileting;IADLs Mobility Comments: patient scoots forward onto bari BSC backwards (legs pass through the back bar) then scoots back into WC/scooter. Sleeps in recliner, spouse  placed a tub in carport and patient is lifted down into it with [employer] lift, has adaptive truck that [facility] will lift into ADLs Comments: spouse reports sponge bath, using hoyer lift for some transfers     Extremity/Trunk Assessment   Upper Extremity Assessment Upper Extremity Assessment: Defer to OT evaluation    Lower Extremity Assessment RLE Deficits / Details: atrophy, minimal hip rotation, plantar flexor contracture LLE Deficits / Details: atrophy, minimal hip rotation, plantar flexor contracture    Cervical / Trunk Assessment Cervical / Trunk Assessment: Kyphotic  Communication   Communication Communication: No apparent difficulties  Cognition Arousal: Alert Behavior During Therapy: WFL for tasks assessed/performed Overall Cognitive Status: Within Functional Limits for tasks assessed                                 General Comments: spouse present        General Comments       Exercises     Assessment/Plan    PT Assessment All further PT needs can be met in the next venue of care  PT Problem List Decreased strength;Decreased mobility;Decreased range of motion;Obesity;Decreased activity tolerance;Decreased balance;Pain       PT Treatment Interventions      PT Goals (Current goals can be found in the Care Plan section)  Acute Rehab PT Goals Patient Stated Goal: "I'm ready to go home" PT Goal Formulation: With patient/family Time For Goal Achievement: 02/02/23 Potential to Achieve Goals: Fair    Frequency       Co-evaluation               AM-PAC PT "6 Clicks" Mobility  Outcome Measure Help needed turning from your back to your side while in a flat bed without using bedrails?: Total Help needed moving from lying on your back to sitting on the side of a flat bed without using bedrails?: Total Help needed moving to and from a bed to a chair (including a wheelchair)?: Total Help needed standing up from a chair using your arms (e.g., wheelchair or bedside chair)?: Total Help needed to walk in hospital room?: Total Help needed climbing 3-5 steps with a railing? : Total 6 Click Score:  6    End of Session   Activity Tolerance: Patient tolerated treatment well Patient left: with call bell/phone within reach;with nursing/sitter in room;with family/visitor present (in personal electric w/c) Nurse Communication: Mobility status PT Visit Diagnosis: Other abnormalities of gait and mobility (R26.89);Pain Pain - part of body:  (back and hips)    Time: 6440-3474 PT Time Calculation (min) (ACUTE ONLY): 33 min   Charges:   PT Evaluation $PT Eval Low Complexity: 1 Low PT Treatments $Therapeutic Activity: 8-22 mins PT General Charges $$ ACUTE PT VISIT: 1 Visit         Tori Jeffery Gammell PT, DPT 01/19/23, 12:23 PM

## 2023-01-19 NOTE — TOC Transition Note (Signed)
Transition of Care [facility]) - CM/SW Discharge Note   Patient Details  Name: [name] MRN: [id number] Date of Birth: 24-Jan-1946  Transition of Care [facility]) CM/SW Contact:  Howell Rucks, RN Phone Number: 01/19/2023, 12:57 PM   Clinical Narrative:  PT eval completed, recommendation HH PT, met with pt and spouse at  bedside, agreeable, prefers [facility]. NCM outreaced to [facility] rep-Cory, accepted for [facility] PT, added to AVS. No further TOC needs identified.     Final next level of care: Home w Home Health Services Barriers to Discharge: Barriers Resolved   Patient Goals and CMS Choice CMS Medicare.gov Compare Post Acute Care list provided to:: Patient Choice offered to / list presented to : Patient  Discharge Placement                         Discharge Plan and Services Additional resources added to the After Visit Summary for                            [facility] Arranged: PT HH Agency: [facility] Date [facility] Agency Contacted: 01/19/23 Time HH Agency Contacted: 1257 Representative spoke with at [facility] Agency: Kandee Keen  Social Determinants of Health (SDOH) Interventions SDOH Screenings   Food Insecurity: Patient Declined (01/13/2023)  Housing: Patient Declined (01/13/2023)  Transportation Needs: Patient Declined (01/13/2023)  Utilities: Patient Declined (01/13/2023)  Depression (PHQ2-9): Low Risk  (05/30/2021)  Tobacco Use: Low Risk  (01/13/2023)     Readmission Risk Interventions    01/11/2023   12:56 PM 10/26/2022   12:31 PM  Readmission Risk Prevention Plan  Transportation Screening Complete Complete  PCP or Specialist Appt within 5-7 Days  Complete  PCP or Specialist Appt within 3-5 Days Complete   Home Care Screening  Complete  Medication Review (RN CM)  Complete  HRI or Home Care Consult Complete   Social Work Consult for Recovery Care Planning/Counseling Complete   Palliative Care Screening Not Applicable   Medication Review [employer]) Complete

## 2023-01-19 NOTE — Plan of Care (Signed)

## 2023-04-04 ENCOUNTER — Emergency Department
Admission: EM | Admit: 2023-04-04 | Discharge: 2023-04-04 | Disposition: A | Discharge status: Home / Self Care | Payer: Medicare Other | Attending: Emergency Medicine | Admitting: Emergency Medicine

## 2023-04-04 ENCOUNTER — Other Ambulatory Visit: Payer: Self-pay

## 2023-04-04 DIAGNOSIS — E119 Type 2 diabetes mellitus without complications: Secondary | ICD-10-CM | POA: Diagnosis not present

## 2023-04-04 DIAGNOSIS — I509 Heart failure, unspecified: Secondary | ICD-10-CM | POA: Diagnosis not present

## 2023-04-04 DIAGNOSIS — Z23 Encounter for immunization: Secondary | ICD-10-CM | POA: Diagnosis not present

## 2023-04-04 DIAGNOSIS — E039 Hypothyroidism, unspecified: Secondary | ICD-10-CM | POA: Diagnosis not present

## 2023-04-04 DIAGNOSIS — I110 Hypertensive heart disease with heart failure: Secondary | ICD-10-CM | POA: Insufficient documentation

## 2023-04-04 DIAGNOSIS — W548XXA Other contact with dog, initial encounter: Secondary | ICD-10-CM | POA: Insufficient documentation

## 2023-04-04 DIAGNOSIS — S81812A Laceration without foreign body, left lower leg, initial encounter: Secondary | ICD-10-CM | POA: Insufficient documentation

## 2023-04-04 MED ORDER — TETANUS-DIPHTH-ACELL PERTUSSIS 5-2.5-18.5 LF-MCG/0.5 IM SUSY
0.5000 mL | PREFILLED_SYRINGE | Freq: Once | INTRAMUSCULAR | Status: AC
  Administered 2023-04-04: 0.5 mL via INTRAMUSCULAR
  Filled 2023-04-04: qty 0.5

## 2023-04-04 MED ORDER — LIDOCAINE-EPINEPHRINE 1 %-1:100000 IJ SOLN
20.0000 mL | Freq: Once | INTRAMUSCULAR | Status: AC
  Administered 2023-04-04: 20 mL via INTRADERMAL
  Filled 2023-04-04: qty 1

## 2023-04-04 NOTE — ED Provider Notes (Signed)
[facility] Emergency Department Provider Note     Event Date/Time   First MD Initiated Contact with Patient 04/04/23 2119     (approximate)   History   Laceration  HPI  [name] is a 77 y.o. female with a history of paraplegia due to post-polio syndrome, HTN, DM, CHF, hypothyroidism, presents to the ED for evaluation via EMS.  She [place] from a restaurant after her dog jumped on her creating a large scratch to the left leg.  She presents to the ED for evaluation and management of her wound.  Dog reportedly up-to-date on vaccines.  Patient denies current tetanus status.  Physical Exam   Triage Vital Signs: ED Triage Vitals  Encounter Vitals Group     BP 04/04/23 1827 (!) 123/57     Systolic BP Percentile --      Diastolic BP Percentile --      Pulse Rate 04/04/23 1827 (!) 57     Resp 04/04/23 1824 18     Temp 04/04/23 1825 98.4 F (36.9 C)     Temp src --      SpO2 04/04/23 1827 96 %     Weight 04/04/23 1825 180 lb (81.6 kg)     Height 04/04/23 1825 5' (1.524 m)     Head Circumference --      Peak Flow --      Pain Score 04/04/23 1824 8     Pain Loc --      Pain Education --      Exclude from Growth Chart --     Most recent vital signs: Vitals:   04/04/23 1825 04/04/23 1827  BP:  (!) 123/57  Pulse:  (!) 57  Resp:    Temp: 98.4 F (36.9 C)   SpO2:  96%    General Awake, no distress. NAD HEENT NCAT. PERRL. EOMI. No rhinorrhea. Mucous membranes are moist.  CV:  Good peripheral perfusion. RRR RESP:  Normal effort.  ABD:  No distention.  MSK:  Left lower extremity with an anterior shin laceration.  She presents with a triangular laceration measuring approximately 2 cm on each side with the intact clot over the wound bed.  Some minimal active bleeding is noted.  No other injury reported at this time.   ED Results / Procedures / Treatments   Labs (all labs ordered are listed, but only abnormal results are displayed) Labs  Reviewed - No data to display   EKG   RADIOLOGY No results found.   PROCEDURES:  Critical Care performed: No  ..Laceration Repair  Date/Time: 04/04/2023 9:05 PM  Performed by: Lissa Hoard, PA-C Authorized by: Lissa Hoard, PA-C   Consent:    Consent obtained:  Verbal   Consent given by:  Patient   Risks, benefits, and alternatives were discussed: yes     Risks discussed:  Pain and poor wound healing Universal protocol:    Site/side marked: yes     Patient identity confirmed:  Verbally with patient Anesthesia:    Anesthesia method:  Local infiltration   Local anesthetic:  Lidocaine 1% WITH epi Laceration details:    Location:  Leg   Leg location:  L lower leg   Length (cm):  4   Depth (mm):  5 Pre-procedure details:    Preparation:  Patient was prepped and draped in usual sterile fashion Exploration:    Hemostasis achieved with:  Epinephrine   Contaminated: no   Treatment:  Area cleansed with:  Saline and povidone-iodine   Amount of cleaning:  Standard   Irrigation solution:  Sterile saline   Irrigation volume:  10   Irrigation method:  Syringe   Debridement:  None   Undermining:  None   Scar revision: no   Skin repair:    Repair method:  Sutures   Suture size:  4-0   Suture material:  Nylon   Suture technique:  Simple interrupted   Number of sutures:  4 Approximation:    Approximation:  Close Repair type:    Repair type:  Simple Post-procedure details:    Dressing:  Non-adherent dressing and bulky dressing   Procedure completion:  Tolerated well, no immediate complications    MEDICATIONS ORDERED IN ED: Medications  Tdap (BOOSTRIX) injection 0.5 mL (0.5 mLs Intramuscular Given 04/04/23 2134)  lidocaine-EPINEPHrine (XYLOCAINE W/EPI) 1 %-1:100000 (with pres) injection 20 mL (20 mLs Intradermal Given by Other 04/04/23 2155)     IMPRESSION / MDM / ASSESSMENT AND PLAN / ED COURSE  I reviewed the triage vital signs and the  nursing notes.                              Differential diagnosis includes, but is not limited to, abrasion, laceration, skin tear, contusion, hematoma  Patient's presentation is most consistent with acute, uncomplicated illness.  Patient's diagnosis is consistent with left lower extremity. Patient will be discharged home with prescriptions for ***. Patient is to follow up with *** as needed or otherwise directed. Patient is given ED precautions to return to the ED for any worsening or new symptoms.     FINAL CLINICAL IMPRESSION(S) / ED DIAGNOSES   Final diagnoses:  Laceration of left lower extremity, initial encounter     Rx / DC Orders   ED Discharge Orders     None        Note:  This document was prepared using Dragon voice recognition software and may include unintentional dictation errors.

## 2023-04-04 NOTE — ED Notes (Signed)
Discharge instructions reviewed with pt / spouse.   Encouraged to seek suture removal and wound assessment 7-10 days from now.   Alert, oriented and escorted to vehicle in pt home motorized wheelchair.   New brief placed. [place] footwear provided.   Declines discharge vital signs.

## 2023-04-04 NOTE — Discharge Instructions (Signed)
Keep the wound clean, dry, and covered.  See your primary provider for suture removal in 7 to 10 days.

## 2023-04-04 NOTE — ED Triage Notes (Signed)
Pt to ED [facility] from home for laceration to left leg from dog. States dog scratched her. UTD on vaccines.  Hx polio Leg wrapped up, bleeding controlled

## 2023-06-03 ENCOUNTER — Emergency Department (HOSPITAL_COMMUNITY): Payer: Medicare Other

## 2023-06-03 ENCOUNTER — Encounter (HOSPITAL_COMMUNITY): Payer: Self-pay | Admitting: *Deleted

## 2023-06-03 ENCOUNTER — Other Ambulatory Visit: Payer: Self-pay

## 2023-06-03 ENCOUNTER — Emergency Department (HOSPITAL_COMMUNITY)
Admission: EM | Admit: 2023-06-03 | Discharge: 2023-06-03 | Disposition: A | Discharge status: Home / Self Care | Payer: Medicare Other | Attending: Emergency Medicine | Admitting: Emergency Medicine

## 2023-06-03 DIAGNOSIS — D72829 Elevated white blood cell count, unspecified: Secondary | ICD-10-CM | POA: Insufficient documentation

## 2023-06-03 DIAGNOSIS — E119 Type 2 diabetes mellitus without complications: Secondary | ICD-10-CM | POA: Insufficient documentation

## 2023-06-03 DIAGNOSIS — I509 Heart failure, unspecified: Secondary | ICD-10-CM | POA: Diagnosis not present

## 2023-06-03 DIAGNOSIS — Z7984 Long term (current) use of oral hypoglycemic drugs: Secondary | ICD-10-CM | POA: Diagnosis not present

## 2023-06-03 DIAGNOSIS — R059 Cough, unspecified: Secondary | ICD-10-CM | POA: Diagnosis present

## 2023-06-03 DIAGNOSIS — I6782 Cerebral ischemia: Secondary | ICD-10-CM | POA: Diagnosis not present

## 2023-06-03 DIAGNOSIS — G14 Postpolio syndrome: Secondary | ICD-10-CM | POA: Diagnosis not present

## 2023-06-03 DIAGNOSIS — Z886 Allergy status to analgesic agent status: Secondary | ICD-10-CM | POA: Diagnosis not present

## 2023-06-03 DIAGNOSIS — I110 Hypertensive heart disease with heart failure: Secondary | ICD-10-CM | POA: Insufficient documentation

## 2023-06-03 DIAGNOSIS — R600 Localized edema: Secondary | ICD-10-CM | POA: Insufficient documentation

## 2023-06-03 DIAGNOSIS — R519 Headache, unspecified: Secondary | ICD-10-CM | POA: Insufficient documentation

## 2023-06-03 DIAGNOSIS — J3489 Other specified disorders of nose and nasal sinuses: Secondary | ICD-10-CM | POA: Diagnosis not present

## 2023-06-03 DIAGNOSIS — K219 Gastro-esophageal reflux disease without esophagitis: Secondary | ICD-10-CM | POA: Diagnosis not present

## 2023-06-03 DIAGNOSIS — J069 Acute upper respiratory infection, unspecified: Secondary | ICD-10-CM

## 2023-06-03 DIAGNOSIS — G4730 Sleep apnea, unspecified: Secondary | ICD-10-CM | POA: Diagnosis not present

## 2023-06-03 DIAGNOSIS — Z20822 Contact with and (suspected) exposure to covid-19: Secondary | ICD-10-CM | POA: Insufficient documentation

## 2023-06-03 DIAGNOSIS — I700 Atherosclerosis of aorta: Secondary | ICD-10-CM | POA: Diagnosis not present

## 2023-06-03 DIAGNOSIS — R0602 Shortness of breath: Secondary | ICD-10-CM | POA: Insufficient documentation

## 2023-06-03 DIAGNOSIS — Z79899 Other long term (current) drug therapy: Secondary | ICD-10-CM | POA: Insufficient documentation

## 2023-06-03 LAB — BASIC METABOLIC PANEL
Anion gap: 9 (ref 5–15)
BUN: 9 mg/dL (ref 8–23)
CO2: 27 mmol/L (ref 22–32)
Calcium: 9.3 mg/dL (ref 8.9–10.3)
Chloride: 95 mmol/L — ABNORMAL LOW (ref 98–111)
Creatinine, Ser: 0.57 mg/dL (ref 0.44–1.00)
GFR, Estimated: >60 mL/min (ref >60)
Glucose, Bld: 107 mg/dL — ABNORMAL HIGH (ref 70–99)
Potassium: 4.3 mmol/L (ref 3.5–5.1)
Sodium: 131 mmol/L — ABNORMAL LOW (ref 135–145)

## 2023-06-03 LAB — CBC WITH DIFFERENTIAL/PLATELET
Abs Immature Granulocytes: 0.06 10*3/uL (ref 0.00–0.07)
Basophils Absolute: 0.1 10*3/uL (ref 0.0–0.1)
Basophils Relative: 1 %
Eosinophils Absolute: 0.8 10*3/uL — ABNORMAL HIGH (ref 0.0–0.5)
Eosinophils Relative: 6 %
HCT: 41.1 % (ref 36.0–46.0)
Hemoglobin: 13.7 g/dL (ref 12.0–15.0)
Immature Granulocytes: 0 %
Lymphocytes Relative: 23 %
Lymphs Abs: 3.2 10*3/uL (ref 0.7–4.0)
MCH: 30.9 pg (ref 26.0–34.0)
MCHC: 33.3 g/dL (ref 30.0–36.0)
MCV: 92.6 fL (ref 80.0–100.0)
Monocytes Absolute: 1.2 10*3/uL — ABNORMAL HIGH (ref 0.1–1.0)
Monocytes Relative: 9 %
Neutro Abs: 8.4 10*3/uL — ABNORMAL HIGH (ref 1.7–7.7)
Neutrophils Relative %: 61 %
Platelets: 426 10*3/uL — ABNORMAL HIGH (ref 150–400)
RBC: 4.44 MIL/uL (ref 3.87–5.11)
RDW: 14.6 % (ref 11.5–15.5)
WBC: 13.8 10*3/uL — ABNORMAL HIGH (ref 4.0–10.5)
nRBC: 0 % (ref 0.0–0.2)

## 2023-06-03 LAB — RESP PANEL BY RT-PCR (RSV, FLU A&B, COVID)  RVPGX2
Influenza A by PCR: NEGATIVE
Influenza B by PCR: NEGATIVE
Resp Syncytial Virus by PCR: NEGATIVE
SARS Coronavirus 2 by RT PCR: NEGATIVE

## 2023-06-03 LAB — BRAIN NATRIURETIC PEPTIDE: B Natriuretic Peptide: 81.1 pg/mL (ref 0.0–100.0)

## 2023-06-03 MED ORDER — TRAMADOL HCL 50 MG PO TABS
50.0000 mg | ORAL_TABLET | Freq: Once | ORAL | Status: AC
  Administered 2023-06-03: 50 mg via ORAL
  Filled 2023-06-03: qty 1

## 2023-06-03 MED ORDER — KETOROLAC TROMETHAMINE 15 MG/ML IJ SOLN: 15.0000 mg | Freq: Once | INTRAMUSCULAR | Status: DC

## 2023-06-03 MED ORDER — ACETAMINOPHEN 500 MG PO TABS
1000.0000 mg | ORAL_TABLET | Freq: Once | ORAL | Status: AC
  Administered 2023-06-03: 1000 mg via ORAL
  Filled 2023-06-03: qty 2

## 2023-06-03 MED ORDER — ALBUTEROL SULFATE HFA 108 (90 BASE) MCG/ACT IN AERS
1.0000 | INHALATION_SPRAY | Freq: Once | RESPIRATORY_TRACT | Status: AC
  Administered 2023-06-03: 2 via RESPIRATORY_TRACT
  Filled 2023-06-03: qty 6.7

## 2023-06-03 MED ORDER — ALBUTEROL SULFATE HFA 108 (90 BASE) MCG/ACT IN AERS: 1.0000 | INHALATION_SPRAY | Freq: Four times a day (QID) | RESPIRATORY_TRACT | 0 refills | Status: DC | PRN

## 2023-06-03 NOTE — ED Provider Notes (Signed)
[place] EMERGENCY DEPARTMENT AT [facility] Provider Note   CSN: [id number] Arrival date & time: 06/03/23  1805     History  Chief Complaint  Patient presents with   Cough    [name] is a 78 y.o. female with past medical history of CHF, sleep apnea, postpolio syndrome, migraines, type 2 diabetes, GERD, hypertension presenting to emergency room with 2 weeks of cough and concern for pneumonia.  Patient and family member in room reports cough is productive.  They report when cough started she was put on doxycycline for 5 days.  Cough did not improve.  She has not noted any chest pain, shortness of breath fevers chills nausea vomiting or diarrhea at home.  She also has a headache that she rates as central and 10 out of 10.  She reports this came on gradually however she does not typically have a "migraine" headaches.  She has not tried anything for headache.  No blurry vision or new neurological deficit.   Cough      Home Medications Prior to Admission medications   Medication Sig Start Date End Date Taking? Authorizing Provider  amitriptyline (ELAVIL) 25 MG tablet Take 25 mg by mouth at bedtime.    [provider]  atorvastatin (LIPITOR) 40 MG tablet Take 40 mg by mouth daily.    [provider]  carvedilol (COREG) 25 MG tablet Take 25 mg by mouth 2 (two) times daily with a meal.    [provider]  cholecalciferol (VITAMIN D3) 25 MCG (1000 UNIT) tablet Take 1,000 Units by mouth daily.    [provider]  clonazePAM (KLONOPIN) 0.5 MG tablet Take 0.5 mg by mouth at bedtime.    [provider]  gabapentin (NEURONTIN) 300 MG capsule Take 300 mg by mouth 2 (two) times daily.    [provider]  hydrOXYzine (VISTARIL) 25 MG capsule Take 25 mg by mouth at bedtime. 12/31/22   [provider]  levothyroxine (SYNTHROID, LEVOTHROID) 100 MCG tablet Take 100 mcg by mouth daily before breakfast.    [provider]  loratadine (CLARITIN) 10 MG tablet Take 10 mg by mouth daily as needed for allergies. 12/17/19   [provider]  metFORMIN (GLUCOPHAGE) 500 MG tablet Take 500 mg by mouth 2 (two) times daily.    [provider]  mirabegron ER (MYRBETRIQ) 50 MG TB24 tablet Take 50 mg by mouth daily.    [provider]  Multiple Vitamins-Minerals (MULTIVITAMIN WITH MINERALS) tablet Take 1 tablet by mouth daily.    [provider]  nitroGLYCERIN (NITROSTAT) 0.4 MG SL tablet Place 1 tablet (0.4 mg total) under the tongue every 5 (five) minutes as needed for chest pain. 12/07/22   Alver Sorrow, NP  pantoprazole (PROTONIX) 40 MG tablet Take 1 tablet by mouth daily. Take 1 tab daily 12/21/14   [provider]  telmisartan (MICARDIS) 80 MG tablet Take 40 mg by mouth daily. Pt takes half of a tab daily 12/14/14   [provider]  tiZANidine (ZANAFLEX) 4 MG tablet Take 4 mg by mouth at bedtime. PAIN    [provider]  torsemide (DEMADEX) 5 MG tablet Take 5 mg by mouth daily.    [provider]  zolpidem (AMBIEN) 5 MG tablet Take 5 mg by mouth at bedtime as needed for sleep.    [provider]      Allergies    Tamsulosin, Ciprofloxacin, Carisoprodol, Oxycodone-acetaminophen, Penicillins, Sulfa antibiotics, Acetaminophen,  Aspirin, Macrodantin [nitrofurantoin macrocrystal], Nsaids, Percocet [oxycodone-acetaminophen], Propoxyphene, and Propoxyphene n-acetaminophen    Review of Systems   Review of Systems  Respiratory:  Positive for cough.     Physical Exam Updated Vital Signs BP (!) 168/79 (BP Location: Right Arm)   Pulse (!) 58   Temp 98.3 F (36.8 C) (Oral)   Resp 14   Wt 81.6 kg   SpO2 99%   BMI 35.13 kg/m  Physical Exam Vitals and nursing note reviewed.  Constitutional:      General: She is not in acute distress.    Appearance: She is not toxic-appearing.  HENT:     Head: Normocephalic and atraumatic.  Eyes:      General: No scleral icterus.    Conjunctiva/sclera: Conjunctivae normal.  Cardiovascular:     Rate and Rhythm: Normal rate and regular rhythm.     Pulses: Normal pulses.     Heart sounds: Normal heart sounds.  Pulmonary:     Effort: Pulmonary effort is normal. No respiratory distress.     Breath sounds: Rhonchi present. No wheezing.  Chest:     Chest wall: No tenderness.  Abdominal:     General: Abdomen is flat. Bowel sounds are normal.     Palpations: Abdomen is soft.     Tenderness: There is no abdominal tenderness.  Musculoskeletal:     Right lower leg: Edema present.     Left lower leg: Edema present.     Comments: Has bilateral lower extremity edema which she reports is baseline for her.  Skin:    General: Skin is warm and dry.     Findings: No lesion.  Neurological:     General: No focal deficit present.     Mental Status: She is alert and oriented to person, place, and time. Mental status is at baseline.     ED Results / Procedures / Treatments   Labs (all labs ordered are listed, but only abnormal results are displayed) Labs Reviewed  CBC WITH DIFFERENTIAL/PLATELET - Abnormal; Notable for the following components:      Result Value   WBC 13.8 (*)    Platelets 426 (*)    Neutro Abs 8.4 (*)    Monocytes Absolute 1.2 (*)    Eosinophils Absolute 0.8 (*)    All other components within normal limits  BASIC METABOLIC PANEL - Abnormal; Notable for the following components:   Sodium 131 (*)    Chloride 95 (*)    Glucose, Bld 107 (*)    All other components within normal limits  RESP PANEL BY RT-PCR (RSV, FLU A&B, COVID)  RVPGX2  BRAIN NATRIURETIC PEPTIDE    EKG None  Radiology DG Chest 2 View Result Date: 06/03/2023 CLINICAL DATA:  Cough EXAM: CHEST - 2 VIEW COMPARISON:  01/13/2023 FINDINGS: Stable cardiomediastinal contours. Aortic atherosclerosis. No focal airspace consolidation, pleural effusion, or pneumothorax. IMPRESSION: No active cardiopulmonary  disease. Electronically Signed   By: Duanne Guess D.O.   On: 06/03/2023 19:16   CT Head Wo Contrast Result Date: 06/03/2023 CLINICAL DATA:  Headache EXAM: CT HEAD WITHOUT CONTRAST TECHNIQUE: Contiguous axial images were obtained from the base of the skull through the vertex without intravenous contrast. RADIATION DOSE REDUCTION: This exam was performed according to the departmental dose-optimization program which includes automated exposure control, adjustment of the mA and/or kV according to patient size and/or use of iterative reconstruction technique. COMPARISON:  10/29/2022 FINDINGS: Brain: No evidence of acute infarction, hemorrhage, hydrocephalus, extra-axial collection or mass lesion/mass  effect. Patchy low-density changes within the periventricular and subcortical white matter most compatible with chronic microvascular ischemic change. Mild-moderate diffuse cerebral volume loss. Vascular: Atherosclerotic calcifications involving the large vessels of the skull base. No unexpected hyperdense vessel. Skull: Normal. Negative for fracture or focal lesion. Sinuses/Orbits: Mucosal thickening of the bilateral ethmoid air cells. Air-fluid levels within the bilateral sphenoid sinuses and within a posterior left ethmoid air cell. Mastoid air cells are clear. Other: None. IMPRESSION: 1. No acute intracranial findings. 2. Chronic microvascular ischemic change and cerebral volume loss. 3. Paranasal sinus disease with air-fluid levels within the bilateral sphenoid sinuses and within a posterior left ethmoid air cell. Correlate for acute sinusitis. Electronically Signed   By: Duanne Guess D.O.   On: 06/03/2023 19:09    Procedures Procedures    Medications Ordered in ED Medications - No data to display  ED Course/ Medical Decision Making/ A&P Clinical Course as of 06/03/23 2127  Wynelle Link Jun 03, 2023  1922 Patient has allergy to Tylenol listed in chart.  However patient reports she does not have allergy to  Tylenol she just does not like to take it because she heard it could do something to your liver. Has been taking "[employer] Headache" at home, which has tylenol in it, and she does fine.  [JB]    Clinical Course User Index [JB] Prakriti Carignan, Horald Chestnut, PA-C                                 Medical Decision Making Amount and/or Complexity of Data Reviewed Labs: ordered. Radiology: ordered.  Risk OTC drugs. Prescription drug management.   Ivi LATEFA HAYHURST 78 y.o. presented today for shortness of breath.  Working DDx that I considered at this time includes, but not limited to, asthma/COPD exacerbation, URI, viral illness, anemia, ACS, PE, pneumonia, pleural effusion, lung mass.  R/o DDx: These are considered less likely due to history of present illness, physical exam, labs/imaging findings  Pmhx: CHF, sleep apnea, postpolio syndrome, migraines, type 2 diabetes, GERD, hypertension  Review of prior external notes: None   Unique Tests and My Interpretation:  CBC: Mild leukocytosis, no anemia BMP: No acute electrolyte abnormality BNP: PENDING  CXR: Chest x-ray without any acute abnormality CT head: No acute intracranial abnormality.  Patient does have findings suggesting acute sinusitis.  Patient does not have any tenderness over maxillary or frontal sinuses however she does endorse congestion which has been ongoing and feels "sinus pressure"  Respiratory Panel: NEG  Problem List / ED Course / Critical interventions / Medication management  Patient reporting to emergency room with cough.  Patient was seen at urgent care earlier today and instructed to go to the emergency room because they were concerned about her continued productive cough and congestion.  Patient reports that she is already been on doxycycline previous week for "chest infection."  Patient is hemodynamically stable upon arrival with no fever she is not requiring any oxygen.  She is not endorsing significant shortness of breath.  She  does complain of headache which she thinks is from continuous coughing.  Denies any injuries trauma or fall.  Denies any neurological deficit.  Patient requesting something for headache. I ordered medication including Tylenol  Reevaluation of the patient after these medicines showed that the patient improved Patients vitals assessed. Upon arrival patient is hemodynamically stable.  I have reviewed the patients home medicines and have made adjustments as needed  Plan:  Signed off to [facility].  Anticipate patient will be stable for discharge, reassuring workup here so far. Could consider Tessalon and albuterol inhaler to send patient home with.          Final Clinical Impression(s) / ED Diagnoses Final diagnoses:  None    Rx / DC Orders ED Discharge Orders     None         Raford Pitcher Evalee Jefferson 06/03/23 2132    Wynetta Fines, MD 06/03/23 [id number]

## 2023-06-03 NOTE — ED Provider Notes (Signed)
Patient was signed out to myself at shift change by Jasmine Pang, PA-C pending labs.  Please see their note for full HPI, ROS, PE and MDM.  Patient presents today from urgent care where they were suspicious for pneumonia and wanted a CT done.  Patient has had a productive cough for approximately 2 weeks and bilateral upper extremity edema.  Patient states her cough is worse laying down.  She has taken doxycycline during this time which did not improve her symptoms.  Physical Exam  BP (!) 168/79 (BP Location: Right Arm)   Pulse (!) 58   Temp 98.3 F (36.8 C) (Oral)   Resp 14   Wt 81.6 kg   SpO2 99%   BMI 35.13 kg/m   Physical Exam Vitals and nursing note reviewed.  Constitutional:      General: She is not in acute distress.    Appearance: Normal appearance. She is well-developed.  HENT:     Head: Normocephalic and atraumatic.     Right Ear: External ear normal.     Left Ear: External ear normal.     Nose: Congestion present.     Mouth/Throat:     Mouth: Mucous membranes are moist.  Eyes:     Extraocular Movements: Extraocular movements intact.     Conjunctiva/sclera: Conjunctivae normal.  Cardiovascular:     Rate and Rhythm: Normal rate and regular rhythm.     Pulses: Normal pulses.     Heart sounds: Normal heart sounds. No murmur heard. Pulmonary:     Effort: Pulmonary effort is normal. No respiratory distress.     Breath sounds: Rhonchi present.  Abdominal:     Palpations: Abdomen is soft.     Tenderness: There is no abdominal tenderness.  Musculoskeletal:        General: No swelling.     Cervical back: Neck supple.     Right lower leg: Edema present.     Left lower leg: Edema present.     Comments: Bilateral lower extremity edema which the patient states is chronic  Skin:    General: Skin is warm and dry.     Capillary Refill: Capillary refill takes less than 2 seconds.  Neurological:     Mental Status: She is alert.  Psychiatric:        Mood and Affect: Mood  normal.     Procedures  Procedures  ED Course / MDM   Clinical Course as of 06/03/23 2015  Sun Jun 03, 2023  1922 Patient has allergy to Tylenol listed in chart.  However patient reports she does not have allergy to Tylenol she just does not like to take it because she heard it could do something to your liver. Has been taking "[employer] Headache" at home, which has tylenol in it, and she does fine.  [JB]    Clinical Course User Index [JB] Barrett, Horald Chestnut, PA-C   Medical Decision Making Amount and/or Complexity of Data Reviewed Labs: ordered. Radiology: ordered.  Risk OTC drugs.   Labs: BNP: 81.1 Respiratory panel: Negative  Considered for admission or further workup however patient's vital signs, physical exam, labs, and imaging were all reassuring.  Patient symptoms likely due to viral upper respiratory infection.  Patient should follow-up with primary care physician if symptoms persist for further evaluation and treatment       Gretta Began 06/03/23 2202    Bethann Berkshire, MD 06/04/23 1234

## 2023-06-03 NOTE — ED Triage Notes (Signed)
BIB husband from home for non-productive cough. Seen by [facility] in [place] and was instructed to go to ED for possible PNA. C/o congested non-productive cough for ~ 2 weeks. States, "it has triggered my mi8graine HA". Denies CP, fever, NVD, sob. W/c dependent. Currently using her personal power chair. Rates HA 10/10. Husband present.

## 2023-06-03 NOTE — Discharge Instructions (Addendum)
Today you are seen for an upper respiratory infection.  You may alternate taking Tylenol and Motrin as needed for pain.  You may take plain Mucinex for congestion.  You may take Robitussin at night for cough as needed.  Please do not take Motrin for greater than 5 days as it may cause rebound headaches.  If your symptoms persist please follow-up with your primary care physician for further evaluation and treatment. Thank you for letting us treat you today. After reviewing your labs and imaging, I feel you are safe to go home. Please follow up with your PCP in the next several days and provide them with your records from this visit. Return to the Emergency Room if pain becomes severe or symptoms worsen.

## 2023-06-12 ENCOUNTER — Other Ambulatory Visit: Payer: Self-pay

## 2023-06-12 ENCOUNTER — Encounter (HOSPITAL_COMMUNITY): Payer: Self-pay

## 2023-06-12 ENCOUNTER — Emergency Department (HOSPITAL_COMMUNITY)
Admission: EM | Admit: 2023-06-12 | Discharge: 2023-06-13 | Disposition: A | Discharge status: Home / Self Care | Payer: Medicare Other | Attending: Emergency Medicine | Admitting: Emergency Medicine

## 2023-06-12 DIAGNOSIS — E119 Type 2 diabetes mellitus without complications: Secondary | ICD-10-CM | POA: Insufficient documentation

## 2023-06-12 DIAGNOSIS — R0602 Shortness of breath: Secondary | ICD-10-CM | POA: Insufficient documentation

## 2023-06-12 DIAGNOSIS — E039 Hypothyroidism, unspecified: Secondary | ICD-10-CM | POA: Insufficient documentation

## 2023-06-12 DIAGNOSIS — G14 Postpolio syndrome: Secondary | ICD-10-CM | POA: Insufficient documentation

## 2023-06-12 DIAGNOSIS — H6123 Impacted cerumen, bilateral: Secondary | ICD-10-CM | POA: Diagnosis not present

## 2023-06-12 DIAGNOSIS — R059 Cough, unspecified: Secondary | ICD-10-CM | POA: Diagnosis not present

## 2023-06-12 DIAGNOSIS — Z20822 Contact with and (suspected) exposure to covid-19: Secondary | ICD-10-CM | POA: Insufficient documentation

## 2023-06-12 DIAGNOSIS — Z79899 Other long term (current) drug therapy: Secondary | ICD-10-CM | POA: Insufficient documentation

## 2023-06-12 DIAGNOSIS — A599 Trichomoniasis, unspecified: Secondary | ICD-10-CM | POA: Insufficient documentation

## 2023-06-12 DIAGNOSIS — H9201 Otalgia, right ear: Secondary | ICD-10-CM | POA: Diagnosis present

## 2023-06-12 DIAGNOSIS — N3001 Acute cystitis with hematuria: Secondary | ICD-10-CM | POA: Diagnosis not present

## 2023-06-12 DIAGNOSIS — Z7984 Long term (current) use of oral hypoglycemic drugs: Secondary | ICD-10-CM | POA: Insufficient documentation

## 2023-06-12 NOTE — ED Triage Notes (Signed)
Pt arrived via [facility] c/o ear pain Vital signs: BP 148/70 HR 78 RA 95% cbg 161

## 2023-06-13 ENCOUNTER — Emergency Department (HOSPITAL_COMMUNITY): Payer: Medicare Other

## 2023-06-13 LAB — CBC WITH DIFFERENTIAL/PLATELET
Abs Immature Granulocytes: 0.08 10*3/uL — ABNORMAL HIGH (ref 0.00–0.07)
Basophils Absolute: 0.1 10*3/uL (ref 0.0–0.1)
Basophils Relative: 1 %
Eosinophils Absolute: 0.8 10*3/uL — ABNORMAL HIGH (ref 0.0–0.5)
Eosinophils Relative: 6 %
HCT: 39.1 % (ref 36.0–46.0)
Hemoglobin: 13.1 g/dL (ref 12.0–15.0)
Immature Granulocytes: 1 %
Lymphocytes Relative: 27 %
Lymphs Abs: 3.7 10*3/uL (ref 0.7–4.0)
MCH: 30.7 pg (ref 26.0–34.0)
MCHC: 33.5 g/dL (ref 30.0–36.0)
MCV: 91.6 fL (ref 80.0–100.0)
Monocytes Absolute: 1.1 10*3/uL — ABNORMAL HIGH (ref 0.1–1.0)
Monocytes Relative: 8 %
Neutro Abs: 7.7 10*3/uL (ref 1.7–7.7)
Neutrophils Relative %: 57 %
Platelets: 507 10*3/uL — ABNORMAL HIGH (ref 150–400)
RBC: 4.27 MIL/uL (ref 3.87–5.11)
RDW: 14.6 % (ref 11.5–15.5)
WBC: 13.4 10*3/uL — ABNORMAL HIGH (ref 4.0–10.5)
nRBC: 0 % (ref 0.0–0.2)

## 2023-06-13 LAB — COMPREHENSIVE METABOLIC PANEL
ALT: 20 U/L (ref 0–44)
AST: 24 U/L (ref 15–41)
Albumin: 3.1 g/dL — ABNORMAL LOW (ref 3.5–5.0)
Alkaline Phosphatase: 69 U/L (ref 38–126)
Anion gap: 12 (ref 5–15)
BUN: 8 mg/dL (ref 8–23)
CO2: 23 mmol/L (ref 22–32)
Calcium: 8.9 mg/dL (ref 8.9–10.3)
Chloride: 99 mmol/L (ref 98–111)
Creatinine, Ser: 0.5 mg/dL (ref 0.44–1.00)
GFR, Estimated: >60 mL/min (ref >60)
Glucose, Bld: 166 mg/dL — ABNORMAL HIGH (ref 70–99)
Potassium: 3.7 mmol/L (ref 3.5–5.1)
Sodium: 134 mmol/L — ABNORMAL LOW (ref 135–145)
Total Bilirubin: 0.9 mg/dL (ref 0.0–1.2)
Total Protein: 7 g/dL (ref 6.5–8.1)

## 2023-06-13 LAB — URINALYSIS, MICROSCOPIC (REFLEX): WBC, UA: >50 WBC/hpf (ref 0–5)

## 2023-06-13 LAB — BRAIN NATRIURETIC PEPTIDE: B Natriuretic Peptide: 54.6 pg/mL (ref 0.0–100.0)

## 2023-06-13 LAB — URINALYSIS, ROUTINE W REFLEX MICROSCOPIC
Bilirubin Urine: NEGATIVE
Glucose, UA: NEGATIVE mg/dL
Ketones, ur: NEGATIVE mg/dL
Nitrite: POSITIVE — AB
Protein, ur: 30 mg/dL — AB
Specific Gravity, Urine: 1.01 (ref 1.005–1.030)
pH: 6 (ref 5.0–8.0)

## 2023-06-13 LAB — TROPONIN I (HIGH SENSITIVITY)
Troponin I (High Sensitivity): 4 ng/L (ref <18)
Troponin I (High Sensitivity): 3 ng/L (ref <18)

## 2023-06-13 LAB — RESP PANEL BY RT-PCR (RSV, FLU A&B, COVID)  RVPGX2
Influenza A by PCR: NEGATIVE
Influenza B by PCR: NEGATIVE
Resp Syncytial Virus by PCR: NEGATIVE
SARS Coronavirus 2 by RT PCR: NEGATIVE

## 2023-06-13 MED ORDER — CEFPODOXIME PROXETIL 200 MG PO TABS: 200.0000 mg | ORAL_TABLET | Freq: Two times a day (BID) | ORAL | 0 refills | Status: AC

## 2023-06-13 MED ORDER — FLUTICASONE PROPIONATE 50 MCG/ACT NA SUSP: 1.0000 | Freq: Every day | NASAL | 2 refills | Status: DC

## 2023-06-13 MED ORDER — FLUCONAZOLE 150 MG PO TABS: 150.0000 mg | ORAL_TABLET | Freq: Every day | ORAL | 0 refills | Status: DC

## 2023-06-13 MED ORDER — METRONIDAZOLE 500 MG PO TABS: 500.0000 mg | ORAL_TABLET | Freq: Two times a day (BID) | ORAL | 0 refills | Status: DC

## 2023-06-13 NOTE — ED Notes (Signed)
Ear wax removal completed two large removed from both ear

## 2023-06-13 NOTE — Discharge Instructions (Addendum)
You were seen in the emergency department today for evaluation your ear pain.  You had earwax buildup in both of your ears.  I am glad that you are feeling better.  I recommended some Flonase to help with your nasal congestion.  Please take the antibiotic that was given to you by your primary care provider.  Your urine does show signs of UTI.  I am starting you on a antibiotic called cefpodoxime for this.  Urine also showed signs of trichomoniasis which is a sexually transmitted infection.  I included more information on this in the discharge paperwork.  You will be started on antibiotic called Flagyl to take for this.  I would make sure to follow-up with a urologist for these issues.  Please make sure you call to schedule for an appointment.  If you have any concerns, new or worsening symptoms, please return to the nearest emergency department for reevaluation.  Contact a health care provider if: Your symptoms don't get better after 1-2 days of taking antibiotics. Your symptoms go away and then come back. You have a fever or chills. You vomit or feel like you may vomit. Get help right away if: You have very bad pain in your back or lower belly. You faint.

## 2023-06-13 NOTE — ED Provider Notes (Signed)
[place] EMERGENCY DEPARTMENT AT [facility] Provider Note   CSN: [id number] Arrival date & time: 06/12/23  2325     History Chief Complaint  Patient presents with   Otalgia    [name] is a 78 y.o. female with history of hypothyroidism, postpolio syndrome, A-fib, type 2 diabetes presents to the emergency department today for evaluation of right ear pain.  Right ear pain been intermittent since Friday.  Denies any trauma.  Denies any otorrhea or any hearing changes.  Daughter is at bedside and also reports that she has been dealing with a cough and cold symptoms for the past few weeks.  Was seen at her primary care office and was placed on a Z-Pak.  She reports she is does not know if the primary care doctor looked in her ear or not.  The daughter does not not have any more information on the visit from today.  Currently denies any fever.  She denies any chest pain or shortness of breath.  Multiple antibiotic allergies.   Otalgia Associated symptoms: congestion, cough and rhinorrhea   Associated symptoms: no abdominal pain and no fever        Home Medications Prior to Admission medications   Medication Sig Start Date End Date Taking? Authorizing Provider  cefpodoxime (VANTIN) 200 MG tablet Take 1 tablet (200 mg total) by mouth 2 (two) times daily for 7 days. 06/13/23 06/20/23 Yes Achille Rich, PA-C  fluticasone (FLONASE) 50 MCG/ACT nasal spray Place 1 spray into both nostrils daily. 06/13/23  Yes Achille Rich, PA-C  metroNIDAZOLE (FLAGYL) 500 MG tablet Take 1 tablet (500 mg total) by mouth 2 (two) times daily. 06/13/23  Yes Achille Rich, PA-C  albuterol (VENTOLIN HFA) 108 (90 Base) MCG/ACT inhaler Inhale 1-2 puffs into the lungs every 6 (six) hours as needed for wheezing or shortness of breath. 06/03/23   Dolphus Jenny, PA-C  amitriptyline (ELAVIL) 25 MG tablet Take 25 mg by mouth at bedtime.    [provider]  atorvastatin (LIPITOR) 40 MG tablet Take 40 mg  by mouth daily.    [provider]  carvedilol (COREG) 25 MG tablet Take 25 mg by mouth 2 (two) times daily with a meal.    [provider]  cholecalciferol (VITAMIN D3) 25 MCG (1000 UNIT) tablet Take 1,000 Units by mouth daily.    [provider]  clonazePAM (KLONOPIN) 0.5 MG tablet Take 0.5 mg by mouth at bedtime.    [provider]  gabapentin (NEURONTIN) 300 MG capsule Take 300 mg by mouth 2 (two) times daily.    [provider]  hydrOXYzine (VISTARIL) 25 MG capsule Take 25 mg by mouth at bedtime. 12/31/22   [provider]  levothyroxine (SYNTHROID, LEVOTHROID) 100 MCG tablet Take 100 mcg by mouth daily before breakfast.    [provider]  loratadine (CLARITIN) 10 MG tablet Take 10 mg by mouth daily as needed for allergies. 12/17/19   [provider]  metFORMIN (GLUCOPHAGE) 500 MG tablet Take 500 mg by mouth 2 (two) times daily.    [provider]  mirabegron ER (MYRBETRIQ) 50 MG TB24 tablet Take 50 mg by mouth daily.    [provider]  Multiple Vitamins-Minerals (MULTIVITAMIN WITH MINERALS) tablet Take 1 tablet by mouth daily.    [provider]  nitroGLYCERIN (NITROSTAT) 0.4 MG SL tablet Place 1 tablet (0.4 mg total) under the tongue every 5 (five) minutes as needed for chest pain. 12/07/22  Alver Sorrow, NP  pantoprazole (PROTONIX) 40 MG tablet Take 1 tablet by mouth daily. Take 1 tab daily 12/21/14   [provider]  telmisartan (MICARDIS) 80 MG tablet Take 40 mg by mouth daily. Pt takes half of a tab daily 12/14/14   [provider]  tiZANidine (ZANAFLEX) 4 MG tablet Take 4 mg by mouth at bedtime. PAIN    [provider]  torsemide (DEMADEX) 5 MG tablet Take 5 mg by mouth daily.    [provider]  zolpidem (AMBIEN) 5 MG tablet Take 5 mg by mouth at bedtime as needed for sleep.    [provider]      Allergies    Tamsulosin, Ciprofloxacin,  Carisoprodol, Oxycodone-acetaminophen, Penicillins, Sulfa antibiotics, Acetaminophen, Aspirin, Macrodantin [nitrofurantoin macrocrystal], Nsaids, Percocet [oxycodone-acetaminophen], Propoxyphene, and Propoxyphene n-acetaminophen    Review of Systems   Review of Systems  Constitutional:  Negative for chills and fever.  HENT:  Positive for congestion, ear pain and rhinorrhea.   Respiratory:  Positive for cough. Negative for shortness of breath.   Cardiovascular:  Negative for chest pain.  Gastrointestinal:  Negative for abdominal pain.  Genitourinary:  Negative for dysuria, frequency, hematuria and urgency.    Physical Exam Updated Vital Signs BP (!) 114/59 (BP Location: Right Arm)   Pulse 60   Temp 98.4 F (36.9 C)   Resp 16   Ht 5' (1.524 m)   SpO2 98%   BMI 35.13 kg/m  Physical Exam Constitutional:      General: She is not in acute distress.    Appearance: She is not toxic-appearing.  HENT:     Ears:     Comments: Bilateral cerumen impaction. She does have some patches of erythema on the pinna and auricle. Question some seb derm. No mastoid tenderness to palpation.   After cerumen removal, TMs are unremarkable. External ear canals unremarkable.     Mouth/Throat:     Comments: Dentures in place Eyes:     General: No scleral icterus. Cardiovascular:     Rate and Rhythm: Normal rate.  Pulmonary:     Effort: Pulmonary effort is normal. No respiratory distress.  Skin:    General: Skin is warm and dry.  Neurological:     Mental Status: She is alert.     ED Results / Procedures / Treatments   Labs (all labs ordered are listed, but only abnormal results are displayed) Labs Reviewed  CBC WITH DIFFERENTIAL/PLATELET - Abnormal; Notable for the following components:      Result Value   WBC 13.4 (*)    Platelets 507 (*)    Monocytes Absolute 1.1 (*)    Eosinophils Absolute 0.8 (*)    Abs Immature Granulocytes 0.08 (*)    All other components within normal limits   COMPREHENSIVE METABOLIC PANEL - Abnormal; Notable for the following components:   Sodium 134 (*)    Glucose, Bld 166 (*)    Albumin 3.1 (*)    All other components within normal limits  URINALYSIS, ROUTINE W REFLEX MICROSCOPIC - Abnormal; Notable for the following components:   APPearance CLOUDY (*)    Hgb urine dipstick SMALL (*)    Protein, ur 30 (*)    Nitrite POSITIVE (*)    Leukocytes,Ua MODERATE (*)    All other components within normal limits  URINALYSIS, MICROSCOPIC (REFLEX) - Abnormal; Notable for the following components:   Bacteria, UA RARE (*)    Trichomonas, UA PRESENT (*)    All other  components within normal limits  RESP PANEL BY RT-PCR (RSV, FLU A&B, COVID)  [id number]  URINE CULTURE  BRAIN NATRIURETIC PEPTIDE  TROPONIN I (HIGH SENSITIVITY)  TROPONIN I (HIGH SENSITIVITY)    EKG EKG Interpretation Date/Time:  Wednesday June 13 2023 03:11:22 EST Ventricular Rate:  62 PR Interval:  139 QRS Duration:  106 QT Interval:  439 QTC Calculation: 446 R Axis:   3  Text Interpretation: Sinus rhythm Anteroseptal infarct, age indeterminate No significant change was found Confirmed by Glynn Octave [phone]) on 06/13/2023 6:16:43 AM  Radiology DG Chest 2 View Result Date: 06/13/2023 CLINICAL DATA:  Cough and ear pain EXAM: CHEST - 2 VIEW COMPARISON:  07/04/2023 FINDINGS: The heart size and mediastinal contours are within normal limits. Both lungs are clear. The visualized skeletal structures are unremarkable. IMPRESSION: No active cardiopulmonary disease. Electronically Signed   By: Alcide Clever M.D.   On: 06/13/2023 02:28    Procedures Procedures   Medications Ordered in ED Medications - No data to display  ED Course/ Medical Decision Making/ A&P    Medical Decision Making Amount and/or Complexity of Data Reviewed Labs: ordered. Radiology: ordered. ECG/medicine tests: ordered.  Risk Prescription drug management.   77 y.o. female presents to the ER for  evaluation of cough and right ear pain. Differential diagnosis includes but is not limited to mastoiditis, otitis media, otitis externa, foreign body, sinusitis, malignant otitis media, nasal congestion, sinusitis. Vital signs mildly elevated BP at 138/101, otherwise unremarkable. Physical exam as noted above.   Bilateral ears were irrigated by nursing staff.  Patient reports no ear pain.  She reports her symptoms have been relieved.  Her TMs are intact.  External ear canals are well-appearing.  I do not see any signs of otitis externa or media.  Question some seborrheic dermatitis may be surrounding the ear.  Discussed with daughter and patient to keep an eye on this to make sure a rash does not develop for questionable shingles.  She does not have any visual changes.  Denies any hearing changing.  Could have been having some pain because of the cerumen impactions.  I independently reviewed and interpreted the patient's labs.  CBC shows mildly elevated white blood cell, however appears to be chronic for her.  No anemia.  CMP shows mildly decreased sodium 134, glucose at once a 66.  Likely some pseudohyponatremia.  Albumin at 3.1.  Otherwise no electrolyte or LFT abnormality.  RSV, flu, COVID-negative.  BMP within normal limits.  Troponins within normal limits.    CXR : No active cardiopulmonary disease.   EKG reviewed and interpreted by my attending and read as Sinus rhythm Anteroseptal infarct, age indeterminate No significant change was found.  As patient was going to be discharged, daughter would like me to test the patient's urine.  Patient is currently asymptomatic.  Daughter reports that her sister mentioned that she did have some foul-smelling urine the other day.  They were seen at their primary care office today however she does not know that was mentioned.    Urinalysis shows cloudy urine with small mono hemoglobin.  30 protein, positive for nitrates, moderate leukocytes present.  21-50 red  blood cells and greater than 50 white blood cells seen with rare bacteria.  There is trichomonas and yeast present.  I asked the patient's daughter to step out of the room.  I asked the patient if she was sexually active.  She reports that she has not been sexually active in 20 to 16  years.  She denies any other sexual contact.  She reports that she feels safe at home and has not been experiencing any sexual assault or molestation.  Question of this is a false positive.  Would be abnormal.  She also has signs of a UTI.  It appears that she is growing out Klebsiella previously this was difficult to cephalosporins.  Patient has had cephalosporins for without any allergy.  Will start on cefpodoxime for the UTI, Flagyl for the trichomoniasis, Diflucan for the yeast, and Flonase for the nasal congestion.  She was given azithromycin for the cough by her PCP.  Discussed that she will need to follow-up with a PCP for further management.  Recommended daughter call them today.  I also recommended the patient follow-up with her urologist based on her UTIs as well as the questionable trichomoniasis seen as well.  Could be contamination however we will treat.  Ear pain is likely from the bilateral cerumen impaction.  Patient reports that she feels better and does not have any ear pain.  Will send home with some Flonase for symptoms.  Does not appear to be any mastoiditis, otitis media, otitis externa.  TMs are intact.  We discussed the results of the labs/imaging. The plan is take antibiotics, flonase, follow up with urologist and PCP. We discussed strict return precautions and red flag symptoms. The patient verbalized their understanding and agrees to the plan. The patient is stable and being discharged home in good condition.  Portions of this report may have been transcribed using voice recognition software. Every effort was made to ensure accuracy; however, inadvertent computerized transcription errors may be  present.   I discussed this case with my attending physician who cosigned this note including patient's presenting symptoms, physical exam, and planned diagnostics and interventions. Attending physician stated agreement with plan or made changes to plan which were implemented.   Attending physician assessed patient at bedside.  Final Clinical Impression(s) / ED Diagnoses Final diagnoses:  Bilateral impacted cerumen  Acute cystitis with hematuria  Trichomoniasis    Rx / DC Orders ED Discharge Orders          Ordered    metroNIDAZOLE (FLAGYL) 500 MG tablet  2 times daily        06/13/23 0704    cefpodoxime (VANTIN) 200 MG tablet  2 times daily        06/13/23 0704    fluticasone (FLONASE) 50 MCG/ACT nasal spray  Daily        06/13/23 0706              Achille Rich, PA-C 06/13/23 0745    Glynn Octave, MD 06/14/23 0530

## 2023-06-14 ENCOUNTER — Telehealth: Payer: Self-pay | Admitting: Family Medicine

## 2023-06-14 LAB — URINE CULTURE

## 2023-06-14 NOTE — Telephone Encounter (Signed)
Pt's daughter,Patty Maisie Fus (no DPR on file), would like to have mother tested for dementia. Because she is forgetful repeats things, talk about things from the past. Has been going on for a year. Was told by her PCP she needed to be referred out.  Would like a call from the nurse.

## 2023-06-14 NOTE — Telephone Encounter (Signed)
Called spouse, Jillyn Hidden (on [place]) at [phone]. Daughter not on DPR, I explained this to husband.   They are wanting her memory re-evaluated. Last visit with Amy Lomax,NP 06/14/22 but previously saw Dr. Delena Bali prior to this. Needing to get re-established with new MD. Scheduled visit with Dr. Vickey Huger 06/19/23 at 11am, check in 10:30am.  She did not go to [facility] to get neurocognitive evaluation. States they were unable and cost was too high for testing.

## 2023-06-19 ENCOUNTER — Ambulatory Visit: Payer: Medicare Other | Admitting: Neurology

## 2023-06-19 ENCOUNTER — Encounter: Payer: Self-pay | Admitting: Neurology

## 2023-06-19 VITALS — BP 146/70 | HR 59 | Ht 60.0 in

## 2023-06-19 DIAGNOSIS — F02B0 Dementia in other diseases classified elsewhere, moderate, without behavioral disturbance, psychotic disturbance, mood disturbance, and anxiety: Secondary | ICD-10-CM

## 2023-06-19 DIAGNOSIS — R413 Other amnesia: Secondary | ICD-10-CM

## 2023-06-19 MED ORDER — MEMANTINE HCL 5 MG PO TABS: 5.0000 mg | ORAL_TABLET | Freq: Two times a day (BID) | ORAL | 11 refills | Status: DC

## 2023-06-19 NOTE — Patient Instructions (Signed)
 Memantine  Tablets What is this medication? MEMANTINE  (MEM an teen) treats memory loss and confusion (dementia) in people who have Alzheimer disease. It works by improving attention, memory, and the ability to engage in daily activities. It is not a cure for dementia or Alzheimer disease. This medicine may be used for other purposes; ask your health care provider or pharmacist if you have questions. COMMON BRAND NAME(S): Namenda  What should I tell my care team before I take this medication? They need to know if you have any of these conditions: Kidney disease Liver disease Seizures Trouble passing urine An unusual or allergic reaction to memantine , other medications, foods, dyes, or preservatives Pregnant or trying to get pregnant Breast-feeding How should I use this medication? Take this medication by mouth with water . Follow the directions on the prescription label. You may take this medication with or without food. Take your doses at regular intervals. Do not take your medication more often than directed. Continue to take your medication even if you feel better. Do not stop taking except on the advice of your care team. Talk to your care team about the use of this medication in children. Special care may be needed Overdosage: If you think you have taken too much of this medicine contact a poison control center or emergency room at once. NOTE: This medicine is only for you. Do not share this medicine with others. What if I miss a dose? If you miss a dose, take it as soon as you can. If it is almost time for your next dose, take only that dose. Do not take double or extra doses. If you do not take your medication for several days, contact your care team. Your dose may need to be changed. What may interact with this medication? Acetazolamide Amantadine Cimetidine Dextromethorphan  Dofetilide Hydrochlorothiazide  Ketamine Metformin  Methazolamide Quinidine Ranitidine Sodium  bicarbonate Triamterene This list may not describe all possible interactions. Give your health care provider a list of all the medicines, herbs, non-prescription drugs, or dietary supplements you use. Also tell them if you smoke, drink alcohol, or use illegal drugs. Some items may interact with your medicine. What should I watch for while using this medication? Visit your care team for regular checks on your progress. Check with your care team if there is no improvement in your symptoms or if they get worse. This medication may affect your coordination, reaction time, or judgment. Do not drive or operate machinery until you know how this medication affects you. Sit up or stand slowly to reduce the risk of dizzy or fainting spells. Drinking alcohol with this medication can increase the risk of these side effects. What side effects may I notice from receiving this medication? Side effects that you should report to your care team as soon as possible: Allergic reactions--skin rash, itching, hives, swelling of the face, lips, tongue, or throat Side effects that usually do not require medical attention (report to your care team if they continue or are bothersome): Confusion Constipation Diarrhea Dizziness Headache This list may not describe all possible side effects. Call your doctor for medical advice about side effects. You may report side effects to FDA at 1-800-FDA-1088. Where should I keep my medication? Keep out of the reach of children. Store at room temperature between 15 degrees and 30 degrees C (59 degrees and 86 degrees F). Throw away any unused medication after the expiration date. NOTE: This sheet is a summary. It may not cover all possible information. If you have questions about  this medicine, talk to your doctor, pharmacist, or health care provider.  2024 Elsevier/Gold Standard (2021-05-24 00:00:00)Dementia Dementia is a condition that affects the way the brain works. It often affects  thinking and memory.  There are many types of dementia, including: Alzheimer's disease. This is the most common type. Vascular dementia. This type may happen due to a stroke. Lewy body dementia. This type may happen to people who have Parkinson's disease. Frontotemporal dementia. This type is caused by damage to nerve cells in certain parts of the brain. Some people may have more than one type. What are the causes? Dementia is caused by damage to cells in the brain. Some causes that can't be reversed include: Having a condition that affects the blood vessels of the brain. This may be diabetes or heart disease. Changes to genes. Some causes that can be reversed or slowed down include: Injury to the brain due to: A growth called a tumor. A blood clot. Too much fluid in the brain. Taking certain medicines. An infection. Problems with your thyroid . Not having enough vitamin B12 in the body. Having a disease that causes your body's defense system, called the immune system, to attack healthy parts of your body. What are the signs or symptoms? Symptoms of dementia start slowly and get worse with time. They may include: Problems remembering events or people. Getting lost easily. Forgetting appointments or to pay bills. Having trouble taking a bath or putting clothes on. Having trouble planning and making meals. Having trouble speaking. Changes in behavior or mood. How is this diagnosed? Dementia may be diagnosed based on: Your symptoms and medical history. A physical exam. Tests. These may include: Tests to check your thinking and memory to see how your brain is working. Lab tests. You may have tests on your blood or pee (urine). Imaging tests, such as a CT scan, a PET scan, or an MRI. Genetic testing. This may be done if other family members have had dementia. Your health care provider will talk with you and your family, friends, or caregivers about your history and symptoms. How is  this treated? Treatment depends on the cause of the dementia and should start as soon as possible. It might include: Taking medicines for symptoms. Taking medicines to help control or slow down the dementia. Treating the cause of your dementia. Your provider can help you find support groups and other members of the health care team who can help with your care. Follow these instructions at home: Medicines Take medicines only as told by your provider. Use a pill organizer or pill reminder to help you keep track of your medicines. Avoid taking medicines for pain or for sleep. These can affect your thinking. Lifestyle Make healthy choices. Be active as told by your provider. Do not smoke, vape, or use products with nicotine or tobacco in them. If you need help quitting, talk with your provider. Do not drink alcohol. When you feel a lot of stress, do something that helps you relax. Your provider can give you tips. Spend time with other people. Make sure you get good sleep at night. These tips can help: Try not to take naps during the day. Keep your bedroom dark and cool. Do not exercise in the few hours before you go to bed. Do not have foods or drinks with caffeine at night. Eating and drinking Drink enough fluid to keep your pee pale yellow. Eat a healthy diet. General instructions  Talk with your provider to decide on: What  things you need help with. What your safety needs are. Ask your provider if it's safe for you to drive. If told, wear a bracelet that tracks where you are or shows that you're a person with memory loss. Work with your family to make big legal or health decisions. This may include things like advance directives, medical power of attorney, or a living will. Where to find more information Alzheimer's Association: westerntunes.it [employer] on Aging: baseringtones.pl World Health Organization: visitdestination.com.br Contact a health care provider if: You have any new symptoms. Your  symptoms get worse. You have problems with swallowing. Get help right away if: You feel very sad or feel like you may hurt yourself or others. You have thoughts about taking your own life. Your family members are worried about your safety. These symptoms may be an emergency. Take one of these steps right away: Go to your nearest emergency room. Call 911. Call the National Suicide Prevention Lifeline at (910)077-0570 or 988. Text the Crisis Text Line at 308 536 9477. This information is not intended to replace advice given to you by your health care provider. Make sure you discuss any questions you have with your health care provider. Document Revised: 03/01/2023 Document Reviewed: 07/17/2022 Elsevier Patient Education  2024 [employer].Problems With Thinking and Memory (Mild Neurocognitive Disorder): What to Know Mild neurocognitive disorder, formerly known as mild cognitive impairment, is a disorder where your memory doesn't work as well as it should. It may also affect other mental abilities like thinking, communicating, behavior, and being able to finish tasks. These problems can be noticed and measured. But they usually don't stop you from doing daily activities or living on your own. Mild neurocognitive disorder usually happens after 80 years of age. But it can also happen at younger ages. It's not as serious as major neurocognitive disorder, also known as dementia, but it may be the first sign of it. In general, the symptoms of this condition get worse over time. In rare cases, symptoms can get better. What are the causes? This condition may be caused by: Brain disorders like Alzheimer's disease, Parkinson's disease, and other conditions that slowly damage nerve cells. Diseases that affect the blood vessels in the brain and cause small strokes. Certain infections, like HIV. Traumatic brain injury. Other medical conditions, such as brain tumors, underactive thyroid  (hypothyroidism), and not  having enough vitamin B12. Using certain drugs or medicines. What increases the risk? Being older than 80 years of age. Being female. Having a lower level of education. Diabetes, high blood pressure, high cholesterol, and other conditions that raise the risk for blood vessel diseases. Untreated or undertreated sleep apnea. Having a certain type of gene that can be inherited, or passed down from parent to child. Long-term health problems like heart disease, lung disease, liver disease, kidney disease, or depression. What are the signs or symptoms? Trouble remembering things. You may: Forget names, phone numbers, or details of recent events. Forget about social events and appointments. Often forget where you put your car keys or other items. Trouble thinking and solving problems. You may have trouble with complex tasks like: Paying bills. Driving in places you don't know well. Trouble communicating. You may have trouble: Finding the right word or naming an object. Forming a sentence that makes sense. Understanding what you read or hear. Changes in your behavior or personality. When this happens, you may: Lose interest in the things you used to enjoy. Avoid being around people. Get angry more easily than usual. Act  before thinking. How is this diagnosed? This condition is diagnosed based on: Your symptoms. Your health care provider may ask you and the people you spend time with, like family and friends, about your symptoms. Memory tests and other tests to check how your brain is working. Your provider may refer you to a provider called a neurologist or a mental health specialist. To try to find out the cause of your condition, your provider may: Get a detailed medical history. Ask about use of alcohol, drugs, and medicines. Do a physical exam. Order blood tests and brain imaging tests. How is this treated? Mild neurocognitive disorder that's caused by medicine use, drug use,  infection, or another medical condition may get better when the cause is treated, or when medicines or drugs are stopped. If this disorder has another cause, it usually doesn't improve and may get worse. In these cases, the goal of treatment is to help you manage the symptoms. This may include: Medicines to help with memory and behavior symptoms. Talk therapy. This provides education, emotional support, memory aids, and other ways of making up for problems with mental tasks. Lifestyle changes. These may include: Getting regular exercise. Eating a healthy diet that includes omega-3 fatty acids . Doing things to challenge your thinking and memory skills. Spending more time being with and talking to other people. Using routines like having regular times for meals and going to bed. Follow these instructions at home: Eating and drinking  Drink more fluids as told. Eat a healthy diet that includes omega-3 fatty acids . These can be found in: Fish. Nuts. Leafy vegetables. Vegetable oils. If you drink alcohol: Limit how much you have to: 0-1 drink a day if you're female. 0-2 drinks a day if you're female. Know how much alcohol is in your drink. In the [place] one drink is one 12 oz bottle of beer (355 mL), one 5 oz glass of wine (148 mL), or one 1 oz glass of hard liquor (44 mL). Lifestyle  Get regular exercise as told by your provider. Do not smoke, vape, or use nicotine or tobacco. Use healthy ways to manage stress. If you need help managing stress, ask your provider. Keep spending time with other people. Keep your mind active by doing activities you enjoy, like reading or playing games. Make sure you get good sleep at night. These tips can help: Try not to take naps during the day. Keep your bedroom dark and cool. Do not exercise in the few hours before you go to bed. Do not have foods or drinks with caffeine at night. General instructions Take medicines only as told. Your provider may  tell you to avoid taking medicines that can affect thinking. These include some medicines for pain or sleeping. Work with your provider to find out: What things you need help with. What your safety needs are. Where to find more information [employer] on Aging: baseringtones.pl Contact a health care provider if: You have any new symptoms. Get help right away if: You have new confusion or your confusion gets worse. You act in ways that put you or your family in danger. This information is not intended to replace advice given to you by your health care provider. Make sure you discuss any questions you have with your health care provider. Document Revised: 10/24/2022 Document Reviewed: 10/24/2022 Elsevier Patient Education  2024 [employer].
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
## 2023-07-09 ENCOUNTER — Encounter (HOSPITAL_COMMUNITY): Payer: Self-pay | Admitting: Emergency Medicine

## 2023-07-09 ENCOUNTER — Other Ambulatory Visit: Payer: Self-pay

## 2023-07-09 ENCOUNTER — Emergency Department (HOSPITAL_COMMUNITY)
Admission: EM | Admit: 2023-07-09 | Discharge: 2023-07-10 | Disposition: A | Discharge status: Home / Self Care | Payer: Medicare Other | Attending: Emergency Medicine | Admitting: Emergency Medicine

## 2023-07-09 DIAGNOSIS — Z79899 Other long term (current) drug therapy: Secondary | ICD-10-CM | POA: Insufficient documentation

## 2023-07-09 DIAGNOSIS — Z7951 Long term (current) use of inhaled steroids: Secondary | ICD-10-CM | POA: Insufficient documentation

## 2023-07-09 DIAGNOSIS — E039 Hypothyroidism, unspecified: Secondary | ICD-10-CM | POA: Insufficient documentation

## 2023-07-09 DIAGNOSIS — J45909 Unspecified asthma, uncomplicated: Secondary | ICD-10-CM | POA: Insufficient documentation

## 2023-07-09 DIAGNOSIS — Z7984 Long term (current) use of oral hypoglycemic drugs: Secondary | ICD-10-CM | POA: Diagnosis not present

## 2023-07-09 DIAGNOSIS — N390 Urinary tract infection, site not specified: Secondary | ICD-10-CM | POA: Diagnosis not present

## 2023-07-09 DIAGNOSIS — I110 Hypertensive heart disease with heart failure: Secondary | ICD-10-CM | POA: Insufficient documentation

## 2023-07-09 DIAGNOSIS — I509 Heart failure, unspecified: Secondary | ICD-10-CM | POA: Insufficient documentation

## 2023-07-09 DIAGNOSIS — E119 Type 2 diabetes mellitus without complications: Secondary | ICD-10-CM | POA: Insufficient documentation

## 2023-07-09 DIAGNOSIS — R030 Elevated blood-pressure reading, without diagnosis of hypertension: Secondary | ICD-10-CM

## 2023-07-09 NOTE — ED Triage Notes (Signed)
 Patient BIB [facility] from home c/o hypertension and bilateral knee and leg pain.  Patient had cortisone injections last week in knees.  194/94 60 HR 97% 2 L PRN 158 CBG

## 2023-07-10 LAB — CBC WITH DIFFERENTIAL/PLATELET
Abs Immature Granulocytes: 0.07 10*3/uL (ref 0.00–0.07)
Basophils Absolute: 0 10*3/uL (ref 0.0–0.1)
Basophils Relative: 0 %
Eosinophils Absolute: 0.2 10*3/uL (ref 0.0–0.5)
Eosinophils Relative: 2 %
HCT: 40.1 % (ref 36.0–46.0)
Hemoglobin: 13.3 g/dL (ref 12.0–15.0)
Immature Granulocytes: 1 %
Lymphocytes Relative: 31 %
Lymphs Abs: 4.2 10*3/uL — ABNORMAL HIGH (ref 0.7–4.0)
MCH: 31.5 pg (ref 26.0–34.0)
MCHC: 33.2 g/dL (ref 30.0–36.0)
MCV: 95 fL (ref 80.0–100.0)
Monocytes Absolute: 1.2 10*3/uL — ABNORMAL HIGH (ref 0.1–1.0)
Monocytes Relative: 9 %
Neutro Abs: 8 10*3/uL — ABNORMAL HIGH (ref 1.7–7.7)
Neutrophils Relative %: 57 %
Platelets: 444 10*3/uL — ABNORMAL HIGH (ref 150–400)
RBC: 4.22 MIL/uL (ref 3.87–5.11)
RDW: 15.1 % (ref 11.5–15.5)
WBC: 13.7 10*3/uL — ABNORMAL HIGH (ref 4.0–10.5)
nRBC: 0 % (ref 0.0–0.2)

## 2023-07-10 LAB — BASIC METABOLIC PANEL
Anion gap: 9 (ref 5–15)
BUN: 15 mg/dL (ref 8–23)
CO2: 25 mmol/L (ref 22–32)
Calcium: 9 mg/dL (ref 8.9–10.3)
Chloride: 102 mmol/L (ref 98–111)
Creatinine, Ser: 0.44 mg/dL (ref 0.44–1.00)
GFR, Estimated: >60 mL/min (ref >60)
Glucose, Bld: 157 mg/dL — ABNORMAL HIGH (ref 70–99)
Potassium: 4.4 mmol/L (ref 3.5–5.1)
Sodium: 136 mmol/L (ref 135–145)

## 2023-07-10 LAB — URINALYSIS, W/ REFLEX TO CULTURE (INFECTION SUSPECTED)
Bilirubin Urine: NEGATIVE
Glucose, UA: NEGATIVE mg/dL
Ketones, ur: NEGATIVE mg/dL
Nitrite: NEGATIVE
Protein, ur: NEGATIVE mg/dL
Specific Gravity, Urine: 1.01 (ref 1.005–1.030)
WBC, UA: >50 WBC/hpf (ref 0–5)
pH: 5 (ref 5.0–8.0)

## 2023-07-10 MED ORDER — CEFADROXIL 500 MG PO CAPS: 500.0000 mg | ORAL_CAPSULE | Freq: Two times a day (BID) | ORAL | 0 refills | Status: AC

## 2023-07-10 MED ORDER — KETOROLAC TROMETHAMINE 15 MG/ML IJ SOLN
15.0000 mg | Freq: Once | INTRAMUSCULAR | Status: AC
  Administered 2023-07-10: 15 mg via INTRAVENOUS
  Filled 2023-07-10: qty 1

## 2023-07-10 NOTE — ED Notes (Signed)
 Reviewed D/C information with the patient & family, pt verbalized understanding. No additional concerns at this time.

## 2023-07-10 NOTE — ED Provider Notes (Signed)
 [place] [facility] EMERGENCY DEPARTMENT AT [facility] Provider Note  CSN: [id number] Arrival date & time: 07/09/23 2325  Chief Complaint(s) Hypertension and Knee Pain  HPI [name] is a 78 y.o. 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  Patient also endorsing bilateral knee pain.  Pain has been ongoing for some time.  She states that she received an injection of cortisone in her right knee and in her lower back.  This was performed by orthopedic surgery last week.  Since then they have noted that her blood pressures have been fluctuating with systolics in the 180s to 190s this week. No chest pain or SOB. Knee pain is stable and at baseline.  The history is provided by the patient.    Past Medical History Past Medical History:  Diagnosis Date   Angina pectoris (HCC) 08/02/2016   Anxiety    Arthritis    "hands, knees, back" (07/18/2013)   Asthma    CHF (congestive heart failure) (HCC)    Chronic back pain    Chronic lower back pain    Family history of anesthesia complication    Mother and Sister- N/V   GERD (gastroesophageal reflux disease)    H/O hiatal hernia    Headache(784.0)    "weekly" (07/18/2013)   Hypertension    Hypothyroidism    Insomnia    Kidney stones    Memory changes    Mental disorder    Migraine    "years ago" (07/18/2013)   Osteopenia    Post-polio syndrome    Pure hypercholesterolemia 02/07/2021   Sleep apnea    Takotsubo cardiomyopathy    Type II diabetes mellitus (HCC)    Ulcer of leg, chronic (HCC) 02/07/2021   Patient Active Problem List   Diagnosis Date Noted   Moderate major neurocognitive disorder due to another medical condition without behavioral disturbance (HCC) 06/19/2023   Sepsis secondary to UTI (HCC) 01/13/2023   Acute pyelonephritis 01/07/2023   T2DM (type 2 diabetes mellitus) (HCC) 01/07/2023   Septic  shock (HCC) 10/28/2022   Encephalopathy acute 10/28/2022   Sepsis due to Escherichia coli with encephalopathy and septic shock (HCC) 10/27/2022   UTI (urinary tract infection) 10/25/2022   Right knee pain 10/25/2022   Hyponatremia 10/25/2022   Leg wound, left 10/25/2022   Hyperlipidemia 10/25/2022   Obesity (BMI 30-39.9) 10/25/2022   Wheelchair dependence 05/30/2021   Ulcer of leg, chronic (HCC) 02/07/2021   Pure hypercholesterolemia 02/07/2021   Angina pectoris (HCC) 08/02/2016   Post-polio syndrome 07/20/2013            07/20/2013   Left patella fracture 07/18/2013   Hypothyroidism 03/12/2009   Controlled type 2 diabetes mellitus without complication, without long-term current use of insulin (HCC) 03/12/2009   Essential hypertension 03/12/2009   ATRIAL FIBRILLATION 03/12/2009   TAKOTSUBO SYNDROME 03/12/2009   Edema 03/12/2009   Home Medication(s) Prior to Admission medications   Medication Sig Start Date End Date Taking? Authorizing Provider  cefadroxil (DURICEF) 500 MG capsule Take 1 capsule (500 mg total) by mouth 2 (two) times daily for 7 days. 07/10/23 07/17/23 Yes Timoty Bourke, Amadeo Garnet, MD  albuterol (VENTOLIN HFA) 108 (90 Base) MCG/ACT inhaler Inhale 1-2 puffs into the lungs every 6 (six) hours as needed for wheezing or shortness of breath. 06/03/23   Dolphus Jenny, PA-C  amitriptyline (ELAVIL) 25 MG tablet Take 25 mg by mouth at bedtime.  [provider]  atorvastatin (LIPITOR) 40 MG tablet Take 40 mg by mouth daily.    [provider]  carvedilol (COREG) 25 MG tablet Take 25 mg by mouth 2 (two) times daily with a meal.    [provider]  cholecalciferol (VITAMIN D3) 25 MCG (1000 UNIT) tablet Take 1,000 Units by mouth daily.    [provider]  clonazePAM (KLONOPIN) 0.5 MG tablet Take 0.5 mg by mouth at bedtime.    [provider]  fluconazole (DIFLUCAN) 150 MG tablet Take 1 tablet (150 mg total) by mouth daily. 06/13/23   Achille Rich, PA-C  fluticasone (FLONASE) 50 MCG/ACT nasal spray Place 1 spray into both nostrils daily. 06/13/23   Achille Rich, PA-C  gabapentin (NEURONTIN) 300 MG capsule Take 300 mg by mouth 2 (two) times daily.    [provider]  hydrOXYzine (VISTARIL) 25 MG capsule Take 25 mg by mouth at bedtime. 12/31/22   [provider]  levothyroxine (SYNTHROID, LEVOTHROID) 100 MCG tablet Take 100 mcg by mouth daily before breakfast.    [provider]  loratadine (CLARITIN) 10 MG tablet Take 10 mg by mouth daily as needed for allergies. 12/17/19   [provider]  memantine (NAMENDA) 5 MG tablet Take 1 tablet (5 mg total) by mouth 2 (two) times daily. 06/19/23   Dohmeier, Porfirio Mylar, MD  metFORMIN (GLUCOPHAGE) 500 MG tablet Take 500 mg by mouth 2 (two) times daily.    [provider]  metroNIDAZOLE (FLAGYL) 500 MG tablet Take 1 tablet (500 mg total) by mouth 2 (two) times daily. 06/13/23   Achille Rich, PA-C  mirabegron ER (MYRBETRIQ) 50 MG TB24 tablet Take 50 mg by mouth daily.    [provider]  Multiple Vitamins-Minerals (MULTIVITAMIN WITH MINERALS) tablet Take 1 tablet by mouth daily.    [provider]  nitroGLYCERIN (NITROSTAT) 0.4 MG SL tablet Place 1 tablet (0.4 mg total) under the tongue every 5 (five) minutes as needed for chest pain. 12/07/22   Alver Sorrow, NP  pantoprazole (PROTONIX) 40 MG tablet Take 1 tablet by mouth daily. Take 1 tab daily 12/21/14   [provider]  telmisartan (MICARDIS) 80 MG tablet Take 40 mg by mouth daily. Pt takes half of a tab daily 12/14/14   [provider]  tiZANidine (ZANAFLEX) 4 MG tablet Take 4 mg by mouth at bedtime. PAIN    [provider]  torsemide (DEMADEX) 5 MG tablet Take 5 mg by mouth daily.    [provider]  zolpidem (AMBIEN) 5 MG tablet Take 5 mg by mouth at bedtime as needed for sleep.    [provider]                                                                                                                                     Allergies Tamsulosin, Ciprofloxacin, Carisoprodol, Oxycodone-acetaminophen, Penicillins, Sulfa antibiotics, Acetaminophen, Aspirin,  Macrodantin [nitrofurantoin macrocrystal], Nsaids, Percocet [oxycodone-acetaminophen], Propoxyphene, and Propoxyphene n-acetaminophen  Review of Systems Review of Systems As noted in HPI  Physical Exam Vital Signs  I have reviewed the triage vital signs BP (!) 140/88   Pulse (!) 51   Temp 98.1 F (36.7 C) (Oral)   Resp 18   Wt 82 kg   SpO2 95%   BMI 35.31 kg/m   Physical Exam Vitals reviewed.  Constitutional:      General: She is not in acute distress.    Appearance: She is well-developed. She is not diaphoretic.  HENT:     Head: Normocephalic and atraumatic.     Right Ear: External ear normal.     Left Ear: External ear normal.     Nose: Nose normal.  Eyes:     General: No scleral icterus.    Conjunctiva/sclera: Conjunctivae normal.  Neck:     Trachea: Phonation normal.  Cardiovascular:     Rate and Rhythm: Normal rate and regular rhythm.  Pulmonary:     Effort: Pulmonary effort is normal. No respiratory distress.     Breath sounds: No stridor.  Abdominal:     General: There is no distension.  Musculoskeletal:        General: Normal range of motion.     Cervical back: Normal range of motion.     Right knee: No swelling, erythema or ecchymosis. Tenderness present.     Left knee: No swelling, erythema or ecchymosis. Tenderness present.  Neurological:     Mental Status: She is alert and oriented to person, place, and time.  Psychiatric:        Behavior: Behavior normal.     ED Results and Treatments Labs (all labs ordered are listed, but only abnormal results are displayed) Labs Reviewed  CBC WITH DIFFERENTIAL/PLATELET - Abnormal; Notable for the following components:      Result Value   WBC 13.7 (*)    Platelets 444 (*)    Neutro Abs 8.0 (*)     Lymphs Abs 4.2 (*)    Monocytes Absolute 1.2 (*)    All other components within normal limits  BASIC METABOLIC PANEL - Abnormal; Notable for the following components:   Glucose, Bld 157 (*)    All other components within normal limits  URINALYSIS, W/ REFLEX TO CULTURE (INFECTION SUSPECTED) - Abnormal; Notable for the following components:   APPearance CLOUDY (*)    Hgb urine dipstick MODERATE (*)    Leukocytes,Ua LARGE (*)    Bacteria, UA RARE (*)    All other components within normal limits  URINE CULTURE                                                                                                                         EKG  EKG Interpretation Date/Time:    Ventricular Rate:    PR Interval:    QRS Duration:    QT Interval:    QTC Calculation:   R  Axis:      Text Interpretation:         Radiology No results found.  Medications Ordered in ED Medications  ketorolac (TORADOL) 15 MG/ML injection 15 mg (15 mg Intravenous Given 07/10/23 0441)   Procedures Procedures  (including critical care time) Medical Decision Making / ED Course   Medical Decision Making Amount and/or Complexity of Data Reviewed Labs: ordered. Decision-making details documented in ED Course.  Risk Prescription drug management.    Patient's blood pressure initially elevated.  Resolved on its own.  Patient does report constipation.  Also having urgency.  UA questionable for infection.  She was treated recently for UTI.  Will treat with Duricef and send for culture.  Knee pain.  Patient does not have swelling or redness to the knees.  Pain is baseline per patient.  Doubt septic arthritis.    Final Clinical Impression(s) / ED Diagnoses Final diagnoses:  Elevated blood pressure reading  Acute lower UTI   The patient appears reasonably screened and/or stabilized for discharge and I doubt any other medical condition or other [facility] requiring further screening, evaluation, or treatment in the ED at  this time. I have discussed the findings, Dx and Tx plan with the patient/family who expressed understanding and agree(s) with the plan. Discharge instructions discussed at length. The patient/family was given strict return precautions who verbalized understanding of the instructions. No further questions at time of discharge.  Disposition: Discharge  Condition: Good  ED Discharge Orders          Ordered    cefadroxil (DURICEF) 500 MG capsule  2 times daily        07/10/23 0402              Follow Up: Charlane Ferretti, DO [address] [place] [address] [phone]  Call  to schedule an appointment for close follow up     This chart was dictated using voice recognition software.  Despite best efforts to proofread,  errors can occur which can change the documentation meaning.    Nira Conn, MD 07/10/23 [phone]

## 2023-07-11 LAB — URINE CULTURE: Culture: NO GROWTH

## 2023-08-04 ENCOUNTER — Emergency Department (HOSPITAL_COMMUNITY)

## 2023-08-04 ENCOUNTER — Inpatient Hospital Stay (HOSPITAL_COMMUNITY)
Admission: EM | Admit: 2023-08-04 | Discharge: 2023-08-08 | DRG: 602 | Disposition: A | Discharge status: Home Health | Attending: Internal Medicine | Admitting: Internal Medicine

## 2023-08-04 ENCOUNTER — Other Ambulatory Visit: Payer: Self-pay

## 2023-08-04 ENCOUNTER — Encounter (HOSPITAL_COMMUNITY): Payer: Self-pay

## 2023-08-04 DIAGNOSIS — Z803 Family history of malignant neoplasm of breast: Secondary | ICD-10-CM

## 2023-08-04 DIAGNOSIS — S8991XA Unspecified injury of right lower leg, initial encounter: Secondary | ICD-10-CM | POA: Diagnosis present

## 2023-08-04 DIAGNOSIS — Z79890 Hormone replacement therapy (postmenopausal): Secondary | ICD-10-CM

## 2023-08-04 DIAGNOSIS — Z885 Allergy status to narcotic agent status: Secondary | ICD-10-CM

## 2023-08-04 DIAGNOSIS — F419 Anxiety disorder, unspecified: Secondary | ICD-10-CM | POA: Diagnosis present

## 2023-08-04 DIAGNOSIS — Z833 Family history of diabetes mellitus: Secondary | ICD-10-CM

## 2023-08-04 DIAGNOSIS — R001 Bradycardia, unspecified: Secondary | ICD-10-CM | POA: Diagnosis present

## 2023-08-04 DIAGNOSIS — Z820 Family history of epilepsy and other diseases of the nervous system: Secondary | ICD-10-CM

## 2023-08-04 DIAGNOSIS — E669 Obesity, unspecified: Secondary | ICD-10-CM | POA: Diagnosis present

## 2023-08-04 DIAGNOSIS — Z881 Allergy status to other antibiotic agents status: Secondary | ICD-10-CM

## 2023-08-04 DIAGNOSIS — G4730 Sleep apnea, unspecified: Secondary | ICD-10-CM | POA: Diagnosis present

## 2023-08-04 DIAGNOSIS — Z7401 Bed confinement status: Secondary | ICD-10-CM

## 2023-08-04 DIAGNOSIS — K219 Gastro-esophageal reflux disease without esophagitis: Secondary | ICD-10-CM | POA: Diagnosis present

## 2023-08-04 DIAGNOSIS — Z880 Allergy status to penicillin: Secondary | ICD-10-CM

## 2023-08-04 DIAGNOSIS — Z6841 Body Mass Index (BMI) 40.0 and over, adult: Secondary | ICD-10-CM

## 2023-08-04 DIAGNOSIS — Z79899 Other long term (current) drug therapy: Secondary | ICD-10-CM

## 2023-08-04 DIAGNOSIS — I110 Hypertensive heart disease with heart failure: Principal | ICD-10-CM | POA: Diagnosis present

## 2023-08-04 DIAGNOSIS — G8220 Paraplegia, unspecified: Secondary | ICD-10-CM | POA: Diagnosis present

## 2023-08-04 DIAGNOSIS — G14 Postpolio syndrome: Secondary | ICD-10-CM

## 2023-08-04 DIAGNOSIS — E11620 Type 2 diabetes mellitus with diabetic dermatitis: Secondary | ICD-10-CM | POA: Diagnosis present

## 2023-08-04 DIAGNOSIS — Z8249 Family history of ischemic heart disease and other diseases of the circulatory system: Secondary | ICD-10-CM

## 2023-08-04 DIAGNOSIS — Z9049 Acquired absence of other specified parts of digestive tract: Secondary | ICD-10-CM

## 2023-08-04 DIAGNOSIS — Z90710 Acquired absence of both cervix and uterus: Secondary | ICD-10-CM

## 2023-08-04 DIAGNOSIS — L03116 Cellulitis of left lower limb: Principal | ICD-10-CM | POA: Diagnosis present

## 2023-08-04 DIAGNOSIS — E039 Hypothyroidism, unspecified: Secondary | ICD-10-CM | POA: Diagnosis present

## 2023-08-04 DIAGNOSIS — Z87442 Personal history of urinary calculi: Secondary | ICD-10-CM

## 2023-08-04 DIAGNOSIS — L309 Dermatitis, unspecified: Secondary | ICD-10-CM | POA: Diagnosis present

## 2023-08-04 DIAGNOSIS — N319 Neuromuscular dysfunction of bladder, unspecified: Secondary | ICD-10-CM | POA: Diagnosis present

## 2023-08-04 DIAGNOSIS — L03119 Cellulitis of unspecified part of limb: Secondary | ICD-10-CM | POA: Diagnosis present

## 2023-08-04 DIAGNOSIS — Z7984 Long term (current) use of oral hypoglycemic drugs: Secondary | ICD-10-CM

## 2023-08-04 DIAGNOSIS — Z888 Allergy status to other drugs, medicaments and biological substances status: Secondary | ICD-10-CM

## 2023-08-04 DIAGNOSIS — E7800 Pure hypercholesterolemia, unspecified: Secondary | ICD-10-CM | POA: Diagnosis present

## 2023-08-04 DIAGNOSIS — L89312 Pressure ulcer of right buttock, stage 2: Secondary | ICD-10-CM | POA: Diagnosis present

## 2023-08-04 DIAGNOSIS — I5033 Acute on chronic diastolic (congestive) heart failure: Secondary | ICD-10-CM | POA: Diagnosis present

## 2023-08-04 DIAGNOSIS — E119 Type 2 diabetes mellitus without complications: Secondary | ICD-10-CM

## 2023-08-04 DIAGNOSIS — L8990 Pressure ulcer of unspecified site, unspecified stage: Secondary | ICD-10-CM | POA: Insufficient documentation

## 2023-08-04 DIAGNOSIS — I4891 Unspecified atrial fibrillation: Secondary | ICD-10-CM | POA: Diagnosis present

## 2023-08-04 DIAGNOSIS — M7989 Other specified soft tissue disorders: Secondary | ICD-10-CM | POA: Diagnosis present

## 2023-08-04 DIAGNOSIS — M545 Low back pain, unspecified: Secondary | ICD-10-CM | POA: Diagnosis present

## 2023-08-04 DIAGNOSIS — G8929 Other chronic pain: Secondary | ICD-10-CM | POA: Diagnosis present

## 2023-08-04 DIAGNOSIS — I10 Essential (primary) hypertension: Secondary | ICD-10-CM | POA: Diagnosis present

## 2023-08-04 DIAGNOSIS — L89322 Pressure ulcer of left buttock, stage 2: Secondary | ICD-10-CM | POA: Diagnosis present

## 2023-08-04 DIAGNOSIS — X58XXXA Exposure to other specified factors, initial encounter: Secondary | ICD-10-CM | POA: Diagnosis present

## 2023-08-04 DIAGNOSIS — F319 Bipolar disorder, unspecified: Secondary | ICD-10-CM | POA: Diagnosis present

## 2023-08-04 NOTE — ED Notes (Signed)
 Attempted 2nd PIV, unsuccesful.

## 2023-08-04 NOTE — ED Triage Notes (Signed)
 Pt BIB [facility] from home. Called out for [name], pt reports feeling SHOB all day. EMS reports O2 sats have been fine, upon their arrival pt was on O2, not on O2 at baseline she reports it was her husbands. She also has cellulitis on chin from wound back in Dec 2024. A&Ox4  Hx polio, Afib   50-100 HR 176/122 BP  97% RA 126 CBG 22 L Wrist

## 2023-08-04 NOTE — ED Provider Notes (Signed)
 [facility]Y DEPT [facility] Emergency Department Provider Note MRN:  [id number]  Arrival date & time: 08/05/23     Chief Complaint   Shortness of Breath   History of Present Illness   [name] is a 78 y.o. year-old female presents to the ED with chief complaint of SOB.  Hx of CHF, afib (not noted to be anticoagulated), post polio paralysis.  Reports worsening SOB today.  Doesn't wear O2 at home.  Reports that she has had increased leg swelling in the right leg.  Has an old ulcer, but probable new cellulitis.  States that she just hasn't felt good all day.  History provided by patient.   Review of Systems  Pertinent positive and negative review of systems noted in HPI.    Physical Exam   Vitals:   08/05/23 0100 08/05/23 0130  BP: (!) 120/103 (!) 169/74  Pulse: 60 (!) 105  Resp: 19 17  Temp:    SpO2: 100% 100%    CONSTITUTIONAL:  chronically ill-appearing, NAD NEURO:  Alert and oriented x 3, CN 3-12 grossly intact EYES:  eyes equal and reactive ENT/NECK:  Supple, no stridor  CARDIO:  normal rate, regular rhythm, appears well-perfused  PULM:  No respiratory distress, CTAB GI/GU:  non-distended,  MSK/SPINE:  there is edema and erythema to the left lower extremity that looks more consistent with cellulitis from a skin tear SKIN:  no rash, atraumatic   *Additional and/or pertinent findings included in MDM below  Diagnostic and Interventional Summary    EKG Interpretation Date/Time:  Saturday August 04 2023 22:37:03 EDT Ventricular Rate:  52 PR Interval:  138 QRS Duration:  91 QT Interval:  460 QTC Calculation: 428 R Axis:   15  Text Interpretation: Sinus rhythm Atrial premature complexes Consider right atrial enlargement Borderline T abnormalities, anterior leads Confirmed by Zadie Rhine ([id number]) on 08/04/2023 11:33:15 PM       Labs Reviewed  COMPREHENSIVE METABOLIC PANEL - Abnormal; Notable for the following components:      Result Value    Sodium 134 (*)    Glucose, Bld 111 (*)    Total Protein 6.3 (*)    Albumin 3.2 (*)    All other components within normal limits  CBC WITH DIFFERENTIAL/PLATELET - Abnormal; Notable for the following components:   WBC 11.5 (*)    All other components within normal limits  BRAIN NATRIURETIC PEPTIDE - Abnormal; Notable for the following components:   B Natriuretic Peptide 364.2 (*)    All other components within normal limits  I-STAT CG4 LACTIC ACID, ED - Abnormal; Notable for the following components:   Lactic Acid, Venous 2.5 (*)    All other components within normal limits  CULTURE, BLOOD (ROUTINE X 2)  CULTURE, BLOOD (ROUTINE X 2)  RESP PANEL BY RT-PCR (RSV, FLU A&B, COVID)  RVPGX2  PROTIME-INR  APTT  URINALYSIS, W/ REFLEX TO CULTURE (INFECTION SUSPECTED)  I-STAT CG4 LACTIC ACID, ED  TROPONIN I (HIGH SENSITIVITY)  TROPONIN I (HIGH SENSITIVITY)    DG Chest Port 1 View  Final Result    DG Tibia/Fibula Left  Final Result    DG Foot Complete Right  Final Result      Medications  vancomycin (VANCOREADY) IVPB 1500 mg/300 mL (1,500 mg Intravenous New Bag/Given 08/05/23 0145)     Procedures  /  Critical Care Procedures  ED Course and Medical Decision Making  I have reviewed the triage vital signs, the nursing notes, and pertinent available records from  the EMR.  Social Determinants Affecting Complexity of Care: Patient has no clinically significant social determinants affecting this chief complaint..   ED Course: Clinical Course as of 08/05/23 0204  Wynelle Link Aug 05, 2023  0201 I-Stat Lactic Acid, ED(!!) Elevated lactic, but not hypotensive [RB]  0201 Brain natriuretic peptide(!) BNP elevated, higher than recents, might have an element of CHF, not hypoxic and CXR negative for edema, but may need some diuresis [RB]    Clinical Course User Index [RB] Roxy Horseman, PA-C    Medical Decision Making Patient here complaint of fatigue and SOB.  Noted to have cellulitis to  the LLE.  Will start IV vanc.  Will get CXR and labs.    Patient seen by and discussed with Dr. Bebe Shaggy, who agrees with plan for admission.  Amount and/or Complexity of Data Reviewed Labs: ordered. Decision-making details documented in ED Course. Radiology: ordered.  Risk Prescription drug management. Decision regarding hospitalization.         Consultants: I consulted with Hospitalist, Dr. Margo Aye, who is appreciated for admitting.   Treatment and Plan: Patient's exam and diagnostic results are concerning for cellulitis.  Feel that patient will need admission to the hospital for further treatment and evaluation.    Final Clinical Impressions(s) / ED Diagnoses     ICD-10-CM   1. Cellulitis of left lower extremity  L03.116       ED Discharge Orders     None         Discharge Instructions Discussed with and Provided to Patient:   Discharge Instructions   None      Roxy Horseman, PA-C 08/05/23 2841    Zadie Rhine, MD 08/05/23 [phone]

## 2023-08-05 DIAGNOSIS — E039 Hypothyroidism, unspecified: Secondary | ICD-10-CM | POA: Diagnosis present

## 2023-08-05 DIAGNOSIS — Z9049 Acquired absence of other specified parts of digestive tract: Secondary | ICD-10-CM | POA: Diagnosis not present

## 2023-08-05 DIAGNOSIS — L03116 Cellulitis of left lower limb: Secondary | ICD-10-CM

## 2023-08-05 DIAGNOSIS — L89312 Pressure ulcer of right buttock, stage 2: Secondary | ICD-10-CM | POA: Diagnosis present

## 2023-08-05 DIAGNOSIS — I5033 Acute on chronic diastolic (congestive) heart failure: Secondary | ICD-10-CM | POA: Diagnosis present

## 2023-08-05 DIAGNOSIS — E11620 Type 2 diabetes mellitus with diabetic dermatitis: Secondary | ICD-10-CM | POA: Diagnosis present

## 2023-08-05 DIAGNOSIS — E7800 Pure hypercholesterolemia, unspecified: Secondary | ICD-10-CM | POA: Diagnosis present

## 2023-08-05 DIAGNOSIS — M7989 Other specified soft tissue disorders: Secondary | ICD-10-CM | POA: Diagnosis present

## 2023-08-05 DIAGNOSIS — X58XXXA Exposure to other specified factors, initial encounter: Secondary | ICD-10-CM | POA: Diagnosis present

## 2023-08-05 DIAGNOSIS — G14 Postpolio syndrome: Secondary | ICD-10-CM | POA: Diagnosis present

## 2023-08-05 DIAGNOSIS — E119 Type 2 diabetes mellitus without complications: Secondary | ICD-10-CM | POA: Diagnosis not present

## 2023-08-05 DIAGNOSIS — Z880 Allergy status to penicillin: Secondary | ICD-10-CM | POA: Diagnosis not present

## 2023-08-05 DIAGNOSIS — R609 Edema, unspecified: Secondary | ICD-10-CM | POA: Diagnosis not present

## 2023-08-05 DIAGNOSIS — F319 Bipolar disorder, unspecified: Secondary | ICD-10-CM | POA: Diagnosis present

## 2023-08-05 DIAGNOSIS — Z90710 Acquired absence of both cervix and uterus: Secondary | ICD-10-CM | POA: Diagnosis not present

## 2023-08-05 DIAGNOSIS — L89322 Pressure ulcer of left buttock, stage 2: Secondary | ICD-10-CM | POA: Diagnosis present

## 2023-08-05 DIAGNOSIS — I110 Hypertensive heart disease with heart failure: Secondary | ICD-10-CM | POA: Diagnosis present

## 2023-08-05 DIAGNOSIS — G8929 Other chronic pain: Secondary | ICD-10-CM | POA: Diagnosis present

## 2023-08-05 DIAGNOSIS — R001 Bradycardia, unspecified: Secondary | ICD-10-CM | POA: Diagnosis present

## 2023-08-05 DIAGNOSIS — Z885 Allergy status to narcotic agent status: Secondary | ICD-10-CM | POA: Diagnosis not present

## 2023-08-05 DIAGNOSIS — Z79890 Hormone replacement therapy (postmenopausal): Secondary | ICD-10-CM | POA: Diagnosis not present

## 2023-08-05 DIAGNOSIS — G8220 Paraplegia, unspecified: Secondary | ICD-10-CM | POA: Diagnosis present

## 2023-08-05 DIAGNOSIS — Z881 Allergy status to other antibiotic agents status: Secondary | ICD-10-CM | POA: Diagnosis not present

## 2023-08-05 DIAGNOSIS — N319 Neuromuscular dysfunction of bladder, unspecified: Secondary | ICD-10-CM | POA: Diagnosis present

## 2023-08-05 DIAGNOSIS — L03119 Cellulitis of unspecified part of limb: Secondary | ICD-10-CM | POA: Diagnosis present

## 2023-08-05 DIAGNOSIS — Z6841 Body Mass Index (BMI) 40.0 and over, adult: Secondary | ICD-10-CM | POA: Diagnosis not present

## 2023-08-05 LAB — I-STAT CG4 LACTIC ACID, ED
Lactic Acid, Venous: 1.8 mmol/L (ref 0.5–1.9)
Lactic Acid, Venous: 2.5 mmol/L (ref 0.5–1.9)

## 2023-08-05 LAB — CBC WITH DIFFERENTIAL/PLATELET
Abs Immature Granulocytes: 0.03 10*3/uL (ref 0.00–0.07)
Basophils Absolute: 0.1 10*3/uL (ref 0.0–0.1)
Basophils Relative: 1 %
Eosinophils Absolute: 0.5 10*3/uL (ref 0.0–0.5)
Eosinophils Relative: 4 %
HCT: 39.6 % (ref 36.0–46.0)
Hemoglobin: 13.3 g/dL (ref 12.0–15.0)
Immature Granulocytes: 0 %
Lymphocytes Relative: 33 %
Lymphs Abs: 3.8 10*3/uL (ref 0.7–4.0)
MCH: 32 pg (ref 26.0–34.0)
MCHC: 33.6 g/dL (ref 30.0–36.0)
MCV: 95.2 fL (ref 80.0–100.0)
Monocytes Absolute: 0.8 10*3/uL (ref 0.1–1.0)
Monocytes Relative: 7 %
Neutro Abs: 6.4 10*3/uL (ref 1.7–7.7)
Neutrophils Relative %: 55 %
Platelets: 281 10*3/uL (ref 150–400)
RBC: 4.16 MIL/uL (ref 3.87–5.11)
RDW: 14.9 % (ref 11.5–15.5)
WBC: 11.5 10*3/uL — ABNORMAL HIGH (ref 4.0–10.5)
nRBC: 0 % (ref 0.0–0.2)

## 2023-08-05 LAB — COMPREHENSIVE METABOLIC PANEL
ALT: 26 U/L (ref 0–44)
AST: 31 U/L (ref 15–41)
Albumin: 3.2 g/dL — ABNORMAL LOW (ref 3.5–5.0)
Alkaline Phosphatase: 54 U/L (ref 38–126)
Anion gap: 10 (ref 5–15)
BUN: 12 mg/dL (ref 8–23)
CO2: 24 mmol/L (ref 22–32)
Calcium: 9.3 mg/dL (ref 8.9–10.3)
Chloride: 100 mmol/L (ref 98–111)
Creatinine, Ser: 0.46 mg/dL (ref 0.44–1.00)
GFR, Estimated: >60 mL/min (ref >60)
Glucose, Bld: 111 mg/dL — ABNORMAL HIGH (ref 70–99)
Potassium: 4.3 mmol/L (ref 3.5–5.1)
Sodium: 134 mmol/L — ABNORMAL LOW (ref 135–145)
Total Bilirubin: 1.1 mg/dL (ref 0.0–1.2)
Total Protein: 6.3 g/dL — ABNORMAL LOW (ref 6.5–8.1)

## 2023-08-05 LAB — RESP PANEL BY RT-PCR (RSV, FLU A&B, COVID)  RVPGX2
Influenza A by PCR: NEGATIVE
Influenza B by PCR: NEGATIVE
Resp Syncytial Virus by PCR: NEGATIVE
SARS Coronavirus 2 by RT PCR: NEGATIVE

## 2023-08-05 LAB — URINALYSIS, W/ REFLEX TO CULTURE (INFECTION SUSPECTED)
Bilirubin Urine: NEGATIVE
Glucose, UA: NEGATIVE mg/dL
Hgb urine dipstick: NEGATIVE
Ketones, ur: NEGATIVE mg/dL
Nitrite: NEGATIVE
Protein, ur: NEGATIVE mg/dL
RBC / HPF: NONE SEEN RBC/hpf (ref 0–5)
Specific Gravity, Urine: 1.01 (ref 1.005–1.030)
pH: 6 (ref 5.0–8.0)

## 2023-08-05 LAB — PROTIME-INR
INR: 1 (ref 0.8–1.2)
Prothrombin Time: 13.3 s (ref 11.4–15.2)

## 2023-08-05 LAB — APTT: aPTT: 25 s (ref 24–36)

## 2023-08-05 LAB — BRAIN NATRIURETIC PEPTIDE: B Natriuretic Peptide: 364.2 pg/mL — ABNORMAL HIGH (ref 0.0–100.0)

## 2023-08-05 LAB — TROPONIN I (HIGH SENSITIVITY)
Troponin I (High Sensitivity): 4 ng/L (ref <18)
Troponin I (High Sensitivity): 4 ng/L (ref <18)

## 2023-08-05 LAB — MRSA NEXT GEN BY PCR, NASAL: MRSA by PCR Next Gen: NOT DETECTED

## 2023-08-05 MED ORDER — PANTOPRAZOLE SODIUM 40 MG PO TBEC
40.0000 mg | DELAYED_RELEASE_TABLET | Freq: Every day | ORAL | Status: DC
  Administered 2023-08-05 – 2023-08-08 (×4): 40 mg via ORAL
  Filled 2023-08-05 (×4): qty 1

## 2023-08-05 MED ORDER — TORSEMIDE 10 MG PO TABS: 5.0000 mg | ORAL_TABLET | Freq: Every day | ORAL | Status: DC

## 2023-08-05 MED ORDER — SODIUM CHLORIDE 0.9 % IV SOLN
2.0000 g | Freq: Every day | INTRAVENOUS | Status: DC
  Administered 2023-08-05: 2 g via INTRAVENOUS
  Filled 2023-08-05: qty 20

## 2023-08-05 MED ORDER — MIRABEGRON ER 50 MG PO TB24
50.0000 mg | ORAL_TABLET | Freq: Every day | ORAL | Status: DC
  Administered 2023-08-05 – 2023-08-08 (×4): 50 mg via ORAL
  Filled 2023-08-05 (×5): qty 1

## 2023-08-05 MED ORDER — POLYETHYLENE GLYCOL 3350 17 G PO PACK: 17.0000 g | PACK | Freq: Every day | ORAL | Status: DC | PRN

## 2023-08-05 MED ORDER — ORAL CARE MOUTH RINSE: 15.0000 mL | OROMUCOSAL | Status: DC | PRN

## 2023-08-05 MED ORDER — LEVOTHYROXINE SODIUM 100 MCG PO TABS
100.0000 ug | ORAL_TABLET | Freq: Every day | ORAL | Status: DC
  Administered 2023-08-05 – 2023-08-08 (×4): 100 ug via ORAL
  Filled 2023-08-05 (×4): qty 1

## 2023-08-05 MED ORDER — ZOLPIDEM TARTRATE 5 MG PO TABS
5.0000 mg | ORAL_TABLET | Freq: Every evening | ORAL | Status: DC | PRN
  Administered 2023-08-05 – 2023-08-07 (×3): 5 mg via ORAL
  Filled 2023-08-05 (×3): qty 1

## 2023-08-05 MED ORDER — ENOXAPARIN SODIUM 60 MG/0.6ML IJ SOSY
50.0000 mg | PREFILLED_SYRINGE | Freq: Every day | INTRAMUSCULAR | Status: DC
Start: 2023-08-05 — End: 2023-08-08
  Administered 2023-08-05 – 2023-08-08 (×4): 50 mg via SUBCUTANEOUS
  Filled 2023-08-05 (×4): qty 0.6

## 2023-08-05 MED ORDER — CEFADROXIL 500 MG PO CAPS
1000.0000 mg | ORAL_CAPSULE | Freq: Two times a day (BID) | ORAL | Status: DC
  Administered 2023-08-05 – 2023-08-08 (×7): 1000 mg via ORAL
  Filled 2023-08-05 (×7): qty 2

## 2023-08-05 MED ORDER — HYDRALAZINE HCL 20 MG/ML IJ SOLN: 5.0000 mg | Freq: Four times a day (QID) | INTRAMUSCULAR | Status: DC | PRN

## 2023-08-05 MED ORDER — CARVEDILOL 12.5 MG PO TABS: 25.0000 mg | ORAL_TABLET | Freq: Two times a day (BID) | ORAL | Status: DC

## 2023-08-05 MED ORDER — ASPIRIN 81 MG PO TBEC
81.0000 mg | DELAYED_RELEASE_TABLET | Freq: Every evening | ORAL | Status: DC
  Administered 2023-08-05 – 2023-08-07 (×3): 81 mg via ORAL
  Filled 2023-08-05 (×3): qty 1

## 2023-08-05 MED ORDER — FERROUS SULFATE 325 (65 FE) MG PO TABS
325.0000 mg | ORAL_TABLET | Freq: Every day | ORAL | Status: DC
  Administered 2023-08-05 – 2023-08-08 (×4): 325 mg via ORAL
  Filled 2023-08-05 (×4): qty 1

## 2023-08-05 MED ORDER — VANCOMYCIN HCL 1500 MG/300ML IV SOLN
1500.0000 mg | Freq: Once | INTRAVENOUS | Status: AC
  Administered 2023-08-05: 1500 mg via INTRAVENOUS
  Filled 2023-08-05: qty 300

## 2023-08-05 MED ORDER — GABAPENTIN 100 MG PO CAPS
200.0000 mg | ORAL_CAPSULE | Freq: Once | ORAL | Status: AC
  Administered 2023-08-05: 200 mg via ORAL
  Filled 2023-08-05: qty 2

## 2023-08-05 MED ORDER — FUROSEMIDE 10 MG/ML IJ SOLN
40.0000 mg | Freq: Every day | INTRAMUSCULAR | Status: DC
  Administered 2023-08-06: 40 mg via INTRAVENOUS
  Filled 2023-08-05: qty 4

## 2023-08-05 MED ORDER — ENOXAPARIN SODIUM 40 MG/0.4ML IJ SOSY: 40.0000 mg | PREFILLED_SYRINGE | Freq: Every day | INTRAMUSCULAR | Status: DC

## 2023-08-05 MED ORDER — AMITRIPTYLINE HCL 25 MG PO TABS
25.0000 mg | ORAL_TABLET | Freq: Every day | ORAL | Status: DC
  Administered 2023-08-05 – 2023-08-06 (×2): 25 mg via ORAL
  Filled 2023-08-05 (×2): qty 1

## 2023-08-05 MED ORDER — ATORVASTATIN CALCIUM 40 MG PO TABS
40.0000 mg | ORAL_TABLET | Freq: Every day | ORAL | Status: DC
  Administered 2023-08-05 – 2023-08-07 (×3): 40 mg via ORAL
  Filled 2023-08-05 (×3): qty 1

## 2023-08-05 MED ORDER — FUROSEMIDE 10 MG/ML IJ SOLN
40.0000 mg | Freq: Once | INTRAMUSCULAR | Status: AC
  Administered 2023-08-05: 40 mg via INTRAVENOUS
  Filled 2023-08-05: qty 4

## 2023-08-05 MED ORDER — FUROSEMIDE 10 MG/ML IJ SOLN
20.0000 mg | Freq: Two times a day (BID) | INTRAMUSCULAR | Status: DC
  Administered 2023-08-05: 20 mg via INTRAVENOUS
  Filled 2023-08-05: qty 2

## 2023-08-05 MED ORDER — MELATONIN 5 MG PO TABS: 5.0000 mg | ORAL_TABLET | Freq: Every evening | ORAL | Status: DC | PRN

## 2023-08-05 MED ORDER — PROCHLORPERAZINE EDISYLATE 10 MG/2ML IJ SOLN: 5.0000 mg | Freq: Four times a day (QID) | INTRAMUSCULAR | Status: DC | PRN

## 2023-08-05 MED ORDER — MEMANTINE HCL 10 MG PO TABS
5.0000 mg | ORAL_TABLET | Freq: Two times a day (BID) | ORAL | Status: DC
  Administered 2023-08-05 – 2023-08-07 (×5): 5 mg via ORAL
  Filled 2023-08-05 (×5): qty 1

## 2023-08-05 MED ORDER — CLONAZEPAM 0.5 MG PO TABS
0.5000 mg | ORAL_TABLET | Freq: Two times a day (BID) | ORAL | Status: DC | PRN
  Administered 2023-08-05 – 2023-08-06 (×2): 0.5 mg via ORAL
  Filled 2023-08-05 (×2): qty 1

## 2023-08-05 MED ORDER — TIZANIDINE HCL 4 MG PO TABS
4.0000 mg | ORAL_TABLET | Freq: Every day | ORAL | Status: DC
  Administered 2023-08-05 – 2023-08-07 (×3): 4 mg via ORAL
  Filled 2023-08-05 (×3): qty 1

## 2023-08-05 MED ORDER — TRAMADOL HCL 50 MG PO TABS
50.0000 mg | ORAL_TABLET | Freq: Four times a day (QID) | ORAL | Status: DC | PRN
  Administered 2023-08-05 – 2023-08-08 (×6): 50 mg via ORAL
  Filled 2023-08-05 (×6): qty 1

## 2023-08-05 NOTE — Progress Notes (Signed)
 ED Pharmacy Antibiotic Sign Off An antibiotic consult was received from an ED provider for Vancomycin  per pharmacy dosing for cellulitis. A chart review was completed to assess appropriateness.   The following one time order(s) were placed:  Vancomycin 1500 mg IV  Further antibiotic and/or antibiotic pharmacy consults should be ordered by the admitting provider if indicated.   Eddie Candle, [facility]  Clinical Pharmacist 08/05/23 12:41 AM

## 2023-08-05 NOTE — Progress Notes (Signed)
 [facility] Progress Note Patient: [name] NWG:[id number] DOB: 02-19-1946 DOA: 08/04/2023  DOS: the patient was seen and examined on 08/05/2023  Brief Hospital Course: PMH of HLD, type II DM, hypothyroidism, postpolio syndrome with chronic paraplegia and bedridden, neurogenic bladder, presented to hospital with complaints of shortness of breath as well as left leg redness. Currently being treated for cellulitis and possible acute on chronic HFpEF.  Assessment and Plan: Recurrent left leg cellulitis. Patient has history of chronic venous dermatitis. Suspect the presentation is secondary to volume overload leading to skin injury leading to cellulitis. Currently receiving IV antibiotic. Low threshold to switch to p.o.  Acute on chronic HFpEF. Echocardiogram shows EF of 65 to 70%. Currently receiving IV Lasix. Will monitor output.  HTN. Blood pressure mildly elevated.  Continue home regimen.  Sinus bradycardia. Will continue to hold beta-blockers. Monitor on telemetry.  HLD. Continue statin.  Hypothyroidism.   On Synthroid.  Check TSH and free T4 as could be related to volume overload.  Chronic pain. Bipolar disorder. Neurogenic bladder. On amitriptyline, clonazepam, Namenda, gabapentin.  Zanaflex. Continue the same.  GERD. Getting PPI.  Chronic leg swelling. Check lower extremity Doppler to rule out DVT.  Right leg injury. X-ray unremarkable.  Monitor.     Subjective: No nausea no vomiting no fever no chills.  Continues to have swelling of the leg.  Some tenderness on her left leg she and reported.  Right leg head injury that she sustained while moving her wheelchair.   Physical Exam: General: in Mild distress, No Rash Cardiovascular: S1 and S2 Present, No Murmur Respiratory: Good respiratory effort, Bilateral Air entry present. No Crackles, No wheezes Abdomen: Bowel Sound present, No tenderness Extremities: Bilateral edema, warmth and redness as well as  tenderness involving the left leg area Neuro: Alert and oriented x3, no new focal deficit  Data Reviewed: I have Reviewed nursing notes, Vitals, and Lab results. Since last encounter, pertinent lab results CBC and BMP   . I have ordered test including CBC and BMP  . I have ordered imaging lower extremity Doppler  .   Disposition: Status is: Inpatient Remains inpatient appropriate because: Needing diuresis  Place and maintain sequential compression device Start: 08/05/23 1333 Place TED hose Start: 08/05/23 1332   Family Communication: Family at bedside Level of care: Telemetry Medical   Vitals:   08/05/23 0635 08/05/23 0837 08/05/23 0930 08/05/23 1709  BP: 104/67 (!) 159/55  (!) 168/44  Pulse: (!) 53 (!) 47  (!) 52  Resp: 20 20  20   Temp: 97.8 F (36.6 C) 97.8 F (36.6 C)  97.8 F (36.6 C)  TempSrc: Oral Oral  Oral  SpO2: 100% 98%  97%  Weight: 96.7 kg     Height:   5' (1.524 m)      Author: Lynden Oxford, MD 08/05/2023 7:53 PM  Please look on [email or web address] to find out who is on call.

## 2023-08-05 NOTE — H&P (Addendum)
 History and Physical  [name]:[id number] DOB: Jan 12, 1946 DOA: 08/04/2023  Referring physician: Vertis Kelch  PCP: Charlane Ferretti, DO  Outpatient Specialists: Neurology, urology Patient coming from: Home.  Chief Complaint: Shortness of breath, left leg wound with pain  HPI: [name] is a 78 y.o. female with medical history significant for hyperlipidemia, type 2 diabetes, hypothyroidism, post polio syndrome, neurogenic bladder disorder, urge incontinence of urine, paraparesis, who presents to the ER with complaints of shortness of breath and left lower extremity swelling, redness, and pain.  No reported subjective fevers.  Admits to chills.  Associated with feeling generally unwell.  EMS was activated.  In the ER, on physical exam, left lower extremity appears cellulitic, erythematous, edematous, warm, and tender.  The patient is started on IV antibiotics empirically for cellulitis.  Chest x-ray showed no active disease.  Lab work was notable for mildly elevated WBC 11.5.  BNP 364.  TRH was asked to admit for further management of cellulitis and acute on chronic HFpEF.  ED Course: Temperature 97.9.  BP 136/100, pulse 105, respiratory 16, O2 saturation 98% on room air.  Lab studies notable for sodium 134, albumin 3.3, BNP 364.  WBC 11.5, hemoglobin 13.3, platelet count 281.   Review of Systems: Review of systems as noted in the HPI. All other systems reviewed and are negative.   Past Medical History:  Diagnosis Date   Angina pectoris (HCC) 08/02/2016   Anxiety    Arthritis    "hands, knees, back" (07/18/2013)   Asthma    CHF (congestive heart failure) (HCC)    Chronic back pain    Chronic lower back pain    Family history of anesthesia complication    Mother and Sister- N/V   GERD (gastroesophageal reflux disease)    H/O hiatal hernia    Headache(784.0)    "weekly" (07/18/2013)   Hypertension    Hypothyroidism    Insomnia    Kidney stones    Memory changes     Mental disorder    Migraine    "years ago" (07/18/2013)   Osteopenia    Post-polio syndrome    Pure hypercholesterolemia 02/07/2021   Sleep apnea    Takotsubo cardiomyopathy    Type II diabetes mellitus (HCC)    Ulcer of leg, chronic (HCC) 02/07/2021   Past Surgical History:  Procedure Laterality Date   ABDOMINAL HYSTERECTOMY     APPENDECTOMY     CARPAL TUNNEL RELEASE Bilateral    CHOLECYSTECTOMY     COLONOSCOPY     CYSTOSCOPY     IR INJECT/THERA/INC NEEDLE/CATH/PLC EPI/LUMB/SAC W/IMG  07/19/2022   IR INJECT/THERA/INC NEEDLE/CATH/PLC EPI/LUMB/SAC W/IMG  09/11/2022   IR INJECT/THERA/INC NEEDLE/CATH/PLC EPI/LUMB/SAC W/IMG  12/26/2022   KNEE ARTHROSCOPY Left    KNEE SURGERY Left    LEG SURGERY Left    polio   ORIF PATELLA Left 07/18/2013   Procedure: OPEN REDUCTION INTERNAL (ORIF) LEFT FIXATION PATELLA;  Surgeon: Verlee Rossetti, MD;  Location: [facility] OR;  Service: Orthopedics;  Laterality: Left;   ORIF PATELLA FRACTURE Left 07/18/2013   TUBAL LIGATION     WISDOM TOOTH EXTRACTION     WRIST TENDON TRANSFER Right     Social History:  reports that she has never smoked. She has never used smokeless tobacco. She reports that she does not drink alcohol and does not use drugs.   Allergies  Allergen Reactions   Tamsulosin Other (See Comments)    Caused uncontrollable movements   Ciprofloxacin Rash  Carisoprodol Itching    Other Reaction(s): Unknown   Oxycodone-Acetaminophen     Other Reaction(s): ITCHING   Penicillins Itching    Pt has tolerated rocephin IV  Other Reaction(s): ITCHING   Sulfa Antibiotics Other (See Comments)    Unknown.  States as always told had an allergy  Other Reaction(s): Unknown   Acetaminophen Itching    Other Reaction(s): ITCHING   Aspirin Itching and Other (See Comments)    Chest pain, also   Macrodantin [Nitrofurantoin Macrocrystal] Rash   Nsaids Other (See Comments)    Chest pain and stomach pain  Other Reaction(s): Unknown   Percocet  [Oxycodone-Acetaminophen] Itching   Propoxyphene Other (See Comments)    Chest/stomach pain  Other Reaction(s): ITCHING   Propoxyphene N-Acetaminophen Other (See Comments)    Chest/stomach pain    Family History  Problem Relation Age of Onset   Heart failure Mother    Parkinson's disease Mother    Diabetes Father    Cancer Father    Heart failure Father    Breast cancer Maternal Grandmother    Allergies Grandchild       Prior to Admission medications   Medication Sig Start Date End Date Taking? Authorizing Provider  albuterol (VENTOLIN HFA) 108 (90 Base) MCG/ACT inhaler Inhale 1-2 puffs into the lungs every 6 (six) hours as needed for wheezing or shortness of breath. 06/03/23   Dolphus Jenny, PA-C  amitriptyline (ELAVIL) 25 MG tablet Take 25 mg by mouth at bedtime.    [provider]  atorvastatin (LIPITOR) 40 MG tablet Take 40 mg by mouth daily.    [provider]  carvedilol (COREG) 25 MG tablet Take 25 mg by mouth 2 (two) times daily with a meal.    [provider]  cholecalciferol (VITAMIN D3) 25 MCG (1000 UNIT) tablet Take 1,000 Units by mouth daily.    [provider]  clonazePAM (KLONOPIN) 0.5 MG tablet Take 0.5 mg by mouth at bedtime.    [provider]  fluconazole (DIFLUCAN) 150 MG tablet Take 1 tablet (150 mg total) by mouth daily. 06/13/23   Achille Rich, PA-C  fluticasone (FLONASE) 50 MCG/ACT nasal spray Place 1 spray into both nostrils daily. 06/13/23   Achille Rich, PA-C  gabapentin (NEURONTIN) 300 MG capsule Take 300 mg by mouth 2 (two) times daily.    [provider]  hydrOXYzine (VISTARIL) 25 MG capsule Take 25 mg by mouth at bedtime. 12/31/22   [provider]  levothyroxine (SYNTHROID, LEVOTHROID) 100 MCG tablet Take 100 mcg by mouth daily before breakfast.    [provider]  loratadine (CLARITIN) 10 MG tablet Take 10 mg by mouth daily as needed for allergies. 12/17/19   [provider]  memantine (NAMENDA) 5 MG tablet Take 1 tablet (5 mg total) by mouth 2 (two) times daily. 06/19/23   Dohmeier, Porfirio Mylar, MD  metFORMIN (GLUCOPHAGE) 500 MG tablet Take 500 mg by mouth 2 (two) times daily.    [provider]  metroNIDAZOLE (FLAGYL) 500 MG tablet Take 1 tablet (500 mg total) by mouth 2 (two) times daily. 06/13/23   Achille Rich, PA-C  mirabegron ER (MYRBETRIQ) 50 MG TB24 tablet Take 50 mg by mouth daily.    [provider]  Multiple Vitamins-Minerals (MULTIVITAMIN WITH MINERALS) tablet Take 1 tablet by mouth daily.    [provider]  nitroGLYCERIN (NITROSTAT) 0.4 MG SL tablet Place 1 tablet (0.4 mg total) under the tongue every 5 (five) minutes as needed for  chest pain. 12/07/22   Alver Sorrow, NP  pantoprazole (PROTONIX) 40 MG tablet Take 1 tablet by mouth daily. Take 1 tab daily 12/21/14   [provider]  telmisartan (MICARDIS) 80 MG tablet Take 40 mg by mouth daily. Pt takes half of a tab daily 12/14/14   [provider]  tiZANidine (ZANAFLEX) 4 MG tablet Take 4 mg by mouth at bedtime. PAIN    [provider]  torsemide (DEMADEX) 5 MG tablet Take 5 mg by mouth daily.    [provider]  zolpidem (AMBIEN) 5 MG tablet Take 5 mg by mouth at bedtime as needed for sleep.    [provider]    Physical Exam: BP (!) 169/74   Pulse (!) 105   Temp 97.9 F (36.6 C) (Oral)   Resp 17   SpO2 100%   General: 78 y.o. year-old female well developed well nourished in no acute distress.  Alert and oriented x3. Cardiovascular: Regular rate and rhythm with no rubs or gallops.  No thyromegaly or JVD noted.  2+ pitting edema in lower extremities bilaterally. Respiratory: Clear to auscultation with no wheezes or rales. Good inspiratory effort. Abdomen: Soft nontender nondistended with normal bowel sounds x4 quadrants. Muskuloskeletal: No cyanosis or clubbing noted bilaterally Neuro: CN II-XII intact, strength, sensation,  reflexes Skin: Left lower extremity is erythematous, warm, edematous, and tender. Psychiatry: Judgement and insight appear normal. Mood is appropriate for condition and setting          Labs on Admission:  Basic Metabolic Panel: Recent Labs  Lab 08/05/23 0014  NA 134*  K 4.3  CL 100  CO2 24  GLUCOSE 111*  BUN 12  CREATININE 0.46  CALCIUM 9.3   Liver Function Tests: Recent Labs  Lab 08/05/23 0014  AST 31  ALT 26  ALKPHOS 54  BILITOT 1.1  PROT 6.3*  ALBUMIN 3.2*   No results for input(s): "LIPASE", "AMYLASE" in the last 168 hours. No results for input(s): "AMMONIA" in the last 168 hours. CBC: Recent Labs  Lab 08/05/23 0014  WBC 11.5*  NEUTROABS 6.4  HGB 13.3  HCT 39.6  MCV 95.2  PLT 281   Cardiac Enzymes: No results for input(s): "CKTOTAL", "CKMB", "CKMBINDEX", "TROPONINI" in the last 168 hours.  BNP (last 3 results) Recent Labs    06/03/23 1913 06/13/23 0250 08/05/23 0014  BNP 81.1 54.6 364.2*    ProBNP (last 3 results) No results for input(s): "PROBNP" in the last 8760 hours.  CBG: No results for input(s): "GLUCAP" in the last 168 hours.  Radiological Exams on Admission: DG Chest Port 1 View Result Date: 08/04/2023 CLINICAL DATA:  Possible sepsis EXAM: PORTABLE CHEST 1 VIEW COMPARISON:  06/13/2023 FINDINGS: Normal cardiac size. Aortic atherosclerosis. No acute airspace disease, pleural effusion or pneumothorax IMPRESSION: No active disease. Electronically Signed   By: Jasmine Pang M.D.   On: 08/04/2023 23:21   DG Foot Complete Right Result Date: 08/04/2023 CLINICAL DATA:  Pain and bruising EXAM: RIGHT FOOT COMPLETE - 3+ VIEW COMPARISON:  None Available. FINDINGS: Osteopenia limits the exam. Dorsal soft tissue swelling. Vascular calcifications. No definitive fracture or malalignment. IMPRESSION: No acute osseous abnormality. Electronically Signed   By: Jasmine Pang M.D.   On: 08/04/2023 23:20   DG Tibia/Fibula Left Result Date:  08/04/2023 CLINICAL DATA:  Pain and bruising EXAM: LEFT TIBIA AND FIBULA - 2 VIEW COMPARISON:  01/08/2023 FINDINGS: Diffuse soft tissue swelling. Vascular calcifications. No fracture or dislocation. Advanced knee arthritis with  postsurgical changes of the patella. IMPRESSION: Diffuse soft tissue swelling. No acute osseous abnormality. Advanced knee arthritis. Electronically Signed   By: Jasmine Pang M.D.   On: 08/04/2023 23:19    EKG: I independently viewed the EKG done and my findings are as followed: Sinus bradycardia rate of 52.  Nonspecific ST-T changes.  QTc 428.    Assessment/Plan Present on Admission:  Cellulitis of lower extremity  Principal Problem:   Cellulitis of lower extremity  Left lower extremity cellulitis, POA Received 1 dose of IV vancomycin in the ER Follow peripheral blood cultures x 2 Rocephin 2 g daily Monitor fever curve and WBCs Obtain MRSA screening test  Hypertension, BP is not at goal, elevated Resume home oral antihypertensives Monitor vital signs closely.  Dyspnea suspect exacerbation of HFpEF Acute on chronic HFpEF Last 2D echo done on 05/26/2022 revealed LVEF 65 to 70% with grade 1 diastolic dysfunction. BNP elevated greater than 300 with bilateral lower extremity pitting edema. IV Lasix 20 mg twice daily as blood pressure allows. Replete electrolytes as indicated. Start strict I's and O's and daily weight  Hypothyroidism Resume home levothyroxine  Hyperlipidemia Resume home Lipitor   Time: 75 minutes.   DVT prophylaxis: Subcu Lovenox daily.  Code Status: Full code.  Family Communication: Husband at bedside.  Disposition Plan: Admitted to telemetry medical unit.  Consults called: None.  Admission status: Inpatient status.   Status is: Inpatient The patient requires at least 2 midnights for further evaluation and treatment of present condition.   Darlin Drop MD [facility] Pager [phone]  If 7PM-7AM, please  contact night-coverage [email or web address] Password [email or web address]  08/05/2023, 2:11 AM

## 2023-08-06 ENCOUNTER — Inpatient Hospital Stay (HOSPITAL_COMMUNITY)

## 2023-08-06 DIAGNOSIS — R609 Edema, unspecified: Secondary | ICD-10-CM

## 2023-08-06 DIAGNOSIS — L03116 Cellulitis of left lower limb: Secondary | ICD-10-CM | POA: Diagnosis not present

## 2023-08-06 LAB — CBC
HCT: 38.6 % (ref 36.0–46.0)
Hemoglobin: 13 g/dL (ref 12.0–15.0)
MCH: 31.6 pg (ref 26.0–34.0)
MCHC: 33.7 g/dL (ref 30.0–36.0)
MCV: 93.9 fL (ref 80.0–100.0)
Platelets: 335 10*3/uL (ref 150–400)
RBC: 4.11 MIL/uL (ref 3.87–5.11)
RDW: 14.7 % (ref 11.5–15.5)
WBC: 10.4 10*3/uL (ref 4.0–10.5)
nRBC: 0 % (ref 0.0–0.2)

## 2023-08-06 LAB — BASIC METABOLIC PANEL
Anion gap: 12 (ref 5–15)
BUN: 13 mg/dL (ref 8–23)
CO2: 25 mmol/L (ref 22–32)
Calcium: 9.1 mg/dL (ref 8.9–10.3)
Chloride: 96 mmol/L — ABNORMAL LOW (ref 98–111)
Creatinine, Ser: 0.45 mg/dL (ref 0.44–1.00)
GFR, Estimated: >60 mL/min (ref >60)
Glucose, Bld: 115 mg/dL — ABNORMAL HIGH (ref 70–99)
Potassium: 4.2 mmol/L (ref 3.5–5.1)
Sodium: 133 mmol/L — ABNORMAL LOW (ref 135–145)

## 2023-08-06 LAB — C-REACTIVE PROTEIN: CRP: <0.5 mg/dL (ref <1.0)

## 2023-08-06 LAB — MAGNESIUM: Magnesium: 1.3 mg/dL — ABNORMAL LOW (ref 1.7–2.4)

## 2023-08-06 MED ORDER — MAGNESIUM SULFATE 4 GM/100ML IV SOLN
4.0000 g | Freq: Once | INTRAVENOUS | Status: AC
Start: 2023-08-06 — End: 2023-08-07
  Administered 2023-08-06: 4 g via INTRAVENOUS
  Filled 2023-08-06 (×2): qty 100

## 2023-08-06 MED ORDER — VANCOMYCIN HCL IN DEXTROSE 1-5 GM/200ML-% IV SOLN
1000.0000 mg | INTRAVENOUS | Status: DC
  Administered 2023-08-06: 1000 mg via INTRAVENOUS
  Filled 2023-08-06: qty 200

## 2023-08-06 MED ORDER — DICLOFENAC SODIUM 1 % EX GEL
2.0000 g | Freq: Four times a day (QID) | CUTANEOUS | Status: DC
  Administered 2023-08-06 – 2023-08-08 (×5): 2 g via TOPICAL
  Filled 2023-08-06: qty 100

## 2023-08-06 NOTE — Progress Notes (Signed)
 [facility] Progress Note Patient: [name] RUE:[id number] DOB: 1945-12-09 DOA: 08/04/2023  DOS: the patient was seen and examined on 08/06/2023  Brief Hospital Course: PMH of HLD, type II DM, hypothyroidism, postpolio syndrome with chronic paraplegia and bedridden, neurogenic bladder, presented to hospital with complaints of shortness of breath as well as left leg redness. Currently being treated for cellulitis and possible acute on chronic HFpEF.   Assessment and Plan: Recurrent left leg cellulitis. Patient has history of chronic venous dermatitis. Suspect the presentation is secondary to volume overload leading to skin injury leading to cellulitis. Currently on oral cefadroxil.  CRP is not elevated.   Acute on chronic HFpEF. Echocardiogram shows EF of 65 to 70%.  Last echo was in January 2024. Currently receiving IV Lasix. Will monitor output.  Swelling of the leg is improving.   HTN. Blood pressure mildly elevated.   Continue home regimen.   Sinus bradycardia. Will continue to hold beta-blockers. Monitor on telemetry.  Concern for A-fib.  Ruled out. Telemetry does not show any evidence of A-fib.  EKG shows PAC.   HLD. Continue statin.   Hypothyroidism.   On Synthroid.  Check TSH and free T4 as could be related to volume overload.   Chronic pain. Bipolar disorder. Neurogenic bladder. On amitriptyline, clonazepam, Namenda, gabapentin.  Zanaflex. Continue the same. Currently on tramadol although family reports that the patient actually has some history of confusion while on that medication.  Monitor closely.  GERD. Getting PPI.   Chronic leg swelling. Lower extremity Doppler negative for any DVT.   Right leg injury. X-ray unremarkable.  Monitor.  Hypomagnesemia. Replacing.   Subjective: No nausea no vomiting.  Ongoing pain in her knee.  No fever no chills.  Physical Exam: General: in Mild distress, No Rash, unchanged redness of the left  leg. Cardiovascular: S1 and S2 Present, No Murmur Respiratory: Good respiratory effort, Bilateral Air entry present. No Crackles, No wheezes Abdomen: Bowel Sound present, No tenderness Extremities: Improving with persistent edema Neuro: Alert and oriented x3, no new focal deficit  Data Reviewed: I have Reviewed nursing notes, Vitals, and Lab results. Since last encounter, pertinent lab results CBC and BMP   . I have ordered test including CBC and BMP  .   Disposition: Status is: Inpatient Remains inpatient appropriate because: Monitor for renal function and diuresis needs  Place and maintain sequential compression device Start: 08/05/23 1333 Place TED hose Start: 08/05/23 1332   Family Communication: Family at bedside Level of care: Telemetry Medical   Vitals:   08/06/23 0421 08/06/23 0500 08/06/23 0835 08/06/23 1506  BP: (!) 133/58  127/61 (!) 157/73  Pulse: (!) 46  (!) 47 (!) 52  Resp: 18  18 20   Temp: (!) 97 F (36.1 C)  97.6 F (36.4 C) 97.6 F (36.4 C)  TempSrc: Oral  Oral Oral  SpO2: 95%  93% 96%  Weight:  96.3 kg    Height:         Author: Lynden Oxford, MD 08/06/2023 6:26 PM  Please look on [email or web address] to find out who is on call.

## 2023-08-06 NOTE — Hospital Course (Signed)
 PMH of HLD, type II DM, hypothyroidism, postpolio syndrome with chronic paraplegia and bedridden, neurogenic bladder, presented to hospital with complaints of shortness of breath as well as left leg redness. Currently being treated for cellulitis and possible acute on chronic HFpEF.  Assessment and Plan: Recurrent left leg cellulitis. Patient has history of chronic venous dermatitis. Suspect the presentation is secondary to volume overload leading to skin injury leading to cellulitis. Currently on oral cefadroxil.  CRP is not elevated.   Acute on chronic HFpEF. Echocardiogram shows EF of 65 to 70%.  Last echo was in January 2024. Currently receiving IV Lasix. Swelling is improving. Switch to oral torsemide, home doses 5 mg would recommend to increase it to 10 mg.   HTN. Blood pressure was elevated.  Now normal.  Continue current regimen.  Carvedilol is discontinued.  Telmisartan is on hold.   Sinus bradycardia. Etiology of the sinus bradycardia is not clear. Patient has been on Coreg for a while 25 mg twice a day.  Currently we will continue to hold this medication. Monitor on telemetry.  Concern for A-fib.  Ruled out. telemetry does not show any evidence of A-fib.  EKG shows PAC.   HLD. Continue statin.   Hypothyroidism.   On Synthroid.  Check TSH and free T4   Chronic pain. Bipolar disorder. Neurogenic bladder. On amitriptyline, clonazepam, Namenda, gabapentin.  Zanaflex. Continue the same. Currently on tramadol although family reports that the patient actually has some history of confusion while on that medication.  Monitor closely.  GERD. Getting PPI.   Chronic leg swelling. Lower extremity Doppler negative for any DVT.   Right leg injury. X-ray unremarkable.  Monitor.  Hypomagnesemia. Replacing.

## 2023-08-06 NOTE — Progress Notes (Signed)
 Heart Failure Navigator Progress Note  Assessed for Heart & Vascular TOC clinic readiness.  Patient does not meet criteria due to per MD note patient mainly wheel chair and bed bound. .   Navigator will sign off at this time.   Rhae Hammock, BSN, Scientist, clinical (histocompatibility and immunogenetics) Only

## 2023-08-07 DIAGNOSIS — L03116 Cellulitis of left lower limb: Secondary | ICD-10-CM | POA: Diagnosis not present

## 2023-08-07 LAB — BASIC METABOLIC PANEL
Anion gap: 10 (ref 5–15)
Anion gap: 13 (ref 5–15)
BUN: 12 mg/dL (ref 8–23)
BUN: 13 mg/dL (ref 8–23)
CO2: 27 mmol/L (ref 22–32)
CO2: 26 mmol/L (ref 22–32)
Calcium: 8.7 mg/dL — ABNORMAL LOW (ref 8.9–10.3)
Calcium: 9.1 mg/dL (ref 8.9–10.3)
Chloride: 90 mmol/L — ABNORMAL LOW (ref 98–111)
Chloride: 90 mmol/L — ABNORMAL LOW (ref 98–111)
Creatinine, Ser: 0.52 mg/dL (ref 0.44–1.00)
Creatinine, Ser: 0.8 mg/dL (ref 0.44–1.00)
GFR, Estimated: >60 mL/min (ref >60)
GFR, Estimated: >60 mL/min (ref >60)
Glucose, Bld: 139 mg/dL — ABNORMAL HIGH (ref 70–99)
Glucose, Bld: 129 mg/dL — ABNORMAL HIGH (ref 70–99)
Potassium: 4 mmol/L (ref 3.5–5.1)
Potassium: 3.9 mmol/L (ref 3.5–5.1)
Sodium: 127 mmol/L — ABNORMAL LOW (ref 135–145)
Sodium: 129 mmol/L — ABNORMAL LOW (ref 135–145)

## 2023-08-07 LAB — T4, FREE: Free T4: 1.47 ng/dL — ABNORMAL HIGH (ref 0.61–1.12)

## 2023-08-07 LAB — TSH: TSH: 3.367 u[IU]/mL (ref 0.350–4.500)

## 2023-08-07 LAB — MAGNESIUM: Magnesium: 1.7 mg/dL (ref 1.7–2.4)

## 2023-08-07 MED ORDER — FUROSEMIDE 10 MG/ML IJ SOLN: 40.0000 mg | Freq: Two times a day (BID) | INTRAMUSCULAR | Status: DC

## 2023-08-07 MED ORDER — FUROSEMIDE 10 MG/ML IJ SOLN
40.0000 mg | Freq: Once | INTRAMUSCULAR | Status: AC
  Administered 2023-08-07: 40 mg via INTRAVENOUS
  Filled 2023-08-07: qty 4

## 2023-08-07 MED ORDER — TORSEMIDE 20 MG PO TABS
10.0000 mg | ORAL_TABLET | Freq: Every day | ORAL | Status: DC
  Administered 2023-08-08: 10 mg via ORAL
  Filled 2023-08-07: qty 1

## 2023-08-07 MED ORDER — ACETAMINOPHEN 325 MG PO TABS: 650.0000 mg | ORAL_TABLET | Freq: Four times a day (QID) | ORAL | Status: DC | PRN

## 2023-08-07 MED ORDER — GABAPENTIN 100 MG PO CAPS
100.0000 mg | ORAL_CAPSULE | Freq: Two times a day (BID) | ORAL | Status: DC
  Administered 2023-08-07 – 2023-08-08 (×3): 100 mg via ORAL
  Filled 2023-08-07 (×3): qty 1

## 2023-08-07 MED ORDER — FUROSEMIDE 20 MG PO TABS: 20.0000 mg | ORAL_TABLET | Freq: Every day | ORAL | Status: DC

## 2023-08-07 NOTE — Progress Notes (Addendum)
 [facility] Progress Note Patient: [name] UJW:[id number] DOB: 1946/04/26 DOA: 08/04/2023  DOS: the patient was seen and examined on 08/07/2023  Brief Hospital Course: PMH of HLD, type II DM, hypothyroidism, postpolio syndrome with chronic paraplegia and bedridden, neurogenic bladder, presented to hospital with complaints of shortness of breath as well as left leg redness. Currently being treated for cellulitis and possible acute on chronic HFpEF.  Assessment and Plan: Recurrent left leg cellulitis. Patient has history of chronic venous dermatitis. Suspect the presentation is secondary to volume overload leading to skin injury leading to cellulitis. Currently on oral cefadroxil.  CRP is not elevated.   Acute on chronic HFpEF. Echocardiogram shows EF of 65 to 70%.  Last echo was in January 2024. Currently receiving IV Lasix. Swelling is improving. Switch to oral torsemide, home doses 5 mg would recommend to increase it to 10 mg.   HTN. Blood pressure was elevated.  Now normal.  Continue current regimen.  Carvedilol is discontinued.  Telmisartan is on hold.   Sinus bradycardia. Etiology of the sinus bradycardia is not clear. Patient has been on Coreg for a while 25 mg twice a day.  Currently we will continue to hold this medication. Monitor on telemetry.  Concern for A-fib.  Ruled out. telemetry does not show any evidence of A-fib.  EKG shows PAC.   HLD. Continue statin.   Hypothyroidism.   On Synthroid.  Check TSH and free T4   Chronic pain. Bipolar disorder. Neurogenic bladder. On amitriptyline, clonazepam, Namenda, gabapentin.  Zanaflex. Continue the same. Currently on tramadol although family reports that the patient actually has some history of confusion while on that medication.  Monitor closely.  GERD. Getting PPI.   Chronic leg swelling. Lower extremity Doppler negative for any DVT.   Right leg injury. X-ray unremarkable.   Monitor.  Hypomagnesemia. Replacing.   Morbid obesity Body mass index is 40.04 kg/m.  Placing the patient at high risk for poor outcome.  Pressure ulcer, bilateral buttocks stage II, right leg,. Present on admission. Continue foam dressing.  Subjective: No nausea no vomiting no fever no chills.  Pain bilateral knee unchanged.  Shortness of breath back to normal.  Swelling in the leg improving.  Physical Exam: General: in Mild distress, No Rash Cardiovascular: S1 and S2 Present, No Murmur Respiratory: Good respiratory effort, Bilateral Air entry present. No Crackles, No wheezes Abdomen: Bowel Sound present, No tenderness Extremities: Improving bilateral pedal edema Neuro: Alert and oriented x3, no new focal deficit  Data Reviewed: I have Reviewed nursing notes, Vitals, and Lab results. Since last encounter, pertinent lab results CBC and BMP   . I have ordered test including CBC and BMP  .   Disposition: Status is: Inpatient Remains inpatient appropriate because: Receiving IV diuresis  Place and maintain sequential compression device Start: 08/05/23 1333 Place TED hose Start: 08/05/23 1332   Family Communication: Family at bedside.  Had multiple questions.  All answered. Level of care: Telemetry Medical   Vitals:   08/07/23 0450 08/07/23 0500 08/07/23 0748 08/07/23 1818  BP: (!) 106/56  (!) 123/56 (!) 145/70  Pulse: (!) 56  (!) 56 (!) 49  Resp:   17 17  Temp: 98.1 F (36.7 C)   98.5 F (36.9 C)  TempSrc: Axillary     SpO2: 94%  97% 97%  Weight:  93 kg    Height:         Author: Lynden Oxford, MD 08/07/2023 6:56 PM  Please look on [email or web address]  to find out who is on call.

## 2023-08-07 NOTE — Plan of Care (Signed)

## 2023-08-08 ENCOUNTER — Other Ambulatory Visit (HOSPITAL_COMMUNITY): Payer: Self-pay

## 2023-08-08 DIAGNOSIS — L8990 Pressure ulcer of unspecified site, unspecified stage: Secondary | ICD-10-CM | POA: Insufficient documentation

## 2023-08-08 DIAGNOSIS — E119 Type 2 diabetes mellitus without complications: Secondary | ICD-10-CM | POA: Diagnosis not present

## 2023-08-08 DIAGNOSIS — I5033 Acute on chronic diastolic (congestive) heart failure: Secondary | ICD-10-CM

## 2023-08-08 DIAGNOSIS — L03116 Cellulitis of left lower limb: Secondary | ICD-10-CM | POA: Diagnosis not present

## 2023-08-08 LAB — BASIC METABOLIC PANEL
Anion gap: 10 (ref 5–15)
BUN: 19 mg/dL (ref 8–23)
CO2: 26 mmol/L (ref 22–32)
Calcium: 8.7 mg/dL — ABNORMAL LOW (ref 8.9–10.3)
Chloride: 91 mmol/L — ABNORMAL LOW (ref 98–111)
Creatinine, Ser: 0.75 mg/dL (ref 0.44–1.00)
GFR, Estimated: >60 mL/min (ref >60)
Glucose, Bld: 138 mg/dL — ABNORMAL HIGH (ref 70–99)
Potassium: 4.1 mmol/L (ref 3.5–5.1)
Sodium: 127 mmol/L — ABNORMAL LOW (ref 135–145)

## 2023-08-08 LAB — CBC
HCT: 37.8 % (ref 36.0–46.0)
Hemoglobin: 13 g/dL (ref 12.0–15.0)
MCH: 31.6 pg (ref 26.0–34.0)
MCHC: 34.4 g/dL (ref 30.0–36.0)
MCV: 92 fL (ref 80.0–100.0)
Platelets: 334 10*3/uL (ref 150–400)
RBC: 4.11 MIL/uL (ref 3.87–5.11)
RDW: 14.6 % (ref 11.5–15.5)
WBC: 10.9 10*3/uL — ABNORMAL HIGH (ref 4.0–10.5)
nRBC: 0 % (ref 0.0–0.2)

## 2023-08-08 LAB — MAGNESIUM: Magnesium: 1.8 mg/dL (ref 1.7–2.4)

## 2023-08-08 MED ORDER — CEFADROXIL 500 MG PO CAPS
1000.0000 mg | ORAL_CAPSULE | Freq: Two times a day (BID) | ORAL | 0 refills | Status: AC
Start: 2023-08-08 — End: 2023-08-12
  Filled 2023-08-08: qty 16, 4d supply, fill #0

## 2023-08-08 NOTE — Evaluation (Addendum)
 Physical Therapy Evaluation Patient Details Name: [name] MRN: [id number] DOB: April 30, 1946 Today's Date: 08/08/2023  History of Present Illness  78 y/o F presents [facility] on 3/23 with CC of SOB and left leg wound with pain, A&P suggest recurrent left leg cellulitis. 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  Clinical Impression  Patient lives in a house with her husband and needs assistance for ADLs/IADLs. Patient uses a hoyer lift at baseline and has a helper who assists with ADLs/IADLs 6hrs/day, 5days/week. Patient presents with some cognition deficits (not oriented to location), chronic paraplegia and reports LLE pain with passive movements. Patient is Min-ModA for bed mobility and total A for transfers via hoyer lift to chair. Patient would benefit from HHPT to maximize her functional independence. Acute PT will continue to follow up. Thank you for this consult.     If plan is discharge home, recommend the following: Two people to help with walking and/or transfers;A lot of help with bathing/dressing/bathroom;Assistance with cooking/housework;Assist for transportation;Help with stairs or ramp for entrance   Can travel by private vehicle        Equipment Recommendations None recommended by PT  Recommendations for Other Services  OT consult    Functional Status Assessment Patient has had a recent decline in their functional status and/or demonstrates limited ability to make significant improvements in function in a reasonable and predictable amount of time     Precautions / Restrictions Precautions Precautions: Fall Recall of Precautions/Restrictions: Intact Restrictions Weight Bearing Restrictions Per Provider Order: No      Mobility  Bed Mobility Overal bed mobility: Needs Assistance Bed Mobility: Rolling (Supine to Long Sit) Rolling:  Min assist, Mod assist, Used rails         General bed mobility comments: Min-ModA for rolling, patient uses HOB rails to assist. Min-ModA for supine > longsitting, able to pull herself up using rails    Transfers Overall transfer level: Needs assistance   Transfers: Bed to chair/wheelchair/BSC             General transfer comment: Uses Nurse, adult for transfers at baseline Transfer via Lift Equipment: Restaurant manager, fast food     Tilt Bed    Modified Rankin (Stroke Patients Only)       Balance                                             Pertinent Vitals/Pain Pain Assessment Pain Assessment: No/denies pain    Home Living Family/patient expects to be discharged to:: Private residence Living Arrangements: Spouse/significant other Available Help at Discharge: Family Type of Home: House Home Access: Ramped entrance       Home Layout: One level Home Equipment: Wheelchair - power;Shower seat;BSC/3in1;Lift chair;Grab bars - tub/shower;Grab bars - toilet;Hand held shower head      Prior Function Prior Level of Function : Needs assist       Physical Assist : Mobility (physical);ADLs (physical) Mobility (physical): Bed mobility;Transfers ADLs (physical): Bathing;Dressing;Toileting;IADLs Mobility Comments: Uses Lift for transfers ADLs Comments: Spouse reports that he has a guy who helps out 6hrs a  day 5 days a week with ADLs     Extremity/Trunk Assessment   Upper Extremity Assessment Upper Extremity Assessment: Defer to OT evaluation    Lower Extremity Assessment Lower Extremity Assessment: RLE deficits/detail;LLE deficits/detail RLE Deficits / Details: Flaccid. Excessive ER LLE Deficits / Details: TTP, painful PROM in all directions       Communication   Communication Communication: No apparent difficulties    Cognition Arousal: Alert Behavior During  Therapy: WFL for tasks assessed/performed   PT - Cognitive impairments: Orientation   Orientation impairments: Place                     Following commands: Intact       Cueing Cueing Techniques: Verbal cues     General Comments      Exercises     Assessment/Plan    PT Assessment Patient needs continued PT services  PT Problem List Decreased strength;Decreased range of motion;Decreased activity tolerance;Decreased balance;Decreased mobility;Decreased coordination;Impaired sensation;Impaired tone       PT Treatment Interventions Therapeutic activities;Therapeutic exercise;Neuromuscular re-education;Wheelchair mobility training;Functional mobility training    PT Goals (Current goals can be found in the Care Plan section)  Acute Rehab PT Goals Patient Stated Goal: Wants to be able to walk again PT Goal Formulation: With patient Time For Goal Achievement: 08/22/23 Potential to Achieve Goals: Fair    Frequency Min 1X/week     Co-evaluation               AM-PAC PT "6 Clicks" Mobility  Outcome Measure Help needed turning from your back to your side while in a flat bed without using bedrails?: A Lot Help needed moving from lying on your back to sitting on the side of a flat bed without using bedrails?: Total Help needed moving to and from a bed to a chair (including a wheelchair)?: Total Help needed standing up from a chair using your arms (e.g., wheelchair or bedside chair)?: Total Help needed to walk in hospital room?: Total Help needed climbing 3-5 steps with a railing? : Total 6 Click Score: 7    End of Session   Activity Tolerance: Patient tolerated treatment well Patient left: in chair;with call bell/phone within reach;with chair alarm set Nurse Communication: Need for lift equipment;Mobility status PT Visit Diagnosis: Muscle weakness (generalized) (M62.81);Difficulty in walking, not elsewhere classified (R26.2);Other symptoms and signs involving  the nervous system (R29.898)    Time: 8469-6295 PT Time Calculation (min) (ACUTE ONLY): 38 min   Charges:   PT Evaluation $PT Eval Low Complexity: 1 Low PT Treatments $Therapeutic Activity: 23-37 mins PT General Charges $$ ACUTE PT VISIT: 1 Visit        Doreen Beam, SPT   Triton Heidrich 08/08/2023, 10:51 AM

## 2023-08-08 NOTE — Plan of Care (Signed)
  Problem: Acute Rehab PT Goals(only PT should resolve) Goal: Pt will Roll Supine to Side Flowsheets (Taken 08/08/2023 1025) Pt will Roll Supine to Side: with contact guard assist Goal: Pt Will Go Supine/Side To Sit Flowsheets (Taken 08/08/2023 1025) Pt will go Supine/Side to Sit: with minimal assist Goal: Patient Will Perform Sitting Balance Flowsheets (Taken 08/08/2023 1025) Patient will perform sitting balance:  1-2 min  with bilateral UE support  with minimal assist

## 2023-08-08 NOTE — TOC Transition Note (Addendum)
 Transition of Care [facility]) - Discharge Note   Patient Details  Name: [name] MRN: [id number] Date of Birth: 1945/08/08  Transition of [facility]) CM/SW Contact:  Epifanio Lesches, RN Phone Number: 08/08/2023, 11:02 AM   Clinical Narrative:    Patient will DC to: home Anticipated DC date: husband Family notified: yes Transport by: car       - Cellulitis of lower extremity  Per MD patient ready for DC today. RN, patient, and patient's husband notified of DC. Orders noted for home heath services. Pt agreeable. Pt without provider preference. Referral made with [facility] and accepted. Pt without DME needs. Post hospital f/u noted on AVS. RX med sent to [facility] pharmacy for pt pickup.  11:30 am Call received from Jodie Echevaria [facility] and informed NCM pt active with them. [facility] made aware and noted on AVS  RNCM will sign off for now as intervention is no longer needed. Please consult [place] again if new needs arise.   Final next level of care: Home w Home Health Services Barriers to Discharge: No Barriers Identified   Patient Goals and CMS Choice     Choice offered to / list presented to : Patient      Discharge Placement                       Discharge Plan and Services Additional resources added to the After Visit Summary for                            [facility] Arranged: PT, OT [facility] Agency: [facility] Date [facility] Agency Contacted: 08/08/23 Time HH Agency Contacted: 1058 Representative spoke with at [facility] Agency: Amy  Social Drivers of Health (SDOH) Interventions SDOH Screenings   Food Insecurity: No Food Insecurity (08/05/2023)  Housing: Low Risk  (08/05/2023)  Transportation Needs: No Transportation Needs (08/05/2023)  Utilities: Not At Risk (08/05/2023)  Depression (PHQ2-9): Low Risk  (05/30/2021)  Social Connections: Socially Integrated (08/05/2023)  Tobacco Use: Low Risk  (08/04/2023)     Readmission Risk Interventions    01/11/2023   12:56 PM  10/26/2022   12:31 PM  Readmission Risk Prevention Plan  Transportation Screening Complete Complete  PCP or Specialist Appt within 5-7 Days  Complete  PCP or Specialist Appt within 3-5 Days Complete   Home Care Screening  Complete  Medication Review (RN CM)  Complete  HRI or Home Care Consult Complete   Social Work Consult for Recovery Care Planning/Counseling Complete   Palliative Care Screening Not Applicable   Medication Review Oceanographer) Complete

## 2023-08-08 NOTE — Care Management Important Message (Signed)
 Important Message  Patient Details  Name: [name] MRN: [id number] Date of Birth: 08-29-1945   Important Message Given:  Yes - Medicare IM     Sherilyn Banker 08/08/2023, 2:34 PM

## 2023-08-08 NOTE — Discharge Summary (Signed)
 Physician Discharge Summary  BATUL DIEGO WUJ:[id number] DOB: 1945/11/25 DOA: 08/04/2023  PCP: Charlane Ferretti, DO  Admit date: 08/04/2023 Discharge date: 08/08/2023  Admitted From: Home Disposition:  home  Recommendations for Outpatient Follow-up:  Follow up with PCP in 1-2 weeks Please obtain BMP/CBC in one week Resume Coreg as an outpatient if blood pressure and heart rate can tolerated  Home Health:No Equipment/Devices:None  Discharge Condition:Stable CODE STATUS:Full Diet recommendation: Heart Healthy  Brief/Interim Summary: 78 y.o. female past medical history of diabetes mellitus type 2 hypothyroidism postpolio syndrome with paraplegia.  Written, neurogenic bladder comes into the hospital for shortness of breath and left leg redness currently being treated for cellulitis and possible acute on chronic diastolic heart failure   Discharge Diagnoses:  Principal Problem:   Cellulitis of lower extremity Active Problems:   Controlled type 2 diabetes mellitus without complication, without long-term current use of insulin (HCC)   Essential hypertension   Post-polio syndrome   Obesity (BMI 30-39.9)   ATRIAL FIBRILLATION   Pressure injury of skin  Recurrent left lower extremity cellulitis: He has been his chronic changes. He was placed on IV Rocephin now transition to oral antibiotics to complete a 7-day course per  Acute on chronic diastolic dysfunction: With last 2D echo showed a preserved EF. She was started on IV Lasix now transition to oral torsemide she is negative about 2 L. Continue torsemide and potassium supplement as an outpatient.  Essential hypertension: ARB and Coreg were held. She was continue torsemide. She will resume her ARB as an outpatient. She will follow-up with PCP as an outpatient restart her Coreg if her heart rate can tolerated.  Sinus bradycardia: Coreg has been held she is still bradycardic follow-up with PCP as an  outpatient.  Hyperlipidemia: Continue statins.  Hypothyroidism: Continue Synthroid.  Controlled diabetes mellitus type 2 without complication without long-term insulin: Her blood glucose was well-controlled and how she required no insulins. She will resume metformin as an outpatient.  Chronic pain/bipolar disorder/neurogenic bladder: Continue Elavil, clonazepam, Namenda, gabapentin and Zanaflex. No changes made to her medication.   Chronic lower extremity swelling: Lower extremity Doppler was negative for DVT.   Hypomagnesemia: Repleted.   Morbid obesity: Noted.  Bilateral pressure ulcer stage II present on admission:  No  Discharge Instructions  Discharge Instructions     Diet - low sodium heart healthy   Complete by: As directed    Discharge wound care:   Complete by: As directed    Per wound care instructions   Increase activity slowly   Complete by: As directed       Allergies as of 08/08/2023       Reactions   Tamsulosin Other (See Comments)   Caused uncontrollable movements   Ciprofloxacin Rash   Carisoprodol Itching   Other Reaction(s): Unknown   Oxycodone-acetaminophen    Other Reaction(s): ITCHING   Penicillins Itching   Pt has tolerated rocephin IV Other Reaction(s): ITCHING   Sulfa Antibiotics Other (See Comments)   Unknown.  States as always told had an allergy Other Reaction(s): Unknown   Aspirin Itching, Other (See Comments)   Chest pain, also   Macrodantin [nitrofurantoin Macrocrystal] Rash   Nsaids Other (See Comments)   Chest pain and stomach pain Other Reaction(s): Unknown Non-steroidal anti-inflammatory agent (product)   Percocet [oxycodone-acetaminophen] Itching   Propoxyphene Other (See Comments)   Chest/stomach pain Other Reaction(s): ITCHING   Propoxyphene N-acetaminophen Other (See Comments)   Chest/stomach pain  Medication List     STOP taking these medications    carvedilol 25 MG tablet Commonly known as:  COREG   fluticasone 50 MCG/ACT nasal spray Commonly known as: FLONASE       TAKE these medications    aspirin EC 81 MG tablet Take 81 mg by mouth every evening. Swallow whole.   atorvastatin 40 MG tablet Commonly known as: LIPITOR Take 40 mg by mouth at bedtime.   Calcium 600+D 600-20 MG-MCG Tabs Generic drug: Calcium Carb-Cholecalciferol Take 1 tablet by mouth 2 (two) times daily.   cefadroxil 500 MG capsule Commonly known as: DURICEF Take 2 capsules (1,000 mg total) by mouth 2 (two) times daily for 4 days.   cholecalciferol 25 MCG (1000 UNIT) tablet Commonly known as: VITAMIN D3 Take 1,000 Units by mouth daily.   clonazePAM 0.5 MG tablet Commonly known as: KLONOPIN Take 0.5 mg by mouth 2 (two) times daily as needed for anxiety.   FeroSul 325 (65 Fe) MG tablet Generic drug: ferrous sulfate Take 325 mg by mouth daily.   gabapentin 100 MG capsule Commonly known as: NEURONTIN Take 100 mg by mouth 2 (two) times daily.   levothyroxine 100 MCG tablet Commonly known as: SYNTHROID Take 100 mcg by mouth daily before breakfast.   loratadine 10 MG tablet Commonly known as: CLARITIN Take 10 mg by mouth daily as needed for allergies.   memantine 5 MG tablet Commonly known as: Namenda Take 1 tablet (5 mg total) by mouth 2 (two) times daily. What changed: when to take this   metFORMIN 500 MG 24 hr tablet Commonly known as: GLUCOPHAGE-XR Take 500 mg by mouth every evening.   mirabegron ER 50 MG Tb24 tablet Commonly known as: MYRBETRIQ Take 50 mg by mouth daily.   multivitamin with minerals tablet Take 1 tablet by mouth daily.   pantoprazole 40 MG tablet Commonly known as: PROTONIX Take 1 tablet by mouth daily. Take 1 tab daily   telmisartan 80 MG tablet Commonly known as: MICARDIS Take 80 mg by mouth daily at 12 noon.   tiZANidine 4 MG tablet Commonly known as: ZANAFLEX Take 4 mg by mouth at bedtime. PAIN   torsemide 5 MG tablet Commonly known as:  DEMADEX Take 5 mg by mouth daily.   VITAMIN B COMPLEX W/B-12 PO Take 1 tablet by mouth daily.   vitamin E 180 MG (400 UNITS) capsule Take 400 Units by mouth daily.   zolpidem 5 MG tablet Commonly known as: AMBIEN Take 5 mg by mouth at bedtime as needed for sleep.               Discharge Care Instructions  (From admission, onward)           Start     Ordered   08/08/23 0000  Discharge wound care:       Comments: Per wound care instructions   08/08/23 0940            Follow-up Information     Charlane Ferretti, DO Follow up.   Specialty: Internal Medicine Contact information: [address] [place] [address] [phone]                Allergies  Allergen Reactions   Tamsulosin Other (See Comments)    Caused uncontrollable movements   Ciprofloxacin Rash   Carisoprodol Itching    Other Reaction(s): Unknown   Oxycodone-Acetaminophen     Other Reaction(s): ITCHING   Penicillins Itching    Pt has tolerated rocephin  IV  Other Reaction(s): ITCHING   Sulfa Antibiotics Other (See Comments)    Unknown.  States as always told had an allergy  Other Reaction(s): Unknown   Aspirin Itching and Other (See Comments)    Chest pain, also   Macrodantin [Nitrofurantoin Macrocrystal] Rash   Nsaids Other (See Comments)    Chest pain and stomach pain  Other Reaction(s): Unknown  Non-steroidal anti-inflammatory agent (product)   Percocet [Oxycodone-Acetaminophen] Itching   Propoxyphene Other (See Comments)    Chest/stomach pain  Other Reaction(s): ITCHING   Propoxyphene N-Acetaminophen Other (See Comments)    Chest/stomach pain    Consultations: None   Procedures/Studies: VAS [place] LOWER EXTREMITY VENOUS (DVT) Result Date: 08/06/2023  Lower Venous DVT Study Patient Name:  TIFFAY PINETTE  Date of Exam:   08/06/2023 Medical Rec #: [id number]      Accession #:    [id number] Date of Birth: 1946-04-23      Patient Gender: F Patient Age:   78 years Exam  Location:  [facility] Procedure:      VAS [place] LOWER EXTREMITY VENOUS (DVT) Referring Phys: PRANAV PATEL --------------------------------------------------------------------------------  Indications: Edema, Erythema, SOB, Pain, and CHF.  Limitations: Body habitus and poor ultrasound/tissue interface. Comparison Study: Previous study of the right lower extremity on 6.14.2024. Performing Technologist: Fernande Bras  Examination Guidelines: A complete evaluation includes B-mode imaging, spectral Doppler, color Doppler, and power Doppler as needed of all accessible portions of each vessel. Bilateral testing is considered an integral part of a complete examination. Limited examinations for reoccurring indications may be performed as noted. The reflux portion of the exam is performed with the patient in reverse Trendelenburg.  +---------+---------------+---------+-----------+----------+-------------------+ RIGHT    CompressibilityPhasicitySpontaneityPropertiesThrombus Aging      +---------+---------------+---------+-----------+----------+-------------------+ CFV      Full           Yes      Yes                                      +---------+---------------+---------+-----------+----------+-------------------+ SFJ      Full           Yes      Yes                                      +---------+---------------+---------+-----------+----------+-------------------+ FV Prox  Full                                                             +---------+---------------+---------+-----------+----------+-------------------+ FV Mid   Full                                                             +---------+---------------+---------+-----------+----------+-------------------+ FV DistalFull           Yes      Yes                                      +---------+---------------+---------+-----------+----------+-------------------+  PFV      Full                                                              +---------+---------------+---------+-----------+----------+-------------------+ POP      Full           Yes      Yes                                      +---------+---------------+---------+-----------+----------+-------------------+ PTV                                                   Not well visualized +---------+---------------+---------+-----------+----------+-------------------+ PERO                                                  Not well visualized +---------+---------------+---------+-----------+----------+-------------------+ Limited study due to patient's body habitus and calcification in adjacent arterial vessels.  +---------+---------------+---------+-----------+----------+-------------------+ LEFT     CompressibilityPhasicitySpontaneityPropertiesThrombus Aging      +---------+---------------+---------+-----------+----------+-------------------+ CFV      Full           Yes      Yes                                      +---------+---------------+---------+-----------+----------+-------------------+ SFJ      Full           Yes      Yes                                      +---------+---------------+---------+-----------+----------+-------------------+ FV Prox  Full                                                             +---------+---------------+---------+-----------+----------+-------------------+ FV Mid   Full                                                             +---------+---------------+---------+-----------+----------+-------------------+ FV DistalFull           Yes      Yes                                      +---------+---------------+---------+-----------+----------+-------------------+ PFV      Full                                                             +---------+---------------+---------+-----------+----------+-------------------+  POP      Full           Yes      Yes                                       +---------+---------------+---------+-----------+----------+-------------------+ PTV                                                   Not well visualized +---------+---------------+---------+-----------+----------+-------------------+ PERO                                                  Not well visualized +---------+---------------+---------+-----------+----------+-------------------+ Limited study due to patient's body habitus and calcification in adjacent arterial vessels.   Summary: RIGHT: - There is no evidence of deep vein thrombosis in the lower extremity. However, portions of this examination were limited- see technologist comments above.  LEFT: - There is no evidence of deep vein thrombosis in the lower extremity. However, portions of this examination were limited- see technologist comments above.  *See table(s) above for measurements and observations.    Preliminary    DG Chest Port 1 View Result Date: 08/04/2023 CLINICAL DATA:  Possible sepsis EXAM: PORTABLE CHEST 1 VIEW COMPARISON:  06/13/2023 FINDINGS: Normal cardiac size. Aortic atherosclerosis. No acute airspace disease, pleural effusion or pneumothorax IMPRESSION: No active disease. Electronically Signed   By: Jasmine Pang M.D.   On: 08/04/2023 23:21   DG Foot Complete Right Result Date: 08/04/2023 CLINICAL DATA:  Pain and bruising EXAM: RIGHT FOOT COMPLETE - 3+ VIEW COMPARISON:  None Available. FINDINGS: Osteopenia limits the exam. Dorsal soft tissue swelling. Vascular calcifications. No definitive fracture or malalignment. IMPRESSION: No acute osseous abnormality. Electronically Signed   By: Jasmine Pang M.D.   On: 08/04/2023 23:20   DG Tibia/Fibula Left Result Date: 08/04/2023 CLINICAL DATA:  Pain and bruising EXAM: LEFT TIBIA AND FIBULA - 2 VIEW COMPARISON:  01/08/2023 FINDINGS: Diffuse soft tissue swelling. Vascular calcifications. No fracture or dislocation. Advanced knee arthritis  with postsurgical changes of the patella. IMPRESSION: Diffuse soft tissue swelling. No acute osseous abnormality. Advanced knee arthritis. Electronically Signed   By: Jasmine Pang M.D.   On: 08/04/2023 23:19   (Echo, Carotid, EGD, Colonoscopy, ERCP)    Subjective: No complaints  Discharge Exam: Vitals:   08/08/23 0413 08/08/23 0820  BP: (!) 92/52 127/61  Pulse: (!) 57 61  Resp: 18 18  Temp: 97.6 F (36.4 C) 97.7 F (36.5 C)  SpO2: 94% 95%   Vitals:   08/07/23 1928 08/08/23 0413 08/08/23 0500 08/08/23 0820  BP: (!) 156/133 (!) 92/52  127/61  Pulse: (!) 45 (!) 57  61  Resp: 16 18  18   Temp: (!) 97.3 F (36.3 C) 97.6 F (36.4 C)  97.7 F (36.5 C)  TempSrc:  Oral  Oral  SpO2: 100% 94%  95%  Weight:   96.4 kg   Height:        General: Pt is alert, awake, not in acute distress Cardiovascular: RRR, S1/S2 +, no rubs, no gallops Respiratory: CTA bilaterally, no wheezing, no rhonchi Abdominal: Soft, NT, ND, bowel sounds +  Extremities: no edema, no cyanosis    The results of significant diagnostics from this hospitalization (including imaging, microbiology, ancillary and laboratory) are listed below for reference.     Microbiology: Recent Results (from the past 240 hours)  Blood Culture (routine x 2)     Status: None (Preliminary result)   Collection Time: 08/05/23 12:13 AM   Specimen: BLOOD RIGHT HAND  Result Value Ref Range Status   Specimen Description BLOOD RIGHT HAND  Final   Special Requests   Final    BOTTLES DRAWN AEROBIC ONLY Blood Culture adequate volume   Culture   Final    NO GROWTH 3 DAYS Performed at [facility], [address], [place], [place] [address]    Report Status PENDING  Incomplete  Blood Culture (routine x 2)     Status: None (Preliminary result)   Collection Time: 08/05/23 12:13 AM   Specimen: BLOOD LEFT HAND  Result Value Ref Range Status   Specimen Description BLOOD LEFT HAND  Final   Special Requests   Final    BOTTLES DRAWN  AEROBIC ONLY Blood Culture adequate volume   Culture   Final    NO GROWTH 3 DAYS Performed at [facility], [address] [place], [place] [address]    Report Status PENDING  Incomplete  Resp panel by RT-PCR (RSV, Flu A&B, Covid) Anterior Nasal Swab     Status: None   Collection Time: 08/05/23  1:49 AM   Specimen: Anterior Nasal Swab  Result Value Ref Range Status   SARS Coronavirus 2 by RT PCR NEGATIVE NEGATIVE Final   Influenza A by PCR NEGATIVE NEGATIVE Final   Influenza B by PCR NEGATIVE NEGATIVE Final    Comment: (NOTE) The Xpert Xpress SARS-CoV-2/FLU/RSV plus assay is intended as an aid in the diagnosis of influenza from Nasopharyngeal swab specimens and should not be used as a sole basis for treatment. Nasal washings and aspirates are unacceptable for Xpert Xpress SARS-CoV-2/FLU/RSV testing.  Fact Sheet for Patients: BloggerCourse.com  Fact Sheet for Healthcare Providers: SeriousBroker.it  This test is not yet approved or cleared by the [place] FDA and has been authorized for detection and/or diagnosis of SARS-CoV-2 by FDA under an Emergency Use Authorization (EUA). This EUA will remain in effect (meaning this test can be used) for the duration of the COVID-19 declaration under Section 564(b)(1) of the Act, 21 U.S.C. section 360bbb-3(b)(1), unless the authorization is terminated or revoked.     Resp Syncytial Virus by PCR NEGATIVE NEGATIVE Final    Comment: (NOTE) Fact Sheet for Patients: BloggerCourse.com  Fact Sheet for Healthcare Providers: SeriousBroker.it  This test is not yet approved or cleared by the [place] FDA and has been authorized for detection and/or diagnosis of SARS-CoV-2 by FDA under an Emergency Use Authorization (EUA). This EUA will remain in effect (meaning this test can be used) for the duration of the COVID-19 declaration  under Section 564(b)(1) of the Act, 21 U.S.C. section 360bbb-3(b)(1), unless the authorization is terminated or revoked.  Performed at [facility], [address], [place], [place] [address]   MRSA Next Gen by PCR, Nasal     Status: None   Collection Time: 08/05/23  5:15 AM   Specimen: Urine, Clean Catch; Nasal Swab  Result Value Ref Range Status   MRSA by PCR Next Gen NOT DETECTED NOT DETECTED Final    Comment: (NOTE) The GeneXpert MRSA Assay (FDA approved for NASAL specimens only), is one  component of a comprehensive MRSA colonization surveillance program. It is not intended to diagnose MRSA infection nor to guide or monitor treatment for MRSA infections. Test performance is not FDA approved in patients less than 75 years old. Performed at [facility], [address], [place], [place] [address]      Labs: BNP (last 3 results) Recent Labs    06/03/23 1913 06/13/23 0250 08/05/23 0014  BNP 81.1 54.6 364.2*   Basic Metabolic Panel: Recent Labs  Lab 08/05/23 0014 08/06/23 1019 08/07/23 0641 08/07/23 1854 08/08/23 0622  NA 134* 133* 127* 129* 127*  K 4.3 4.2 4.0 3.9 4.1  CL 100 96* 90* 90* 91*  CO2 24 25 27 26 26   GLUCOSE 111* 115* 139* 129* 138*  BUN 12 13 12 13 19   CREATININE 0.46 0.45 0.52 0.80 0.75  CALCIUM 9.3 9.1 8.7* 9.1 8.7*  MG  --  1.3*  --  1.7 1.8   Liver Function Tests: Recent Labs  Lab 08/05/23 0014  AST 31  ALT 26  ALKPHOS 54  BILITOT 1.1  PROT 6.3*  ALBUMIN 3.2*   No results for input(s): "LIPASE", "AMYLASE" in the last 168 hours. No results for input(s): "AMMONIA" in the last 168 hours. CBC: Recent Labs  Lab 08/05/23 0014 08/06/23 0813 08/08/23 0622  WBC 11.5* 10.4 10.9*  NEUTROABS 6.4  --   --   HGB 13.3 13.0 13.0  HCT 39.6 38.6 37.8  MCV 95.2 93.9 92.0  PLT 281 335 334   Cardiac Enzymes: No results for input(s): "CKTOTAL", "CKMB", "CKMBINDEX", "TROPONINI" in the last 168 hours. BNP: Invalid input(s):  "POCBNP" CBG: No results for input(s): "GLUCAP" in the last 168 hours. D-Dimer No results for input(s): "DDIMER" in the last 72 hours. Hgb A1c No results for input(s): "HGBA1C" in the last 72 hours. Lipid Profile No results for input(s): "CHOL", "HDL", "LDLCALC", "TRIG", "CHOLHDL", "LDLDIRECT" in the last 72 hours. Thyroid function studies Recent Labs    08/07/23 1854  TSH 3.367   Anemia work up No results for input(s): "VITAMINB12", "FOLATE", "FERRITIN", "TIBC", "IRON", "RETICCTPCT" in the last 72 hours. Urinalysis    Component Value Date/Time   COLORURINE YELLOW 08/05/2023 0517   APPEARANCEUR CLEAR 08/05/2023 0517   LABSPEC 1.010 08/05/2023 0517   PHURINE 6.0 08/05/2023 0517   GLUCOSEU NEGATIVE 08/05/2023 0517   HGBUR NEGATIVE 08/05/2023 0517   BILIRUBINUR NEGATIVE 08/05/2023 0517   BILIRUBINUR negative 09/28/2020 1544   KETONESUR NEGATIVE 08/05/2023 0517   PROTEINUR NEGATIVE 08/05/2023 0517   UROBILINOGEN 0.2 09/28/2020 1544   UROBILINOGEN 0.2 09/01/2013 2145   NITRITE NEGATIVE 08/05/2023 0517   LEUKOCYTESUR MODERATE (A) 08/05/2023 0517   Sepsis Labs Recent Labs  Lab 08/05/23 0014 08/06/23 0813 08/08/23 0622  WBC 11.5* 10.4 10.9*   Microbiology Recent Results (from the past 240 hours)  Blood Culture (routine x 2)     Status: None (Preliminary result)   Collection Time: 08/05/23 12:13 AM   Specimen: BLOOD RIGHT HAND  Result Value Ref Range Status   Specimen Description BLOOD RIGHT HAND  Final   Special Requests   Final    BOTTLES DRAWN AEROBIC ONLY Blood Culture adequate volume   Culture   Final    NO GROWTH 3 DAYS Performed at [facility], [address] [place], [place] [address]    Report Status PENDING  Incomplete  Blood Culture (routine x 2)     Status: None (Preliminary result)   Collection Time: 08/05/23  12:13 AM   Specimen: BLOOD LEFT HAND  Result Value Ref Range Status   Specimen Description BLOOD LEFT HAND  Final   Special Requests    Final    BOTTLES DRAWN AEROBIC ONLY Blood Culture adequate volume   Culture   Final    NO GROWTH 3 DAYS Performed at [facility], [address] [place], [place] [address]    Report Status PENDING  Incomplete  Resp panel by RT-PCR (RSV, Flu A&B, Covid) Anterior Nasal Swab     Status: None   Collection Time: 08/05/23  1:49 AM   Specimen: Anterior Nasal Swab  Result Value Ref Range Status   SARS Coronavirus 2 by RT PCR NEGATIVE NEGATIVE Final   Influenza A by PCR NEGATIVE NEGATIVE Final   Influenza B by PCR NEGATIVE NEGATIVE Final    Comment: (NOTE) The Xpert Xpress SARS-CoV-2/FLU/RSV plus assay is intended as an aid in the diagnosis of influenza from Nasopharyngeal swab specimens and should not be used as a sole basis for treatment. Nasal washings and aspirates are unacceptable for Xpert Xpress SARS-CoV-2/FLU/RSV testing.  Fact Sheet for Patients: BloggerCourse.com  Fact Sheet for Healthcare Providers: SeriousBroker.it  This test is not yet approved or cleared by the [place] FDA and has been authorized for detection and/or diagnosis of SARS-CoV-2 by FDA under an Emergency Use Authorization (EUA). This EUA will remain in effect (meaning this test can be used) for the duration of the COVID-19 declaration under Section 564(b)(1) of the Act, 21 U.S.C. section 360bbb-3(b)(1), unless the authorization is terminated or revoked.     Resp Syncytial Virus by PCR NEGATIVE NEGATIVE Final    Comment: (NOTE) Fact Sheet for Patients: BloggerCourse.com  Fact Sheet for Healthcare Providers: SeriousBroker.it  This test is not yet approved or cleared by the [place] FDA and has been authorized for detection and/or diagnosis of SARS-CoV-2 by FDA under an Emergency Use Authorization (EUA). This EUA will remain in effect (meaning this test can be used) for the duration of  the COVID-19 declaration under Section 564(b)(1) of the Act, 21 U.S.C. section 360bbb-3(b)(1), unless the authorization is terminated or revoked.  Performed at [facility], [address] [place], [place] [address]   MRSA Next Gen by PCR, Nasal     Status: None   Collection Time: 08/05/23  5:15 AM   Specimen: Urine, Clean Catch; Nasal Swab  Result Value Ref Range Status   MRSA by PCR Next Gen NOT DETECTED NOT DETECTED Final    Comment: (NOTE) The GeneXpert MRSA Assay (FDA approved for NASAL specimens only), is one component of a comprehensive MRSA colonization surveillance program. It is not intended to diagnose MRSA infection nor to guide or monitor treatment for MRSA infections. Test performance is not FDA approved in patients less than 52 years old. Performed at [facility], [address] [place], [place] [address]      Time coordinating discharge: Over 35 minutes  SIGNED:   Marinda Elk, MD  [facility] 08/08/2023, 9:40 AM Pager   If 7PM-7AM, please contact night-coverage [email or web address] Password [email or web address]

## 2023-08-08 NOTE — Progress Notes (Signed)
 Patient being discharged via private transportation. Discharge packet review with patient and her husband at bedside, Toc medication given to patient, iv access removed.

## 2023-08-10 LAB — CULTURE, BLOOD (ROUTINE X 2)
Culture: NO GROWTH
Culture: NO GROWTH
Special Requests: ADEQUATE
Special Requests: ADEQUATE

## 2023-08-20 ENCOUNTER — Other Ambulatory Visit: Payer: Self-pay | Admitting: Internal Medicine

## 2023-08-20 DIAGNOSIS — Z1231 Encounter for screening mammogram for malignant neoplasm of breast: Secondary | ICD-10-CM

## 2023-09-12 ENCOUNTER — Encounter (HOSPITAL_BASED_OUTPATIENT_CLINIC_OR_DEPARTMENT_OTHER): Payer: Self-pay

## 2023-09-13 ENCOUNTER — Ambulatory Visit: Admitting: Emergency Medicine

## 2023-09-13 NOTE — Progress Notes (Deleted)
 Cardiology Office Note:    Date:  09/13/2023  ID:  Libera, Malueg 1945-11-24, MRN [id number] PCP: Windell Hasty, DO  [facility] Providers Cardiologist:  Maudine Sos, MD { Click to update primary MD,subspecialty MD or APP then REFRESH:1}    {Click to Open Review  :1}   Patient Profile:      Chief Complaint: *** History of Present Illness:  [name] is a 78 y.o. female with visit-pertinent history of HTN, mild-non-obstructive CAD, Takotsubo syndrome, DM, post-Polio syndrome, and hypothyroidism   Ms. Wrice established with cardiology service on 12/2014 for hypertension and atypical chest pain.  At that appointment she also had lower extremity edema.  Lexiscan  Myoview  12/2014 revealed LVEF 71% with no evidence of ischemia.  She had a Lexiscan  Myoview  08/2016 that revealed LVEF 67% with concern for basal to mid inferior ischemia.  She subsequently had a cardiac CTA revealed 30% LAD stenosis and no obstructive disease. Discussed the use of AI scribe software for clinical note transcription with the patient, who gave verbal consent to proceed.  Metoprolol  was switched to carvedilol  due to high blood pressures and low heart rates.  Amlodipine  was discontinued due to lower extremity edema, which improved after making that change.  Her BP was elevated so furosemide  was switched to HCTZ. This was subsequently held by her PCP due to hyponatremia but later resumed once her sodium improved.   Her blood pressure had been quite labile, so hydrochlorothiazide  was increased.  However then she developed hyponatremia.  It was reduced back to 12.5 mg and hydralazine  was increased.  Her nephrologist stopped hydrochlorothiazide  and started torsemide .  She developed some hypotension and both amlodipine  and hydralazine  were discontinued.   She also had renal Dopplers that revealed normal renal artery flow but 70 to 90% SMA stenosis.  She was referred to Dr. Alvenia Aus who overall felt that her SMA stenosis  was contributing to her hypertension.  She went for second opinion at [facility] who agreed.  She was last seen in clinic on 05/26/2022 by Dr. Theodis Fiscal.  She had called the office reporting increased swelling and shortness of breath.  Her amlodipine  was discontinued and she was started on spironolactone  12.5 mg daily.  Most recently she was admitted in the hospital from 3/22 through 08/08/2023 for cellulitis of lower extremity and acute on chronic diastolic heart failure.  She was started on IV Lasix  and then transition to oral torsemide  with net -2 L.  She did have some sinus bradycardia and her carvedilol  was held.  Her ARB was also held during admission for her to continue as outpatient.  History of Present Illness     Review of systems:  Please see the history of present illness. All other systems are reviewed and otherwise negative. ***     Home Medications:    No outpatient medications have been marked as taking for the 09/13/23 encounter (Appointment) with Ava Boatman, NP.   Studies Reviewed:       *** Risk Assessment/Calculations:   {Does this patient have ATRIAL FIBRILLATION?:[id number]} No BP recorded.  {Refresh Note OR Click here to enter BP  :1}***       Physical Exam:   VS:  There were no vitals taken for this visit.   Wt Readings from Last 3 Encounters:  08/08/23 212 lb 8.4 oz (96.4 kg)  07/09/23 180 lb 12.4 oz (82 kg)  06/03/23 179 lb 14.3 oz (81.6 kg)    GEN: Well nourished, well  developed in no acute distress NECK: No JVD; No carotid bruits CARDIAC: ***RRR, no murmurs, rubs, gallops RESPIRATORY:  Clear to auscultation without rales, wheezing or rhonchi  ABDOMEN: Soft, non-tender, non-distended EXTREMITIES:  No edema; No acute deformity ***     Assessment and Plan:  Assessment and Plan Assessment & Plan      {Are you ordering a CV Procedure (e.g. stress test, cath, DCCV, TEE, etc)?   Press F2        :[id number]}  Dispo:  No follow-ups on  file.  Signed, Ava Boatman, NP

## 2023-09-20 ENCOUNTER — Ambulatory Visit
Admission: RE | Admit: 2023-09-20 | Discharge: 2023-09-20 | Disposition: A | Discharge status: Home / Self Care | Source: Ambulatory Visit | Attending: Internal Medicine | Admitting: Internal Medicine

## 2023-09-20 DIAGNOSIS — Z1231 Encounter for screening mammogram for malignant neoplasm of breast: Secondary | ICD-10-CM

## 2023-09-25 ENCOUNTER — Encounter: Payer: Self-pay | Admitting: Physical Medicine and Rehabilitation

## 2023-09-25 ENCOUNTER — Other Ambulatory Visit: Payer: Self-pay | Admitting: Physical Medicine and Rehabilitation

## 2023-09-25 DIAGNOSIS — M5459 Other low back pain: Secondary | ICD-10-CM

## 2023-10-02 ENCOUNTER — Inpatient Hospital Stay (HOSPITAL_COMMUNITY)
Admission: EM | Admit: 2023-10-02 | Discharge: 2023-10-05 | DRG: 644 | Disposition: A | Discharge status: Home / Self Care | Attending: Family Medicine | Admitting: Family Medicine

## 2023-10-02 ENCOUNTER — Other Ambulatory Visit: Payer: Self-pay

## 2023-10-02 ENCOUNTER — Emergency Department (HOSPITAL_COMMUNITY)

## 2023-10-02 DIAGNOSIS — G8929 Other chronic pain: Principal | ICD-10-CM | POA: Diagnosis present

## 2023-10-02 DIAGNOSIS — M5126 Other intervertebral disc displacement, lumbar region: Secondary | ICD-10-CM | POA: Diagnosis present

## 2023-10-02 DIAGNOSIS — Z886 Allergy status to analgesic agent status: Secondary | ICD-10-CM

## 2023-10-02 DIAGNOSIS — E7800 Pure hypercholesterolemia, unspecified: Secondary | ICD-10-CM | POA: Diagnosis present

## 2023-10-02 DIAGNOSIS — Z880 Allergy status to penicillin: Secondary | ICD-10-CM

## 2023-10-02 DIAGNOSIS — M159 Polyosteoarthritis, unspecified: Secondary | ICD-10-CM | POA: Diagnosis present

## 2023-10-02 DIAGNOSIS — Z833 Family history of diabetes mellitus: Secondary | ICD-10-CM

## 2023-10-02 DIAGNOSIS — Z79899 Other long term (current) drug therapy: Secondary | ICD-10-CM

## 2023-10-02 DIAGNOSIS — Z881 Allergy status to other antibiotic agents status: Secondary | ICD-10-CM

## 2023-10-02 DIAGNOSIS — Z882 Allergy status to sulfonamides status: Secondary | ICD-10-CM

## 2023-10-02 DIAGNOSIS — Z7982 Long term (current) use of aspirin: Secondary | ICD-10-CM

## 2023-10-02 DIAGNOSIS — Z8249 Family history of ischemic heart disease and other diseases of the circulatory system: Secondary | ICD-10-CM

## 2023-10-02 DIAGNOSIS — Z885 Allergy status to narcotic agent status: Secondary | ICD-10-CM

## 2023-10-02 DIAGNOSIS — G4700 Insomnia, unspecified: Secondary | ICD-10-CM | POA: Diagnosis present

## 2023-10-02 DIAGNOSIS — R4189 Other symptoms and signs involving cognitive functions and awareness: Secondary | ICD-10-CM | POA: Diagnosis present

## 2023-10-02 DIAGNOSIS — E871 Hypo-osmolality and hyponatremia: Secondary | ICD-10-CM | POA: Diagnosis present

## 2023-10-02 DIAGNOSIS — E119 Type 2 diabetes mellitus without complications: Secondary | ICD-10-CM | POA: Diagnosis present

## 2023-10-02 DIAGNOSIS — M7989 Other specified soft tissue disorders: Secondary | ICD-10-CM | POA: Diagnosis present

## 2023-10-02 DIAGNOSIS — Z7401 Bed confinement status: Secondary | ICD-10-CM

## 2023-10-02 DIAGNOSIS — Z90710 Acquired absence of both cervix and uterus: Secondary | ICD-10-CM

## 2023-10-02 DIAGNOSIS — Z79890 Hormone replacement therapy (postmenopausal): Secondary | ICD-10-CM

## 2023-10-02 DIAGNOSIS — E222 Syndrome of inappropriate secretion of antidiuretic hormone: Principal | ICD-10-CM | POA: Diagnosis present

## 2023-10-02 DIAGNOSIS — F419 Anxiety disorder, unspecified: Secondary | ICD-10-CM | POA: Diagnosis present

## 2023-10-02 DIAGNOSIS — R001 Bradycardia, unspecified: Secondary | ICD-10-CM | POA: Diagnosis present

## 2023-10-02 DIAGNOSIS — R519 Headache, unspecified: Secondary | ICD-10-CM | POA: Diagnosis present

## 2023-10-02 DIAGNOSIS — Z6841 Body Mass Index (BMI) 40.0 and over, adult: Secondary | ICD-10-CM

## 2023-10-02 DIAGNOSIS — I5022 Chronic systolic (congestive) heart failure: Secondary | ICD-10-CM | POA: Diagnosis present

## 2023-10-02 DIAGNOSIS — I110 Hypertensive heart disease with heart failure: Secondary | ICD-10-CM | POA: Diagnosis present

## 2023-10-02 DIAGNOSIS — N319 Neuromuscular dysfunction of bladder, unspecified: Secondary | ICD-10-CM | POA: Diagnosis present

## 2023-10-02 DIAGNOSIS — Z888 Allergy status to other drugs, medicaments and biological substances status: Secondary | ICD-10-CM

## 2023-10-02 DIAGNOSIS — G14 Postpolio syndrome: Secondary | ICD-10-CM | POA: Diagnosis present

## 2023-10-02 DIAGNOSIS — Z9049 Acquired absence of other specified parts of digestive tract: Secondary | ICD-10-CM

## 2023-10-02 DIAGNOSIS — K219 Gastro-esophageal reflux disease without esophagitis: Secondary | ICD-10-CM | POA: Diagnosis present

## 2023-10-02 DIAGNOSIS — E039 Hypothyroidism, unspecified: Secondary | ICD-10-CM | POA: Diagnosis present

## 2023-10-02 DIAGNOSIS — Z7984 Long term (current) use of oral hypoglycemic drugs: Secondary | ICD-10-CM

## 2023-10-02 LAB — CBC
HCT: 36.8 % (ref 36.0–46.0)
Hemoglobin: 13.2 g/dL (ref 12.0–15.0)
MCH: 31.9 pg (ref 26.0–34.0)
MCHC: 35.9 g/dL (ref 30.0–36.0)
MCV: 88.9 fL (ref 80.0–100.0)
Platelets: 420 10*3/uL — ABNORMAL HIGH (ref 150–400)
RBC: 4.14 MIL/uL (ref 3.87–5.11)
RDW: 12.9 % (ref 11.5–15.5)
WBC: 10.9 10*3/uL — ABNORMAL HIGH (ref 4.0–10.5)
nRBC: 0 % (ref 0.0–0.2)

## 2023-10-02 MED ORDER — FENTANYL CITRATE PF 50 MCG/ML IJ SOSY
25.0000 ug | PREFILLED_SYRINGE | Freq: Once | INTRAMUSCULAR | Status: AC
  Administered 2023-10-02: 25 ug via INTRAVENOUS
  Filled 2023-10-02: qty 1
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
## 2023-10-02 NOTE — ED Provider Notes (Signed)
 [place] EMERGENCY DEPARTMENT AT [facility] Provider Note   CSN: [id number] Arrival date & time: 10/02/23  2226     History  Chief Complaint  Patient presents with   Back Pain    [name] is a 78 y.o. female with history to include type 2 diabetes, postpolio syndrome, migraine, memory changes, insomnia, hypothyroid, hypertension, headaches, GERD, chronic low back pain, chronic back pain, CHF.  Patient presents to ED for evaluation of low back pain.  States that she has had 2 weeks ago of low back pain.  Reports that prior to this, she has had no back pain in her past.  On chart review, the patient had MRI of the lumbar spine on 07/18/2022.  She reports she is unable to remember this or why this exam was performed.  She states that over the last 2 weeks her back pain has waxed and waned.  She takes tramadol  at home as well as gabapentin  and these are not controlling her pain.  She reports that 1 week ago when being transferred from her wheelchair to the bed she suffered an increase in low back pain but denies any injury that occurred during this.  Denies being dropped.  She denies any bowel or bladder incontinence.  She denies any numbness in her groin.  She denies any distal weakness beyond her baseline distal weakness.  She denies any fevers at home, dysuria, flank pain.  She denies any chest pain or shortness of breath.  Patient was seen on 09/17/2022 by orthopedic surgeon who ordered MRI of the lumbar spine to be done at future date. No history of IVDU.   Back Pain Associated symptoms: no numbness and no weakness        Home Medications Prior to Admission medications   Medication Sig Start Date End Date Taking? Authorizing Provider  aspirin  EC 81 MG tablet Take 81 mg by mouth every evening. Swallow whole.    [provider]  atorvastatin  (LIPITOR) 40 MG tablet Take 40 mg by mouth at bedtime.    [provider]  B Complex Vitamins (VITAMIN B COMPLEX  W/B-12 PO) Take 1 tablet by mouth daily. 07/09/23   [provider]  Calcium  Carb-Cholecalciferol (CALCIUM  600+D) 600-20 MG-MCG TABS Take 1 tablet by mouth 2 (two) times daily.    [provider]  cholecalciferol (VITAMIN D3) 25 MCG (1000 UNIT) tablet Take 1,000 Units by mouth daily.    [provider]  clonazePAM  (KLONOPIN ) 0.5 MG tablet Take 0.5 mg by mouth 2 (two) times daily as needed for anxiety.    [provider]  FEROSUL 325 (65 Fe) MG tablet Take 325 mg by mouth daily. 07/09/23   [provider]  gabapentin  (NEURONTIN ) 100 MG capsule Take 100 mg by mouth 2 (two) times daily.    [provider]  levothyroxine  (SYNTHROID , LEVOTHROID) 100 MCG tablet Take 100 mcg by mouth daily before breakfast.    [provider]  loratadine  (CLARITIN ) 10 MG tablet Take 10 mg by mouth daily as needed for allergies. 12/17/19   [provider]  memantine  (NAMENDA ) 5 MG tablet Take 1 tablet (5 mg total) by mouth 2 (two) times daily. Patient taking differently: Take 5 mg by mouth daily. 06/19/23   Dohmeier, Raoul Byes, MD  metFORMIN  (GLUCOPHAGE -XR) 500 MG 24 hr tablet Take 500 mg by mouth every evening. 08/02/23   [provider]  mirabegron  ER (MYRBETRIQ ) 50 MG TB24 tablet Take 50 mg by mouth daily.  [provider]  Multiple Vitamins-Minerals (MULTIVITAMIN WITH MINERALS) tablet Take 1 tablet by mouth daily.    [provider]  pantoprazole  (PROTONIX ) 40 MG tablet Take 1 tablet by mouth daily. Take 1 tab daily 12/21/14   [provider]  telmisartan (MICARDIS) 80 MG tablet Take 80 mg by mouth daily at 12 noon. 12/14/14   [provider]  tiZANidine  (ZANAFLEX ) 4 MG tablet Take 4 mg by mouth at bedtime. PAIN    [provider]  torsemide  (DEMADEX ) 5 MG tablet Take 5 mg by mouth daily.    [provider]  vitamin E 180 MG (400 UNITS) capsule Take 400 Units by mouth daily.    [provider]  zolpidem  (AMBIEN ) 5 MG tablet Take 5 mg by mouth at bedtime as needed for sleep.    [provider]      Allergies    Tamsulosin , Ciprofloxacin, Carisoprodol, Oxycodone -acetaminophen , Penicillins, Sulfa antibiotics, Aspirin , Macrodantin [nitrofurantoin macrocrystal], Nsaids, Percocet [oxycodone -acetaminophen ], Propoxyphene, and Propoxyphene n-acetaminophen     Review of Systems   Review of Systems  Musculoskeletal:  Positive for back pain.  Neurological:  Negative for weakness and numbness.  All other systems reviewed and are negative.   Physical Exam Updated Vital Signs BP (!) 110/41   Pulse (!) 57   Temp 98 F (36.7 C) (Oral)   Resp 12   Ht 5' (1.524 m)   Wt 95.3 kg   SpO2 92%   BMI 41.01 kg/m  Physical Exam Vitals and nursing note reviewed.  Constitutional:      General: She is not in acute distress.    Appearance: She is well-developed.  HENT:     Head: Normocephalic and atraumatic.  Eyes:     Conjunctiva/sclera: Conjunctivae normal.  Cardiovascular:     Rate and Rhythm: Normal rate and regular rhythm.     Heart sounds: No murmur heard. Pulmonary:     Effort: Pulmonary effort is normal. No respiratory distress.     Breath sounds: Normal breath sounds.  Abdominal:     Palpations: Abdomen is soft.     Tenderness: There is no abdominal tenderness.  Musculoskeletal:        General: No swelling.     Cervical back: Neck supple.       Back:  Skin:    General: Skin is warm and dry.     Capillary Refill: Capillary refill takes less than 2 seconds.  Neurological:     Mental Status: She is alert.     Comments: Equal strength to bilateral lower extremities  Psychiatric:        Mood and Affect: Mood normal.     ED Results / Procedures / Treatments   Labs (all labs ordered are listed, but only abnormal results are displayed) Labs Reviewed  CBC - Abnormal; Notable for the following components:      Result Value   WBC 10.9 (*)    Platelets 420 (*)     All other components within normal limits  BASIC METABOLIC PANEL WITH GFR - Abnormal; Notable for the following components:   Sodium 119 (*)    Chloride 84 (*)    Glucose, Bld 173 (*)    Creatinine, Ser 0.41 (*)    Calcium  8.5 (*)    All other components within normal limits  NA AND K (SODIUM & POTASSIUM), RAND UR  BRAIN NATRIURETIC PEPTIDE    EKG None  Radiology DG Chest Portable 1 View Result Date: 10/03/2023 CLINICAL  DATA:  Difficulty breathing EXAM: PORTABLE CHEST 1 VIEW COMPARISON:  08/04/2023 FINDINGS: Cardiac shadow is within normal limits. No significant vascular congestion is seen. The lungs are clear. No bony abnormality is noted. IMPRESSION: No active disease. Electronically Signed   By: Violeta Grey M.D.   On: 10/03/2023 02:55   CT Head Wo Contrast Result Date: 10/03/2023 CLINICAL DATA:  Increasing headaches EXAM: CT HEAD WITHOUT CONTRAST TECHNIQUE: Contiguous axial images were obtained from the base of the skull through the vertex without intravenous contrast. RADIATION DOSE REDUCTION: This exam was performed according to the departmental dose-optimization program which includes automated exposure control, adjustment of the mA and/or kV according to patient size and/or use of iterative reconstruction technique. COMPARISON:  06/03/2023 FINDINGS: Brain: No evidence of acute infarction, hemorrhage, hydrocephalus, extra-axial collection or mass lesion/mass effect. Chronic atrophic and ischemic changes are noted. Vascular: No hyperdense vessel or unexpected calcification. Skull: Normal. Negative for fracture or focal lesion. Sinuses/Orbits: No acute finding. Other: None. IMPRESSION: Chronic atrophic and ischemic changes.  No acute abnormality seen. Electronically Signed   By: Violeta Grey M.D.   On: 10/03/2023 02:13   MR LUMBAR SPINE WO CONTRAST Result Date: 10/03/2023 CLINICAL DATA:  Initial evaluation for acute low back pain. EXAM: MRI LUMBAR SPINE WITHOUT CONTRAST TECHNIQUE:  Multiplanar, multisequence MR imaging of the lumbar spine was performed. No intravenous contrast was administered. COMPARISON:  Prior study from 07/17/2022 FINDINGS: Segmentation: Standard. Lowest well-formed disc space labeled the L5-S1 level. Alignment: Severe levoscoliosis, apex at L2-3. Alignment otherwise normal preservation of the normal lumbar lordosis. Vertebrae: Vertebral body height maintained without acute or chronic fracture. Bone marrow signal intensity within normal limits. No worrisome osseous lesions. Mild degenerative reactive endplate changes present about the L1-2 interspace. No other abnormal marrow edema. Conus medullaris and cauda equina: Conus extends to the L1 level. Conus and cauda equina appear normal. Paraspinal and other soft tissues: No acute finding. Chronic fatty atrophy within the posterior paraspinous and psoas musculature. Disc levels: L1-2: Diffuse disc bulge with reactive endplate spurring. Moderate right worse than left facet hypertrophy. Associated small joint effusions. Mild narrowing of the right lateral recess. Central canal remains patent. Foramina remain patent. L2-3: Degenerative vertebral disc space narrowing with diffuse disc bulge and disc desiccation. Reactive endplate change with marginal endplate osteophytic spurring. Mild to moderate bilateral facet hypertrophy. Resultant mild narrowing of the right lateral recess. Central canal remains patent. Moderate right L2 foraminal stenosis. Left neural foramina remains patent. L3-4: Disc desiccation with mild disc bulge. Disc bulging slightly asymmetric to the right. Moderate right worse than left facet hypertrophy with small joint effusions. Resultant mild bilateral subarticular stenosis. Central canal remains patent. Mild bilateral L3 foraminal narrowing. L4-5: Disc desiccation with diffuse disc bulge. Superimposed left foraminal to extraforaminal disc protrusion (series 12, image 23). Moderate bilateral facet hypertrophy  with associated small joint effusions. Resultant mild canal with moderate to severe left lateral recess stenosis. Moderate to severe left foraminal narrowing. Right neural foramen remains patent. L5-S1: Small left foraminal disc protrusion closely approximates the exiting left L5 nerve root (series 12, image 28). Mild left greater than right facet hypertrophy. No spinal stenosis. Mild left L5 foraminal narrowing. Right neural foramen remains patent. IMPRESSION: 1. No acute abnormality within the lumbar spine. 2. Severe levoscoliosis with multilevel degenerative spondylosis and facet arthrosis as above. Resultant mild lateral recess narrowing at L2-3 and L3-4, with moderate to severe left lateral recess stenosis at L4-5. 3. Left foraminal to extraforaminal disc protrusion at L4-5,  potentially affecting the exiting left L4 nerve root. 4. Small left foraminal disc protrusion at L5-S1, potentially affecting the exiting left L5 nerve root. 5. Moderate right L2 foraminal stenosis related to disc bulge, reactive endplate change, and facet hypertrophy. Electronically Signed   By: Virgia Griffins M.D.   On: 10/03/2023 00:55    Procedures Procedures    Medications Ordered in ED Medications  fentaNYL  (SUBLIMAZE ) injection 25 mcg (25 mcg Intravenous Given 10/02/23 2320)  metoCLOPramide  (REGLAN ) injection 10 mg (10 mg Intravenous Given 10/03/23 0213)  diphenhydrAMINE (BENADRYL) injection 25 mg (25 mg Intravenous Given 10/03/23 1610)  ketorolac  (TORADOL ) 15 MG/ML injection 15 mg (15 mg Intravenous Given 10/03/23 0212)  sodium chloride  0.9 % bolus 1,000 mL (1,000 mLs Intravenous New Bag/Given 10/03/23 0239)    ED Course/ Medical Decision Making/ A&P  Medical Decision Making Amount and/or Complexity of Data Reviewed Labs: ordered. Radiology: ordered.  Risk Prescription drug management.   78 year old female presents for evaluation.  Please see HPI for further details.  On examination patient is afebrile  and nontachycardic.  Her lung sounds are clear bilaterally, she has nonhypoxic.  Abdomen soft and compressible.  Neurological examination at baseline.  Baseline strength bilateral lower extremity per patient.  Overall nontoxic appearance with reassuring vital signs.  Patient chart reviewed.  Patient orthopedic doctor was planning to have MRI of lumbar spine without contrast performed to assess for low back pain causes.  Will perform this test here today.  Provided patient with 25 mcg fentanyl  for pain control.  Will check basic labs to include CBC, BMP.  MRI of lumbar spine shows no acute changes.  There are noted to be chronic changes but no acute abnormalities noted.  Went back to the patient room to advise her of the findings, she stated to me that she had had a headache for 2 weeks.  She states that she had "forgot" to tell me about this on initial examination.  Requesting CT scan of head.  Will assess with CT scan of head.  Will provide migraine cocktail.  Update: Patient labs resulted.  Patient CBC with slight leukocytosis of 10.9, baseline hemoglobin 13.2.  Metabolic panel shows sodium 119, anion gap 10, potassium 3.7.  Patient denies chest pain or shortness of breath.  Reports she has been taking prescribed torsemide  as directed twice a day.  Her baseline sodium is between 127 and 129.  Will collect BNP, chest x-ray.  Plan to admit.  Update: Patient BNP not elevated at 44.8.  Checked urine sodium, sodium 31, testing 14.  Chest x-ray shows no pleural effusions.  Doubt CHF exacerbation.  Will admit patient to the hospital at this time due to hyponatremia.  Discussed with Dr. [facility] Lavender.  Stable on admission.   Final Clinical Impression(s) / ED Diagnoses Final diagnoses:  Chronic bilateral low back pain without sciatica  Nonintractable headache, unspecified chronicity pattern, unspecified headache type  Hyponatremia    Rx / DC Orders ED Discharge Orders     None         Adel Aden, PA-C 10/03/23 0420    Hershel Los, MD 10/03/23 [phone]

## 2023-10-03 ENCOUNTER — Observation Stay (HOSPITAL_COMMUNITY)

## 2023-10-03 ENCOUNTER — Encounter (HOSPITAL_COMMUNITY): Payer: Self-pay | Admitting: Family Medicine

## 2023-10-03 ENCOUNTER — Emergency Department (HOSPITAL_COMMUNITY)

## 2023-10-03 DIAGNOSIS — R001 Bradycardia, unspecified: Secondary | ICD-10-CM | POA: Diagnosis present

## 2023-10-03 DIAGNOSIS — M159 Polyosteoarthritis, unspecified: Secondary | ICD-10-CM | POA: Diagnosis present

## 2023-10-03 DIAGNOSIS — E039 Hypothyroidism, unspecified: Secondary | ICD-10-CM | POA: Diagnosis present

## 2023-10-03 DIAGNOSIS — Z833 Family history of diabetes mellitus: Secondary | ICD-10-CM | POA: Diagnosis not present

## 2023-10-03 DIAGNOSIS — K219 Gastro-esophageal reflux disease without esophagitis: Secondary | ICD-10-CM | POA: Diagnosis present

## 2023-10-03 DIAGNOSIS — E7800 Pure hypercholesterolemia, unspecified: Secondary | ICD-10-CM | POA: Diagnosis present

## 2023-10-03 DIAGNOSIS — G4700 Insomnia, unspecified: Secondary | ICD-10-CM | POA: Diagnosis present

## 2023-10-03 DIAGNOSIS — Z8249 Family history of ischemic heart disease and other diseases of the circulatory system: Secondary | ICD-10-CM | POA: Diagnosis not present

## 2023-10-03 DIAGNOSIS — E871 Hypo-osmolality and hyponatremia: Secondary | ICD-10-CM | POA: Diagnosis present

## 2023-10-03 DIAGNOSIS — M5126 Other intervertebral disc displacement, lumbar region: Secondary | ICD-10-CM | POA: Diagnosis present

## 2023-10-03 DIAGNOSIS — I10 Essential (primary) hypertension: Secondary | ICD-10-CM | POA: Diagnosis not present

## 2023-10-03 DIAGNOSIS — F419 Anxiety disorder, unspecified: Secondary | ICD-10-CM | POA: Diagnosis present

## 2023-10-03 DIAGNOSIS — E222 Syndrome of inappropriate secretion of antidiuretic hormone: Secondary | ICD-10-CM | POA: Diagnosis present

## 2023-10-03 DIAGNOSIS — N319 Neuromuscular dysfunction of bladder, unspecified: Secondary | ICD-10-CM | POA: Diagnosis present

## 2023-10-03 DIAGNOSIS — G8929 Other chronic pain: Secondary | ICD-10-CM | POA: Diagnosis present

## 2023-10-03 DIAGNOSIS — Z6841 Body Mass Index (BMI) 40.0 and over, adult: Secondary | ICD-10-CM | POA: Diagnosis not present

## 2023-10-03 DIAGNOSIS — I110 Hypertensive heart disease with heart failure: Secondary | ICD-10-CM | POA: Diagnosis present

## 2023-10-03 DIAGNOSIS — R4189 Other symptoms and signs involving cognitive functions and awareness: Secondary | ICD-10-CM | POA: Diagnosis present

## 2023-10-03 DIAGNOSIS — E119 Type 2 diabetes mellitus without complications: Secondary | ICD-10-CM | POA: Diagnosis present

## 2023-10-03 DIAGNOSIS — M7989 Other specified soft tissue disorders: Secondary | ICD-10-CM | POA: Diagnosis present

## 2023-10-03 DIAGNOSIS — G14 Postpolio syndrome: Secondary | ICD-10-CM | POA: Diagnosis present

## 2023-10-03 DIAGNOSIS — Z79890 Hormone replacement therapy (postmenopausal): Secondary | ICD-10-CM | POA: Diagnosis not present

## 2023-10-03 DIAGNOSIS — I5022 Chronic systolic (congestive) heart failure: Secondary | ICD-10-CM | POA: Diagnosis present

## 2023-10-03 DIAGNOSIS — Z7984 Long term (current) use of oral hypoglycemic drugs: Secondary | ICD-10-CM | POA: Diagnosis not present

## 2023-10-03 DIAGNOSIS — I5181 Takotsubo syndrome: Secondary | ICD-10-CM | POA: Diagnosis not present

## 2023-10-03 DIAGNOSIS — R519 Headache, unspecified: Secondary | ICD-10-CM | POA: Diagnosis present

## 2023-10-03 DIAGNOSIS — I5032 Chronic diastolic (congestive) heart failure: Secondary | ICD-10-CM | POA: Diagnosis not present

## 2023-10-03 LAB — HEPATIC FUNCTION PANEL
ALT: 37 U/L (ref 0–44)
AST: 43 U/L — ABNORMAL HIGH (ref 15–41)
Albumin: 3 g/dL — ABNORMAL LOW (ref 3.5–5.0)
Alkaline Phosphatase: 64 U/L (ref 38–126)
Bilirubin, Direct: 0.3 mg/dL — ABNORMAL HIGH (ref 0.0–0.2)
Indirect Bilirubin: 0.7 mg/dL (ref 0.3–0.9)
Total Bilirubin: 1 mg/dL (ref 0.0–1.2)
Total Protein: 6.5 g/dL (ref 6.5–8.1)

## 2023-10-03 LAB — CBC
HCT: 41.6 % (ref 36.0–46.0)
Hemoglobin: 14.7 g/dL (ref 12.0–15.0)
MCH: 31.7 pg (ref 26.0–34.0)
MCHC: 35.3 g/dL (ref 30.0–36.0)
MCV: 89.7 fL (ref 80.0–100.0)
Platelets: 385 10*3/uL (ref 150–400)
RBC: 4.64 MIL/uL (ref 3.87–5.11)
RDW: 12.9 % (ref 11.5–15.5)
WBC: 10 10*3/uL (ref 4.0–10.5)
nRBC: 0 % (ref 0.0–0.2)

## 2023-10-03 LAB — BASIC METABOLIC PANEL WITH GFR
Anion gap: 10 (ref 5–15)
Anion gap: 13 (ref 5–15)
Anion gap: 15 (ref 5–15)
Anion gap: 15 (ref 5–15)
Anion gap: 13 (ref 5–15)
BUN: 10 mg/dL (ref 8–23)
BUN: 10 mg/dL (ref 8–23)
BUN: 10 mg/dL (ref 8–23)
BUN: 8 mg/dL (ref 8–23)
BUN: 7 mg/dL — ABNORMAL LOW (ref 8–23)
CO2: 25 mmol/L (ref 22–32)
CO2: 19 mmol/L — ABNORMAL LOW (ref 22–32)
CO2: 21 mmol/L — ABNORMAL LOW (ref 22–32)
CO2: 22 mmol/L (ref 22–32)
CO2: 21 mmol/L — ABNORMAL LOW (ref 22–32)
Calcium: 8.5 mg/dL — ABNORMAL LOW (ref 8.9–10.3)
Calcium: 8.3 mg/dL — ABNORMAL LOW (ref 8.9–10.3)
Calcium: 8.9 mg/dL (ref 8.9–10.3)
Calcium: 8.6 mg/dL — ABNORMAL LOW (ref 8.9–10.3)
Calcium: 8.6 mg/dL — ABNORMAL LOW (ref 8.9–10.3)
Chloride: 84 mmol/L — ABNORMAL LOW (ref 98–111)
Chloride: 87 mmol/L — ABNORMAL LOW (ref 98–111)
Chloride: 86 mmol/L — ABNORMAL LOW (ref 98–111)
Chloride: 85 mmol/L — ABNORMAL LOW (ref 98–111)
Chloride: 88 mmol/L — ABNORMAL LOW (ref 98–111)
Creatinine, Ser: 0.41 mg/dL — ABNORMAL LOW (ref 0.44–1.00)
Creatinine, Ser: 0.36 mg/dL — ABNORMAL LOW (ref 0.44–1.00)
Creatinine, Ser: 0.59 mg/dL (ref 0.44–1.00)
Creatinine, Ser: 0.6 mg/dL (ref 0.44–1.00)
Creatinine, Ser: 0.58 mg/dL (ref 0.44–1.00)
GFR, Estimated: >60 mL/min (ref >60)
GFR, Estimated: >60 mL/min (ref >60)
GFR, Estimated: >60 mL/min (ref >60)
GFR, Estimated: >60 mL/min (ref >60)
GFR, Estimated: >60 mL/min (ref >60)
Glucose, Bld: 173 mg/dL — ABNORMAL HIGH (ref 70–99)
Glucose, Bld: 117 mg/dL — ABNORMAL HIGH (ref 70–99)
Glucose, Bld: 133 mg/dL — ABNORMAL HIGH (ref 70–99)
Glucose, Bld: 192 mg/dL — ABNORMAL HIGH (ref 70–99)
Glucose, Bld: 154 mg/dL — ABNORMAL HIGH (ref 70–99)
Potassium: 3.7 mmol/L (ref 3.5–5.1)
Potassium: 3.9 mmol/L (ref 3.5–5.1)
Potassium: 4.1 mmol/L (ref 3.5–5.1)
Potassium: 3.4 mmol/L — ABNORMAL LOW (ref 3.5–5.1)
Potassium: 3.3 mmol/L — ABNORMAL LOW (ref 3.5–5.1)
Sodium: 119 mmol/L — CL (ref 135–145)
Sodium: 119 mmol/L — CL (ref 135–145)
Sodium: 122 mmol/L — ABNORMAL LOW (ref 135–145)
Sodium: 122 mmol/L — ABNORMAL LOW (ref 135–145)
Sodium: 122 mmol/L — ABNORMAL LOW (ref 135–145)

## 2023-10-03 LAB — NA AND K (SODIUM & POTASSIUM), RAND UR
Potassium Urine: 14 mmol/L
Sodium, Ur: 31 mmol/L

## 2023-10-03 LAB — CORTISOL: Cortisol, Plasma: 5.9 ug/dL

## 2023-10-03 LAB — BRAIN NATRIURETIC PEPTIDE: B Natriuretic Peptide: 44.8 pg/mL (ref 0.0–100.0)

## 2023-10-03 LAB — OSMOLALITY: Osmolality: 257 mosm/kg — ABNORMAL LOW (ref 275–295)

## 2023-10-03 LAB — HEMOGLOBIN A1C
Hgb A1c MFr Bld: 6 % — ABNORMAL HIGH (ref 4.8–5.6)
Mean Plasma Glucose: 125.5 mg/dL

## 2023-10-03 LAB — TSH: TSH: 11.395 u[IU]/mL — ABNORMAL HIGH (ref 0.350–4.500)

## 2023-10-03 MED ORDER — ACETAMINOPHEN 325 MG PO TABS: 650.0000 mg | ORAL_TABLET | Freq: Four times a day (QID) | ORAL | Status: DC | PRN

## 2023-10-03 MED ORDER — FERROUS SULFATE 325 (65 FE) MG PO TABS
325.0000 mg | ORAL_TABLET | Freq: Every day | ORAL | Status: DC
  Administered 2023-10-03 – 2023-10-05 (×3): 325 mg via ORAL
  Filled 2023-10-03 (×3): qty 1

## 2023-10-03 MED ORDER — DICLOFENAC SODIUM 1 % EX GEL
2.0000 g | Freq: Four times a day (QID) | CUTANEOUS | Status: DC
  Administered 2023-10-03 – 2023-10-05 (×7): 2 g via TOPICAL
  Filled 2023-10-03: qty 100

## 2023-10-03 MED ORDER — METHOCARBAMOL 500 MG PO TABS
500.0000 mg | ORAL_TABLET | Freq: Four times a day (QID) | ORAL | Status: DC | PRN
  Administered 2023-10-03 – 2023-10-05 (×7): 500 mg via ORAL
  Filled 2023-10-03 (×7): qty 1

## 2023-10-03 MED ORDER — GLUCERNA SHAKE PO LIQD
237.0000 mL | Freq: Three times a day (TID) | ORAL | Status: DC
  Administered 2023-10-03 – 2023-10-05 (×5): 237 mL via ORAL

## 2023-10-03 MED ORDER — CLONAZEPAM 0.5 MG PO TABS
0.5000 mg | ORAL_TABLET | Freq: Two times a day (BID) | ORAL | Status: DC | PRN
  Administered 2023-10-03 – 2023-10-05 (×4): 0.5 mg via ORAL
  Filled 2023-10-03 (×4): qty 1

## 2023-10-03 MED ORDER — ASPIRIN 81 MG PO TBEC
81.0000 mg | DELAYED_RELEASE_TABLET | Freq: Every evening | ORAL | Status: DC
  Administered 2023-10-03 – 2023-10-04 (×2): 81 mg via ORAL
  Filled 2023-10-03 (×2): qty 1

## 2023-10-03 MED ORDER — MIRABEGRON ER 50 MG PO TB24
50.0000 mg | ORAL_TABLET | Freq: Every day | ORAL | Status: DC
  Administered 2023-10-03 – 2023-10-05 (×3): 50 mg via ORAL
  Filled 2023-10-03 (×3): qty 1

## 2023-10-03 MED ORDER — GABAPENTIN 100 MG PO CAPS
100.0000 mg | ORAL_CAPSULE | Freq: Every day | ORAL | Status: DC
Start: 2023-10-03 — End: 2023-10-05
  Administered 2023-10-03 – 2023-10-04 (×2): 100 mg via ORAL
  Filled 2023-10-03 (×2): qty 1

## 2023-10-03 MED ORDER — METOCLOPRAMIDE HCL 5 MG/ML IJ SOLN
10.0000 mg | Freq: Once | INTRAMUSCULAR | Status: AC
  Administered 2023-10-03: 10 mg via INTRAVENOUS
  Filled 2023-10-03: qty 2

## 2023-10-03 MED ORDER — POLYETHYLENE GLYCOL 3350 17 G PO PACK
17.0000 g | PACK | Freq: Two times a day (BID) | ORAL | Status: DC
  Administered 2023-10-03 – 2023-10-04 (×4): 17 g via ORAL
  Filled 2023-10-03 (×3): qty 1

## 2023-10-03 MED ORDER — ATORVASTATIN CALCIUM 40 MG PO TABS
40.0000 mg | ORAL_TABLET | Freq: Every day | ORAL | Status: DC
Start: 2023-10-03 — End: 2023-10-05
  Administered 2023-10-03 – 2023-10-04 (×2): 40 mg via ORAL
  Filled 2023-10-03 (×2): qty 1

## 2023-10-03 MED ORDER — SODIUM CHLORIDE 1 G PO TABS
1.0000 g | ORAL_TABLET | Freq: Three times a day (TID) | ORAL | Status: DC
Start: 2023-10-03 — End: 2023-10-03
  Filled 2023-10-03: qty 1

## 2023-10-03 MED ORDER — LEVOTHYROXINE SODIUM 100 MCG PO TABS
100.0000 ug | ORAL_TABLET | Freq: Every day | ORAL | Status: DC
  Administered 2023-10-03 – 2023-10-04 (×2): 100 ug via ORAL
  Filled 2023-10-03 (×2): qty 1

## 2023-10-03 MED ORDER — MEMANTINE HCL 10 MG PO TABS
5.0000 mg | ORAL_TABLET | Freq: Every day | ORAL | Status: DC
Start: 2023-10-03 — End: 2023-10-05
  Administered 2023-10-03 – 2023-10-04 (×2): 5 mg via ORAL
  Filled 2023-10-03 (×3): qty 1

## 2023-10-03 MED ORDER — PANTOPRAZOLE SODIUM 40 MG PO TBEC
40.0000 mg | DELAYED_RELEASE_TABLET | Freq: Every day | ORAL | Status: DC
  Administered 2023-10-03 – 2023-10-05 (×3): 40 mg via ORAL
  Filled 2023-10-03 (×3): qty 1

## 2023-10-03 MED ORDER — OXYCODONE HCL 5 MG PO TABS
2.5000 mg | ORAL_TABLET | ORAL | Status: DC | PRN
  Administered 2023-10-03 – 2023-10-04 (×5): 2.5 mg via ORAL
  Filled 2023-10-03 (×5): qty 1

## 2023-10-03 MED ORDER — ACETAMINOPHEN 500 MG PO TABS
1000.0000 mg | ORAL_TABLET | Freq: Three times a day (TID) | ORAL | Status: DC
Start: 2023-10-03 — End: 2023-10-05
  Administered 2023-10-03 – 2023-10-05 (×7): 1000 mg via ORAL
  Filled 2023-10-03 (×7): qty 2

## 2023-10-03 MED ORDER — ADULT MULTIVITAMIN W/MINERALS CH
1.0000 | ORAL_TABLET | Freq: Every day | ORAL | Status: DC
  Administered 2023-10-03 – 2023-10-05 (×3): 1 via ORAL
  Filled 2023-10-03 (×3): qty 1

## 2023-10-03 MED ORDER — ENOXAPARIN SODIUM 40 MG/0.4ML IJ SOSY
40.0000 mg | PREFILLED_SYRINGE | INTRAMUSCULAR | Status: DC
  Administered 2023-10-03 – 2023-10-04 (×2): 40 mg via SUBCUTANEOUS
  Filled 2023-10-03 (×2): qty 0.4

## 2023-10-03 MED ORDER — SODIUM CHLORIDE 0.9 % IV BOLUS
1000.0000 mL | Freq: Once | INTRAVENOUS | Status: AC
  Administered 2023-10-03: 1000 mL via INTRAVENOUS

## 2023-10-03 MED ORDER — SODIUM CHLORIDE 1 G PO TABS
1.0000 g | ORAL_TABLET | Freq: Three times a day (TID) | ORAL | Status: DC
  Administered 2023-10-03 – 2023-10-05 (×7): 1 g via ORAL
  Filled 2023-10-03 (×8): qty 1

## 2023-10-03 MED ORDER — DIPHENHYDRAMINE HCL 50 MG/ML IJ SOLN
25.0000 mg | Freq: Once | INTRAMUSCULAR | Status: AC
  Administered 2023-10-03: 25 mg via INTRAVENOUS
  Filled 2023-10-03: qty 1

## 2023-10-03 MED ORDER — KETOROLAC TROMETHAMINE 15 MG/ML IJ SOLN
15.0000 mg | Freq: Once | INTRAMUSCULAR | Status: AC
  Administered 2023-10-03: 15 mg via INTRAVENOUS
  Filled 2023-10-03: qty 1

## 2023-10-03 MED ORDER — ZOLPIDEM TARTRATE 5 MG PO TABS
5.0000 mg | ORAL_TABLET | Freq: Every evening | ORAL | Status: DC | PRN
  Administered 2023-10-03 – 2023-10-05 (×2): 5 mg via ORAL
  Filled 2023-10-03 (×2): qty 1

## 2023-10-03 NOTE — Discharge Instructions (Addendum)
 [name],  You were in the hospital because of your back pain but were found to have evidence of hyponatremia (low sodium).  This is initially managed with IV fluids, but it appears that your low sodium is related to inadequate dietary intake.  Please stick to a varied diet when you discharge.  It is okay to drink fluids, however do not drink excessively and do not drink fluids and replacement of fluid. While you were here, your TSH was mildly elevated. Since this is not the reason for your low sodium, please follow-up with your PCP to have your TSH rechecked, and if it continues to be elevated, you may need to have your dose increased slightly. I am also starting you on a blood pressure medication to keep your blood pressure better controlled.

## 2023-10-03 NOTE — ED Notes (Signed)
 Patient transported to X-ray

## 2023-10-03 NOTE — Evaluation (Signed)
 Physical Therapy Evaluation Patient Details Name: [name] MRN: [id number] DOB: 1945-09-15 Today's Date: 10/03/2023  History of Present Illness  78 y.o. female presents to [facility] hospital on 10/02/2023 with acute on chronic low back pain as well as concern for hyponatremia. MRI negative for acute findings. 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  Clinical Impression  Pt presents to PT with deficits in functional mobility, strength, power, ROM, and with reports of acute on chronic R low back pain. PT notes a trigger point near R PSIS which pt reports recreates her pain when palpated. PT provides education on light soft tissue massage from caregivers, as well as use of a lumbar roll for passive lumbar lordosis to aide in improving back pain. Passive hip extension in side lying may also be implemented in an effort to manage more chronic low back pain in the future. PT recommends discharge home when medically appropriate but will continue to follow in the acute setting.        If plan is discharge home, recommend the following: A lot of help with bathing/dressing/bathroom;Two people to help with walking and/or transfers;Assistance with cooking/housework;Direct supervision/assist for medications management;Direct supervision/assist for financial management;Assist for transportation;Help with stairs or ramp for entrance;Supervision due to cognitive status   Can travel by private vehicle        Equipment Recommendations None recommended by PT  Recommendations for Other Services       Functional Status Assessment Patient has had a recent decline in their functional status and demonstrates the ability to make significant improvements in function in a reasonable and predictable amount of time.     Precautions / Restrictions Precautions Precautions: Fall Recall  of Precautions/Restrictions: Impaired Restrictions Weight Bearing Restrictions Per Provider Order: No      Mobility  Bed Mobility Overal bed mobility: Needs Assistance Bed Mobility: Rolling Rolling: Mod assist         General bed mobility comments: pt rolls to left side 3 time and right side once during session    Transfers                        Ambulation/Gait                  Stairs            Wheelchair Mobility     Tilt Bed    Modified Rankin (Stroke Patients Only)       Balance                                             Pertinent Vitals/Pain Pain Assessment Pain Assessment: Faces Faces Pain Scale: Hurts whole lot Pain Location: R low back Pain Descriptors / Indicators: Sharp Pain Intervention(s): Monitored during session    Home Living Family/patient expects to be discharged to:: Private residence Living Arrangements: Spouse/significant other;Children Available Help at Discharge: Family Type of Home: House Home Access: Ramped entrance       Home Layout: One level Home Equipment: Wheelchair - power;Shower seat;BSC/3in1;Lift chair;Grab bars - tub/shower;Grab bars - toilet;Hand held shower head;Other (comment) (bed that can move into an egress position and tilt anteriorly, sky lift)      Prior Function Prior Level of Function : Needs assist  Mobility Comments: uses sky lift for transfers and utilizes egress feature to slide onto bedside commode with assistance. pt requires assistance for rolling in bed and repositioning ADLs Comments: sitter helps out 6hrs a day 5 days a week with ADLs     Extremity/Trunk Assessment   Upper Extremity Assessment Upper Extremity Assessment: RUE deficits/detail;LUE deficits/detail RUE Deficits / Details: pt with active shoulder flexion to ~100 degrees, passive shoulder flexion to ~110, elbow and wrist ROM WFL. strength 4-/5 through available range LUE  Deficits / Details: AROM WFL, strength grossly 4-/5    Lower Extremity Assessment Lower Extremity Assessment: RLE deficits/detail;LLE deficits/detail RLE Deficits / Details: pt with functional PROM, minimal AROM noted at hip near end of session, low tone noted throughout RLE LLE Deficits / Details: pt with functional hip PROM, poor tolerance for knee flexion with PROM limited to ~45 degrees of flexion, ankle with ~10 degree plantar flexion contracture    Cervical / Trunk Assessment Cervical / Trunk Assessment: Kyphotic  Communication   Communication Communication: No apparent difficulties    Cognition Arousal: Alert Behavior During Therapy: WFL for tasks assessed/performed   PT - Cognitive impairments: History of cognitive impairments                       PT - Cognition Comments: pt with chronic memory imairment, often without recall of the purpose of the heart monitor during session Following commands: Intact       Cueing Cueing Techniques: Verbal cues, Tactile cues, Gestural cues     General Comments General comments (skin integrity, edema, etc.): pt with trigger point noted near PSIS on R side, pt reports this spot recreates her acute pain    Exercises Other Exercises Other Exercises: PT encourages light massage to PSIS area to encourage blood flow at trigger point site. Other Exercises: PT encourages use of a lumbar roll for passive positioning in bed due to pt spending most of her time seated or lying and with a lack of lumbar lordosis Other Exercises: PT provides education on sidelying hip extension to be performed by caregivers to allow for hip ROM and possibly stretch tight hip flexors   Assessment/Plan    PT Assessment Patient needs continued PT services  PT Problem List Pain       PT Treatment Interventions Therapeutic exercise;Therapeutic activities;Functional mobility training;Patient/family education;Manual techniques    PT Goals (Current goals  can be found in the Care Plan section)  Acute Rehab PT Goals Patient Stated Goal: to reduce low back pain PT Goal Formulation: With patient/family Time For Goal Achievement: 10/17/23 Potential to Achieve Goals: Fair    Frequency Min 1X/week     Co-evaluation               AM-PAC PT "6 Clicks" Mobility  Outcome Measure Help needed turning from your back to your side while in a flat bed without using bedrails?: A Lot Help needed moving from lying on your back to sitting on the side of a flat bed without using bedrails?: Total Help needed moving to and from a bed to a chair (including a wheelchair)?: Total Help needed standing up from a chair using your arms (e.g., wheelchair or bedside chair)?: Total Help needed to walk in hospital room?: Total Help needed climbing 3-5 steps with a railing? : Total 6 Click Score: 7    End of Session   Activity Tolerance: Patient tolerated treatment well Patient left: in bed;with call bell/phone within reach;with bed  alarm set Nurse Communication: Mobility status;Need for lift equipment PT Visit Diagnosis: Pain;Muscle weakness (generalized) (M62.81) Pain - Right/Left: Right Pain - part of body:  (back)    Time: 1610-9604 PT Time Calculation (min) (ACUTE ONLY): 39 min   Charges:   PT Evaluation $PT Eval Low Complexity: 1 Low PT Treatments $Therapeutic Exercise: 8-22 mins PT General Charges $$ ACUTE PT VISIT: 1 Visit         Rexie Catena, PT, DPT Acute Rehabilitation Office [phone]   Rexie Catena 10/03/2023, 5:37 PM

## 2023-10-03 NOTE — H&P (Addendum)
 History and Physical    [name] JYN:[id number] DOB: 1945/08/14 DOA: 10/02/2023  PCP: Windell Hasty, DO  Patient coming from: home  I have personally briefly reviewed patient's old medical records in [facility]  Chief Complaint: back pain  HPI: [name] is [name] 78 y.o. female with medical history significant of post polio syndrome (with chronic LE weakness, neurogenic bladder), hypertension, HFpEF, hypothyroidism, chronic hyponatremia and multiple other medical issues here with back pain.  She notes this has been going on for Sha Burling few days.  Occurred after her husband/caregiver were shifting her around in bed.  She heard Coy Rochford pop and has had significant pain since then.  History obtained with assistance from her husband.  He notes this initial incident occurred Florena Kozma week or two ago.  She has chronic back pain, but after this everything got worse.  Pain is located on the lower right side.  She describes pain as pressure/sharp and constant.  Muscle relaxer helps, movement makes in worse.  She denies changes to urinary or bowel habits.  No numbness, chronic lower extremity weakness.  Her husband notes Salvador Bigbee HA for Israel Wunder few days, she didn't mention this to me.  No fevers, chills.  Mild cough.  No CP, no SOB, no abd pain/nausea/vomiting.  No fluid retention.  No noted weight gain, but she doesn't check weights.  She's been eating poorly, drinks Isa Hitz lot of water  3-4 smart waters Arley Garant day.  Her and her husband don't give Mariabella Nilsen detailed history of what she eats, but generally sounds like it's not Selenne Coggin lot.  She said she ate cheese toast/pickle yesterday (and I'm not sure if she ate anything else, couldn't pin her down).     At baseline, bedbound with LE weakness with post polio syndrome.  Caregiver 11-7 everyday except Wed/Sunday.    No smoking, etoh.    ED Course: Labs, imaging.  Admit for hyponatremia.  Carryover admission.  Review of Systems: As per HPI otherwise all other systems reviewed and are  negative.  Past Medical History:  Diagnosis Date   Angina pectoris (HCC) 08/02/2016   Anxiety    Arthritis    "hands, knees, back" (07/18/2013)   Asthma    CHF (congestive heart failure) (HCC)    Chronic back pain    Chronic lower back pain    Family history of anesthesia complication    Mother and Sister- N/V   GERD (gastroesophageal reflux disease)    H/O hiatal hernia    Headache(784.0)    "weekly" (07/18/2013)   Hypertension    Hypothyroidism    Insomnia    Kidney stones    Memory changes    Mental disorder    Migraine    "years ago" (07/18/2013)   Osteopenia    Post-polio syndrome    Pure hypercholesterolemia 02/07/2021   Sleep apnea    Takotsubo cardiomyopathy    Type II diabetes mellitus (HCC)    Ulcer of leg, chronic (HCC) 02/07/2021    Past Surgical History:  Procedure Laterality Date   ABDOMINAL HYSTERECTOMY     APPENDECTOMY     CARPAL TUNNEL RELEASE Bilateral    CHOLECYSTECTOMY     COLONOSCOPY     CYSTOSCOPY     IR INJECT/THERA/INC NEEDLE/CATH/PLC EPI/LUMB/SAC W/IMG  07/19/2022   IR INJECT/THERA/INC NEEDLE/CATH/PLC EPI/LUMB/SAC W/IMG  09/11/2022   IR INJECT/THERA/INC NEEDLE/CATH/PLC EPI/LUMB/SAC W/IMG  12/26/2022   KNEE ARTHROSCOPY Left    KNEE SURGERY Left    LEG SURGERY Left  polio   ORIF PATELLA Left 07/18/2013   Procedure: OPEN REDUCTION INTERNAL (ORIF) LEFT FIXATION PATELLA;  Surgeon: Lorriane Rote, MD;  Location: [facility] OR;  Service: Orthopedics;  Laterality: Left;   ORIF PATELLA FRACTURE Left 07/18/2013   TUBAL LIGATION     WISDOM TOOTH EXTRACTION     WRIST TENDON TRANSFER Right     Social History  reports that she has never smoked. She has never used smokeless tobacco. She reports that she does not drink alcohol and does not use drugs.  Allergies  Allergen Reactions   Tamsulosin  Other (See Comments)    Caused uncontrollable movements   Ciprofloxacin Rash   Carisoprodol Itching    Other Reaction(s): Unknown   Oxycodone -Acetaminophen      Other  Reaction(s): ITCHING   Penicillins Itching    Pt has tolerated rocephin  IV  Other Reaction(s): ITCHING   Sulfa Antibiotics Other (See Comments)    Unknown.  States as always told had an allergy  Other Reaction(s): Unknown   Aspirin  Itching and Other (See Comments)    Chest pain, also   Macrodantin [Nitrofurantoin Macrocrystal] Rash   Nsaids Other (See Comments)    Chest pain and stomach pain  Other Reaction(s): Unknown  Non-steroidal anti-inflammatory agent (product)   Percocet [Oxycodone -Acetaminophen ] Itching   Propoxyphene Other (See Comments)    Chest/stomach pain  Other Reaction(s): ITCHING   Propoxyphene N-Acetaminophen  Other (See Comments)    Chest/stomach pain    Family History  Problem Relation Age of Onset   Heart failure Mother    Parkinson's disease Mother    Diabetes Father    Cancer Father    Heart failure Father    Breast cancer Maternal Grandmother    Allergies Grandchild    Prior to Admission medications   Medication Sig Start Date End Date Taking? Authorizing Provider  aspirin  EC 81 MG tablet Take 81 mg by mouth every evening. Swallow whole.    [provider]  atorvastatin  (LIPITOR) 40 MG tablet Take 40 mg by mouth at bedtime.    [provider]  B Complex Vitamins (VITAMIN B COMPLEX W/B-12 PO) Take 1 tablet by mouth daily. 07/09/23   [provider]  Calcium  Carb-Cholecalciferol (CALCIUM  600+D) 600-20 MG-MCG TABS Take 1 tablet by mouth 2 (two) times daily.    [provider]  cholecalciferol (VITAMIN D3) 25 MCG (1000 UNIT) tablet Take 1,000 Units by mouth daily.    [provider]  clonazePAM  (KLONOPIN ) 0.5 MG tablet Take 0.5 mg by mouth 2 (two) times daily as needed for anxiety.    [provider]  FEROSUL 325 (65 Fe) MG tablet Take 325 mg by mouth daily. 07/09/23   [provider]  gabapentin  (NEURONTIN ) 100 MG capsule Take 100 mg by mouth 2 (two) times daily.    [provider]   levothyroxine  (SYNTHROID , LEVOTHROID) 100 MCG tablet Take 100 mcg by mouth daily before breakfast.    [provider]  loratadine  (CLARITIN ) 10 MG tablet Take 10 mg by mouth daily as needed for allergies. 12/17/19   [provider]  memantine  (NAMENDA ) 5 MG tablet Take 1 tablet (5 mg total) by mouth 2 (two) times daily. Patient taking differently: Take 5 mg by mouth daily. 06/19/23   Dohmeier, Raoul Byes, MD  metFORMIN  (GLUCOPHAGE -XR) 500 MG 24 hr tablet Take 500 mg by mouth every evening. 08/02/23   [provider]  mirabegron  ER (MYRBETRIQ ) 50 MG TB24 tablet Take 50 mg by mouth daily.  [provider]  Multiple Vitamins-Minerals (MULTIVITAMIN WITH MINERALS) tablet Take 1 tablet by mouth daily.    [provider]  pantoprazole  (PROTONIX ) 40 MG tablet Take 1 tablet by mouth daily. Take 1 tab daily 12/21/14   [provider]  telmisartan (MICARDIS) 80 MG tablet Take 80 mg by mouth daily at 12 noon. 12/14/14   [provider]  tiZANidine  (ZANAFLEX ) 4 MG tablet Take 4 mg by mouth at bedtime. PAIN    [provider]  torsemide  (DEMADEX ) 5 MG tablet Take 5 mg by mouth daily.    [provider]  vitamin E 180 MG (400 UNITS) capsule Take 400 Units by mouth daily.    [provider]  zolpidem  (AMBIEN ) 5 MG tablet Take 5 mg by mouth at bedtime as needed for sleep.    [provider]    Physical Exam: Vitals:   10/03/23 0530 10/03/23 0616 10/03/23 0636 10/03/23 0832  BP: (!) 99/53 (!) 90/55 (!) 114/47 (!) 121/47  Pulse: (!) 54 (!) 54 (!) 53 (!) 55  Resp: 10 11 17 16   Temp:  97.9 F (36.6 C) 98.4 F (36.9 C)   TempSrc:   Oral   SpO2: 99% 96% 99% 99%  Weight:      Height:        Constitutional: NAD, calm, comfortable Vitals:   10/03/23 0530 10/03/23 0616 10/03/23 0636 10/03/23 0832  BP: (!) 99/53 (!) 90/55 (!) 114/47 (!) 121/47  Pulse: (!) 54 (!) 54 (!) 53 (!) 55  Resp: 10 11 17 16   Temp:  97.9 F (36.6  C) 98.4 F (36.9 C)   TempSrc:   Oral   SpO2: 99% 96% 99% 99%  Weight:      Height:       Eyes: PERRL, lids and conjunctivae normal ENMT: Mucous membranes are moist.  Neck: normal, supple Respiratory: unlabored, CTAB Cardiovascular: RRR  Abdomen: no tenderness, no masses palpated.  Musculoskeletal: RLE smaller than LLE, chronic edema to LLE Skin: mild redness to LLE, no significant warmth or TTP Neurologic: CN 2-12 grossly intact. Intact upper extremity strength.  Bilateral LE weakness. Psychiatric: Normal judgment and insight. Alert and oriented x 3. Normal mood.   Labs on Admission: I have personally reviewed following labs and imaging studies  CBC: Recent Labs  Lab 10/02/23 2328  WBC 10.9*  HGB 13.2  HCT 36.8  MCV 88.9  PLT 420*    Basic Metabolic Panel: Recent Labs  Lab 10/03/23 0132  NA 119*  K 3.7  CL 84*  CO2 25  GLUCOSE 173*  BUN 10  CREATININE 0.41*  CALCIUM  8.5*    GFR: Estimated Creatinine Clearance: 58.9 mL/min (Leathia Farnell) (by C-G formula based on SCr of 0.41 mg/dL (L)).  Liver Function Tests: No results for input(s): "AST", "ALT", "ALKPHOS", "BILITOT", "PROT", "ALBUMIN" in the last 168 hours.  Urine analysis:    Component Value Date/Time   COLORURINE YELLOW 08/05/2023 0517   APPEARANCEUR CLEAR 08/05/2023 0517   LABSPEC 1.010 08/05/2023 0517   PHURINE 6.0 08/05/2023 0517   GLUCOSEU NEGATIVE 08/05/2023 0517   HGBUR NEGATIVE 08/05/2023 0517   BILIRUBINUR NEGATIVE 08/05/2023 0517   BILIRUBINUR negative 09/28/2020 1544   KETONESUR NEGATIVE 08/05/2023 0517   PROTEINUR NEGATIVE 08/05/2023 0517   UROBILINOGEN 0.2 09/28/2020 1544   UROBILINOGEN 0.2 09/01/2013 2145   NITRITE NEGATIVE 08/05/2023 0517   LEUKOCYTESUR MODERATE (Jacarra Bobak) 08/05/2023 0517    Radiological Exams on Admission: DG Chest Portable 1 View Result Date: 10/03/2023  CLINICAL DATA:  Difficulty breathing EXAM: PORTABLE CHEST 1 VIEW COMPARISON:  08/04/2023 FINDINGS: Cardiac shadow is within  normal limits. No significant vascular congestion is seen. The lungs are clear. No bony abnormality is noted. IMPRESSION: No active disease. Electronically Signed   By: Violeta Grey M.D.   On: 10/03/2023 02:55   CT Head Wo Contrast Result Date: 10/03/2023 CLINICAL DATA:  Increasing headaches EXAM: CT HEAD WITHOUT CONTRAST TECHNIQUE: Contiguous axial images were obtained from the base of the skull through the vertex without intravenous contrast. RADIATION DOSE REDUCTION: This exam was performed according to the departmental dose-optimization program which includes automated exposure control, adjustment of the mA and/or kV according to patient size and/or use of iterative reconstruction technique. COMPARISON:  06/03/2023 FINDINGS: Brain: No evidence of acute infarction, hemorrhage, hydrocephalus, extra-axial collection or mass lesion/mass effect. Chronic atrophic and ischemic changes are noted. Vascular: No hyperdense vessel or unexpected calcification. Skull: Normal. Negative for fracture or focal lesion. Sinuses/Orbits: No acute finding. Other: None. IMPRESSION: Chronic atrophic and ischemic changes.  No acute abnormality seen. Electronically Signed   By: Violeta Grey M.D.   On: 10/03/2023 02:13   MR LUMBAR SPINE WO CONTRAST Result Date: 10/03/2023 CLINICAL DATA:  Initial evaluation for acute low back pain. EXAM: MRI LUMBAR SPINE WITHOUT CONTRAST TECHNIQUE: Multiplanar, multisequence MR imaging of the lumbar spine was performed. No intravenous contrast was administered. COMPARISON:  Prior study from 07/17/2022 FINDINGS: Segmentation: Standard. Lowest well-formed disc space labeled the L5-S1 level. Alignment: Severe levoscoliosis, apex at L2-3. Alignment otherwise normal preservation of the normal lumbar lordosis. Vertebrae: Vertebral body height maintained without acute or chronic fracture. Bone marrow signal intensity within normal limits. No worrisome osseous lesions. Mild degenerative reactive endplate  changes present about the L1-2 interspace. No other abnormal marrow edema. Conus medullaris and cauda equina: Conus extends to the L1 level. Conus and cauda equina appear normal. Paraspinal and other soft tissues: No acute finding. Chronic fatty atrophy within the posterior paraspinous and psoas musculature. Disc levels: L1-2: Diffuse disc bulge with reactive endplate spurring. Moderate right worse than left facet hypertrophy. Associated small joint effusions. Mild narrowing of the right lateral recess. Central canal remains patent. Foramina remain patent. L2-3: Degenerative vertebral disc space narrowing with diffuse disc bulge and disc desiccation. Reactive endplate change with marginal endplate osteophytic spurring. Mild to moderate bilateral facet hypertrophy. Resultant mild narrowing of the right lateral recess. Central canal remains patent. Moderate right L2 foraminal stenosis. Left neural foramina remains patent. L3-4: Disc desiccation with mild disc bulge. Disc bulging slightly asymmetric to the right. Moderate right worse than left facet hypertrophy with small joint effusions. Resultant mild bilateral subarticular stenosis. Central canal remains patent. Mild bilateral L3 foraminal narrowing. L4-5: Disc desiccation with diffuse disc bulge. Superimposed left foraminal to extraforaminal disc protrusion (series 12, image 23). Moderate bilateral facet hypertrophy with associated small joint effusions. Resultant mild canal with moderate to severe left lateral recess stenosis. Moderate to severe left foraminal narrowing. Right neural foramen remains patent. L5-S1: Small left foraminal disc protrusion closely approximates the exiting left L5 nerve root (series 12, image 28). Mild left greater than right facet hypertrophy. No spinal stenosis. Mild left L5 foraminal narrowing. Right neural foramen remains patent. IMPRESSION: 1. No acute abnormality within the lumbar spine. 2. Severe levoscoliosis with multilevel  degenerative spondylosis and facet arthrosis as above. Resultant mild lateral recess narrowing at L2-3 and L3-4, with moderate to severe left lateral recess stenosis at L4-5. 3. Left foraminal to extraforaminal disc protrusion  at L4-5, potentially affecting the exiting left L4 nerve root. 4. Small left foraminal disc protrusion at L5-S1, potentially affecting the exiting left L5 nerve root. 5. Moderate right L2 foraminal stenosis related to disc bulge, reactive endplate change, and facet hypertrophy. Electronically Signed   By: Virgia Griffins M.D.   On: 10/03/2023 00:55    EKG: Independently reviewed. pending  Assessment/Plan Principal Problem:   Hyponatremia    Assessment and Plan:  Hyponatremia Generally appears euvolemic, but story with poor PO intake concerning for hypovolemia vs tea/toast diet.  Tramadol  which she's been taking for pain is Anderson Coppock possible contributor.  Also, could consider SIADH related to pain. Urine sodium 31 (though she's on diuretic daily) Pending urine osm, serum osm TSH, cortisol  S/p 1 L normal saline in the ED -> follow repeat BMP after this.  If improved with IVF, will continue IVF (if worsening or not improving, will fluid restrict and consider adding salt tabs).  Addendum: repeat Na after 1 L NS is 119, will fluid restrict to 1 L/day, add salt tabs. Holding torsemide  and micardis for now.  Per her husband, uses tramadol  intermittently for pain, would avoid this as it can be associated with hyponatremia.  Notably also on gabapentin /protonix  which both have adverse effect listed for hyponatremia, though I think these less likely to be causing issues than other mentioned meds, will continue for now.  Continue to trend sodium, avoid overcorrection  Lower Back Pain MRI L spine without acute abnormality (Severe levoscoliosis with multilevel degenerative spondylosis and facet arthrosis, mild recess narrowing at L2-3 and L3-4 with moderate to severe L lateral recess  senosis at L4-5.  L foraminal to extraforaminal disc protrusion at L4-5, small L foraminal discu protrusion at L5-S1 potentially affecting exiting L L5 nerve root.  Moderat R L2 foraminal stenosis related to disc buldge, reactive endplate change, facet hypertrophy). Suspect musculoskeletal pain, will follow T spine plain films Follow MRI outpatient with Dr. Rexanne Catalina at [facility] For now, scheduled APAP, voltaren  gel, prn robaxin, low dose oxy.  Aquathermia.  Bowel regimen.  PT as able to tolerate   Headache She didn't c/o this to me, but her husband noted had been the case for Lynsey Ange few days Could be related to hyponatremia Follow symptoms Head CT without acute changes   Poor PO intake Due to pain? Husband notes she's trying to lose weight as well.  Difficult to tell how much she's eating by history, they don't give great descriptions of her daily meals, just that she doesn't eat Yanin Muhlestein lot. RD c/s Additional workup as needed Attention to daily weight   Post Polio Syndrome Neurogenic Bladder  Chronic Pain Chronic lower extremity weakness Bedbound.  Has caregiver at home most days of the week.  Doesn't want to ever go to SNF. Low air loss bed Gabapentin  (taking this only nightly, not BID per husband) myrbetriq   Insomnia Anxiety Clonazepam  Ambien   Chronic LLE Swelling Noted, she notes no change - has some mild redness, not concerning for cellulitis  Hypertension Telmisartan and torsemide  on hold Reportedly taking coreg  25 mg BID as well per discussion with her husband?  She's brady here, will hold for now (looks like they stopped this during the last hospitalization?  Follow pending med rec, would discuss discontinuation at the time of discharge and ensure husband understands this is discontinued.    HFpEF No si/sx overload at this point CXR without abnormality BNP wnl Holding torsemide   Hypothyroidism Follow TSH  Cognitive Deficit namenda   GERD PPI   Skin Tear Right  buttocks Low air loss bed Frequent turns Continue to monitor   Follow final med rec, per discussion with husband, was taking coreg  - this is recommended to stop at the last hospitalization.      DVT prophylaxis: lovenox   Code Status:   full  Family Communication:  Husband over phone  Disposition Plan:   Patient is from:  home  Anticipated DC to:  home  Anticipated DC date:  5/22 or 23  Anticipated DC barriers: Pending improvement in sodium  Consults called:  none  Admission status:  obs   Severity of Illness: The appropriate patient status for this patient is OBSERVATION. Observation status is judged to be reasonable and necessary in order to provide the required intensity of service to ensure the patient's safety. The patient's presenting symptoms, physical exam findings, and initial radiographic and laboratory data in the context of their medical condition is felt to place them at decreased risk for further clinical deterioration. Furthermore, it is anticipated that the patient will be medically stable for discharge from the hospital within 2 midnights of admission.     Donnetta Gains MD [facility]  How to contact the [facility] Attending or Consulting provider 7A - 7P or covering provider during after hours 7P -7A, for this patient?   Check the care team in [facility] and look for Curly Mackowski) attending/consulting [facility] provider listed and b) the [facility] team listed Log into [email or web address] and use [place]'s universal password to access. If you do not have the password, please contact the hospital operator. Locate the [facility] provider you are looking for under [facility] and page to Evren Shankland number that you can be directly reached. If you still have difficulty reaching the provider, please page the [facility] (Director on Call) for the Hospitalists listed on amion for assistance.  10/03/2023, 9:06 AM

## 2023-10-03 NOTE — Progress Notes (Signed)
 Initial Nutrition Assessment  DOCUMENTATION CODES:   Obesity unspecified  INTERVENTION:  Multivitamin w/ minerals daily Glucerna Shake po TID, each supplement provides 220 kcal and 10 grams of protein Encourage good PO intake   NUTRITION DIAGNOSIS:   Inadequate oral intake related to social / environmental circumstances (bed bound) as evidenced by per patient/family report.  GOAL:   Patient will meet greater than or equal to 90% of their needs  MONITOR:   PO intake, Supplement acceptance, I & O's, Labs, Weight trends  REASON FOR ASSESSMENT:   Consult Assessment of nutrition requirement/status  ASSESSMENT:   78 y.o. female presented to the ED with back pain. PMH includes post-polio syndrome, HTN, T2DM, A. Fib. And wheelchair dependence. Pt admitted with hyponatremia, lower back pain, headache, and poor PO intake.   5/21 - Admitted   Met with pt and husband in room. Reports that pt typically eats 1.5-2 meals per day, unable to eat or get a meal if husband is not home due to being bed bound. Reports she has a Comptroller during the day, but reports that he does not offer or fix her any food. Typically eats an egg for breakfast and supper. Was having nausea PTA, but was constipated until today. Feels as nausea has improved. Often drinks tea, water  or diet Dr. Kathlene Paradise. Does not currently drink any oral nutrition supplements, but has in the past.  Pt unsure of weight loss PTA as she is unable to weight herself, reports that she thinks she would have lost weight. Per EMR, pt with 9% weight loss within the past 11 months, this is not significant for time frame.   RD encouraged pt to ask sitter for help at home and/or set up snacks on bedside table to be able to obtain some nutrition if husband is not home. Husband reports that pt daughter has spoke with sitter and discussed offering her lunch while there.   Nutrition Related Medications: Ferrous Sulfate , Protonix , Miralax , Sodium Chloride   tablets TID Labs: Sodium 119, Potassium 3.9, BUN 10, Creatinine 0.36, Hgb A1c 5.7 (10/26/22)  CBG: none    NUTRITION - FOCUSED PHYSICAL EXAM:  Flowsheet Row Most Recent Value  Orbital Region No depletion  Upper Arm Region No depletion  Thoracic and Lumbar Region No depletion  Buccal Region No depletion  Temple Region No depletion  Clavicle Bone Region Mild depletion  Clavicle and Acromion Bone Region Mild depletion  Scapular Bone Region Mild depletion  Dorsal Hand Moderate depletion  Patellar Region Unable to assess  Anterior Thigh Region Unable to assess  Posterior Calf Region Unable to assess  Edema (RD Assessment) None  Hair Reviewed  Eyes Reviewed  Mouth Reviewed  Skin Reviewed  Nails Unable to assess  [Nail polish]   Diet Order:   Diet Order             Diet regular Room service appropriate? Yes; Fluid consistency: Thin  Diet effective now                  EDUCATION NEEDS:  Education needs have been addressed  Skin:  Skin Assessment: Reviewed RN Assessment  Last BM:  5/21  Height:  Ht Readings from Last 1 Encounters:  10/02/23 5' (1.524 m)   Weight:  Wt Readings from Last 1 Encounters:  10/02/23 95.3 kg   Ideal Body Weight:  45.5 kg  BMI:  Body mass index is 41.01 kg/m.  Estimated Nutritional Needs:  Kcal:  1700-1900 Protein:  85-105 grams Fluid:  >/=  1.7 L   Doneta Furbish RD, LDN Clinical Dietitian

## 2023-10-03 NOTE — Plan of Care (Signed)

## 2023-10-03 NOTE — ED Notes (Signed)
 Nt clled ccmd @4 :20am

## 2023-10-03 NOTE — ED Notes (Signed)
 Critical lab called for unchanged sodium.

## 2023-10-03 NOTE — ED Notes (Signed)
 Provider made aware of sodium level

## 2023-10-04 ENCOUNTER — Ambulatory Visit (HOSPITAL_COMMUNITY)

## 2023-10-04 ENCOUNTER — Encounter (HOSPITAL_COMMUNITY): Payer: Self-pay

## 2023-10-04 DIAGNOSIS — E871 Hypo-osmolality and hyponatremia: Secondary | ICD-10-CM | POA: Diagnosis not present

## 2023-10-04 LAB — URINALYSIS, ROUTINE W REFLEX MICROSCOPIC
Bilirubin Urine: NEGATIVE
Glucose, UA: 50 mg/dL — AB
Hgb urine dipstick: NEGATIVE
Ketones, ur: NEGATIVE mg/dL
Nitrite: NEGATIVE
Protein, ur: NEGATIVE mg/dL
Specific Gravity, Urine: 1.003 — ABNORMAL LOW (ref 1.005–1.030)
pH: 6 (ref 5.0–8.0)

## 2023-10-04 LAB — BASIC METABOLIC PANEL WITH GFR
Anion gap: 12 (ref 5–15)
BUN: 6 mg/dL — ABNORMAL LOW (ref 8–23)
CO2: 20 mmol/L — ABNORMAL LOW (ref 22–32)
Calcium: 8.2 mg/dL — ABNORMAL LOW (ref 8.9–10.3)
Chloride: 90 mmol/L — ABNORMAL LOW (ref 98–111)
Creatinine, Ser: 0.49 mg/dL (ref 0.44–1.00)
GFR, Estimated: >60 mL/min (ref >60)
Glucose, Bld: 179 mg/dL — ABNORMAL HIGH (ref 70–99)
Potassium: 3.4 mmol/L — ABNORMAL LOW (ref 3.5–5.1)
Sodium: 122 mmol/L — ABNORMAL LOW (ref 135–145)

## 2023-10-04 LAB — CBC
HCT: 36.9 % (ref 36.0–46.0)
Hemoglobin: 13.2 g/dL (ref 12.0–15.0)
MCH: 32 pg (ref 26.0–34.0)
MCHC: 35.8 g/dL (ref 30.0–36.0)
MCV: 89.6 fL (ref 80.0–100.0)
Platelets: 404 10*3/uL — ABNORMAL HIGH (ref 150–400)
RBC: 4.12 MIL/uL (ref 3.87–5.11)
RDW: 13 % (ref 11.5–15.5)
WBC: 8.6 10*3/uL (ref 4.0–10.5)
nRBC: 0 % (ref 0.0–0.2)

## 2023-10-04 LAB — COMPREHENSIVE METABOLIC PANEL WITH GFR
ALT: 34 U/L (ref 0–44)
AST: 49 U/L — ABNORMAL HIGH (ref 15–41)
Albumin: 2.8 g/dL — ABNORMAL LOW (ref 3.5–5.0)
Alkaline Phosphatase: 52 U/L (ref 38–126)
Anion gap: 15 (ref 5–15)
BUN: 5 mg/dL — ABNORMAL LOW (ref 8–23)
CO2: 19 mmol/L — ABNORMAL LOW (ref 22–32)
Calcium: 8.5 mg/dL — ABNORMAL LOW (ref 8.9–10.3)
Chloride: 90 mmol/L — ABNORMAL LOW (ref 98–111)
Creatinine, Ser: 0.43 mg/dL — ABNORMAL LOW (ref 0.44–1.00)
GFR, Estimated: >60 mL/min (ref >60)
Glucose, Bld: 171 mg/dL — ABNORMAL HIGH (ref 70–99)
Potassium: 3.5 mmol/L (ref 3.5–5.1)
Sodium: 124 mmol/L — ABNORMAL LOW (ref 135–145)
Total Bilirubin: 1.1 mg/dL (ref 0.0–1.2)
Total Protein: 5.8 g/dL — ABNORMAL LOW (ref 6.5–8.1)

## 2023-10-04 LAB — OSMOLALITY, URINE: Osmolality, Ur: 76 mosm/kg — ABNORMAL LOW (ref 300–900)

## 2023-10-04 LAB — SODIUM
Sodium: 129 mmol/L — ABNORMAL LOW (ref 135–145)
Sodium: 134 mmol/L — ABNORMAL LOW (ref 135–145)

## 2023-10-04 LAB — SODIUM, URINE, RANDOM: Sodium, Ur: 15 mmol/L

## 2023-10-04 MED ORDER — LEVOTHYROXINE SODIUM 112 MCG PO TABS
112.0000 ug | ORAL_TABLET | Freq: Every day | ORAL | Status: DC
  Administered 2023-10-05: 112 ug via ORAL
  Filled 2023-10-04: qty 1

## 2023-10-04 NOTE — Plan of Care (Signed)

## 2023-10-04 NOTE — Hospital Course (Addendum)
 [name] is a 78 y.o. female with a history of postpolio syndrome, hypertension, HFpEF, hypothyroidism, chronic hyponatremia.  Patient presented secondary to back pain was found to have evidence of severe hyponatremia.  Back pain workup was negative for acute process.  Patient started on IV fluids for management of hyponatremia without significant improvement in sodium.  Patient was then transition to treatment of SIADH with fluid restriction and salt tablets with resultant improvement in sodium. Repeat urine studies suggests non-SIADH etiology. After further history, likely etiology is low-solute. Patient advised to vary her diet and to limit tea intake as substitute for solid food intake.

## 2023-10-04 NOTE — Progress Notes (Signed)
 PROGRESS NOTE    [name]  WUJ:[id number] DOB: [birth year] DOA: 10/02/2023 PCP: Windell Hasty, DO   Brief Narrative: [name] is a 78 y.o. female with a history of postpolio syndrome, hypertension, HFpEF, hypothyroidism, chronic hyponatremia.  Patient presented secondary to back pain was found to have evidence of severe hyponatremia.  Back pain workup was negative for acute process.  Patient started on IV fluids for management of hyponatremia without significant improvement in sodium.  Patient was then transition to treatment of SIADH with fluid restriction and salt tablets with resultant improvement in sodium.   Assessment and Plan:  Hyponatremia Unclear etiology, but presumed secondary to SIADH. Patient initially managed with IV fluids and persistently low sodium. Patient then transitioned to fluid restriction and salt tablets for management with subsequent improvement. TSH is elevated which may be contributing. -Continue fluid restriction and salt tablets  Lower back pain Secondary to movement by aids. MRI lumbar spine and thoracic x-ray imaging negative for acute fracture or other pathology to explain symptoms. Likely musculoskeletal.  Headache CT head unremarkable for acute process. Improved.  Postpolio syndrome Stable. Patient has caregiver help at home.  Neurogenic bladder -Continue Myrbetriq   Insomnia -Continue Ambien   Anxiety -Continue clonazepam   Chronic bilateral lower extremity swelling Noted. Stable.  Primary hypertension Patient on Coreg  as an outpatient which was held on admissions secondary to bradycardia. -Discontinue Coreg  on discharge  Chronic HFrEF Stable. Torsemide  held secondary to hyponatremia  Hypothyroidism Patient is managed on Synthroid  100 mcg as an outpatient and is taking as prescribed. TSH elevated at 11.395. -Increase to Synthroid  112 mcg daily  Cognitive impairment -Continue Namenda   GERD -Continue Protonix   Chronic  pain -Continue oxycodone  as needed*  Morbid obesity Estimated body mass index is 41.12 kg/m as calculated from the following:   Height as of this encounter: 5' (1.524 m).   Weight as of this encounter: 95.5 kg.    DVT prophylaxis: Lovenox  Code Status:   Code Status: Full Code Family Communication: Husband at bedside Disposition Plan: Discharge home pending improvement of hyponatremia   Consultants:  None  Procedures:  None  Antimicrobials: None    Subjective: Patient reports no issues this morning. Not very hungry.  Objective: BP (!) 158/68 (BP Location: Right Arm)   Pulse 69   Temp 97.9 F (36.6 C)   Resp 18   Ht 5' (1.524 m)   Wt 95.5 kg   SpO2 97%   BMI 41.12 kg/m   Examination:  General exam: Appears calm and comfortable Respiratory system: Clear to auscultation. Respiratory effort normal. Cardiovascular system: S1 & S2 heard, RRR. No murmurs, rubs, gallops or clicks. Gastrointestinal system: Abdomen is nondistended, soft and nontender. Normal bowel sounds heard. Central nervous system: Alert and oriented. No focal neurological deficits. Psychiatry: Judgement and insight appear normal. Mood & affect appropriate.    Data Reviewed: I have personally reviewed following labs and imaging studies  CBC Lab Results  Component Value Date   WBC 8.6 10/04/2023   RBC 4.12 10/04/2023   HGB 13.2 10/04/2023   HCT 36.9 10/04/2023   MCV 89.6 10/04/2023   MCH 32.0 10/04/2023   PLT 404 (H) 10/04/2023   MCHC 35.8 10/04/2023   RDW 13.0 10/04/2023   LYMPHSABS 3.8 08/05/2023   MONOABS 0.8 08/05/2023   EOSABS 0.5 08/05/2023   BASOSABS 0.1 08/05/2023     Last metabolic panel Lab Results  Component Value Date   NA 124 (L) 10/04/2023   K 3.5 10/04/2023  CL 90 (L) 10/04/2023   CO2 19 (L) 10/04/2023   BUN 5 (L) 10/04/2023   CREATININE 0.43 (L) 10/04/2023   GLUCOSE 171 (H) 10/04/2023   GFRNONAA >60 10/04/2023   GFRAA 100 09/05/2019   CALCIUM  8.5 (L)  10/04/2023   PHOS 3.4 01/17/2023   PROT 5.8 (L) 10/04/2023   ALBUMIN 2.8 (L) 10/04/2023   BILITOT 1.1 10/04/2023   ALKPHOS 52 10/04/2023   AST 49 (H) 10/04/2023   ALT 34 10/04/2023   ANIONGAP 15 10/04/2023    GFR: Estimated Creatinine Clearance: 59 mL/min (A) (by C-G formula based on SCr of 0.43 mg/dL (L)).  No results found for this or any previous visit (from the past 240 hours).    Radiology Studies: DG Thoracic Spine 2 View Result Date: 10/03/2023 CLINICAL DATA:  Back pain. EXAM: THORACIC SPINE 2 VIEWS COMPARISON:  Chest radiograph dated 10/03/2023. FINDINGS: No acute fracture or subluxation of the thoracic spine. The bones are osteopenic. There is mild scoliosis. The soft tissues are unremarkable. IMPRESSION: 1. No acute findings. 2. Osteopenia. Electronically Signed   By: Angus Bark M.D.   On: 10/03/2023 10:29   DG Chest Portable 1 View Result Date: 10/03/2023 CLINICAL DATA:  Difficulty breathing EXAM: PORTABLE CHEST 1 VIEW COMPARISON:  08/04/2023 FINDINGS: Cardiac shadow is within normal limits. No significant vascular congestion is seen. The lungs are clear. No bony abnormality is noted. IMPRESSION: No active disease. Electronically Signed   By: Violeta Grey M.D.   On: 10/03/2023 02:55   CT Head Wo Contrast Result Date: 10/03/2023 CLINICAL DATA:  Increasing headaches EXAM: CT HEAD WITHOUT CONTRAST TECHNIQUE: Contiguous axial images were obtained from the base of the skull through the vertex without intravenous contrast. RADIATION DOSE REDUCTION: This exam was performed according to the departmental dose-optimization program which includes automated exposure control, adjustment of the mA and/or kV according to patient size and/or use of iterative reconstruction technique. COMPARISON:  06/03/2023 FINDINGS: Brain: No evidence of acute infarction, hemorrhage, hydrocephalus, extra-axial collection or mass lesion/mass effect. Chronic atrophic and ischemic changes are noted. Vascular:  No hyperdense vessel or unexpected calcification. Skull: Normal. Negative for fracture or focal lesion. Sinuses/Orbits: No acute finding. Other: None. IMPRESSION: Chronic atrophic and ischemic changes.  No acute abnormality seen. Electronically Signed   By: Violeta Grey M.D.   On: 10/03/2023 02:13   MR LUMBAR SPINE WO CONTRAST Result Date: 10/03/2023 CLINICAL DATA:  Initial evaluation for acute low back pain. EXAM: MRI LUMBAR SPINE WITHOUT CONTRAST TECHNIQUE: Multiplanar, multisequence MR imaging of the lumbar spine was performed. No intravenous contrast was administered. COMPARISON:  Prior study from 07/17/2022 FINDINGS: Segmentation: Standard. Lowest well-formed disc space labeled the L5-S1 level. Alignment: Severe levoscoliosis, apex at L2-3. Alignment otherwise normal preservation of the normal lumbar lordosis. Vertebrae: Vertebral body height maintained without acute or chronic fracture. Bone marrow signal intensity within normal limits. No worrisome osseous lesions. Mild degenerative reactive endplate changes present about the L1-2 interspace. No other abnormal marrow edema. Conus medullaris and cauda equina: Conus extends to the L1 level. Conus and cauda equina appear normal. Paraspinal and other soft tissues: No acute finding. Chronic fatty atrophy within the posterior paraspinous and psoas musculature. Disc levels: L1-2: Diffuse disc bulge with reactive endplate spurring. Moderate right worse than left facet hypertrophy. Associated small joint effusions. Mild narrowing of the right lateral recess. Central canal remains patent. Foramina remain patent. L2-3: Degenerative vertebral disc space narrowing with diffuse disc bulge and disc desiccation. Reactive endplate change with marginal  endplate osteophytic spurring. Mild to moderate bilateral facet hypertrophy. Resultant mild narrowing of the right lateral recess. Central canal remains patent. Moderate right L2 foraminal stenosis. Left neural foramina  remains patent. L3-4: Disc desiccation with mild disc bulge. Disc bulging slightly asymmetric to the right. Moderate right worse than left facet hypertrophy with small joint effusions. Resultant mild bilateral subarticular stenosis. Central canal remains patent. Mild bilateral L3 foraminal narrowing. L4-5: Disc desiccation with diffuse disc bulge. Superimposed left foraminal to extraforaminal disc protrusion (series 12, image 23). Moderate bilateral facet hypertrophy with associated small joint effusions. Resultant mild canal with moderate to severe left lateral recess stenosis. Moderate to severe left foraminal narrowing. Right neural foramen remains patent. L5-S1: Small left foraminal disc protrusion closely approximates the exiting left L5 nerve root (series 12, image 28). Mild left greater than right facet hypertrophy. No spinal stenosis. Mild left L5 foraminal narrowing. Right neural foramen remains patent. IMPRESSION: 1. No acute abnormality within the lumbar spine. 2. Severe levoscoliosis with multilevel degenerative spondylosis and facet arthrosis as above. Resultant mild lateral recess narrowing at L2-3 and L3-4, with moderate to severe left lateral recess stenosis at L4-5. 3. Left foraminal to extraforaminal disc protrusion at L4-5, potentially affecting the exiting left L4 nerve root. 4. Small left foraminal disc protrusion at L5-S1, potentially affecting the exiting left L5 nerve root. 5. Moderate right L2 foraminal stenosis related to disc bulge, reactive endplate change, and facet hypertrophy. Electronically Signed   By: Virgia Griffins M.D.   On: 10/03/2023 00:55      LOS: 1 day    Aneita Keens, MD [facility] 10/04/2023, 8:54 AM   If 7PM-7AM, please contact night-coverage [email or web address]

## 2023-10-04 NOTE — Plan of Care (Signed)

## 2023-10-05 ENCOUNTER — Ambulatory Visit: Attending: Emergency Medicine | Admitting: Emergency Medicine

## 2023-10-05 ENCOUNTER — Encounter: Payer: Self-pay | Admitting: Emergency Medicine

## 2023-10-05 VITALS — BP 130/62 | HR 70 | Ht 60.0 in | Wt 190.0 lb

## 2023-10-05 DIAGNOSIS — I10 Essential (primary) hypertension: Secondary | ICD-10-CM

## 2023-10-05 DIAGNOSIS — R001 Bradycardia, unspecified: Secondary | ICD-10-CM

## 2023-10-05 DIAGNOSIS — I2510 Atherosclerotic heart disease of native coronary artery without angina pectoris: Secondary | ICD-10-CM

## 2023-10-05 DIAGNOSIS — I480 Paroxysmal atrial fibrillation: Secondary | ICD-10-CM

## 2023-10-05 DIAGNOSIS — E871 Hypo-osmolality and hyponatremia: Secondary | ICD-10-CM

## 2023-10-05 DIAGNOSIS — I5032 Chronic diastolic (congestive) heart failure: Secondary | ICD-10-CM | POA: Diagnosis not present

## 2023-10-05 DIAGNOSIS — R600 Localized edema: Secondary | ICD-10-CM

## 2023-10-05 DIAGNOSIS — I5181 Takotsubo syndrome: Secondary | ICD-10-CM | POA: Diagnosis not present

## 2023-10-05 MED ORDER — TIZANIDINE HCL 4 MG PO TABS
4.0000 mg | ORAL_TABLET | Freq: Four times a day (QID) | ORAL | Status: DC | PRN
  Administered 2023-10-05: 4 mg via ORAL
  Filled 2023-10-05: qty 1

## 2023-10-05 MED ORDER — DIPHENHYDRAMINE-ZINC ACETATE 2-0.1 % EX CREA
TOPICAL_CREAM | Freq: Two times a day (BID) | CUTANEOUS | Status: DC | PRN
  Filled 2023-10-05: qty 28

## 2023-10-05 MED ORDER — AMLODIPINE BESYLATE 5 MG PO TABS: 5.0000 mg | ORAL_TABLET | Freq: Every day | ORAL | 2 refills | Status: AC

## 2023-10-05 MED ORDER — MUSCLE RUB 10-15 % EX CREA
TOPICAL_CREAM | CUTANEOUS | Status: DC | PRN
  Administered 2023-10-05: 1 via TOPICAL
  Filled 2023-10-05: qty 85

## 2023-10-05 NOTE — Care Management Important Message (Signed)
 Important Message  Patient Details  Name: [name] MRN: [id number] Date of Birth: Apr 25, 1946   Important Message Given:  Yes - Medicare IM     [name] 10/05/2023, 3:39 PM

## 2023-10-05 NOTE — Care Management Important Message (Signed)
 Important Message  Patient Details  Name: [name] MRN: [id number] Date of Birth: [birth year]   Important Message Given:  Yes - Medicare IM     [name] 10/05/2023, 3:38 PM

## 2023-10-05 NOTE — Discharge Summary (Signed)
 Physician Discharge Summary   Patient: [name] MRN: [id number] DOB: [birth year]  Admit date:     10/02/2023  Discharge date: 10/05/23  Discharge Physician: Aneita Keens, MD   PCP: Windell Hasty, DO   Recommendations at discharge:  PCP visit for hospital follow-up Improved dietary intake  Discharge Diagnoses: Principal Problem:   Hyponatremia  Resolved Problems:   * No resolved hospital problems. *  Hospital Course: [name] is a 78 y.o. female with a history of postpolio syndrome, hypertension, HFpEF, hypothyroidism, chronic hyponatremia.  Patient presented secondary to back pain was found to have evidence of severe hyponatremia.  Back pain workup was negative for acute process.  Patient started on IV fluids for management of hyponatremia without significant improvement in sodium.  Patient was then transition to treatment of SIADH with fluid restriction and salt tablets with resultant improvement in sodium. Repeat urine studies suggests non-SIADH etiology. After further history, likely etiology is low-solute. Patient advised to vary her diet and to limit tea intake as substitute for solid food intake.  Assessment and Plan:  Hyponatremia Unclear etiology, but presumed secondary to SIADH. Sodium of 119 on admission. Patient initially managed with IV fluids and persistently low sodium. Patient then transitioned to fluid restriction and salt tablets for management with subsequent improvement. TSH is elevated which was considered to be contributing. Sodium improved to 134 prior to discharge. After further history, patient appears to have a low solute diet, in addition to high tea intake. Discussed dietary recommendations with patient and husband at bedside.   Lower back pain Secondary to movement by aids. MRI lumbar spine and thoracic x-ray imaging negative for acute fracture or other pathology to explain symptoms. Likely musculoskeletal.   Headache CT head unremarkable for acute  process. Improved.   Postpolio syndrome Stable. Patient has caregiver help at home.   Neurogenic bladder Continue Myrbetriq .   Insomnia Continue Ambien .   Anxiety Continue clonazepam .   Chronic bilateral lower extremity swelling Noted. Stable.   Primary hypertension Patient on Coreg  as an outpatient which was held on admissions secondary to bradycardia. Discharge on amlodipine  5 mg daily.   Chronic HFrEF Stable. Torsemide  held secondary to hyponatremia   Hypothyroidism Patient is managed on Synthroid  100 mcg as an outpatient and is taking as prescribed. TSH elevated at 11.395. Synthroid  increased while inpatient, however since hyponatremia is likely not caused by hypothyroidism, will continue outpatient dose. TSH has fluctuated over the year on current dose. Recommend repeat TSH as an outpatient when patient is stable.   Cognitive impairment Continue Namenda    GERD Continue Protonix    Chronic pain Continue oxycodone  as needed*   Morbid obesity Estimated body mass index is 37.8 kg/m as calculated from the following:   Height as of this encounter: 5' (1.524 m).   Weight as of this encounter: 87.8 kg.   Consultants: None Procedures performed: None  Disposition: Home Diet recommendation: Regular diet   DISCHARGE MEDICATION: Allergies as of 10/05/2023       Reactions   Tamsulosin  Other (See Comments)   Caused uncontrollable movements   Ciprofloxacin Rash   Carisoprodol Itching   Oxycodone -acetaminophen  Itching   Penicillins Itching   Pt has tolerated rocephin  IV   Sulfa Antibiotics Other (See Comments)   Unknown.  States as always told had an allergy   Aspirin  Itching, Other (See Comments)   Chest pain, also   Macrodantin [nitrofurantoin Macrocrystal] Rash   Nsaids Other (See Comments)   Chest pain and stomach pain Non-steroidal  anti-inflammatory agent (product)   Percocet [oxycodone -acetaminophen ] Itching   Propoxyphene Itching   Chest/stomach pain    Propoxyphene N-acetaminophen  Other (See Comments)   Chest/stomach pain        Medication List     TAKE these medications    aspirin  EC 81 MG tablet Take 81 mg by mouth every evening. Swallow whole.   atorvastatin  40 MG tablet Commonly known as: LIPITOR Take 40 mg by mouth at bedtime.   Calcium  600+D 600-20 MG-MCG Tabs Generic drug: Calcium  Carb-Cholecalciferol Take 1 tablet by mouth 2 (two) times daily.   cholecalciferol 25 MCG (1000 UNIT) tablet Commonly known as: VITAMIN D3 Take 1,000 Units by mouth daily.   clonazePAM  0.5 MG tablet Commonly known as: KLONOPIN  Take 0.5 mg by mouth 2 (two) times daily as needed for anxiety.   FeroSul 325 (65 Fe) MG tablet Generic drug: ferrous sulfate  Take 325 mg by mouth daily.   gabapentin  100 MG capsule Commonly known as: NEURONTIN  Take 100 mg by mouth 2 (two) times daily.   levothyroxine  100 MCG tablet Commonly known as: SYNTHROID  Take 100 mcg by mouth daily before breakfast.   loratadine  10 MG tablet Commonly known as: CLARITIN  Take 10 mg by mouth daily as needed for allergies.   memantine  5 MG tablet Commonly known as: Namenda  Take 1 tablet (5 mg total) by mouth 2 (two) times daily. What changed: when to take this   metFORMIN  500 MG 24 hr tablet Commonly known as: GLUCOPHAGE -XR Take 500 mg by mouth every evening.   mirabegron  ER 50 MG Tb24 tablet Commonly known as: MYRBETRIQ  Take 50 mg by mouth daily.   multivitamin with minerals tablet Take 1 tablet by mouth daily.   pantoprazole  40 MG tablet Commonly known as: PROTONIX  Take 1 tablet by mouth daily. Take 1 tab daily   telmisartan 80 MG tablet Commonly known as: MICARDIS Take 80 mg by mouth daily at 12 noon.   tiZANidine  4 MG tablet Commonly known as: ZANAFLEX  Take 4 mg by mouth every 6 (six) hours as needed for muscle spasms. PAIN   torsemide  5 MG tablet Commonly known as: DEMADEX  Take 5 mg by mouth daily.   VITAMIN B COMPLEX W/B-12 PO Take 1  tablet by mouth daily.   vitamin E 180 MG (400 UNITS) capsule Take 400 Units by mouth daily.   zolpidem  5 MG tablet Commonly known as: AMBIEN  Take 5 mg by mouth at bedtime as needed for sleep.        Follow-up Information     Windell Hasty, DO. Schedule an appointment as soon as possible for a visit in 1 week(s).   Specialty: Internal Medicine Why: For hospital follow-up Contact information: [address] [place] [address] [phone]                Discharge Exam: BP (!) 152/57   Pulse 75   Temp 97.8 F (36.6 C) (Oral)   Resp 16   Ht 5' (1.524 m)   Wt 87.8 kg   SpO2 96%   BMI 37.80 kg/m   General exam: Appears calm and comfortable Respiratory system: Clear to auscultation. Respiratory effort normal. Cardiovascular system: S1 & S2 heard, RRR. No murmurs, rubs, gallops or clicks. Gastrointestinal system: Abdomen is nondistended, soft and nontender. Normal bowel sounds heard. Central nervous system: Alert and oriented. Musculoskeletal: No edema. No calf tenderness Skin: No cyanosis. No rashes Psychiatry: Judgement and insight appear normal. Mood & affect appropriate.   Condition at discharge: stable  The results of significant diagnostics from this hospitalization (including imaging, microbiology, ancillary and laboratory) are listed below for reference.   Imaging Studies: DG Thoracic Spine 2 View Result Date: 10/03/2023 CLINICAL DATA:  Back pain. EXAM: THORACIC SPINE 2 VIEWS COMPARISON:  Chest radiograph dated 10/03/2023. FINDINGS: No acute fracture or subluxation of the thoracic spine. The bones are osteopenic. There is mild scoliosis. The soft tissues are unremarkable. IMPRESSION: 1. No acute findings. 2. Osteopenia. Electronically Signed   By: Angus Bark M.D.   On: 10/03/2023 10:29   DG Chest Portable 1 View Result Date: 10/03/2023 CLINICAL DATA:  Difficulty breathing EXAM: PORTABLE CHEST 1 VIEW COMPARISON:  08/04/2023 FINDINGS: Cardiac shadow  is within normal limits. No significant vascular congestion is seen. The lungs are clear. No bony abnormality is noted. IMPRESSION: No active disease. Electronically Signed   By: Violeta Grey M.D.   On: 10/03/2023 02:55   CT Head Wo Contrast Result Date: 10/03/2023 CLINICAL DATA:  Increasing headaches EXAM: CT HEAD WITHOUT CONTRAST TECHNIQUE: Contiguous axial images were obtained from the base of the skull through the vertex without intravenous contrast. RADIATION DOSE REDUCTION: This exam was performed according to the departmental dose-optimization program which includes automated exposure control, adjustment of the mA and/or kV according to patient size and/or use of iterative reconstruction technique. COMPARISON:  06/03/2023 FINDINGS: Brain: No evidence of acute infarction, hemorrhage, hydrocephalus, extra-axial collection or mass lesion/mass effect. Chronic atrophic and ischemic changes are noted. Vascular: No hyperdense vessel or unexpected calcification. Skull: Normal. Negative for fracture or focal lesion. Sinuses/Orbits: No acute finding. Other: None. IMPRESSION: Chronic atrophic and ischemic changes.  No acute abnormality seen. Electronically Signed   By: Violeta Grey M.D.   On: 10/03/2023 02:13   MR LUMBAR SPINE WO CONTRAST Result Date: 10/03/2023 CLINICAL DATA:  Initial evaluation for acute low back pain. EXAM: MRI LUMBAR SPINE WITHOUT CONTRAST TECHNIQUE: Multiplanar, multisequence MR imaging of the lumbar spine was performed. No intravenous contrast was administered. COMPARISON:  Prior study from 07/17/2022 FINDINGS: Segmentation: Standard. Lowest well-formed disc space labeled the L5-S1 level. Alignment: Severe levoscoliosis, apex at L2-3. Alignment otherwise normal preservation of the normal lumbar lordosis. Vertebrae: Vertebral body height maintained without acute or chronic fracture. Bone marrow signal intensity within normal limits. No worrisome osseous lesions. Mild degenerative reactive  endplate changes present about the L1-2 interspace. No other abnormal marrow edema. Conus medullaris and cauda equina: Conus extends to the L1 level. Conus and cauda equina appear normal. Paraspinal and other soft tissues: No acute finding. Chronic fatty atrophy within the posterior paraspinous and psoas musculature. Disc levels: L1-2: Diffuse disc bulge with reactive endplate spurring. Moderate right worse than left facet hypertrophy. Associated small joint effusions. Mild narrowing of the right lateral recess. Central canal remains patent. Foramina remain patent. L2-3: Degenerative vertebral disc space narrowing with diffuse disc bulge and disc desiccation. Reactive endplate change with marginal endplate osteophytic spurring. Mild to moderate bilateral facet hypertrophy. Resultant mild narrowing of the right lateral recess. Central canal remains patent. Moderate right L2 foraminal stenosis. Left neural foramina remains patent. L3-4: Disc desiccation with mild disc bulge. Disc bulging slightly asymmetric to the right. Moderate right worse than left facet hypertrophy with small joint effusions. Resultant mild bilateral subarticular stenosis. Central canal remains patent. Mild bilateral L3 foraminal narrowing. L4-5: Disc desiccation with diffuse disc bulge. Superimposed left foraminal to extraforaminal disc protrusion (series 12, image 23). Moderate bilateral facet hypertrophy with associated small joint effusions. Resultant mild canal with moderate to severe  left lateral recess stenosis. Moderate to severe left foraminal narrowing. Right neural foramen remains patent. L5-S1: Small left foraminal disc protrusion closely approximates the exiting left L5 nerve root (series 12, image 28). Mild left greater than right facet hypertrophy. No spinal stenosis. Mild left L5 foraminal narrowing. Right neural foramen remains patent. IMPRESSION: 1. No acute abnormality within the lumbar spine. 2. Severe levoscoliosis with  multilevel degenerative spondylosis and facet arthrosis as above. Resultant mild lateral recess narrowing at L2-3 and L3-4, with moderate to severe left lateral recess stenosis at L4-5. 3. Left foraminal to extraforaminal disc protrusion at L4-5, potentially affecting the exiting left L4 nerve root. 4. Small left foraminal disc protrusion at L5-S1, potentially affecting the exiting left L5 nerve root. 5. Moderate right L2 foraminal stenosis related to disc bulge, reactive endplate change, and facet hypertrophy. Electronically Signed   By: Virgia Griffins M.D.   On: 10/03/2023 00:55   MM 3D SCREENING MAMMOGRAM BILATERAL BREAST Result Date: 09/24/2023 CLINICAL DATA:  Screening. EXAM: DIGITAL SCREENING BILATERAL MAMMOGRAM WITH TOMOSYNTHESIS AND CAD TECHNIQUE: Bilateral screening digital craniocaudal and mediolateral oblique mammograms were obtained. Bilateral screening digital breast tomosynthesis was performed. The images were evaluated with computer-aided detection. COMPARISON:  Previous exam(s). ACR Breast Density Category b: There are scattered areas of fibroglandular density. FINDINGS: Technologist indicates patient is confined to a wheelchair which limited visualization of the posterior right breast tissue. Bilateral vascular calcifications. There are no findings suspicious for malignancy. IMPRESSION: No mammographic evidence of malignancy. A result letter of this screening mammogram will be mailed directly to the patient. RECOMMENDATION: Screening mammogram in one year. (Code:SM-B-01Y) BI-RADS CATEGORY  2: Benign. Electronically Signed   By: Dru Georges M.D.   On: 09/24/2023 13:33    Microbiology: Results for orders placed or performed during the hospital encounter of 08/04/23  Blood Culture (routine x 2)     Status: None   Collection Time: 08/05/23 12:13 AM   Specimen: BLOOD RIGHT HAND  Result Value Ref Range Status   Specimen Description BLOOD RIGHT HAND  Final   Special Requests   Final     BOTTLES DRAWN AEROBIC ONLY Blood Culture adequate volume   Culture   Final    NO GROWTH 5 DAYS Performed at [facility], [address] [place], [place] [address]    Report Status 08/10/2023 FINAL  Final  Blood Culture (routine x 2)     Status: None   Collection Time: 08/05/23 12:13 AM   Specimen: BLOOD LEFT HAND  Result Value Ref Range Status   Specimen Description BLOOD LEFT HAND  Final   Special Requests   Final    BOTTLES DRAWN AEROBIC ONLY Blood Culture adequate volume   Culture   Final    NO GROWTH 5 DAYS Performed at [facility], [address] [place], [place] [address]    Report Status 08/10/2023 FINAL  Final  Resp panel by RT-PCR (RSV, Flu A&B, Covid) Anterior Nasal Swab     Status: None   Collection Time: 08/05/23  1:49 AM   Specimen: Anterior Nasal Swab  Result Value Ref Range Status   SARS Coronavirus 2 by RT PCR NEGATIVE NEGATIVE Final   Influenza A by PCR NEGATIVE NEGATIVE Final   Influenza B by PCR NEGATIVE NEGATIVE Final    Comment: (NOTE) The Xpert Xpress SARS-CoV-2/FLU/RSV plus assay is intended as an aid in the diagnosis of influenza from Nasopharyngeal swab specimens and should not be used as a sole basis  for treatment. Nasal washings and aspirates are unacceptable for Xpert Xpress SARS-CoV-2/FLU/RSV testing.  Fact Sheet for Patients: BloggerCourse.com  Fact Sheet for Healthcare Providers: SeriousBroker.it  This test is not yet approved or cleared by the [place]  FDA and has been authorized for detection and/or diagnosis of SARS-CoV-2 by FDA under an Emergency Use Authorization (EUA). This EUA will remain in effect (meaning this test can be used) for the duration of the COVID-19 declaration under Section 564(b)(1) of the Act, 21 U.S.C. section 360bbb-3(b)(1), unless the authorization is terminated or revoked.     Resp Syncytial Virus by PCR NEGATIVE NEGATIVE Final    Comment:  (NOTE) Fact Sheet for Patients: BloggerCourse.com  Fact Sheet for Healthcare Providers: SeriousBroker.it  This test is not yet approved or cleared by the [place]  FDA and has been authorized for detection and/or diagnosis of SARS-CoV-2 by FDA under an Emergency Use Authorization (EUA). This EUA will remain in effect (meaning this test can be used) for the duration of the COVID-19 declaration under Section 564(b)(1) of the Act, 21 U.S.C. section 360bbb-3(b)(1), unless the authorization is terminated or revoked.  Performed at [facility], [address] [place], [place] [address]   MRSA Next Gen by PCR, Nasal     Status: None   Collection Time: 08/05/23  5:15 AM   Specimen: Urine, Clean Catch; Nasal Swab  Result Value Ref Range Status   MRSA by PCR Next Gen NOT DETECTED NOT DETECTED Final    Comment: (NOTE) The GeneXpert MRSA Assay (FDA approved for NASAL specimens only), is one component of a comprehensive MRSA colonization surveillance program. It is not intended to diagnose MRSA infection nor to guide or monitor treatment for MRSA infections. Test performance is not FDA approved in patients less than 4 years old. Performed at [facility], [address] [place], [place] [address]     Labs: CBC: Recent Labs  Lab 10/02/23 2328 10/03/23 1517 10/04/23 0617  WBC 10.9* 10.0 8.6  HGB 13.2 14.7 13.2  HCT 36.8 41.6 36.9  MCV 88.9 89.7 89.6  PLT 420* 385 404*   Basic Metabolic Panel: Recent Labs  Lab 10/03/23 1517 10/03/23 2008 10/03/23 2201 10/04/23 0129 10/04/23 0617 10/04/23 1222 10/04/23 2040  NA 122* 122* 122* 122* 124* 129* 134*  K 4.1 3.4* 3.3* 3.4* 3.5  --   --   CL 86* 85* 88* 90* 90*  --   --   CO2 21* 22 21* 20* 19*  --   --   GLUCOSE 133* 192* 154* 179* 171*  --   --   BUN 10 8 7* 6* 5*  --   --   CREATININE 0.59 0.60 0.58 0.49 0.43*  --   --   CALCIUM  8.9 8.6* 8.6* 8.2* 8.5*  --    --    Liver Function Tests: Recent Labs  Lab 10/03/23 0825 10/04/23 0617  AST 43* 49*  ALT 37 34  ALKPHOS 64 52  BILITOT 1.0 1.1  PROT 6.5 5.8*  ALBUMIN 3.0* 2.8*    Discharge time spent: 35 minutes.  Signed: Aneita Keens, MD [facility] 10/05/2023

## 2023-10-05 NOTE — Patient Instructions (Signed)
 Medication Instructions:  NO CHANGES    Lab Work: NONE   Testing/Procedures: NONE  Follow-Up: At [facility], you and your health needs are our priority.  As part of our continuing mission to provide you with exceptional heart care, our providers are all part of one team.  This team includes your primary Cardiologist (physician) and Advanced Practice Providers or APPs (Physician Assistants and Nurse Practitioners) who all work together to provide you with the care you need, when you need it.  Your next appointment:   3 MONTHS.  Provider:   Maudine Sos, MD

## 2023-10-05 NOTE — Progress Notes (Signed)
                              [facility] Sudil was notified for clarification of cardiac monitoring that had been discontinued 16 hrs ago at the time of notification. New order to place on cardiac monitoring.                               [facility]

## 2023-10-05 NOTE — TOC Transition Note (Signed)
 Transition of Care [facility]) - Discharge Note   Patient Details  Name: [name] MRN: [id number] Date of Birth: 1946-05-12  Transition of [facility] CM/SW Contact:  Jonathan Neighbor, RN Phone Number: 10/05/2023, 8:47 AM   Clinical Narrative:     Pt is discharging home with self care. No needs per TOC.   Final next level of care: Home/Self Care Barriers to Discharge: No Barriers Identified   Patient Goals and CMS Choice            Discharge Placement                       Discharge Plan and Services Additional resources added to the After Visit Summary for                                       Social Drivers of Health (SDOH) Interventions SDOH Screenings   Food Insecurity: No Food Insecurity (10/03/2023)  Housing: Low Risk  (10/03/2023)  Transportation Needs: No Transportation Needs (10/03/2023)  Utilities: Not At Risk (10/03/2023)  Depression (PHQ2-9): Low Risk  (05/30/2021)  Social Connections: Socially Integrated (10/03/2023)  Tobacco Use: Low Risk  (10/03/2023)     Readmission Risk Interventions    01/11/2023   12:56 PM 10/26/2022   12:31 PM  Readmission Risk Prevention Plan  Transportation Screening Complete Complete  PCP or Specialist Appt within 5-7 Days  Complete  PCP or Specialist Appt within 3-5 Days Complete   Home Care Screening  Complete  Medication Review (RN CM)  Complete  HRI or Home Care Consult Complete   Social Work Consult for Recovery Care Planning/Counseling Complete   Palliative Care Screening Not Applicable   Medication Review Oceanographer) Complete

## 2023-10-05 NOTE — Progress Notes (Signed)
 Cardiology Office Note:    Date:  10/05/2023  ID:  [name], [name] [birth year], MRN [id number] PCP: Windell Hasty, DO  [facility] Providers Cardiologist:  Maudine Sos, MD       Patient Profile:       Chief Complaint: Hospital follow-up History of Present Illness:  [name] is a 78 y.o. female with visit-pertinent history of hypertension, mild nonobstructive CAD, Takotsubo syndrome, type 2 diabetes, postpolio syndrome, hypothyroidism  She was first seen by cardiology in 12/2014 for hypertension and atypical chest pain.  At that appointment she had some lower extremity edema, furosemide  3 times weekly was added to her regimen.  She had also reported atypical chest pain and Lexiscan  Myoview  was completed on 12/2014 that revealed LVEF 71% with no evidence of ischemia.  She was then seen in the ED on 07/2016 with chest tightness and poorly controlled blood pressure.  She was instructed to follow with cardiology as outpatient.  Lexiscan  Myoview  was ordered on 08/2017 that revealed LVEF 67% with concern for basal to mid inferior ischemia.  Subsequently she had cardiac CTA that revealed 30% LAD stenosis and no obstructive disease.  She has had a history of labile blood pressures in the past and struggled with hyponatremia.  She was referred for renal artery Dopplers that revealed normal renal artery flow but 70 to 99% SMA stenosis.  Overall it was felt that SMA stenosis was not contributing to her symptoms.  She went to [facility] for second opinion and they agreed.  She was last seen in clinic on 05/26/2022 by Dr. Theodis Fiscal.  Her blood pressure was low at 102/50.  She had also continued to struggle with lower extremity edema.  Her amlodipine  was discontinued and she was started on spironolactone  12.5 mg daily.  She was to continue with lymphedema pumps.  BNP was ordered which was normal at 62.2.  Echocardiogram was ordered and completed on 05/26/2022 showing LVEF 65 to 70%, no RWMA, grade  1 DD, normal RV function and size, normal PASP, aortic valve sclerosis.  She was admitted 08/04/2023 through 08/08/2023 for recurrent left lower extremity cellulitis.  She was found to be bradycardic.  Her carvedilol  was held at that time, she was to follow-up with her PCP as outpatient.  She was recently admitted to the hospital 10/02/2023 for back pain.  She was found to be hyponatremic.  Repeat sodium after 1L normal saline was 119, fluid intake was restricted to 1L/day and salt tablets were added.  Her torsemide  and Micardis were held.  Her tramadol  was also held as this can be associated with hyponatremia.  Her chronic HFrEF was stable.   Discussed the use of AI scribe software for clinical note transcription with the patient, who gave verbal consent to proceed.  History of Present Illness [name] is a 78 year old female with heart failure who presents for a cardiology follow-up after recent hospital discharge.  Patient presents with her caretaker.  She currently lives at home with her husband.  Her caretaker is with her 40-50 hours/week.  Since she was discharged from the hospital she has had no acute cardiovascular concerns or complaints.  Her caretaker takes her blood pressure and pulse daily.  She notes blood pressure have been consistently less than 140 systolic.  She denies any hypotensive readings.  Pulse has stayed stable and no signs of bradycardia.  She has since seen her PCP who has adjusted some of her medication.  Her torsemide   dose was adjusted for fluid retention and hyponatremia, alternating between 5 mg and 10 mg daily.  Her telmisartan was recently reduced to 40 mg daily.  She continues to remain off of carvedilol .  Her most recent sodium level was 10/04/2023 with a reading of 134.   Review of systems:  Please see the history of present illness. All other systems are reviewed and otherwise negative.      Studies Reviewed:        Echocardiogram 05/26/2022 1.  Estimated LV EF is 65-70%, normal systolic function. No regional wall  motion abnormalities are seen. There is grade I diastolic dysfunction  (impaired relaxation), elevated E/e' ratio suggests high mean left atrial  pressure. Left ventricular ejection  fraction, by estimation, is 65 to 70%. The left ventricle has normal  function. The left ventricle has no regional wall motion abnormalities.  Left ventricular diastolic parameters are consistent with Grade I  diastolic dysfunction (impaired relaxation).  Elevated left atrial pressure.   2. Normal right ventricular size and systolic function. Right ventricular  systolic function is normal. The right ventricular size is normal. There  is normal pulmonary artery systolic pressure. The estimated right  ventricular systolic pressure is 32.4  mmHg.   3. Both atria are normal in size.   4. No pericardial effusion.   5. Mild mitral annular calcification. The mitral valve is normal in  structure. No evidence of mitral valve regurgitation. No evidence of  mitral stenosis.   6. The aortic valve is tricuspid. There is mild calcification of the  aortic valve. Aortic valve regurgitation is not visualized. Aortic valve  sclerosis is present, with no evidence of aortic valve stenosis.   7. The inferior vena cava is normal in size with greater than 50%  respiratory variability, suggesting right atrial pressure of 3 mmHg.  Risk Assessment/Calculations:              Physical Exam:   VS:  BP 130/62 (BP Location: Left Arm, Patient Position: Sitting)   Pulse 70   Ht 5' (1.524 m)   Wt 190 lb (86.2 kg) Comment: Was weighted in the hospital from 10/02/2023-10/05/2023  SpO2 97%   BMI 37.11 kg/m    Wt Readings from Last 3 Encounters:  10/05/23 190 lb (86.2 kg)  10/05/23 193 lb 9 oz (87.8 kg)  08/08/23 212 lb 8.4 oz (96.4 kg)    GEN: Well nourished, well developed in no acute distress NECK: No JVD; No carotid bruits CARDIAC: RRR, no murmurs, rubs,  gallops RESPIRATORY:  Clear to auscultation without rales, wheezing or rhonchi  ABDOMEN: Soft, non-tender, non-distended EXTREMITIES: 1+ bilateral lower extremity edema; No acute deformity      Assessment and Plan:  HFpEF Takotsubo cardiomyopathy Recovered EF per echocardiogram 05/2022 with LVEF 65-70%, no RWMA, grade 1 DD Admitted 3/25 with acute on chronic diastolic dysfunction and IV diuresed at that time with net -2 L Her torsemide  was recently held during admission 09/2023 due to hyponatremia.  However restarted by PCP per caregiver. - Today patient is euvolemic on exam.  No SOB, orthopnea, PND.  Overall stable symptoms.  No increase in chronic bilateral lower extremity swelling - Discharge weight 87.8 kg and 86.2 kg today.  Overall weight remains stable - Continue torsemide  5 mg alternating with 10 mg every other day  Sinus bradycardia Experienced bradycardia during admission 07/2023 and carvedilol  was subsequently discontinued at that time - Her heart rate today is 70 without symptoms concerning for bradycardia - Currently well-controlled.  Will continue to clinically monitor - Avoid AV nodal blocking agents in the future  Hyponatremia Per admission notes "Unclear etiology, but presumed secondary to SIADH " She has low solute/high tea diet. Currently working on diet changes with caregiver  Sodium 134 on 10/04/2023 and 119 on admission - Remains stable.  Managed by PCP  Coronary artery disease 08/2016 coronary artery calcium  score 56 revealing nonobstructive coronary disease. - Today patient is without any anginal symptoms, no indication for further ischemic evaluation at this time  - Continue aspirin  81 mg daily and atorvastatin  40 mg daily  Hypertension Blood pressure today 130/62 and adequately controlled - Continue amlodipine  5 mg daily and telmisartan 40 mg daily  Lower extremity edema Her lower extremity edema is chronic.  It is likely related to her being in a chair  constantly.  Overall her swelling is 1+ bilaterally today and well controlled.  Caregiver has been working with getting her legs moving and wearing compression stockings/lymphedema pumps.  Paroxysmal atrial fibrillation Remote atrial fibrillation.  She has not had any clinical atrial fibrillation since that time.  Not currently on anticoagulation.  Patient denies any symptoms concerning for recurrent atrial fibrillation at this time      Dispo:  Return in about 3 months (around 01/05/2024).  Signed, Ava Boatman, NP

## 2023-10-05 NOTE — Plan of Care (Signed)
  Problem: Health Behavior/Discharge Planning: Goal: Ability to manage health-related needs will improve Outcome: Progressing   Problem: Clinical Measurements: Goal: Ability to maintain clinical measurements within normal limits will improve Outcome: Progressing Goal: Will remain free from infection Outcome: Progressing Goal: Diagnostic test results will improve Outcome: Progressing Goal: Respiratory complications will improve Outcome: Progressing Goal: Cardiovascular complication will be avoided Outcome: Progressing   Problem: Activity: Goal: Risk for activity intolerance will decrease Outcome: Progressing   Problem: Nutrition: Goal: Adequate nutrition will be maintained Outcome: Completed/Met   Problem: Coping: Goal: Level of anxiety will decrease Outcome: Progressing   Problem: Elimination: Goal: Will not experience complications related to bowel motility Outcome: Progressing Goal: Will not experience complications related to urinary retention Outcome: Progressing   Problem: Pain Managment: Goal: General experience of comfort will improve and/or be controlled Outcome: Progressing   Problem: Safety: Goal: Ability to remain free from injury will improve Outcome: Progressing   Problem: Skin Integrity: Goal: Risk for impaired skin integrity will decrease Outcome: Progressing

## 2023-10-09 ENCOUNTER — Telehealth: Payer: Self-pay | Admitting: Emergency Medicine

## 2023-10-09 ENCOUNTER — Other Ambulatory Visit: Payer: Self-pay | Admitting: Emergency Medicine

## 2023-10-09 ENCOUNTER — Other Ambulatory Visit: Payer: Self-pay | Admitting: *Deleted

## 2023-10-09 MED ORDER — TORSEMIDE 5 MG PO TABS: 5.0000 mg | ORAL_TABLET | ORAL | 1 refills | Status: DC

## 2023-10-09 MED ORDER — TELMISARTAN 80 MG PO TABS: 40.0000 mg | ORAL_TABLET | Freq: Every day | ORAL | 1 refills | Status: DC

## 2023-10-09 MED ORDER — TELMISARTAN 80 MG PO TABS: 40.0000 mg | ORAL_TABLET | Freq: Every day | ORAL | 5 refills | Status: DC

## 2023-10-09 NOTE — Addendum Note (Signed)
 Addended by: Mickey Alar L on: 10/09/2023 11:07 AM   Modules accepted: Orders

## 2023-10-09 NOTE — Addendum Note (Signed)
 Addended by: Carmelita Ching on: 10/09/2023 10:41 AM   Modules accepted: Orders

## 2023-10-09 NOTE — Addendum Note (Signed)
 Addended by: Carmelita Ching on: 10/09/2023 10:55 AM   Modules accepted: Orders

## 2023-10-09 NOTE — Telephone Encounter (Signed)
 Spoke with pharmacy and they would like clarification on telmisartan. States they have 2 scripts for patient stating 0.5 tablet 40 mg. Then have script for 80 mg alternating 40 mg.   Is patient taking 40 mg daily or 80 mg of telmisartan?

## 2023-10-09 NOTE — Telephone Encounter (Signed)
 Pt c/o medication issue:  1. Name of Medication:   telmisartan (MICARDIS) 80 MG tablet    2. How are you currently taking this medication (dosage and times per day)? As written   3. Are you having a reaction (difficulty breathing--STAT)? No   4. What is your medication issue? Pharmacy called in stating they need clarification on instructions for these medication. She stated the dosage and the directions do not match up. Please advise.

## 2023-10-10 ENCOUNTER — Telehealth: Payer: Self-pay

## 2023-10-10 DIAGNOSIS — E119 Type 2 diabetes mellitus without complications: Secondary | ICD-10-CM

## 2023-10-10 DIAGNOSIS — A414 Sepsis due to anaerobes: Secondary | ICD-10-CM

## 2023-10-10 DIAGNOSIS — I10 Essential (primary) hypertension: Secondary | ICD-10-CM

## 2023-10-10 NOTE — Telephone Encounter (Signed)
 Pharmacy comment: THE NURSE THAT HELPS HER WITH HER MEDICATION PREFERS TO HAVE THIS AS THE 40MG  WITH THE DIRECTIONS OF A WHOLE TABLET SO HE DOESN'T HAVE TO BREAK IT IN 1/2. THANKS.  Please address

## 2023-10-11 NOTE — Telephone Encounter (Signed)
 Spoke with [name] and confirmed patient is taking 40 mg daily. Verbalized understanding

## 2023-10-25 ENCOUNTER — Other Ambulatory Visit: Payer: Self-pay | Admitting: *Deleted

## 2023-10-25 MED ORDER — TELMISARTAN 40 MG PO TABS: 40.0000 mg | ORAL_TABLET | Freq: Every day | ORAL | 1 refills | Status: DC

## 2023-12-14 ENCOUNTER — Other Ambulatory Visit: Payer: Self-pay

## 2023-12-14 MED ORDER — TORSEMIDE 5 MG PO TABS: 5.0000 mg | ORAL_TABLET | ORAL | 2 refills | Status: AC

## 2023-12-17 ENCOUNTER — Telehealth (HOSPITAL_COMMUNITY): Payer: Self-pay

## 2023-12-17 NOTE — Telephone Encounter (Signed)
-----   Message from Ester JINNY Sides sent at 12/17/2023  3:30 PM EDT ----- Regarding: RE: lumbar block-lidocaine  Find to schedule with me at [facility].  And fine to schedule the repeat injections with me without discussion - thanks!  Dylan ----- Message ----- From: Carolee Rosina BIRCH Sent: 12/17/2023   1:53 PM EDT To: Ester JINNY Sides, MD Subject: lumbar block-lidocaine                          Dr. Sides,   Received another order for this patient to have a bilateral L3, L4 medial branch and L5 dorsal ramus block-lidocaine .  Dx: spondylosis without myelopathy or radiculopathy, lumbar region  Ordering: Dr. Charlie Dolores [phone]  You did her last ESI on 12/2022.   She is unable to go to the clinic bc she is not ambulatory.   Please review.   Question? If it is a repeat injection, are you ok with me putting it on or do you want me to still run it by you first?   Thanks,  [employer]

## 2023-12-18 ENCOUNTER — Other Ambulatory Visit (HOSPITAL_COMMUNITY): Payer: Self-pay | Admitting: Physical Medicine and Rehabilitation

## 2023-12-18 DIAGNOSIS — M47816 Spondylosis without myelopathy or radiculopathy, lumbar region: Secondary | ICD-10-CM

## 2023-12-22 ENCOUNTER — Emergency Department (HOSPITAL_COMMUNITY)

## 2023-12-22 ENCOUNTER — Other Ambulatory Visit: Payer: Self-pay

## 2023-12-22 ENCOUNTER — Encounter (HOSPITAL_COMMUNITY): Payer: Self-pay | Admitting: Emergency Medicine

## 2023-12-22 ENCOUNTER — Emergency Department (HOSPITAL_COMMUNITY)
Admission: EM | Admit: 2023-12-22 | Discharge: 2023-12-22 | Disposition: A | Discharge status: Home / Self Care | Attending: Emergency Medicine | Admitting: Emergency Medicine

## 2023-12-22 DIAGNOSIS — Z7982 Long term (current) use of aspirin: Secondary | ICD-10-CM | POA: Insufficient documentation

## 2023-12-22 DIAGNOSIS — J209 Acute bronchitis, unspecified: Secondary | ICD-10-CM | POA: Diagnosis not present

## 2023-12-22 DIAGNOSIS — E876 Hypokalemia: Secondary | ICD-10-CM | POA: Insufficient documentation

## 2023-12-22 DIAGNOSIS — Z79899 Other long term (current) drug therapy: Secondary | ICD-10-CM | POA: Insufficient documentation

## 2023-12-22 DIAGNOSIS — E119 Type 2 diabetes mellitus without complications: Secondary | ICD-10-CM | POA: Diagnosis not present

## 2023-12-22 DIAGNOSIS — R7401 Elevation of levels of liver transaminase levels: Secondary | ICD-10-CM | POA: Diagnosis not present

## 2023-12-22 DIAGNOSIS — Z7984 Long term (current) use of oral hypoglycemic drugs: Secondary | ICD-10-CM | POA: Diagnosis not present

## 2023-12-22 DIAGNOSIS — I110 Hypertensive heart disease with heart failure: Secondary | ICD-10-CM | POA: Insufficient documentation

## 2023-12-22 DIAGNOSIS — I509 Heart failure, unspecified: Secondary | ICD-10-CM | POA: Insufficient documentation

## 2023-12-22 DIAGNOSIS — R0602 Shortness of breath: Secondary | ICD-10-CM | POA: Diagnosis present

## 2023-12-22 DIAGNOSIS — I2510 Atherosclerotic heart disease of native coronary artery without angina pectoris: Secondary | ICD-10-CM | POA: Insufficient documentation

## 2023-12-22 LAB — CBC WITH DIFFERENTIAL/PLATELET
Abs Immature Granulocytes: 0.04 K/uL (ref 0.00–0.07)
Basophils Absolute: 0.1 K/uL (ref 0.0–0.1)
Basophils Relative: 1 %
Eosinophils Absolute: 0.7 K/uL — ABNORMAL HIGH (ref 0.0–0.5)
Eosinophils Relative: 7 %
HCT: 41.1 % (ref 36.0–46.0)
Hemoglobin: 13.5 g/dL (ref 12.0–15.0)
Immature Granulocytes: 0 %
Lymphocytes Relative: 30 %
Lymphs Abs: 3 K/uL (ref 0.7–4.0)
MCH: 30.9 pg (ref 26.0–34.0)
MCHC: 32.8 g/dL (ref 30.0–36.0)
MCV: 94.1 fL (ref 80.0–100.0)
Monocytes Absolute: 1.1 K/uL — ABNORMAL HIGH (ref 0.1–1.0)
Monocytes Relative: 11 %
Neutro Abs: 5 K/uL (ref 1.7–7.7)
Neutrophils Relative %: 51 %
Platelets: 376 K/uL (ref 150–400)
RBC: 4.37 MIL/uL (ref 3.87–5.11)
RDW: 14.5 % (ref 11.5–15.5)
WBC: 9.8 K/uL (ref 4.0–10.5)
nRBC: 0 % (ref 0.0–0.2)

## 2023-12-22 LAB — COMPREHENSIVE METABOLIC PANEL WITH GFR
ALT: 54 U/L — ABNORMAL HIGH (ref 0–44)
AST: 46 U/L — ABNORMAL HIGH (ref 15–41)
Albumin: 3.2 g/dL — ABNORMAL LOW (ref 3.5–5.0)
Alkaline Phosphatase: 65 U/L (ref 38–126)
Anion gap: 12 (ref 5–15)
BUN: 8 mg/dL (ref 8–23)
CO2: 26 mmol/L (ref 22–32)
Calcium: 9.2 mg/dL (ref 8.9–10.3)
Chloride: 98 mmol/L (ref 98–111)
Creatinine, Ser: 0.4 mg/dL — ABNORMAL LOW (ref 0.44–1.00)
GFR, Estimated: >60 mL/min (ref >60)
Glucose, Bld: 162 mg/dL — ABNORMAL HIGH (ref 70–99)
Potassium: 3.2 mmol/L — ABNORMAL LOW (ref 3.5–5.1)
Sodium: 136 mmol/L (ref 135–145)
Total Bilirubin: 0.9 mg/dL (ref 0.0–1.2)
Total Protein: 6.9 g/dL (ref 6.5–8.1)

## 2023-12-22 LAB — BRAIN NATRIURETIC PEPTIDE: B Natriuretic Peptide: 20.7 pg/mL (ref 0.0–100.0)

## 2023-12-22 LAB — TROPONIN I (HIGH SENSITIVITY): Troponin I (High Sensitivity): 4 ng/L (ref <18)

## 2023-12-22 MED ORDER — IPRATROPIUM-ALBUTEROL 0.5-2.5 (3) MG/3ML IN SOLN
3.0000 mL | Freq: Once | RESPIRATORY_TRACT | Status: AC
  Administered 2023-12-22: 3 mL via RESPIRATORY_TRACT
  Filled 2023-12-22: qty 3

## 2023-12-22 MED ORDER — ALBUTEROL SULFATE HFA 108 (90 BASE) MCG/ACT IN AERS
INHALATION_SPRAY | RESPIRATORY_TRACT | Status: AC
  Filled 2023-12-22: qty 6.7

## 2023-12-22 MED ORDER — ALBUTEROL SULFATE HFA 108 (90 BASE) MCG/ACT IN AERS
2.0000 | INHALATION_SPRAY | Freq: Once | RESPIRATORY_TRACT | Status: AC
  Administered 2023-12-22: 2 via RESPIRATORY_TRACT
  Filled 2023-12-22: qty 6.7

## 2023-12-22 MED ORDER — PREDNISONE 20 MG PO TABS: ORAL_TABLET | ORAL | 0 refills | Status: DC

## 2023-12-22 MED ORDER — PREDNISONE 20 MG PO TABS
60.0000 mg | ORAL_TABLET | Freq: Once | ORAL | Status: AC
  Administered 2023-12-22: 60 mg via ORAL
  Filled 2023-12-22: qty 3

## 2023-12-22 MED ORDER — POTASSIUM CHLORIDE CRYS ER 20 MEQ PO TBCR: 20.0000 meq | EXTENDED_RELEASE_TABLET | Freq: Every day | ORAL | 0 refills | Status: DC

## 2023-12-22 MED ORDER — POTASSIUM CHLORIDE CRYS ER 20 MEQ PO TBCR
40.0000 meq | EXTENDED_RELEASE_TABLET | Freq: Once | ORAL | Status: AC
  Administered 2023-12-22: 40 meq via ORAL
  Filled 2023-12-22: qty 2

## 2023-12-22 MED ORDER — AMLODIPINE BESYLATE 5 MG PO TABS
5.0000 mg | ORAL_TABLET | Freq: Every day | ORAL | Status: DC
  Administered 2023-12-22: 5 mg via ORAL
  Filled 2023-12-22: qty 1

## 2023-12-22 NOTE — ED Provider Notes (Signed)
 [place] EMERGENCY DEPARTMENT AT [facility] Provider Note   CSN: [id number] Arrival date & time: 12/22/23  [id number]     Patient presents with: Shortness of Breath   [name] is a 78 y.o. female.   The history is provided by the patient.  Shortness of Breath  She has history of hypertension, diabetes, hyperlipidemia, coronary artery disease, heart failure, GERD, postpolio syndrome and comes in because of shortness of breath this morning.  She has had a nonproductive cough for the last month, but did not get short of breath until this morning.  She denies chest pain, heaviness, tightness, pressure.  She denies fever, chills, sweats.  She had been given a prescription for azithromycin which has not given her any relief.    Prior to Admission medications   Medication Sig Start Date End Date Taking? Authorizing Provider  amLODipine  (NORVASC ) 5 MG tablet Take 1 tablet (5 mg total) by mouth daily. 10/05/23 10/04/24  Briana Elgin LABOR, MD  aspirin  EC 81 MG tablet Take 81 mg by mouth every evening. Swallow whole.    [provider]  atorvastatin  (LIPITOR) 40 MG tablet Take 40 mg by mouth at bedtime.    [provider]  B Complex Vitamins (VITAMIN B COMPLEX W/B-12 PO) Take 1 tablet by mouth daily. 07/09/23   [provider]  Calcium  Carb-Cholecalciferol (CALCIUM  600+D) 600-20 MG-MCG TABS Take 1 tablet by mouth 2 (two) times daily.    [provider]  cholecalciferol (VITAMIN D3) 25 MCG (1000 UNIT) tablet Take 1,000 Units by mouth daily.    [provider]  clonazePAM  (KLONOPIN ) 0.5 MG tablet Take 0.5 mg by mouth 2 (two) times daily as needed for anxiety.    [provider]  FEROSUL 325 (65 Fe) MG tablet Take 325 mg by mouth daily. 07/09/23   [provider]  gabapentin  (NEURONTIN ) 100 MG capsule Take 100 mg by mouth 2 (two) times daily.    [provider]  levothyroxine  (SYNTHROID , LEVOTHROID) 100 MCG tablet Take 100  mcg by mouth daily before breakfast.    [provider]  loratadine  (CLARITIN ) 10 MG tablet Take 10 mg by mouth daily as needed for allergies. Patient not taking: Reported on 10/05/2023 12/17/19   [provider]  magnesium  chloride (SLOW-MAG) 64 MG TBEC SR tablet Take 1 tablet by mouth daily.    [provider]  metFORMIN  (GLUCOPHAGE -XR) 500 MG 24 hr tablet Take 500 mg by mouth every evening. 08/02/23   [provider]  mirabegron  ER (MYRBETRIQ ) 50 MG TB24 tablet Take 50 mg by mouth daily.    [provider]  Multiple Vitamins-Minerals (MULTIVITAMIN WITH MINERALS) tablet Take 1 tablet by mouth daily.    [provider]  mupirocin ointment (BACTROBAN) 2 % Apply 1 Application topically as needed. Patient not taking: Reported on 10/05/2023 04/23/23   [provider]  pantoprazole  (PROTONIX ) 40 MG tablet Take 1 tablet by mouth daily. Take 1 tab daily 12/21/14   [provider]  telmisartan  (MICARDIS ) 40 MG tablet TAKE ONE TABLET BY MOUTH DAILY 11/05/23   Rana Dixon L, NP  telmisartan  (MICARDIS ) 40 MG tablet Take 1 tablet (40 mg total) by mouth daily. 10/25/23   Rana Dixon CROME, NP  tiZANidine  (ZANAFLEX ) 4 MG capsule Take 4 mg by mouth daily at 6 (six) AM.    [provider]  tiZANidine  (ZANAFLEX ) 4 MG tablet Take 4 mg by mouth every 6 (six) hours as needed for muscle  spasms. PAIN    [provider]  torsemide  (DEMADEX ) 5 MG tablet Take 1 tablet (5 mg total) by mouth every other day. TAKE 5 MG (1 TABLET) ONE DAY ALTERNATING WITH 10 MG (2 TABLETS) THE NEXT. 12/14/23   Rana Lum CROME, NP  vitamin E 180 MG (400 UNITS) capsule Take 400 Units by mouth daily.    [provider]  Zinc  50 MG TABS Take 50 mg by mouth every evening.    [provider]  zolpidem  (AMBIEN ) 5 MG tablet Take 5 mg by mouth at bedtime as needed for sleep.    [provider]    Allergies: Memantine , Tamsulosin ,  Ciprofloxacin, Carisoprodol, Oxycodone -acetaminophen , Penicillins, Sulfa antibiotics, Aspirin , Macrodantin [nitrofurantoin macrocrystal], Nsaids, Percocet [oxycodone -acetaminophen ], Propoxyphene, Propoxyphene n-acetaminophen , and Tylenol  [acetaminophen ]    Review of Systems  Respiratory:  Positive for shortness of breath.   All other systems reviewed and are negative.   Updated Vital Signs There were no vitals taken for this visit.  Physical Exam Vitals and nursing note reviewed.   78 year old female, resting comfortably and in no acute distress. Vital signs are normal. Oxygen  saturation is 94%, which is normal. Head is normocephalic and atraumatic. PERRLA, EOMI. Neck is nontender and supple without adenopathy or JVD. Back is nontender.  There is no presacral edema. Lungs have fine bibasilar rales and coarse expiratory wheezes diffusely. Chest is nontender. Heart has regular rate and rhythm without murmur. Abdomen is soft, flat, nontender. Extremities: Atrophy of the right leg secondary to polio, no edema of either leg. Skin is warm and dry without rash. Neurologic: Awake and alert, moves all extremities equally.  (all labs ordered are listed, but only abnormal results are displayed) Labs Reviewed  COMPREHENSIVE METABOLIC PANEL WITH GFR - Abnormal; Notable for the following components:      Result Value   Potassium 3.2 (*)    Glucose, Bld 162 (*)    Creatinine, Ser 0.40 (*)    Albumin 3.2 (*)    AST 46 (*)    ALT 54 (*)    All other components within normal limits  CBC WITH DIFFERENTIAL/PLATELET - Abnormal; Notable for the following components:   Monocytes Absolute 1.1 (*)    Eosinophils Absolute 0.7 (*)    All other components within normal limits  BRAIN NATRIURETIC PEPTIDE  TROPONIN I (HIGH SENSITIVITY)    EKG: EKG Interpretation Date/Time:  Saturday December 22 2023 06:36:39 EDT Ventricular Rate:  79 PR Interval:  146 QRS Duration:  84 QT Interval:  399 QTC  Calculation: 458 R Axis:   3  Text Interpretation: Sinus rhythm Anteroseptal infarct, age indeterminate When compared with ECG of 10/03/2023, No significant change was found Confirmed by Raford Lenis ([id number]) on 12/22/2023 6:46:23 AM  Radiology: [facility] Chest Port 1 View Result Date: 12/22/2023 CLINICAL DATA:  Coughing and shortness of breath. EXAM: PORTABLE CHEST 1 VIEW COMPARISON:  Portable chest 10/03/2023 FINDINGS: The heart size and mediastinal contours are within normal limits. There is calcification in the aortic arch. Both lungs are clear with mild chronic elevation of the right diaphragm. The visualized skeletal structures are intact, with bilateral moderate to advanced shoulder DJD. IMPRESSION: No active disease. Aortic atherosclerosis. Electronically Signed   By: Francis Quam M.D.   On: 12/22/2023 06:54     Procedures   Medications Ordered in the ED  ipratropium-albuterol  (DUONEB) 0.5-2.5 (3) MG/3ML nebulizer solution 3 mL (has no administration in time range)  amLODipine  (NORVASC ) tablet 5 mg (has no administration  in time range)  predniSONE  (DELTASONE ) tablet 60 mg (has no administration in time range)  potassium chloride  SA (KLOR-CON  M) CR tablet 40 mEq (has no administration in time range)  ipratropium-albuterol  (DUONEB) 0.5-2.5 (3) MG/3ML nebulizer solution 3 mL (3 mLs Nebulization Given 12/22/23 [id number])                                    Medical Decision Making Amount and/or Complexity of Data Reviewed Labs: ordered. Radiology: ordered.  Risk Prescription drug management.   Nonproductive cough with dyspnea.  This is a presentation with a wide range of treatment options and carries with a high risk of morbidity and complications.  Differential diagnosis includes, but is not limited to, acute bronchitis, pneumonia, ACS, heart failure.  I have ordered chest x-ray, electrocardiogram, screening labs and I have ordered a therapeutic trial of albuterol  with ipratropium.  I have  reviewed her past records, and she has no relevant past visits.  I have reviewed her laboratory tests, and my interpretation is mild hypokalemia, minimal elevation of transaminase is not felt to be clinically significant, normal CBC, normal BMP, normal troponin.  No evidence of ACS or heart failure.  Chest x-ray shows no active disease.  I independently viewed the image, and agree with the radiologist's interpretation.  I have reevaluated the patient, and she has improved airflow but still has considerable wheezing, will need additional nebulizer treatments.  I have ordered a second nebulizer treatment and a dose of prednisone .  Case is signed out to Dr. Freddi.     Final diagnoses:  Acute bronchitis, unspecified organism  Hypokalemia  Elevated transaminase level    ED Discharge Orders     None          Raford Lenis, MD 12/22/23 [phone]

## 2023-12-22 NOTE — ED Triage Notes (Signed)
 BIBA from home, c/o [facility] onset ago, has had productive cough x1 month. Denies fever. Was prescribed a Z-pak for symptoms, no improvement. Pt stated sickness has been present in the household.

## 2023-12-22 NOTE — ED Provider Notes (Signed)
 Care transferred to me.  Patient is feeling better after the second neb.  She still has a little bit of wheezing on exam but has normal work of breathing, O2 sats are 97% in the room, and she feels well for discharge.  Likely has some degree of bronchitis and has been having this cough for a month.  Given she symptomatically feels better I think it is reasonable to discharge her home with an albuterol  inhaler and will also continue the steroids started in the emergency department for a burst over the next 4 days.  Will also replace her potassium.  She already has been on azithromycin, I do not think further antibiotics are warranted given no clear evidence of pneumonia.  Will discharge her to follow-up closely with PCP.  Given return precautions.   Freddi Hamilton, MD 12/22/23 [phone]

## 2023-12-22 NOTE — Discharge Instructions (Addendum)
 Use the albuterol  inhaler 1-2 puffs every 4-6 hours for cough, wheezing or shortness of breath.  We are also putting you on steroids, start the prednisone  prescription tomorrow.  We are also supplementing your mildly low potassium, start that prescription tomorrow as well.  If you develop new or worsening shortness of breath, coughing up blood, fever, or any other new/concerning symptoms then return to the ER.  Otherwise call your doctor on Monday to set up an urgent outpatient follow-up appointment.

## 2023-12-26 ENCOUNTER — Other Ambulatory Visit (HOSPITAL_COMMUNITY): Payer: Self-pay | Admitting: Physical Medicine and Rehabilitation

## 2023-12-26 ENCOUNTER — Ambulatory Visit (HOSPITAL_COMMUNITY)
Admission: RE | Admit: 2023-12-26 | Discharge: 2023-12-26 | Disposition: A | Discharge status: Home / Self Care | Source: Ambulatory Visit | Attending: Physical Medicine and Rehabilitation | Admitting: Physical Medicine and Rehabilitation

## 2023-12-26 DIAGNOSIS — M47816 Spondylosis without myelopathy or radiculopathy, lumbar region: Secondary | ICD-10-CM | POA: Insufficient documentation

## 2023-12-26 HISTORY — PX: IR INJECT/THERA/INC NEEDLE/CATH/PLC EPI/LUMB/SAC W/IMG: IMG6130

## 2023-12-26 HISTORY — PX: IR EPIDUROGRAPHY: IMG2365

## 2023-12-26 MED ORDER — IOHEXOL 180 MG/ML  SOLN
INTRAMUSCULAR | Status: AC
Start: 2023-12-26 — End: 2023-12-26
  Filled 2023-12-26: qty 10

## 2023-12-26 MED ORDER — LIDOCAINE HCL (PF) 1 % IJ SOLN
INTRAMUSCULAR | Status: AC
  Filled 2023-12-26: qty 30

## 2023-12-26 MED ORDER — LIDOCAINE HCL (PF) 1 % IJ SOLN
30.0000 mL | Freq: Once | INTRAMUSCULAR | Status: AC
  Administered 2023-12-26 (×2): 20 mL via INTRADERMAL

## 2023-12-26 MED ORDER — BUPIVACAINE HCL (PF) 0.5 % IJ SOLN
30.0000 mL | Freq: Once | INTRAMUSCULAR | Status: AC
  Administered 2023-12-26 (×2): 12 mL

## 2023-12-26 MED ORDER — METHYLPREDNISOLONE ACETATE 80 MG/ML IJ SUSP
INTRAMUSCULAR | Status: AC
  Filled 2023-12-26: qty 1

## 2023-12-26 MED ORDER — BUPIVACAINE HCL (PF) 0.5 % IJ SOLN
INTRAMUSCULAR | Status: AC
  Filled 2023-12-26: qty 30

## 2024-02-22 ENCOUNTER — Encounter: Payer: Self-pay | Admitting: *Deleted

## 2024-02-26 ENCOUNTER — Ambulatory Visit (INDEPENDENT_AMBULATORY_CARE_PROVIDER_SITE_OTHER): Admitting: Cardiovascular Disease

## 2024-02-26 ENCOUNTER — Encounter (HOSPITAL_BASED_OUTPATIENT_CLINIC_OR_DEPARTMENT_OTHER): Payer: Self-pay | Admitting: Cardiovascular Disease

## 2024-02-26 VITALS — BP 144/70 | HR 69 | Ht 60.0 in | Wt 199.8 lb

## 2024-02-26 DIAGNOSIS — E7800 Pure hypercholesterolemia, unspecified: Secondary | ICD-10-CM | POA: Diagnosis not present

## 2024-02-26 DIAGNOSIS — I10 Essential (primary) hypertension: Secondary | ICD-10-CM

## 2024-02-26 DIAGNOSIS — I2510 Atherosclerotic heart disease of native coronary artery without angina pectoris: Secondary | ICD-10-CM | POA: Diagnosis not present
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
## 2024-02-26 NOTE — Patient Instructions (Signed)
 Medication Instructions:  No changes *If you need a refill on your cardiac medications before your next appointment, please call your pharmacy*  Lab Work: Today: lipids, cmet  Testing/Procedures: none  Follow-Up: At [facility], you and your health needs are our priority.  As part of our continuing mission to provide you with exceptional heart care, our providers are all part of one team.  This team includes your primary Cardiologist (physician) and Advanced Practice Providers or APPs (Physician Assistants and Nurse Practitioners) who all work together to provide you with the care you need, when you need it.  Your next appointment:   6 month(s)  Provider:   Annabella Scarce, MD, Rosaline Bane, NP, or Reche Finder, NP

## 2024-02-27 ENCOUNTER — Encounter (HOSPITAL_BASED_OUTPATIENT_CLINIC_OR_DEPARTMENT_OTHER): Payer: Self-pay | Admitting: Cardiovascular Disease

## 2024-02-27 LAB — COMPREHENSIVE METABOLIC PANEL WITH GFR
ALT: 68 IU/L — ABNORMAL HIGH (ref 0–32)
AST: 77 IU/L — ABNORMAL HIGH (ref 0–40)
Albumin: 4.2 g/dL (ref 3.8–4.8)
Alkaline Phosphatase: 93 IU/L (ref 49–135)
BUN/Creatinine Ratio: 17 (ref 12–28)
BUN: 7 mg/dL — ABNORMAL LOW (ref 8–27)
Bilirubin Total: 0.7 mg/dL (ref 0.0–1.2)
CO2: 24 mmol/L (ref 20–29)
Calcium: 10.2 mg/dL (ref 8.7–10.3)
Chloride: 97 mmol/L (ref 96–106)
Creatinine, Ser: 0.41 mg/dL — ABNORMAL LOW (ref 0.57–1.00)
Globulin, Total: 2.9 g/dL (ref 1.5–4.5)
Glucose: 169 mg/dL — ABNORMAL HIGH (ref 70–99)
Potassium: 4.6 mmol/L (ref 3.5–5.2)
Sodium: 138 mmol/L (ref 134–144)
Total Protein: 7.1 g/dL (ref 6.0–8.5)
eGFR: 99 mL/min/1.73 (ref >59)

## 2024-02-27 LAB — LIPID PANEL
Chol/HDL Ratio: 2.8 ratio (ref 0.0–4.4)
Cholesterol, Total: 105 mg/dL (ref 100–199)
HDL: 37 mg/dL — ABNORMAL LOW (ref >39)
LDL Chol Calc (NIH): 44 mg/dL (ref 0–99)
Triglycerides: 135 mg/dL (ref 0–149)
VLDL Cholesterol Cal: 24 mg/dL (ref 5–40)

## 2024-03-11 ENCOUNTER — Other Ambulatory Visit (HOSPITAL_COMMUNITY): Payer: Self-pay | Admitting: Family Medicine

## 2024-03-11 ENCOUNTER — Encounter: Payer: Self-pay | Admitting: *Deleted

## 2024-03-11 DIAGNOSIS — M546 Pain in thoracic spine: Secondary | ICD-10-CM

## 2024-03-16 ENCOUNTER — Other Ambulatory Visit: Payer: Self-pay

## 2024-03-16 ENCOUNTER — Encounter (HOSPITAL_COMMUNITY): Payer: Self-pay | Admitting: Emergency Medicine

## 2024-03-16 ENCOUNTER — Emergency Department (HOSPITAL_COMMUNITY)
Admission: EM | Admit: 2024-03-16 | Discharge: 2024-03-17 | Disposition: A | Discharge status: Home / Self Care | Attending: Emergency Medicine | Admitting: Emergency Medicine

## 2024-03-16 DIAGNOSIS — J45909 Unspecified asthma, uncomplicated: Secondary | ICD-10-CM | POA: Insufficient documentation

## 2024-03-16 DIAGNOSIS — E119 Type 2 diabetes mellitus without complications: Secondary | ICD-10-CM | POA: Diagnosis not present

## 2024-03-16 DIAGNOSIS — I509 Heart failure, unspecified: Secondary | ICD-10-CM | POA: Diagnosis not present

## 2024-03-16 DIAGNOSIS — I110 Hypertensive heart disease with heart failure: Secondary | ICD-10-CM | POA: Diagnosis not present

## 2024-03-16 DIAGNOSIS — K429 Umbilical hernia without obstruction or gangrene: Secondary | ICD-10-CM | POA: Diagnosis not present

## 2024-03-16 DIAGNOSIS — E039 Hypothyroidism, unspecified: Secondary | ICD-10-CM | POA: Insufficient documentation

## 2024-03-16 DIAGNOSIS — Z87442 Personal history of urinary calculi: Secondary | ICD-10-CM | POA: Diagnosis not present

## 2024-03-16 DIAGNOSIS — Z4800 Encounter for change or removal of nonsurgical wound dressing: Secondary | ICD-10-CM | POA: Insufficient documentation

## 2024-03-16 DIAGNOSIS — Z5189 Encounter for other specified aftercare: Secondary | ICD-10-CM

## 2024-03-16 DIAGNOSIS — K5730 Diverticulosis of large intestine without perforation or abscess without bleeding: Secondary | ICD-10-CM | POA: Diagnosis not present

## 2024-03-16 NOTE — ED Triage Notes (Signed)
 Pt arrives W/ [facility] from home w/ c/o bedsores on sacral area. Pt is paraplegic. Pain 9/10. Hx polio.  VSS

## 2024-03-17 ENCOUNTER — Emergency Department (HOSPITAL_COMMUNITY)

## 2024-03-17 LAB — COMPREHENSIVE METABOLIC PANEL WITH GFR
ALT: 100 U/L — ABNORMAL HIGH (ref 0–44)
AST: 132 U/L — ABNORMAL HIGH (ref 15–41)
Albumin: 3.2 g/dL — ABNORMAL LOW (ref 3.5–5.0)
Alkaline Phosphatase: 70 U/L (ref 38–126)
Anion gap: 14 (ref 5–15)
BUN: 9 mg/dL (ref 8–23)
CO2: 21 mmol/L — ABNORMAL LOW (ref 22–32)
Calcium: 8.7 mg/dL — ABNORMAL LOW (ref 8.9–10.3)
Chloride: 97 mmol/L — ABNORMAL LOW (ref 98–111)
Creatinine, Ser: 0.74 mg/dL (ref 0.44–1.00)
GFR, Estimated: >60 mL/min (ref >60)
Glucose, Bld: 191 mg/dL — ABNORMAL HIGH (ref 70–99)
Potassium: 4.3 mmol/L (ref 3.5–5.1)
Sodium: 132 mmol/L — ABNORMAL LOW (ref 135–145)
Total Bilirubin: 1.1 mg/dL (ref 0.0–1.2)
Total Protein: 6.6 g/dL (ref 6.5–8.1)

## 2024-03-17 LAB — CBC WITH DIFFERENTIAL/PLATELET
Abs Immature Granulocytes: 0.05 K/uL (ref 0.00–0.07)
Basophils Absolute: 0.1 K/uL (ref 0.0–0.1)
Basophils Relative: 1 %
Eosinophils Absolute: 1.1 K/uL — ABNORMAL HIGH (ref 0.0–0.5)
Eosinophils Relative: 9 %
HCT: 41.7 % (ref 36.0–46.0)
Hemoglobin: 13.9 g/dL (ref 12.0–15.0)
Immature Granulocytes: 0 %
Lymphocytes Relative: 29 %
Lymphs Abs: 3.7 K/uL (ref 0.7–4.0)
MCH: 31.3 pg (ref 26.0–34.0)
MCHC: 33.3 g/dL (ref 30.0–36.0)
MCV: 93.9 fL (ref 80.0–100.0)
Monocytes Absolute: 1.4 K/uL — ABNORMAL HIGH (ref 0.1–1.0)
Monocytes Relative: 11 %
Neutro Abs: 6.5 K/uL (ref 1.7–7.7)
Neutrophils Relative %: 50 %
Platelets: 426 K/uL — ABNORMAL HIGH (ref 150–400)
RBC: 4.44 MIL/uL (ref 3.87–5.11)
RDW: 13.9 % (ref 11.5–15.5)
WBC: 12.8 K/uL — ABNORMAL HIGH (ref 4.0–10.5)
nRBC: 0 % (ref 0.0–0.2)

## 2024-03-17 MED ORDER — DOXYCYCLINE HYCLATE 100 MG PO CAPS: 100.0000 mg | ORAL_CAPSULE | Freq: Two times a day (BID) | ORAL | 0 refills | Status: AC

## 2024-03-17 MED ORDER — IOHEXOL 350 MG/ML SOLN
75.0000 mL | Freq: Once | INTRAVENOUS | Status: AC | PRN
  Administered 2024-03-17: 75 mL via INTRAVENOUS

## 2024-03-17 MED ORDER — FENTANYL CITRATE (PF) 50 MCG/ML IJ SOSY
50.0000 ug | PREFILLED_SYRINGE | Freq: Once | INTRAMUSCULAR | Status: AC
  Administered 2024-03-17: 50 ug via INTRAVENOUS
  Filled 2024-03-17: qty 1

## 2024-03-17 MED ORDER — DOXYCYCLINE HYCLATE 100 MG PO TABS
100.0000 mg | ORAL_TABLET | Freq: Once | ORAL | Status: AC
  Administered 2024-03-17: 100 mg via ORAL
  Filled 2024-03-17: qty 1

## 2024-03-17 NOTE — ED Notes (Signed)
 Pt was wet as purewick had become displaced.  Pt cleaned up and placed in dry depend after cleaning buttock. Applied barrier cream and positioned slightly to the side to relieve comfort.

## 2024-03-17 NOTE — ED Notes (Signed)
 Pt was wet as she had been incontinent of urine on the way here.  Pt cleaned up, picture of buttock with moisture injury placed on chart (see images) applied barrier cream and purewick.  Pt expresses that she is more comfortable

## 2024-03-17 NOTE — Discharge Instructions (Signed)
 You were evaluated in the Emergency Department and after careful evaluation, we did not find any emergent condition requiring admission or further testing in the hospital.  Your exam/testing today is overall reassuring.  Recommend use of the doxycycline  antibiotic to treat for any skin infection.  Important you follow-up with your primary care doctor to discuss wound management as well as physical therapy.  Please return to the Emergency Department if you experience any worsening of your condition.   Thank you for allowing us  to be a part of your care.

## 2024-03-17 NOTE — ED Notes (Signed)
 Pt daughter at bedside

## 2024-03-17 NOTE — ED Notes (Addendum)
 Call for [facility] to transport pt back to her home.

## 2024-03-17 NOTE — ED Notes (Signed)
 Pt called out due to disorientation in bed. Upon entering pt had pulled out IV and purewick suction tubing and was very tearful. Pt reassured she was safe and IV site cleaned. Pt made aware [facility] would be taking her home.

## 2024-03-17 NOTE — ED Notes (Addendum)
 Please call pt daughter Johnna [phone] once [facility] is here to bring pt home.

## 2024-03-17 NOTE — ED Notes (Signed)
 [facility] has arrived to transport pt home.  Call to pt daughter to advise that they are coming.

## 2024-03-17 NOTE — ED Provider Notes (Signed)
 [facility]PT [facility] Emergency Department Provider Note MRN:  [id number]  Arrival date & time: 03/17/24     Chief Complaint   Wound Check   History of Present Illness   [name] is a 78 y.o. year-old female with a history of CHF, polio presenting to the ED with chief complaint of wound check.  Increased pain to the buttocks over the past several days.  History of polio, paraplegia, concerned about pressure sores.  Denies fever.  No other pain.  Review of Systems  A thorough review of systems was obtained and all systems are negative except as noted in the HPI and PMH.   Patient's Health History    Past Medical History:  Diagnosis Date   Angina pectoris 08/02/2016   Anxiety    Arthritis    hands, knees, back (07/18/2013)   Asthma    CHF (congestive heart failure) (HCC)    Chronic back pain    Chronic lower back pain    Family history of anesthesia complication    Mother and Sister- N/V   GERD (gastroesophageal reflux disease)    H/O hiatal hernia    Headache(784.0)    weekly (07/18/2013)   Hypertension    Hypothyroidism    Insomnia    Kidney stones    Memory changes    Mental disorder    Migraine    years ago (07/18/2013)   Osteopenia    Post-polio syndrome (HCC)    Pure hypercholesterolemia 02/07/2021   Sleep apnea    Takotsubo cardiomyopathy    Type II diabetes mellitus (HCC)    Ulcer of leg, chronic (HCC) 02/07/2021    Past Surgical History:  Procedure Laterality Date   ABDOMINAL HYSTERECTOMY     APPENDECTOMY     CARPAL TUNNEL RELEASE Bilateral    CHOLECYSTECTOMY     COLONOSCOPY     CYSTOSCOPY     IR EPIDUROGRAPHY  12/26/2023   IR EPIDUROGRAPHY  12/26/2023   IR INJECT/THERA/INC NEEDLE/CATH/PLC EPI/LUMB/SAC W/IMG  07/19/2022   IR INJECT/THERA/INC NEEDLE/CATH/PLC EPI/LUMB/SAC W/IMG  09/11/2022   IR INJECT/THERA/INC NEEDLE/CATH/PLC EPI/LUMB/SAC W/IMG  12/26/2022   IR INJECT/THERA/INC NEEDLE/CATH/PLC EPI/LUMB/SAC W/IMG  12/26/2023   KNEE  ARTHROSCOPY Left    KNEE SURGERY Left    LEG SURGERY Left    polio   ORIF PATELLA Left 07/18/2013   Procedure: OPEN REDUCTION INTERNAL (ORIF) LEFT FIXATION PATELLA;  Surgeon: Elspeth JONELLE Her, MD;  Location: [facility] OR;  Service: Orthopedics;  Laterality: Left;   ORIF PATELLA FRACTURE Left 07/18/2013   TUBAL LIGATION     WISDOM TOOTH EXTRACTION     WRIST TENDON TRANSFER Right     Family History  Problem Relation Age of Onset   Heart failure Mother    Parkinson's disease Mother    Diabetes Father    Cancer Father    Heart failure Father    Breast cancer Maternal Grandmother    Allergies Grandchild     Social History   Socioeconomic History   Marital status: Married    Spouse name: Not on file   Number of children: Not on file   Years of education: Not on file   Highest education level: Not on file  Occupational History   Occupation: Retired   Tobacco Use   Smoking status: Never   Smokeless tobacco: Never  Vaping Use   Vaping status: Never Used  Substance and Sexual Activity   Alcohol use: No   Drug use: No   Sexual activity:  Never  Other Topics Concern   Not on file  Social History Narrative   Not on file   Social Drivers of Health   Financial Resource Strain: Not on file  Food Insecurity: No Food Insecurity (10/03/2023)   Hunger Vital Sign    Worried About Running Out of Food in the Last Year: Never true    Ran Out of Food in the Last Year: Never true  Transportation Needs: No Transportation Needs (10/03/2023)   PRAPARE - Administrator, Civil Service (Medical): No    Lack of Transportation (Non-Medical): No  Physical Activity: Not on file  Stress: Not on file  Social Connections: Socially Integrated (10/03/2023)   Social Connection and Isolation Panel    Frequency of Communication with Friends and Family: More than three times a week    Frequency of Social Gatherings with Friends and Family: Twice a week    Attends Religious Services: 1 to 4 times per year     Active Member of [employer] or Organizations: No    Attends Banker Meetings: 1 to 4 times per year    Marital Status: Married  Catering Manager Violence: Not At Risk (10/03/2023)   Humiliation, Afraid, Rape, and Kick questionnaire    Fear of Current or Ex-Partner: No    Emotionally Abused: No    Physically Abused: No    Sexually Abused: No     Physical Exam   Vitals:   03/17/24 0257 03/17/24 0300  BP: (!) 125/57 116/87  Pulse: 69 69  Resp: 17   Temp: (!) 97.5 F (36.4 C)   SpO2: 100% 100%    CONSTITUTIONAL: Well-appearing, NAD NEURO/PSYCH:  Alert and oriented x 3, no focal deficits EYES:  eyes equal and reactive ENT/NECK:  no LAD, no JVD CARDIO: Regular rate, well-perfused, normal S1 and S2 PULM:  CTAB no wheezing or rhonchi GI/GU:  non-distended, non-tender MSK/SPINE:  No gross deformities, no edema SKIN:  no rash, atraumatic   *Additional and/or pertinent findings included in MDM below  Diagnostic and Interventional Summary    EKG Interpretation Date/Time:    Ventricular Rate:    PR Interval:    QRS Duration:    QT Interval:    QTC Calculation:   R Axis:      Text Interpretation:         Labs Reviewed  COMPREHENSIVE METABOLIC PANEL WITH GFR - Abnormal; Notable for the following components:      Result Value   Sodium 132 (*)    Chloride 97 (*)    CO2 21 (*)    Glucose, Bld 191 (*)    Calcium  8.7 (*)    Albumin 3.2 (*)    AST 132 (*)    ALT 100 (*)    All other components within normal limits  CBC WITH DIFFERENTIAL/PLATELET - Abnormal; Notable for the following components:   WBC 12.8 (*)    Platelets 426 (*)    Monocytes Absolute 1.4 (*)    Eosinophils Absolute 1.1 (*)    All other components within normal limits  CBC WITH DIFFERENTIAL/PLATELET    CT PELVIS W CONTRAST  Final Result      Medications  doxycycline  (VIBRA -TABS) tablet 100 mg (has no administration in time range)  fentaNYL  (SUBLIMAZE ) injection 50 mcg (50 mcg  Intravenous Given 03/17/24 0211)  iohexol  (OMNIPAQUE ) 350 MG/ML injection 75 mL (75 mLs Intravenous Contrast Given 03/17/24 0207)     Procedures  /  Critical Care Procedures  ED Course and Medical Decision Making  Initial Impression and Ddx Seems like a pain out of proportion to the exam.  Maybe a grade 1 pressure sore, some mild redness to the skin that would not explain the amount of pain patient is having.  Awaiting CT.  Past medical/surgical history that increases complexity of ED encounter: History of polio, paraplegia  Interpretation of Diagnostics I personally reviewed the Laboratory Testing and my interpretation is as follows: No significant blood count or electrolyte disturbance.  Mild leukocytosis  CT largely unremarkable.  Patient Reassessment and Ultimate Disposition/Management     Patient doing well on reassessment with normal vitals.  No indication for further testing or admission.  Appropriate for discharge.  Patient management required discussion with the following services or consulting groups:  None  Complexity of Problems Addressed Acute illness or injury that poses threat of life of bodily function  Additional Data Reviewed and Analyzed Further history obtained from: Further history from spouse/family member and Prior labs/imaging results  Additional Factors Impacting ED Encounter Risk Consideration of hospitalization  Ozell HERO. Theadore, MD [facility] Emergency Medicine [facility] [email or web address]  Final Clinical Impressions(s) / ED Diagnoses     ICD-10-CM   1. Visit for wound check  Z51.89       ED Discharge Orders          Ordered    doxycycline  (VIBRAMYCIN ) 100 MG capsule  2 times daily        03/17/24 0414             Discharge Instructions Discussed with and Provided to Patient:     Discharge Instructions      You were evaluated in the Emergency Department and after careful evaluation, we did not find any  emergent condition requiring admission or further testing in the hospital.  Your exam/testing today is overall reassuring.  Recommend use of the doxycycline  antibiotic to treat for any skin infection.  Important you follow-up with your primary care doctor to discuss wound management as well as physical therapy.  Please return to the Emergency Department if you experience any worsening of your condition.   Thank you for allowing us  to be a part of your care.       Theadore Ozell HERO, MD 03/17/24 [phone]

## 2024-03-19 ENCOUNTER — Ambulatory Visit (HOSPITAL_COMMUNITY)
Admission: RE | Admit: 2024-03-19 | Discharge: 2024-03-19 | Disposition: A | Discharge status: Home / Self Care | Source: Ambulatory Visit | Attending: Family Medicine | Admitting: Family Medicine

## 2024-03-19 DIAGNOSIS — M419 Scoliosis, unspecified: Secondary | ICD-10-CM | POA: Diagnosis not present

## 2024-03-19 DIAGNOSIS — M546 Pain in thoracic spine: Secondary | ICD-10-CM | POA: Diagnosis present

## 2024-03-19 DIAGNOSIS — M4856XA Collapsed vertebra, not elsewhere classified, lumbar region, initial encounter for fracture: Secondary | ICD-10-CM

## 2024-04-15 ENCOUNTER — Telehealth: Payer: Self-pay | Admitting: Cardiovascular Disease

## 2024-04-15 NOTE — Telephone Encounter (Signed)
 Spoke with daughter and discussed heart failure diagnosis, ok per DPR  Daughter appreciated call back and had no further questions at this time

## 2024-04-15 NOTE — Telephone Encounter (Signed)
 Daughter is  requesting a call back to explain what chronic heart failure is. Daughter says based on chronic heart failure and dementia diagnosis they are recommending beginning hospice care. Per family doctor, this diagnosis was provided during a previous ED visit. Daughter is concerned as she states whenever she goes to see Dr. Raford everything is fine. Please advise.

## 2024-05-23 ENCOUNTER — Other Ambulatory Visit (HOSPITAL_COMMUNITY): Payer: Self-pay | Admitting: Family Medicine

## 2024-05-23 DIAGNOSIS — M47816 Spondylosis without myelopathy or radiculopathy, lumbar region: Secondary | ICD-10-CM

## 2024-05-23 DIAGNOSIS — G14 Postpolio syndrome: Secondary | ICD-10-CM

## 2024-06-03 ENCOUNTER — Ambulatory Visit (HOSPITAL_COMMUNITY)

## 2024-06-10 ENCOUNTER — Other Ambulatory Visit (HOSPITAL_COMMUNITY)

## 2024-06-16 ENCOUNTER — Encounter (HOSPITAL_COMMUNITY): Payer: Self-pay

## 2024-06-16 ENCOUNTER — Ambulatory Visit (HOSPITAL_COMMUNITY)
